# Patient Record
Sex: Female | Born: 1950 | Race: White | Hispanic: No | Marital: Married | Smoking: Never smoker
Health system: Southern US, Community
[De-identification: ages and names within clinical notes are randomized; demographics above are authoritative.]

## PROBLEM LIST (undated history)

## (undated) DIAGNOSIS — K5790 Diverticulosis of intestine, part unspecified, without perforation or abscess without bleeding: Secondary | ICD-10-CM

## (undated) DIAGNOSIS — N814 Uterovaginal prolapse, unspecified: Secondary | ICD-10-CM

## (undated) DIAGNOSIS — Z86010 Personal history of colonic polyps: Secondary | ICD-10-CM

## (undated) DIAGNOSIS — I639 Cerebral infarction, unspecified: Secondary | ICD-10-CM

## (undated) DIAGNOSIS — E7800 Pure hypercholesterolemia, unspecified: Secondary | ICD-10-CM

## (undated) DIAGNOSIS — E119 Type 2 diabetes mellitus without complications: Secondary | ICD-10-CM

## (undated) DIAGNOSIS — K635 Polyp of colon: Secondary | ICD-10-CM

## (undated) DIAGNOSIS — D696 Thrombocytopenia, unspecified: Secondary | ICD-10-CM

## (undated) DIAGNOSIS — D126 Benign neoplasm of colon, unspecified: Secondary | ICD-10-CM

## (undated) DIAGNOSIS — Z860101 Personal history of adenomatous and serrated colon polyps: Secondary | ICD-10-CM

## (undated) HISTORY — PX: DILATION AND CURETTAGE OF UTERUS: SHX78

## (undated) HISTORY — PX: TUBAL LIGATION: SHX77

## (undated) HISTORY — DX: Type 2 diabetes mellitus without complications: E11.9

## (undated) HISTORY — DX: Thrombocytopenia, unspecified: D69.6

## (undated) HISTORY — PX: OTHER SURGICAL HISTORY: SHX169

---
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
## 2004-05-23 ENCOUNTER — Ambulatory Visit: Payer: Self-pay | Admitting: Internal Medicine

## 2004-10-24 ENCOUNTER — Ambulatory Visit: Payer: Self-pay | Admitting: Internal Medicine

## 2004-11-14 ENCOUNTER — Ambulatory Visit: Payer: Self-pay

## 2005-05-28 HISTORY — PX: CHOLECYSTECTOMY: SHX55

## 2005-05-28 HISTORY — PX: FOOT SURGERY: SHX648

## 2005-08-23 ENCOUNTER — Ambulatory Visit: Payer: Self-pay | Admitting: Gastroenterology

## 2005-10-30 ENCOUNTER — Ambulatory Visit: Payer: Self-pay | Admitting: Internal Medicine

## 2006-03-30 ENCOUNTER — Other Ambulatory Visit: Payer: Self-pay

## 2006-03-30 ENCOUNTER — Emergency Department: Payer: Self-pay

## 2006-04-26 ENCOUNTER — Ambulatory Visit: Payer: Self-pay | Admitting: General Surgery

## 2006-11-05 ENCOUNTER — Ambulatory Visit: Payer: Self-pay | Admitting: Internal Medicine

## 2007-05-29 HISTORY — PX: ABDOMINAL HYSTERECTOMY: SHX81

## 2007-06-10 ENCOUNTER — Ambulatory Visit: Payer: Self-pay

## 2007-06-19 ENCOUNTER — Inpatient Hospital Stay: Payer: Self-pay

## 2007-11-07 ENCOUNTER — Ambulatory Visit: Payer: Self-pay | Admitting: Internal Medicine

## 2007-12-15 ENCOUNTER — Ambulatory Visit: Payer: Self-pay | Admitting: Internal Medicine

## 2008-11-19 ENCOUNTER — Ambulatory Visit: Payer: Self-pay | Admitting: Internal Medicine

## 2009-07-25 ENCOUNTER — Ambulatory Visit: Payer: Self-pay | Admitting: Gastroenterology

## 2009-11-23 ENCOUNTER — Ambulatory Visit: Payer: Self-pay | Admitting: Internal Medicine

## 2009-12-05 LAB — HM DIABETES FOOT EXAM

## 2010-01-27 ENCOUNTER — Ambulatory Visit: Payer: Self-pay | Admitting: Internal Medicine

## 2010-12-15 ENCOUNTER — Ambulatory Visit: Payer: Self-pay | Admitting: Internal Medicine

## 2012-03-26 ENCOUNTER — Encounter: Payer: Self-pay | Admitting: Internal Medicine

## 2012-03-26 ENCOUNTER — Ambulatory Visit (INDEPENDENT_AMBULATORY_CARE_PROVIDER_SITE_OTHER): Payer: BC Managed Care – PPO | Admitting: Internal Medicine

## 2012-03-26 VITALS — BP 120/74 | HR 61 | Temp 98.5°F | Ht 65.0 in | Wt 180.0 lb

## 2012-03-26 DIAGNOSIS — Z139 Encounter for screening, unspecified: Secondary | ICD-10-CM

## 2012-03-26 DIAGNOSIS — R5383 Other fatigue: Secondary | ICD-10-CM

## 2012-03-26 DIAGNOSIS — D696 Thrombocytopenia, unspecified: Secondary | ICD-10-CM | POA: Insufficient documentation

## 2012-03-26 DIAGNOSIS — E7800 Pure hypercholesterolemia, unspecified: Secondary | ICD-10-CM | POA: Insufficient documentation

## 2012-03-26 LAB — COMPREHENSIVE METABOLIC PANEL
Albumin: 3.9 g/dL (ref 3.5–5.2)
CO2: 27 mEq/L (ref 19–32)
Calcium: 9.1 mg/dL (ref 8.4–10.5)
Chloride: 107 mEq/L (ref 96–112)
GFR: 93.07 mL/min (ref >60.00)
Glucose, Bld: 89 mg/dL (ref 70–99)
Sodium: 142 mEq/L (ref 135–145)
Total Bilirubin: 0.9 mg/dL (ref 0.3–1.2)
Total Protein: 6.5 g/dL (ref 6.0–8.3)

## 2012-03-26 LAB — CBC WITH DIFFERENTIAL/PLATELET
Eosinophils Relative: 0.4 % (ref 0.0–5.0)
HCT: 40.9 % (ref 36.0–46.0)
Hemoglobin: 13.7 g/dL (ref 12.0–15.0)
Lymphocytes Relative: 27.1 % (ref 12.0–46.0)
Lymphs Abs: 1.3 10*3/uL (ref 0.7–4.0)
Monocytes Relative: 5.7 % (ref 3.0–12.0)
Platelets: 152 10*3/uL (ref 150.0–400.0)
WBC: 4.9 10*3/uL (ref 4.5–10.5)

## 2012-03-26 LAB — TSH: TSH: 0.89 u[IU]/mL (ref 0.35–5.50)

## 2012-03-26 LAB — LIPID PANEL: HDL: 51.6 mg/dL (ref >39.00)

## 2012-03-26 NOTE — Patient Instructions (Signed)
It was nice seeing you today.  I am glad you have been doing well.  I am going to schedule your mammogram.  We will notify you of your lab results once they become available.

## 2012-03-27 ENCOUNTER — Encounter: Payer: Self-pay | Admitting: Internal Medicine

## 2012-03-27 NOTE — Assessment & Plan Note (Signed)
Low cholesterol diet and exercise.  Check lipid panel.   

## 2012-03-27 NOTE — Progress Notes (Signed)
  Subjective:    [name], female    DOB: [birth year], 61 y.o.   MRN: [id number]  HPI 61 year old female with past history of hypercholesterolemia and thrombocytopenia who comes in today for a scheduled follow up.  States she has been doing well.  Staying active.  Has done better eating more protein.  No significant "spells".  Stays active.  No chest pain or tightness with increased activity or exertion.  No nausea or vomiting.  Eating and drinking well.  Bowels stable.  Still with flares.  Due to see Dr Marva Panda next month.   Past Medical History  Diagnosis Date  . Thrombocytopenia     Review of Systems Patient denies any headache, lightheadedness or dizziness.  No chest pain, tightness or palpitations.  No increased shortness of breath, cough or congestion.  No acid reflux.  No nausea or vomiting.  No abdominal pain or cramping.  Bowels stable.  No urine change.        Objective:   Physical Exam Filed Vitals:   03/26/12 0904  BP: 120/74  Pulse: 61  Temp: 98.5 F (61.26 C)   61 year old female in no acute distress.   HEENT:  Nares - clear.  OP- without lesions or erythema.  NECK:  Supple, nontender.  No audible bruit.   HEART:  Appears to be regular. LUNGS:  Without crackles or wheezing audible.  Respirations even and unlabored.   RADIAL PULSE:  Equal bilaterally.  ABDOMEN:  Soft, nontender.  No audible abdominal bruit.   EXTREMITIES:  No increased edema to be present.                     Assessment & Plan:  GI.  Bowels stable.  Still flares.  Takes Colestid (on average - 3x/week).  Due to see Dr Marva Panda next month.  Follow.   FATIGUE.  Did report some fatigue.  Overall feels she is doing well and feels good.  Check cbc, met c and tsh.    HEALTH MAINTENANCE.  Schedule for her physical when due.  Colonoscopy 2011.  Due to follow up with Dr Marva Panda next month.  Check cholesterol.  Schedule her mammogram.  Overdue.

## 2012-03-27 NOTE — Assessment & Plan Note (Signed)
Check cbc today to confirm stable.

## 2012-05-14 ENCOUNTER — Encounter: Payer: Self-pay | Admitting: Internal Medicine

## 2012-05-14 ENCOUNTER — Ambulatory Visit: Payer: Self-pay | Admitting: Internal Medicine

## 2012-06-02 ENCOUNTER — Encounter: Payer: Self-pay | Admitting: Internal Medicine

## 2012-06-03 ENCOUNTER — Telehealth: Payer: Self-pay | Admitting: Internal Medicine

## 2012-06-03 NOTE — Telephone Encounter (Signed)
Patient advised as instructed via telephone. 

## 2012-06-03 NOTE — Telephone Encounter (Signed)
tami flu called in.  Just please notify pt to let us know if any problems.

## 2012-06-03 NOTE — Telephone Encounter (Signed)
Patient Information:  [name] Name: Ivyana  Phone: [phone]  Patient: Victoria Harris, Victoria Harris  Gender: Female  DOB: 11-07-1950  Age: 62 Years  PCP: [facility]  Office Follow Up:  Does the office need to follow up with this patient?: Yes  Instructions For The Office: Please not that patient was assessed to have the symptoms of the flu since Sunday night 06/01/12.  Patient temp is 102 but declines coming out in the cold to be seen.  Patient wanted something called in for Flu-RN used standing orders for Tamiflu.  Please follow up with patient as needed.  Patient was given call back guidelines per protocol.  RN Note:  Patient started with flu like symptoms Sunday night 06/02/11.  She kept hoping that it would pass but it has not.  Has been around grandkids that have been sick but she is not certain they were confirmed for flu.  She did not take the flu shot this year.  Symptoms evolved from chills and body aches to fever as of yesterday that continues today.  Current temperature is 102 orally.  Other symptoms include cough that is mostly dry. Has complained of chest pain, but feels it may be from coughing.  May be coughing up some color but could not tell what.   Denies shortness of breath or emergent symptoms.  Patient does not want to come in for appointment with cold and is requesting medication.  Per Standing Orders (Dr. Lorin Picket), patient called in Tamiflu 75mg  PO bid x 5 days.  Patient's weight is 177.  Notes drug store of choice is [facility] at [phone]--was called into there.  Office notified of treatment so appropriate follow up can be done if needed.  Symptoms  Reason For Call & Symptoms: Started chills and aching.  Yesterday the same but started with fever.  Stuffy nose, and congest cough with color.  Cough hurts right breast. Head feels full.  Reviewed Health History In EMR: Yes  Reviewed Medications In EMR: Yes  Reviewed Allergies In EMR: Yes  Reviewed Surgeries / Procedures:  Yes  Date of Onset of Symptoms: 06/01/2012  Treatments Tried: Tylenol, Delsym,  Treatments Tried Worked: Yes  Any Fever: Yes  Fever Taken: Oral  Fever Time Of Reading: 13:30:00  Fever Last Reading: 102  Guideline(s) Used:  Influenza - Seasonal  Disposition Per Guideline:   Go to Office Now  Reason For Disposition Reached:   Fever > 100.5 F (38.1 C) and over 39 years of age  Advice Given:  Reassurance  For most healthy adults, influenza feels like a bad cold. The dangers of influenza for normal, healthy people (under 13 years of age) are overrated.  The treatment of influenza depends on your main symptoms. Generally, treatment is the same as for other viral respiratory infections (colds). Bed rest is unnecessary.  Here is some care advice that should help.  Treating the Symptoms of Flu  Fever, Muscle Aches, and Headache: For fever more than 101 F (38.3 C), muscle aches, and headaches, take acetaminophen every 4-6 hours (Adults 650 mg) OR ibuprofen every 6-8 hours (Adults 400-600 mg).  Sore Throat: Use throat lozenges, hard candy or warm chicken broth.  Cough: Use cough drops.  Hydrate: Drink extra liquids. If the air in your home is dry, use a humidifier.  No Aspirin  : Do not use aspirin for treatment of fever or pain (Reason: there is an association between influenza and Reye syndrome).  Isolation is Needed Until After  the Fever is Gone:   The CDC recommends that people with influenza-like illness remain at home until at least 24 hours after they are free of fever (100 F or 37.8C).  Do NOT go to work or school.  Do NOT go to church, child care centers, shopping, or other public places.  Do NOT shake hands.  Avoid close contact with others (hugging, kissing).  Expected Course  : The fever lasts 2-3 days, the runny nose 5-10 days, and the cough 2-3 weeks.  Call Back If:  Fever lasts more than 3 days  Runny nose lasts more than 10 days  Cough lasts more than 3 weeks  You  become short of breath or worse.  For a Stuffy Nose - Use Nasal Washes:  Introduction: Saline (salt water) nasal irrigation (nasal wash) is an effective and simple home remedy for treating stuffy nose and sinus congestion. The nose can be irrigated by pouring, spraying, or squirting salt water into the nose and then letting it run back out.  How it Helps: The salt water rinses out excess mucus, washes out any irritants (dust, allergens) that might be present, and moistens the nasal cavity.  Methods: There are several ways to perform nasal irrigation. You can use a saline nasal spray bottle (available over-the-counter), a rubber ear syringe, a medical syringe without the needle, or a Neti Pot.  Patient Refused Recommendation:  Patient Requests Prescription  Tamiful called in for patient: wt is 177 called in 75 mg PO BID x 5 days for patient.

## 2012-06-05 ENCOUNTER — Telehealth: Payer: Self-pay | Admitting: Internal Medicine

## 2012-06-05 NOTE — Telephone Encounter (Signed)
Patient Information:  [name] Name: Keana  Phone: [phone]  Patient: Victoria Harris, Victoria Harris  Gender: Female  DOB: [birth year]  Age: 62 Years  PCP: [facility]   Does the office need to follow up with this patient?: No   RN Note:  last urine @ 0700, drinking fluids, less than normal, temperature lower   Reason For Call & Symptoms: diarrhea with use of Tamilfu  Reviewed Health History In EMR: Yes  Reviewed Medications In EMR: Yes  Reviewed Allergies In EMR: Yes  Reviewed Surgeries / Procedures: Yes  Date of Onset of Symptoms: 06/03/2012  Guideline(s) Used:  Diarrhea  Disposition Per Guideline:  Home Care  Reason For Disposition Reached:  Mild diarrhea  Advice Given:  Reassurance:  In healthy adults, new-onset diarrhea is usually caused by a viral infection of the intestines, which you can treat at home. Diarrhea is the body's way of getting rid of the infection. Here are some tips on how to keep ahead of the fluid losses.  Here is some care advice that should help.  Fluids: Drink more fluids, at least 8-10 glasses (8 oz or 240 ml) daily. For example: sports drinks, diluted fruit juices, soft drinks.  Supplement this with saltine crackers or soups to make certain that you are getting sufficient fluid and salt to meet your body's needs.  Avoid caffeinated beverages (Reason: caffeine is mildly dehydrating).  Nutrition:  Maintaining some food intake during episodes of diarrhea is important.  Ideal initial foods include boiled starches/cereals (e.g., potatoes, rice, noodles, wheat, oats) with a small amount of salt to taste.  Other acceptable foods include: bananas, yogurt, crackers, soup.  Diarrhea Medication:  - Imodium AD:  Helps reduce diarrhea.   Call Back If:  Signs of dehydration occur (e.g., no urine for more than 12 hours, very dry mouth, lightheaded, etc.)  You become worse.

## 2012-06-06 ENCOUNTER — Ambulatory Visit (INDEPENDENT_AMBULATORY_CARE_PROVIDER_SITE_OTHER): Payer: BC Managed Care – PPO | Admitting: Internal Medicine

## 2012-06-06 ENCOUNTER — Encounter: Payer: Self-pay | Admitting: Internal Medicine

## 2012-06-06 ENCOUNTER — Telehealth: Payer: Self-pay | Admitting: Internal Medicine

## 2012-06-06 VITALS — BP 110/60 | HR 70 | Temp 98.0°F | Ht 65.0 in | Wt 177.2 lb

## 2012-06-06 DIAGNOSIS — R197 Diarrhea, unspecified: Secondary | ICD-10-CM

## 2012-06-06 DIAGNOSIS — R509 Fever, unspecified: Secondary | ICD-10-CM

## 2012-06-06 LAB — BASIC METABOLIC PANEL
BUN: 17 mg/dL (ref 6–23)
CO2: 25 mEq/L (ref 19–32)
Calcium: 9.3 mg/dL (ref 8.4–10.5)
GFR: 93.01 mL/min (ref >60.00)
Glucose, Bld: 103 mg/dL — ABNORMAL HIGH (ref 70–99)

## 2012-06-06 LAB — HEPATIC FUNCTION PANEL
Alkaline Phosphatase: 71 U/L (ref 39–117)
Bilirubin, Direct: 0.2 mg/dL (ref 0.0–0.3)
Total Bilirubin: 0.9 mg/dL (ref 0.3–1.2)

## 2012-06-06 LAB — CBC WITH DIFFERENTIAL/PLATELET
Basophils Relative: 0.3 % (ref 0.0–3.0)
Eosinophils Relative: 0.2 % (ref 0.0–5.0)
HCT: 42.3 % (ref 36.0–46.0)
Hemoglobin: 14.3 g/dL (ref 12.0–15.0)
Lymphs Abs: 1.4 10*3/uL (ref 0.7–4.0)
MCV: 91.6 fl (ref 78.0–100.0)
Monocytes Absolute: 0.4 10*3/uL (ref 0.1–1.0)
Monocytes Relative: 5 % (ref 3.0–12.0)
RBC: 4.62 Mil/uL (ref 3.87–5.11)
WBC: 7.4 10*3/uL (ref 4.5–10.5)

## 2012-06-06 NOTE — Telephone Encounter (Signed)
Patient Information:  [name] Name: Victoria Harris  Phone: [phone]  Patient: Victoria Harris, Victoria Harris  Gender: Female  DOB: 1950/06/18  Age: 62 Years  PCP: [facility]  Office Follow Up:  Does the office need to follow up with this patient?: No  Instructions For The Office: N/A  RN Note:  Pt reports that she feels that she needs seen today in the office.  Symptoms  Reason For Call & Symptoms: Hx: Bowel problems; Meds: cholesterol med for bowel control; NKDA; No recent surgeries  Pt is calling and states that she has diarrhea; sx started 06/05/12; called on 06/05/12 and instructed to stay hydrated when she called the office; she has no bowel control that is watery green;  foul odor;  office staff feels that the diarrhea is from the Tamiflu that she was started on 06/03/12;  feeling lightheaded this morning; last time urinated this morning  Reviewed Health History In EMR: Yes  Reviewed Medications In EMR: Yes  Reviewed Allergies In EMR: Yes  Reviewed Surgeries / Procedures: Yes  Date of Onset of Symptoms: 06/03/2012  Treatments Tried: Drinking Gatorade as office staff instructed to do so yesterday, 06/05/12  Treatments Tried Worked: No  Guideline(s) Used:  Diarrhea  Disposition Per Guideline:   Go to Office Now  Reason For Disposition Reached:   Age > 60 years and has had > 6 diarrhea stools in past 24 hours  Advice Given:  Fluids:  Drink more fluids, at least 8-10 glasses (8 oz or 240 ml) daily.  For example: sports drinks, diluted fruit juices, soft drinks.  Supplement this with saltine crackers or soups to make certain that you are getting sufficient fluid and salt to meet your body's needs.  Avoid caffeinated beverages (Reason: caffeine is mildly dehydrating).  Call Back If:  You become worse.  Appointment Scheduled:  06/06/2012 11:00:00 Appointment Scheduled Provider:  Dale 

## 2012-06-07 ENCOUNTER — Encounter: Payer: Self-pay | Admitting: Internal Medicine

## 2012-06-07 ENCOUNTER — Emergency Department: Payer: Self-pay | Admitting: Emergency Medicine

## 2012-06-07 LAB — CBC WITH DIFFERENTIAL/PLATELET
Basophil #: 0 10*3/uL (ref 0.0–0.1)
Basophil %: 0.5 %
Eosinophil #: 0 10*3/uL (ref 0.0–0.7)
Eosinophil %: 0.2 %
HCT: 39.7 % (ref 35.0–47.0)
Lymphocyte %: 18.5 %
MCH: 31.2 pg (ref 26.0–34.0)
MCHC: 34.7 g/dL (ref 32.0–36.0)
MCV: 90 fL (ref 80–100)
Monocyte #: 0.5 x10 3/mm (ref 0.2–0.9)
Monocyte %: 5.8 %
Neutrophil #: 6.2 10*3/uL (ref 1.4–6.5)
Neutrophil %: 75 %
Platelet: 141 10*3/uL — ABNORMAL LOW (ref 150–440)
RBC: 4.41 10*6/uL (ref 3.80–5.20)
RDW: 12.9 % (ref 11.5–14.5)
WBC: 8.3 10*3/uL (ref 3.6–11.0)

## 2012-06-07 LAB — COMPREHENSIVE METABOLIC PANEL
Albumin: 3.6 g/dL (ref 3.4–5.0)
Anion Gap: 9 (ref 7–16)
BUN: 14 mg/dL (ref 7–18)
Bilirubin,Total: 0.9 mg/dL (ref 0.2–1.0)
Calcium, Total: 9 mg/dL (ref 8.5–10.1)
EGFR (African American): >60
EGFR (Non-African Amer.): >60
Glucose: 93 mg/dL (ref 65–99)
Osmolality: 283 (ref 275–301)
Potassium: 3.4 mmol/L — ABNORMAL LOW (ref 3.5–5.1)
SGOT(AST): 43 U/L — ABNORMAL HIGH (ref 15–37)

## 2012-06-07 NOTE — Progress Notes (Signed)
  Subjective:    [name], female    DOB: [birth year], 62 y.o.   MRN: [id number]  HPI 62 year old female with past history of hypercholesterolemia and thrombocytopenia who comes in today as a work in with concerns regarding increased diarrhea.  She states she started 5 nights ago with increased chills.  Then developed fever and body aches.  Called in and was given Tamiflu.  She has been taking this since.  She started having diarrhea a few days ago.  Has been trying to drink gatorade and eat yogurt.  She is drinking fluids, but states everything is going through her.  She has had decreased appetite.  No vomiting.  Has been drinking light blue and green gatorade and after this noticed her stool was green.  No blood in her stool.  She is still having some cough and congestion, but it is starting to break up. tmax in the last 24 hours - 101.5.  Diarrhea persistent.  No abdominal pain or cramping.  Feels weak.    Past Medical History  Diagnosis Date  . Thrombocytopenia     Current Outpatient Prescriptions on File Prior to Visit  Medication Sig Dispense Refill  . colestipol (COLESTID) 1 G tablet Take 1 g by mouth as needed.        Review of Systems Patient denies any headache.  Some weakness.  Minimal lightheadedness.   No significant chest pain, tightness or palpitations.  Had general body aches.  Improved now.  No increased shortness of breath, but does report the increased cough and congestion.  No nausea or vomiting.  No abdominal pain or cramping.  Does report the increased diarrhea.  States everything just "goes through her".  No BRBPR or melana.  No urine change.        Objective:   Physical Exam Filed Vitals:   06/06/12 1107  BP: 110/60  Pulse: 70  Temp: 98 F (36.7 C)   Blood pressure 98/68 lying and 92/68 standing.   62 year old female in no acute distress.   HEENT:  Nares - clear.  OP- without lesions or erythema.  NECK:  Supple, nontender.     HEART:   Appears to be regular. LUNGS:  Without crackles or wheezing audible.  Respirations even and unlabored.  Good breath sounds bilaterally.  RADIAL PULSE:  Equal bilaterally.  ABDOMEN:  Soft, nontender.  No audible abdominal bruit.  Bowel sounds present and normal.      Assessment & Plan:  INFLUENZA.  Being treated with Tamiflu.  Would like for her to complete the full course.  Treat the cough and congestion with Robitussin DM.  Now with increased diarrhea.  No abdominal pain or cramping.  Discussed the importance of staying hydrated.  Bland foods.  Not having any vomiting.  Can use kaopectate as directed.  Check cbc, liver panel and metabolic panel.  If any worsening symptoms or problems, she will need to be reevaluated.   Pt was comfortable with this plan.

## 2012-06-09 ENCOUNTER — Telehealth: Payer: Self-pay | Admitting: Internal Medicine

## 2012-06-09 MED ORDER — ALBUTEROL SULFATE HFA 108 (90 BASE) MCG/ACT IN AERS: 2.0000 | INHALATION_SPRAY | Freq: Four times a day (QID) | RESPIRATORY_TRACT | Status: DC | PRN

## 2012-06-09 NOTE — Telephone Encounter (Signed)
rx sent in for albuterol inhaler.  Pt also instructed to take florastor.

## 2012-06-09 NOTE — Telephone Encounter (Signed)
Patient Information:  [name] Name: Aurther Loft  Phone: [phone]  Patient: Victoria Harris, Victoria Harris  Gender: Female  DOB: 1950/06/11  Age: 62 Years  PCP: [facility]  Office Follow Up:  Does the office need to follow up with this patient?: Yes  Instructions For The Office: OFFICE PLEASE FOLLOW UP WITH PATIENT AFTER DR Lorin Picket IS MADE AWARE OF PATIENT NOT IMPROVING   Symptoms  Reason For Call & Symptoms: caller states pt began with Flu like symptoms on Sun night (06/01/12); she was given Tamiflu on Tuesday (06/03/12).  Pt was not improving and was seen in the office on Friday, 06/06/12.  Pt still had flu symptoms (cough, fever, aches and then also had started diarrhea).  Pt was seen in the ER on Saturday, 06/07/12.  Pt was treated for dehydration and bronchitis.  Pt was given medication to stop diarrhea, cough medication with codeine, tessalon perles and Zpak to start after she finished the tamiflu (06/08/12).  [name] states patients diarrhea has slowed down, but she is still coughing terribly (up from 1am to 6am with non stop coughing).  The cough is a dry cough, but continual.  [name] states he has also began giving patient Mucinex today (06/09/12).  [name] wants to let Dr Lorin Picket know that patient is not worse but not getting any better.  And he is worried because she is not eating and not drinking (only 20 oz in 24 hours).  Reviewed Health History In EMR: Yes  Reviewed Medications In EMR: Yes  Reviewed Allergies In EMR: Yes  Reviewed Surgeries / Procedures: Yes  Date of Onset of Symptoms: 06/01/2012  Treatments Tried: tessalon perles, Zpak, mucinex,  Treatments Tried Worked: No  Guideline(s) Used:  No Protocol Available - Sick Adult  Disposition Per Guideline:   Discuss with PCP and Callback by Nurse Today  Reason For Disposition Reached:   Nursing judgment  Advice Given:  Call Back If:  You become worse.

## 2012-06-09 NOTE — Telephone Encounter (Signed)
Spoke with pt.  She states she is feeling stronger.  Diarrhea has cut down this pm.  Still with some cough.  She went to er - neb helped.  Will add albuterol inhaler - sent in to [facility].  Also add florastor as directed.  Fluids.  Rest.  Call with update.

## 2012-06-09 NOTE — Telephone Encounter (Signed)
Patient stated that she has had diarrhea seven times since 10:30 this morning, she is very weak, she has no nausea or vomiting, no fever, she has been drinking gatoraid and ginger ale, she ate a banana today and a few bites of toast.  Uses [facility] pharmacy.

## 2012-06-09 NOTE — Telephone Encounter (Signed)
Need some clarification:  Previously she was not having issues with vomiting.  If any vomiting now and unable to keep po's down - then would recommend er evaluation.  If no vomiting, then would recommend continuing to push fluids, bland foods, soup, etc. Needs to keep something with her and continue to sip throughout the day.   Just started the zpak.  (last for 10 days).  Per message, has cough med with codeine, tessalon perles and is now using mucinex.  Is she taking these regularly? If no, needs to take regularly.  If any worsening, will need evaluation.

## 2012-06-27 ENCOUNTER — Encounter: Payer: Self-pay | Admitting: Internal Medicine

## 2012-06-27 ENCOUNTER — Ambulatory Visit (INDEPENDENT_AMBULATORY_CARE_PROVIDER_SITE_OTHER): Payer: BC Managed Care – PPO | Admitting: Internal Medicine

## 2012-06-27 ENCOUNTER — Other Ambulatory Visit (HOSPITAL_COMMUNITY)
Admission: RE | Admit: 2012-06-27 | Discharge: 2012-06-27 | Disposition: A | Discharge status: Home / Self Care | Payer: BC Managed Care – PPO | Source: Ambulatory Visit | Attending: Internal Medicine | Admitting: Internal Medicine

## 2012-06-27 VITALS — BP 124/78 | HR 61 | Temp 98.3°F | Ht 65.0 in | Wt 176.8 lb

## 2012-06-27 DIAGNOSIS — D696 Thrombocytopenia, unspecified: Secondary | ICD-10-CM

## 2012-06-27 DIAGNOSIS — Z0000 Encounter for general adult medical examination without abnormal findings: Secondary | ICD-10-CM

## 2012-06-27 DIAGNOSIS — Z01419 Encounter for gynecological examination (general) (routine) without abnormal findings: Secondary | ICD-10-CM | POA: Insufficient documentation

## 2012-06-27 DIAGNOSIS — E7800 Pure hypercholesterolemia, unspecified: Secondary | ICD-10-CM

## 2012-06-27 DIAGNOSIS — Z1151 Encounter for screening for human papillomavirus (HPV): Secondary | ICD-10-CM | POA: Insufficient documentation

## 2012-06-27 DIAGNOSIS — R945 Abnormal results of liver function studies: Secondary | ICD-10-CM

## 2012-06-27 LAB — HEPATIC FUNCTION PANEL
ALT: 39 U/L — ABNORMAL HIGH (ref 0–35)
AST: 33 U/L (ref 0–37)
Albumin: 4 g/dL (ref 3.5–5.2)
Alkaline Phosphatase: 84 U/L (ref 39–117)
Bilirubin, Direct: 0.1 mg/dL (ref 0.0–0.3)
Total Protein: 6.5 g/dL (ref 6.0–8.3)

## 2012-06-27 NOTE — Progress Notes (Signed)
  Subjective:    [name], female    DOB: Aug 18, 1950, 62 y.o.   MRN: [id number]  HPI 62 year old female with past history of hypercholesterolemia and thrombocytopenia who comes in today to follow up on these issues as well as for a complete physical exam.  Doing much better now.  Recently had the flu.  Bowels back to normal.  Feels like she is back to her baseline.  No cardiac symptoms with increased activity or exertion.  Breathing stable.  No nausea or vomiting.    Past Medical History  Diagnosis Date  . Thrombocytopenia     Review of Systems Patient denies any headache, lightheadedness or dizziness.  No sinus or allergy symptoms.  No chest pain, tightness or palpitations.  No increased shortness of breath, cough or congestion.  No acid reflux.  No nausea or vomiting.  No abdominal pain or cramping.  Bowels stable.  No urine change.  Feels much better.  Energy improved.  Back to her baseline.        Objective:   Physical Exam  Filed Vitals:   06/27/12 0821  BP: 124/78  Pulse: 61  Temp: 98.3 F (57.30 C)   62 year old female in no acute distress.   HEENT:  Nares- clear.  Oropharynx - without lesions. NECK:  Supple.  Nontender.  No audible bruit.  HEART:  Appears to be regular. LUNGS:  No crackles or wheezing audible.  Respirations even and unlabored.  RADIAL PULSE:  Equal bilaterally.    BREASTS:  No nipple discharge or nipple retraction present.  Could not appreciate any distinct nodules or axillary adenopathy.  ABDOMEN:  Soft, nontender.  Bowel sounds present and normal.  No audible abdominal bruit.  GU:  Normal external genitalia.  Vaginal vault without lesions.  S/p hysterectomy.  Pap performed. Could not appreciate any adnexal masses or tenderness.   RECTAL:  Heme negative.  Decreased sphincter tone.   EXTREMITIES:  No increased edema present.  DP pulses palpable and equal bilaterally.              Assessment & Plan:  GI.  Bowels back to her baseline.   Takes Colestid (on average - 3x/week).   Was evaluated in GI recently.  Planning for evaluation at [facility] 07/14/12.  ABNORMAL LIVER FUNCTION.  Slight elevation when she was sick.  Recheck today to confirm normal.      HEALTH MAINTENANCE.  Physical today.  Pap performed.   Colonoscopy 2011.  Cholesterol just checked and within normal limits.  Mammogram 05/14/12 - BiRADS II.

## 2012-06-29 ENCOUNTER — Other Ambulatory Visit: Payer: Self-pay | Admitting: Internal Medicine

## 2012-06-29 ENCOUNTER — Encounter: Payer: Self-pay | Admitting: Internal Medicine

## 2012-06-29 DIAGNOSIS — R945 Abnormal results of liver function studies: Secondary | ICD-10-CM

## 2012-06-29 NOTE — Assessment & Plan Note (Signed)
Platelet count stable.  Follow cbc.  

## 2012-06-29 NOTE — Assessment & Plan Note (Signed)
Cholesterol just checked and wnl.  Follow.

## 2012-06-29 NOTE — Progress Notes (Signed)
Scheduled follow up labs.

## 2012-08-25 ENCOUNTER — Other Ambulatory Visit (INDEPENDENT_AMBULATORY_CARE_PROVIDER_SITE_OTHER): Payer: BC Managed Care – PPO

## 2012-08-25 DIAGNOSIS — R945 Abnormal results of liver function studies: Secondary | ICD-10-CM

## 2012-08-25 DIAGNOSIS — K769 Liver disease, unspecified: Secondary | ICD-10-CM

## 2012-08-25 LAB — HEPATIC FUNCTION PANEL
Albumin: 4.2 g/dL (ref 3.5–5.2)
Alkaline Phosphatase: 58 U/L (ref 39–117)
Total Protein: 7 g/dL (ref 6.0–8.3)

## 2012-08-26 ENCOUNTER — Encounter: Payer: Self-pay | Admitting: Internal Medicine

## 2012-09-16 ENCOUNTER — Encounter: Payer: Self-pay | Admitting: Gastroenterology

## 2012-09-25 ENCOUNTER — Encounter: Payer: Self-pay | Admitting: Gastroenterology

## 2012-10-26 ENCOUNTER — Encounter: Payer: Self-pay | Admitting: Gastroenterology

## 2012-12-25 ENCOUNTER — Ambulatory Visit: Payer: BC Managed Care – PPO | Admitting: Internal Medicine

## 2013-01-06 ENCOUNTER — Encounter: Payer: Self-pay | Admitting: Internal Medicine

## 2013-01-06 ENCOUNTER — Ambulatory Visit (INDEPENDENT_AMBULATORY_CARE_PROVIDER_SITE_OTHER): Payer: BC Managed Care – PPO | Admitting: Internal Medicine

## 2013-01-06 VITALS — BP 130/90 | HR 72 | Temp 98.5°F | Ht 65.0 in | Wt 175.5 lb

## 2013-01-06 DIAGNOSIS — D696 Thrombocytopenia, unspecified: Secondary | ICD-10-CM

## 2013-01-06 DIAGNOSIS — E7800 Pure hypercholesterolemia, unspecified: Secondary | ICD-10-CM

## 2013-01-06 LAB — CBC WITH DIFFERENTIAL/PLATELET
Basophils Absolute: 0 10*3/uL (ref 0.0–0.1)
Eosinophils Absolute: 0.1 10*3/uL (ref 0.0–0.7)
Hemoglobin: 15 g/dL (ref 12.0–15.0)
Lymphocytes Relative: 24.7 % (ref 12.0–46.0)
MCHC: 33.2 g/dL (ref 30.0–36.0)
MCV: 94.8 fl (ref 78.0–100.0)
Monocytes Absolute: 0.4 10*3/uL (ref 0.1–1.0)
Neutro Abs: 5.3 10*3/uL (ref 1.4–7.7)
RDW: 13.6 % (ref 11.5–14.6)

## 2013-01-07 ENCOUNTER — Encounter: Payer: Self-pay | Admitting: *Deleted

## 2013-01-08 ENCOUNTER — Encounter: Payer: Self-pay | Admitting: Internal Medicine

## 2013-01-08 NOTE — Progress Notes (Signed)
  Subjective:    [name], female    DOB: 1951-01-13, 62 y.o.   MRN: [id number]  HPI 62 year old female with past history of hypercholesterolemia and thrombocytopenia who comes in today for a scheduled follow up.  No cardiac symptoms with increased activity or exertion.  Breathing stable.  No nausea or vomiting.  Bowels better.   Taking a probiotic.  Still takes colestid.  Seeing GI.  Had a test at [facility] as well.  Decreased rectal muscle tone.  Did go to therapy.  Overall better.  Feels she is doing well.  Eating and drinking well.     Past Medical History  Diagnosis Date  . Thrombocytopenia     Current Outpatient Prescriptions on File Prior to Visit  Medication Sig Dispense Refill  . colestipol (COLESTID) 1 G tablet Take 1 g by mouth as needed.       No current facility-administered medications on file prior to visit.    Review of Systems Patient denies any headache, lightheadedness or dizziness.  No sinus or allergy symptoms.  No chest pain, tightness or palpitations.  No increased shortness of breath, cough or congestion.  No acid reflux.  No nausea or vomiting.  No abdominal pain or cramping.  Bowels better.  No urine change.         Objective:   Physical Exam  Filed Vitals:   01/06/13 1125  BP: 130/90  Pulse: 72  Temp: 98.5 F (1.7 C)   62 year old female in no acute distress.   HEENT:  Nares- clear.  Oropharynx - without lesions. NECK:  Supple.  Nontender.  No audible bruit.  HEART:  Appears to be regular. LUNGS:  No crackles or wheezing audible.  Respirations even and unlabored.  RADIAL PULSE:  Equal bilaterally.  ABDOMEN:  Soft, nontender.  Bowel sounds present and normal.  No audible abdominal bruit.  EXTREMITIES:  No increased edema present.  DP pulses palpable and equal bilaterally.              Assessment & Plan:  GI.  Bowels better. Takes Colestid (on average - 3x/week).   Was evaluated in GI.  Also referred to [facility].  Decrease muscle tone.   Bowels better on the probiotic.  Follow.   ABNORMAL LIVER FUNCTION.  Slight elevation when she was sick.  Recheck  normal.     ELEVATED BLOOD PRESSURE.  Slight elevation today.  Follow.     HEALTH MAINTENANCE.  Physical 06/27/12.  Pap normal.  Colonoscopy 2011.  Cholesterol just checked and within normal limits.  Mammogram 05/14/12 - BiRADS II.

## 2013-01-08 NOTE — Assessment & Plan Note (Signed)
Cholesterol just checked and wnl.  Follow.

## 2013-01-08 NOTE — Assessment & Plan Note (Signed)
Platelet count stable.  Follow cbc.  Check today.    

## 2013-05-18 ENCOUNTER — Ambulatory Visit: Payer: Self-pay | Admitting: Internal Medicine

## 2013-05-19 ENCOUNTER — Encounter: Payer: Self-pay | Admitting: Internal Medicine

## 2013-05-25 ENCOUNTER — Ambulatory Visit: Payer: Self-pay | Admitting: Internal Medicine

## 2013-05-26 ENCOUNTER — Encounter: Payer: Self-pay | Admitting: Internal Medicine

## 2013-07-02 ENCOUNTER — Ambulatory Visit (INDEPENDENT_AMBULATORY_CARE_PROVIDER_SITE_OTHER): Payer: BC Managed Care – PPO | Admitting: Internal Medicine

## 2013-07-02 ENCOUNTER — Encounter: Payer: Self-pay | Admitting: Internal Medicine

## 2013-07-02 VITALS — BP 120/80 | HR 68 | Temp 98.1°F | Ht 65.5 in | Wt 183.5 lb

## 2013-07-02 DIAGNOSIS — S92902A Unspecified fracture of left foot, initial encounter for closed fracture: Secondary | ICD-10-CM

## 2013-07-02 DIAGNOSIS — S92909A Unspecified fracture of unspecified foot, initial encounter for closed fracture: Secondary | ICD-10-CM

## 2013-07-02 DIAGNOSIS — D696 Thrombocytopenia, unspecified: Secondary | ICD-10-CM

## 2013-07-02 DIAGNOSIS — E7800 Pure hypercholesterolemia, unspecified: Secondary | ICD-10-CM

## 2013-07-02 NOTE — Progress Notes (Signed)
  Subjective:    [name], female    DOB: 1951-04-30, 63 y.o.   MRN: [id number]  HPI 63 year old female with past history of hypercholesterolemia and thrombocytopenia who comes in today for her physical exam.  No cardiac symptoms with increased activity or exertion.  Breathing stable.  No nausea or vomiting.  Bowels better.   Taking Florastor.  Still takes colestid.  Had a test at [facility] as well.  Decreased rectal muscle tone.  Did go to therapy.  Overall better.  Feels she is doing well.  Eating and drinking well.  Her left foot was swollen.  Saw Dr Elvina Mattes.  Had hairline fracture.  Wore a boot.  Better now.     Past Medical History  Diagnosis Date  . Thrombocytopenia     Current Outpatient Prescriptions on File Prior to Visit  Medication Sig Dispense Refill  . colestipol (COLESTID) 1 G tablet Take 1 g by mouth as needed.       No current facility-administered medications on file prior to visit.    Review of Systems Patient denies any headache, lightheadedness or dizziness.  No sinus or allergy symptoms.  No chest pain, tightness or palpitations.  No increased shortness of breath, cough or congestion.  No acid reflux.  No nausea or vomiting.  No abdominal pain or cramping.  Bowels better.  No urine change.         Objective:   Physical Exam  Filed Vitals:   07/02/13 1022  BP: 120/80  Pulse: 68  Temp: 98.1 F (36.7 C)   Blood pressure recheck:  64/18  63 year old female in no acute distress.   HEENT:  Nares- clear.  Oropharynx - without lesions. NECK:  Supple.  Nontender.  No audible bruit.  HEART:  Appears to be regular. LUNGS:  No crackles or wheezing audible.  Respirations even and unlabored.  RADIAL PULSE:  Equal bilaterally.    BREASTS:  No nipple discharge or nipple retraction present.  Could not appreciate any distinct nodules or axillary adenopathy.  ABDOMEN:  Soft, nontender.  Bowel sounds present and normal.  No audible abdominal bruit.  GU:   Not performed.     EXTREMITIES:  No increased edema present.  DP pulses palpable and equal bilaterally.             Assessment & Plan:  GI.  Bowels better. Takes Colestid (on average - 3x/week).   Was evaluated in GI.  Also referred to [facility].  Decrease muscle tone.  Bowels better on the probiotic.  Follow.   ABNORMAL LIVER FUNCTION.  Slight elevation when she was sick.  Recheck  normal.     HEALTH MAINTENANCE.  Physical today.  Pap 06/27/12 - negative with negative HPV.   Colonoscopy 2011.   Mammogram 05/18/13 recommended f/u views.  F/u mammo ok.

## 2013-07-02 NOTE — Progress Notes (Signed)
Pre-visit discussion using our clinic review tool. No additional management support is needed unless otherwise documented below in the visit note.  

## 2013-07-07 ENCOUNTER — Encounter: Payer: Self-pay | Admitting: Internal Medicine

## 2013-07-07 DIAGNOSIS — S92902A Unspecified fracture of left foot, initial encounter for closed fracture: Secondary | ICD-10-CM | POA: Insufficient documentation

## 2013-07-07 NOTE — Assessment & Plan Note (Signed)
Platelet count stable.  Follow cbc.  Check today.

## 2013-07-07 NOTE — Assessment & Plan Note (Signed)
Previous foot swelling and hairline fracture.  Wore a boot.  Better now.  Follow.

## 2013-07-07 NOTE — Assessment & Plan Note (Signed)
Low cholesterol diet.  Check lipid panel.    

## 2013-07-08 ENCOUNTER — Other Ambulatory Visit: Payer: BC Managed Care – PPO

## 2013-07-16 ENCOUNTER — Other Ambulatory Visit (INDEPENDENT_AMBULATORY_CARE_PROVIDER_SITE_OTHER): Payer: BC Managed Care – PPO

## 2013-07-16 DIAGNOSIS — D696 Thrombocytopenia, unspecified: Secondary | ICD-10-CM

## 2013-07-16 DIAGNOSIS — E7800 Pure hypercholesterolemia, unspecified: Secondary | ICD-10-CM

## 2013-07-16 LAB — CBC WITH DIFFERENTIAL/PLATELET
BASOS PCT: 0.5 % (ref 0.0–3.0)
Basophils Absolute: 0 10*3/uL (ref 0.0–0.1)
Eosinophils Absolute: 0.1 10*3/uL (ref 0.0–0.7)
Eosinophils Relative: 1.3 % (ref 0.0–5.0)
HCT: 42.6 % (ref 36.0–46.0)
HEMOGLOBIN: 14.3 g/dL (ref 12.0–15.0)
LYMPHS PCT: 29.7 % (ref 12.0–46.0)
Lymphs Abs: 1.5 10*3/uL (ref 0.7–4.0)
MCHC: 33.6 g/dL (ref 30.0–36.0)
MCV: 94.2 fl (ref 78.0–100.0)
MONOS PCT: 5.4 % (ref 3.0–12.0)
Monocytes Absolute: 0.3 10*3/uL (ref 0.1–1.0)
NEUTROS ABS: 3.3 10*3/uL (ref 1.4–7.7)
Neutrophils Relative %: 63.1 % (ref 43.0–77.0)
Platelets: 169 10*3/uL (ref 150.0–400.0)
RBC: 4.52 Mil/uL (ref 3.87–5.11)
RDW: 13 % (ref 11.5–14.6)
WBC: 5.2 10*3/uL (ref 4.5–10.5)

## 2013-07-16 LAB — COMPREHENSIVE METABOLIC PANEL
ALT: 25 U/L (ref 0–35)
AST: 21 U/L (ref 0–37)
Albumin: 4.1 g/dL (ref 3.5–5.2)
Alkaline Phosphatase: 47 U/L (ref 39–117)
BUN: 17 mg/dL (ref 6–23)
CALCIUM: 9.5 mg/dL (ref 8.4–10.5)
CHLORIDE: 107 meq/L (ref 96–112)
CO2: 25 meq/L (ref 19–32)
CREATININE: 0.7 mg/dL (ref 0.4–1.2)
GFR: 95.93 mL/min (ref >60.00)
GLUCOSE: 99 mg/dL (ref 70–99)
Potassium: 4.2 mEq/L (ref 3.5–5.1)
Sodium: 139 mEq/L (ref 135–145)
Total Bilirubin: 0.5 mg/dL (ref 0.3–1.2)
Total Protein: 6.6 g/dL (ref 6.0–8.3)

## 2013-07-16 LAB — LIPID PANEL
CHOL/HDL RATIO: 3
CHOLESTEROL: 188 mg/dL (ref 0–200)
HDL: 56.2 mg/dL (ref >39.00)
LDL CALC: 105 mg/dL — AB (ref 0–99)
TRIGLYCERIDES: 135 mg/dL (ref 0.0–149.0)
VLDL: 27 mg/dL (ref 0.0–40.0)

## 2013-07-16 LAB — TSH: TSH: 1.37 u[IU]/mL (ref 0.35–5.50)

## 2013-07-17 ENCOUNTER — Encounter: Payer: Self-pay | Admitting: *Deleted

## 2013-08-04 ENCOUNTER — Encounter: Payer: Self-pay | Admitting: Internal Medicine

## 2013-08-25 ENCOUNTER — Telehealth: Payer: Self-pay | Admitting: Internal Medicine

## 2013-08-25 NOTE — Telephone Encounter (Signed)
I would recommend her hold on getting the shingles vaccine - wait until asymptomatic.  Has she definitely had chicken pox.  If so, I can write rx - but would wait to get until asymptomatic.

## 2013-08-25 NOTE — Telephone Encounter (Signed)
Pt has contacted her insurance regarding shingles vaccine.  States she has to get it at the pharmacy.  Will be going to [facility]. Asking if she can pick up the prescription for [place] so that she can take it to the pharmacy when she is ready.  Also states she is having allergy symptoms, does not think she is sick, just sneezing.  Asking if it is okay to receive shingles vaccine with symptoms.  Pt would like to pick up this afternoon.  Advised pt we would call her if/when ready.

## 2013-08-26 NOTE — Telephone Encounter (Signed)
Rx placed upfront 

## 2013-08-26 NOTE — Telephone Encounter (Signed)
Pt called to check status of shingles vaccine.  Advised pt of previous ph note.  Pt states she would like to come this afternoon to pick up script.

## 2014-01-01 ENCOUNTER — Encounter (INDEPENDENT_AMBULATORY_CARE_PROVIDER_SITE_OTHER): Payer: Self-pay

## 2014-01-01 ENCOUNTER — Encounter: Payer: Self-pay | Admitting: Internal Medicine

## 2014-01-01 ENCOUNTER — Ambulatory Visit (INDEPENDENT_AMBULATORY_CARE_PROVIDER_SITE_OTHER): Payer: BC Managed Care – PPO | Admitting: Internal Medicine

## 2014-01-01 VITALS — BP 120/80 | HR 75 | Temp 98.1°F | Ht 65.5 in | Wt 184.0 lb

## 2014-01-01 DIAGNOSIS — S92902S Unspecified fracture of left foot, sequela: Secondary | ICD-10-CM

## 2014-01-01 DIAGNOSIS — D696 Thrombocytopenia, unspecified: Secondary | ICD-10-CM

## 2014-01-01 DIAGNOSIS — S8290XS Unspecified fracture of unspecified lower leg, sequela: Secondary | ICD-10-CM

## 2014-01-01 DIAGNOSIS — M79609 Pain in unspecified limb: Secondary | ICD-10-CM

## 2014-01-01 DIAGNOSIS — E7800 Pure hypercholesterolemia, unspecified: Secondary | ICD-10-CM

## 2014-01-01 DIAGNOSIS — M79672 Pain in left foot: Secondary | ICD-10-CM

## 2014-01-01 NOTE — Progress Notes (Signed)
Pre visit review using our clinic review tool, if applicable. No additional management support is needed unless otherwise documented below in the visit note. 

## 2014-01-01 NOTE — Progress Notes (Signed)
  Subjective:    [name], female    DOB: 1950-09-03, 63 y.o.   MRN: [id number]  HPI 63 year old female with past history of hypercholesterolemia and thrombocytopenia who comes in today for a scheduled follow up.   No cardiac symptoms with increased activity or exertion.  Breathing stable.  No nausea or vomiting.  Bowels better.   Taking Florastor.  Still takes colestid.  Had a test at [facility] as well.  Decreased rectal muscle tone.  Did go to therapy.  Overall better.  Feels she is doing well.  Eating and drinking well.  She has been under increased stress with her husband's medical issues.  Feels she is handling things relatively well.  She does report increased left heel pain.  Feels bruised.  Started mid July.  Hurts to walk.       Past Medical History  Diagnosis Date  . Thrombocytopenia     Current Outpatient Prescriptions on File Prior to Visit  Medication Sig Dispense Refill  . colestipol (COLESTID) 1 G tablet Take 1 g by mouth as needed.      . Saccharomyces boulardii (FLORASTOR PO) Take by mouth daily as needed.       No current facility-administered medications on file prior to visit.    Review of Systems Patient denies any headache, lightheadedness or dizziness.  No sinus or allergy symptoms.  No chest pain, tightness or palpitations.  No increased shortness of breath, cough or congestion.  No acid reflux.  No nausea or vomiting.  No abdominal pain or cramping.  Bowels better.  No urine change.  Left heel pain as outlined.         Objective:   Physical Exam  Filed Vitals:   01/01/14 1009  BP: 120/80  Pulse: 75  Temp: 98.1 F (24.69 C)   63 year old female in no acute distress.   HEENT:  Nares- clear.  Oropharynx - without lesions. NECK:  Supple.  Nontender.  No audible bruit.  HEART:  Appears to be regular. LUNGS:  No crackles or wheezing audible.  Respirations even and unlabored.  RADIAL PULSE:  Equal bilaterally.  ABDOMEN:  Soft, nontender.   Bowel sounds present and normal.  No audible abdominal bruit.     EXTREMITIES:  No increased edema present.  DP pulses palpable and equal bilaterally.             Assessment & Plan:  GI.  Bowels better. Takes Colestid (on average - 3x/week).   On Florastor.  Was evaluated in GI.  Also referred to [facility].  Decrease muscle tone.  Bowels better on the probiotic.  Follow.   ABNORMAL LIVER FUNCTION.  Slight elevation when she was sick.  Recheck  normal.     HEALTH MAINTENANCE.  Physical 07/02/13.  Pap 06/27/12 - negative with negative HPV.   Colonoscopy 2011.   Mammogram 05/18/13 recommended f/u views.  F/u mammo ok.

## 2014-01-03 ENCOUNTER — Encounter: Payer: Self-pay | Admitting: Internal Medicine

## 2014-01-03 DIAGNOSIS — M79609 Pain in unspecified limb: Secondary | ICD-10-CM | POA: Insufficient documentation

## 2014-01-03 DIAGNOSIS — M79672 Pain in left foot: Secondary | ICD-10-CM | POA: Insufficient documentation

## 2014-01-03 NOTE — Assessment & Plan Note (Signed)
Low cholesterol diet.  Lipid panel better.  Follow.

## 2014-01-03 NOTE — Assessment & Plan Note (Signed)
Follow cbc.  Last platelet count wnl.   

## 2014-01-03 NOTE — Assessment & Plan Note (Signed)
Healed

## 2014-01-03 NOTE — Assessment & Plan Note (Signed)
Increased pain to palpation of left heel.  Will refer to podiatry for further evaluation and treatment.  Heel supports.

## 2014-05-18 ENCOUNTER — Telehealth: Payer: Self-pay | Admitting: Internal Medicine

## 2014-05-25 ENCOUNTER — Encounter: Payer: Self-pay | Admitting: Nurse Practitioner

## 2014-05-25 ENCOUNTER — Ambulatory Visit (INDEPENDENT_AMBULATORY_CARE_PROVIDER_SITE_OTHER): Payer: BC Managed Care – PPO | Admitting: Nurse Practitioner

## 2014-05-25 VITALS — BP 118/58 | HR 70 | Temp 98.2°F | Resp 12 | Ht 65.5 in | Wt 181.4 lb

## 2014-05-25 DIAGNOSIS — N644 Mastodynia: Secondary | ICD-10-CM

## 2014-05-25 NOTE — Assessment & Plan Note (Addendum)
Intermittent right breast pain with past hx of negative Mammograms. No masses on breast exam and dense breast tissue felt bilaterally. Obtain US breast complete Right include Axilla as well as diagnostic mammogram. FU prn. Sees Dr. Nicki Reaper in Feb.

## 2014-05-25 NOTE — Progress Notes (Signed)
   Subjective:    [name], female    DOB: 1951/05/14, 63 y.o.   MRN: [id number]  HPI  Victoria Harris is a 63 yo female with intermittent right breast pain x 3 weeks.    1) Mammogram in 04/2013 normal   Right breast, pain intermittent, pain described as throb, lasts less than 30 min each time. Last episode today. Goes off and on each day, 4/10 in severity at worst. Right upper axillary area of breast. Denies feeling a mass. Hx of dense breast tissue, denies surgery or past breast problems.    Review of Systems  Constitutional: Negative for fever, chills, diaphoresis and fatigue.  Skin: Negative for color change, pallor, rash and wound.   Past Medical History  Diagnosis Date  . Thrombocytopenia     History   Social History  . Marital Status: Married    Spouse Name: N/A    Number of Children: N/A  . Years of Education: N/A   Occupational History  . Not on file.   Social History Main Topics  . Smoking status: Never Smoker   . Smokeless tobacco: Never Used  . Alcohol Use: No  . Drug Use: No  . Sexual Activity: Not on file   Other Topics Concern  . Not on file   Social History Narrative    Past Surgical History  Procedure Laterality Date  . Cholecystectomy  2007  . Tonsillectomy  1960  . Abdominal hysterectomy  2009  . Foot surgery  2007  . Dilation and curettage of uterus  2001 and 2006  . Vocal cord cyst removed      Family History  Problem Relation Age of Onset  . Diabetes    . Colon cancer Mother   . Stroke Paternal Grandmother   . Stroke Paternal Grandfather   . Prostate cancer Father   . Breast cancer Paternal Aunt     x2  . Uterine cancer Maternal Aunt     Allergies  Allergen Reactions  . No Known Drug Allergy     Current Outpatient Prescriptions on File Prior to Visit  Medication Sig Dispense Refill  . colestipol (COLESTID) 1 G tablet Take 1 g by mouth as needed.    . Saccharomyces boulardii (FLORASTOR PO) Take by  mouth daily as needed.     No current facility-administered medications on file prior to visit.       Objective:   Physical Exam  Pulmonary/Chest: Right breast exhibits tenderness. Right breast exhibits no inverted nipple, no mass, no nipple discharge and no skin change. Left breast exhibits no inverted nipple, no mass, no nipple discharge, no skin change and no tenderness. Breasts are symmetrical.      BP 118/58 mmHg  Pulse 70  Temp(Src) 98.2 F (36.8 C) (Oral)  Resp 12  Ht 5' 5.5" (1.664 m)  Wt 181 lb 6.4 oz (82.283 kg)  BMI 29.72 kg/m2  SpO2 95%      Assessment & Plan:

## 2014-05-25 NOTE — Patient Instructions (Signed)
We will contact you about your breast studies.  Mammogram and a right breast ultrasound.   Call us if you need [place].   Happy New Year!

## 2014-05-25 NOTE — Progress Notes (Signed)
Pre visit review using our clinic review tool, if applicable. No additional management support is needed unless otherwise documented below in the visit note. 

## 2014-06-09 ENCOUNTER — Ambulatory Visit: Payer: Self-pay | Admitting: Nurse Practitioner

## 2014-06-09 LAB — HM MAMMOGRAPHY: HM Mammogram: NEGATIVE

## 2014-07-05 ENCOUNTER — Encounter: Payer: Self-pay | Admitting: Internal Medicine

## 2014-07-05 ENCOUNTER — Ambulatory Visit (INDEPENDENT_AMBULATORY_CARE_PROVIDER_SITE_OTHER): Payer: BLUE CROSS/BLUE SHIELD | Admitting: Internal Medicine

## 2014-07-05 ENCOUNTER — Other Ambulatory Visit: Payer: Self-pay | Admitting: Internal Medicine

## 2014-07-05 VITALS — BP 120/80 | HR 65 | Temp 98.1°F | Ht 65.5 in | Wt 184.2 lb

## 2014-07-05 DIAGNOSIS — D696 Thrombocytopenia, unspecified: Secondary | ICD-10-CM

## 2014-07-05 DIAGNOSIS — R7989 Other specified abnormal findings of blood chemistry: Secondary | ICD-10-CM

## 2014-07-05 DIAGNOSIS — Z0000 Encounter for general adult medical examination without abnormal findings: Secondary | ICD-10-CM

## 2014-07-05 DIAGNOSIS — R55 Syncope and collapse: Secondary | ICD-10-CM

## 2014-07-05 DIAGNOSIS — E7800 Pure hypercholesterolemia, unspecified: Secondary | ICD-10-CM

## 2014-07-05 DIAGNOSIS — R739 Hyperglycemia, unspecified: Secondary | ICD-10-CM

## 2014-07-05 DIAGNOSIS — Z6830 Body mass index (BMI) 30.0-30.9, adult: Secondary | ICD-10-CM | POA: Insufficient documentation

## 2014-07-05 DIAGNOSIS — R945 Abnormal results of liver function studies: Secondary | ICD-10-CM

## 2014-07-05 DIAGNOSIS — E669 Obesity, unspecified: Secondary | ICD-10-CM

## 2014-07-05 DIAGNOSIS — M722 Plantar fascial fibromatosis: Secondary | ICD-10-CM | POA: Insufficient documentation

## 2014-07-05 LAB — LIPID PANEL
CHOL/HDL RATIO: 3
Cholesterol: 201 mg/dL — ABNORMAL HIGH (ref 0–200)
HDL: 70.8 mg/dL (ref >39.00)
LDL Cholesterol: 108 mg/dL — ABNORMAL HIGH (ref 0–99)
NONHDL: 130.2
Triglycerides: 109 mg/dL (ref 0.0–149.0)
VLDL: 21.8 mg/dL (ref 0.0–40.0)

## 2014-07-05 LAB — COMPREHENSIVE METABOLIC PANEL
ALK PHOS: 57 U/L (ref 39–117)
ALT: 60 U/L — ABNORMAL HIGH (ref 0–35)
AST: 49 U/L — ABNORMAL HIGH (ref 0–37)
Albumin: 4.1 g/dL (ref 3.5–5.2)
BUN: 15 mg/dL (ref 6–23)
CALCIUM: 9.5 mg/dL (ref 8.4–10.5)
CHLORIDE: 105 meq/L (ref 96–112)
CO2: 27 mEq/L (ref 19–32)
Creatinine, Ser: 0.65 mg/dL (ref 0.40–1.20)
GFR: 97.34 mL/min (ref >60.00)
Glucose, Bld: 109 mg/dL — ABNORMAL HIGH (ref 70–99)
POTASSIUM: 4 meq/L (ref 3.5–5.1)
Sodium: 140 mEq/L (ref 135–145)
Total Bilirubin: 0.8 mg/dL (ref 0.2–1.2)
Total Protein: 6.4 g/dL (ref 6.0–8.3)

## 2014-07-05 LAB — CBC WITH DIFFERENTIAL/PLATELET
Basophils Absolute: 0 10*3/uL (ref 0.0–0.1)
Basophils Relative: 0.4 % (ref 0.0–3.0)
Eosinophils Absolute: 0.1 10*3/uL (ref 0.0–0.7)
Eosinophils Relative: 1.4 % (ref 0.0–5.0)
HCT: 39.7 % (ref 36.0–46.0)
HEMOGLOBIN: 14 g/dL (ref 12.0–15.0)
LYMPHS ABS: 1.5 10*3/uL (ref 0.7–4.0)
LYMPHS PCT: 26.8 % (ref 12.0–46.0)
MCHC: 35.2 g/dL (ref 30.0–36.0)
MCV: 90.2 fl (ref 78.0–100.0)
MONO ABS: 0.3 10*3/uL (ref 0.1–1.0)
Monocytes Relative: 5.7 % (ref 3.0–12.0)
Neutro Abs: 3.6 10*3/uL (ref 1.4–7.7)
Neutrophils Relative %: 65.7 % (ref 43.0–77.0)
PLATELETS: 147 10*3/uL — AB (ref 150.0–400.0)
RBC: 4.41 Mil/uL (ref 3.87–5.11)
RDW: 13.1 % (ref 11.5–15.5)
WBC: 5.5 10*3/uL (ref 4.0–10.5)

## 2014-07-05 LAB — TSH: TSH: 1.56 u[IU]/mL (ref 0.35–4.50)

## 2014-07-05 NOTE — Assessment & Plan Note (Signed)
Physical today.

## 2014-07-05 NOTE — Assessment & Plan Note (Signed)
Diet and exercise.   

## 2014-07-05 NOTE — Progress Notes (Signed)
Pre visit review using our clinic review tool, if applicable. No additional management support is needed unless otherwise documented below in the visit note. 

## 2014-07-05 NOTE — Assessment & Plan Note (Signed)
Low cholesterol diet and exercise.  Check lipid panel.   

## 2014-07-05 NOTE — Progress Notes (Signed)
[name], female   DOB: [birth year], 64 y.o.   MRN: [id number]   Subjective:    [name], female    DOB: 09/22/1950, 64 y.o.   MRN: [id number]  HPI  Patient here for her physical exam.  Had pain in her feet.  Diagnosed with plantar fasciitis.  Is s/p injection.  Did not help.  Now seeing a chiropractor.  Feet are better.  Previously had pain in her right breast.  Had bilateral mammogram and right breast ultrasound. Mammogram and ultrasound - ok.  No significant pain now.  No cardiac symptoms with increased activity or exertion.  Eating and drinking well.     Past Medical History  Diagnosis Date  . Thrombocytopenia     Current Outpatient Prescriptions on File Prior to Visit  Medication Sig Dispense Refill  . colestipol (COLESTID) 1 G tablet Take 1 g by mouth as needed.    . Saccharomyces boulardii (FLORASTOR PO) Take by mouth daily as needed.     No current facility-administered medications on file prior to visit.    Review of Systems  Constitutional: Negative for appetite change, fatigue and unexpected weight change.  HENT: Negative for congestion and sinus pressure.   Respiratory: Negative for cough, chest tightness and shortness of breath.   Cardiovascular: Negative for chest pain, palpitations and leg swelling.  Gastrointestinal: Negative for nausea, vomiting, abdominal pain and diarrhea.       Bowels doing well.    Genitourinary: Negative for frequency and difficulty urinating.  Musculoskeletal: Negative for back pain and joint swelling.  Skin: Negative for color change and rash.  Neurological: Negative for dizziness, light-headedness and headaches.  Hematological: Negative for adenopathy. Does not bruise/bleed easily.  Psychiatric/Behavioral: Negative for behavioral problems and agitation.       Objective:     Blood pressure recheck:  128/82  Physical Exam  Constitutional: She is oriented to person, place, and time. She  appears well-developed and well-nourished.  HENT:  Nose: Nose normal.  Mouth/Throat: Oropharynx is clear and moist.  Eyes: Right eye exhibits no discharge. Left eye exhibits no discharge. No scleral icterus.  Neck: Neck supple. No thyromegaly present.  Cardiovascular: Normal rate and regular rhythm.   Pulmonary/Chest: Breath sounds normal. No accessory muscle usage. No tachypnea. No respiratory distress. She has no decreased breath sounds. She has no wheezes. She has no rhonchi. Right breast exhibits no inverted nipple, no mass, no nipple discharge and no tenderness (no axillary adenopathy). Left breast exhibits no inverted nipple, no mass, no nipple discharge and no tenderness (no axilarry adenopathy).  Abdominal: Soft. Bowel sounds are normal. There is no tenderness.  Musculoskeletal: She exhibits no edema or tenderness.  Lymphadenopathy:    She has no cervical adenopathy.  Neurological: She is alert and oriented to person, place, and time.  Skin: Skin is warm. No rash noted.  Psychiatric: She has a normal mood and affect. Her behavior is normal.    BP 120/80 mmHg  Pulse 65  Temp(Src) 98.1 F (36.7 C) (Oral)  Ht 5' 5.5" (1.664 m)  Wt 184 lb 4 oz (83.575 kg)  BMI 30.18 kg/m2  SpO2 95% Wt Readings from Last 3 Encounters:  07/05/14 184 lb 4 oz (83.575 kg)  05/25/14 181 lb 6.4 oz (82.283 kg)  01/01/14 184 lb (83.462 kg)     Lab Results  Component Value Date   WBC 5.5 07/05/2014   HGB 14.0 07/05/2014   HCT 39.7 07/05/2014  PLT 147.0* 07/05/2014   GLUCOSE 109* 07/05/2014   CHOL 201* 07/05/2014   TRIG 109.0 07/05/2014   HDL 70.80 07/05/2014   LDLCALC 108* 07/05/2014   ALT 60* 07/05/2014   AST 49* 07/05/2014   NA 140 07/05/2014   K 4.0 07/05/2014   CL 105 07/05/2014   CREATININE 0.65 07/05/2014   BUN 15 07/05/2014   CO2 27 07/05/2014   TSH 1.56 07/05/2014       Assessment & Plan:   Problem List Items Addressed This Visit    Health care maintenance    Physical  today.        Hypercholesteremia - Primary    Low cholesterol diet and exercise.  Check lipid panel.        Relevant Orders   Lipid panel (Completed)   Obesity (BMI 30-39.9)    Diet and exercise.         Plantar fasciitis    Seeing a chiropractor now.   Feet are doing better.  Follow.        Thrombocytopenia    Platelet count has been stable.  Recheck to confirm stable.        Relevant Orders   CBC with Differential/Platelet (Completed)    Other Visit Diagnoses    Syncope, unspecified syncope type        Relevant Orders    Comprehensive metabolic panel (Completed)    TSH (Completed)      I spent 25 minutes with the patient and more than 50% of the time was spent in consultation regarding the above.     Einar Pheasant, MD

## 2014-07-05 NOTE — Progress Notes (Signed)
Orders placed for f/u labs.  

## 2014-07-05 NOTE — Assessment & Plan Note (Signed)
Seeing a chiropractor now.   Feet are doing better.  Follow.

## 2014-07-05 NOTE — Assessment & Plan Note (Signed)
Platelet count has been stable.  Recheck to confirm stable.

## 2014-07-15 ENCOUNTER — Other Ambulatory Visit (INDEPENDENT_AMBULATORY_CARE_PROVIDER_SITE_OTHER): Payer: BLUE CROSS/BLUE SHIELD

## 2014-07-15 DIAGNOSIS — R945 Abnormal results of liver function studies: Secondary | ICD-10-CM

## 2014-07-15 DIAGNOSIS — R7989 Other specified abnormal findings of blood chemistry: Secondary | ICD-10-CM

## 2014-07-15 DIAGNOSIS — D696 Thrombocytopenia, unspecified: Secondary | ICD-10-CM

## 2014-07-15 DIAGNOSIS — R739 Hyperglycemia, unspecified: Secondary | ICD-10-CM

## 2014-07-15 LAB — HEPATIC FUNCTION PANEL
ALK PHOS: 58 U/L (ref 39–117)
ALT: 36 U/L — AB (ref 0–35)
AST: 30 U/L (ref 0–37)
Albumin: 4.2 g/dL (ref 3.5–5.2)
BILIRUBIN DIRECT: 0.1 mg/dL (ref 0.0–0.3)
Total Bilirubin: 0.8 mg/dL (ref 0.2–1.2)
Total Protein: 6.7 g/dL (ref 6.0–8.3)

## 2014-07-15 LAB — HEMOGLOBIN A1C: Hgb A1c MFr Bld: 6.1 % (ref 4.6–6.5)

## 2014-07-16 ENCOUNTER — Telehealth: Payer: Self-pay | Admitting: Internal Medicine

## 2014-07-16 ENCOUNTER — Encounter: Payer: Self-pay | Admitting: Internal Medicine

## 2014-07-16 DIAGNOSIS — R7989 Other specified abnormal findings of blood chemistry: Secondary | ICD-10-CM

## 2014-07-16 DIAGNOSIS — R945 Abnormal results of liver function studies: Secondary | ICD-10-CM

## 2014-07-16 LAB — PLATELET COUNT: Platelets: 157 10*3/uL (ref 150–400)

## 2014-07-16 LAB — GLUCOSE, FASTING: GLUCOSE, FASTING: 97 mg/dL (ref 70–99)

## 2014-07-16 NOTE — Telephone Encounter (Signed)
Pt was notified of labs via my chart.  Needs a f/u non fasting lab in 3 weeks.  Please schedule and contact her with an appt date and time.  Thanks.

## 2014-07-23 NOTE — Telephone Encounter (Signed)
Unread mychart message mailed to patient 

## 2014-07-25 ENCOUNTER — Inpatient Hospital Stay: Payer: Self-pay | Admitting: Specialist

## 2014-07-25 LAB — CBC AND DIFFERENTIAL
HCT: 41 % (ref 36–46)
Hemoglobin: 14.3 g/dL (ref 12.0–16.0)
Platelets: 146 10*3/uL — AB (ref 150–399)
WBC: 6 10^3/mL

## 2014-07-25 LAB — BASIC METABOLIC PANEL
BUN: 16 mg/dL (ref 4–21)
CREATININE: 0.7 mg/dL (ref 0.5–1.1)
GLUCOSE: 110 mg/dL
Potassium: 3.8 mmol/L (ref 3.4–5.3)
Sodium: 146 mmol/L (ref 137–147)

## 2014-07-25 LAB — HEMOGLOBIN A1C: HEMOGLOBIN A1C: 5.7 % (ref 4.0–6.0)

## 2014-07-26 ENCOUNTER — Other Ambulatory Visit: Payer: Self-pay | Admitting: Physician Assistant

## 2014-07-26 DIAGNOSIS — I639 Cerebral infarction, unspecified: Secondary | ICD-10-CM

## 2014-07-26 LAB — LIPID PANEL
CHOLESTEROL: 157 mg/dL (ref 0–200)
HDL: 55 mg/dL (ref 35–70)
LDL Cholesterol: 74 mg/dL
TRIGLYCERIDES: 141 mg/dL (ref 40–160)

## 2014-07-26 LAB — BASIC METABOLIC PANEL
BUN: 14 mg/dL (ref 4–21)
CREATININE: 0.7 mg/dL (ref 0.5–1.1)
Glucose: 99 mg/dL
POTASSIUM: 3.8 mmol/L (ref 3.4–5.3)
SODIUM: 145 mmol/L (ref 137–147)

## 2014-07-26 LAB — CBC AND DIFFERENTIAL
HCT: 39 % (ref 36–46)
HEMOGLOBIN: 13.2 g/dL (ref 12.0–16.0)
Platelets: 138 10*3/uL — AB (ref 150–399)
WBC: 5.2 10^3/mL

## 2014-07-26 LAB — TSH: TSH: 1.75 u[IU]/mL (ref 0.41–5.90)

## 2014-07-28 ENCOUNTER — Telehealth: Payer: Self-pay | Admitting: *Deleted

## 2014-07-28 NOTE — Telephone Encounter (Signed)
Pt was admitted to [facility] on 07/25/14 & discharged on 07/27/14 for: 1) acute stroke 2) Thrombocytopenia, incidental.  Scheduled for Hospital follow-up on: Fri. 07/30/14  Discharge summary placed in Dr. Rosealee Albee folder & copy given to Marland.  Will request Hospital records on 07/29/14

## 2014-07-29 ENCOUNTER — Telehealth: Payer: Self-pay | Admitting: *Deleted

## 2014-07-29 NOTE — Telephone Encounter (Signed)
Discharge Date: 07/27/14  Transition Care Management Follow-up Telephone Call  How have you been since you were released from the hospital? Pt states "weak, have a walker that I'm using though"    Do you understand why you were in the hospital? YES   Do you understand the discharge instrcutions? YES  Items Reviewed:  Medications reviewed: YES  Allergies reviewed: NO  Dietary changes reviewed: NO  Referrals reviewed: N/A - But will need referral for Neurology from our office    Functional Questionnaire:   Activities of Daily Living (ADLs):   She states they are independent in the following:  States they require assistance with the following:    Any transportation issues/concerns?: NO   Any patient concerns? NO   Confirmed importance and date/time of follow-up visits scheduled: YES, 07/30/14 @ 10:00    Confirmed with patient if condition begins to worsen call PCP or go to the ER.  Patient was given the Call-a-Nurse line [phone]: YES

## 2014-07-30 ENCOUNTER — Encounter: Payer: Self-pay | Admitting: Internal Medicine

## 2014-07-30 ENCOUNTER — Ambulatory Visit (INDEPENDENT_AMBULATORY_CARE_PROVIDER_SITE_OTHER): Payer: BLUE CROSS/BLUE SHIELD | Admitting: Internal Medicine

## 2014-07-30 VITALS — BP 121/67 | HR 65 | Temp 98.3°F | Ht 65.5 in | Wt 180.1 lb

## 2014-07-30 DIAGNOSIS — F439 Reaction to severe stress, unspecified: Secondary | ICD-10-CM

## 2014-07-30 DIAGNOSIS — Z658 Other specified problems related to psychosocial circumstances: Secondary | ICD-10-CM

## 2014-07-30 DIAGNOSIS — D696 Thrombocytopenia, unspecified: Secondary | ICD-10-CM

## 2014-07-30 DIAGNOSIS — E7800 Pure hypercholesterolemia, unspecified: Secondary | ICD-10-CM

## 2014-07-30 DIAGNOSIS — I639 Cerebral infarction, unspecified: Secondary | ICD-10-CM

## 2014-07-30 NOTE — Progress Notes (Signed)
Pre visit review using our clinic review tool, if applicable. No additional management support is needed unless otherwise documented below in the visit note. 

## 2014-08-02 ENCOUNTER — Telehealth: Payer: Self-pay

## 2014-08-02 ENCOUNTER — Encounter: Payer: Self-pay | Admitting: Internal Medicine

## 2014-08-02 DIAGNOSIS — Z8673 Personal history of transient ischemic attack (TIA), and cerebral infarction without residual deficits: Secondary | ICD-10-CM | POA: Insufficient documentation

## 2014-08-02 DIAGNOSIS — F439 Reaction to severe stress, unspecified: Secondary | ICD-10-CM | POA: Insufficient documentation

## 2014-08-02 NOTE — Assessment & Plan Note (Signed)
Increased stress with the recent hospitalization and residual weakness.  She has good family support.  Follow.

## 2014-08-02 NOTE — Assessment & Plan Note (Signed)
Low cholesterol diet and exercise.  She is hesitant to start lipitor.  Discussed with her today.  Follow.

## 2014-08-02 NOTE — Telephone Encounter (Signed)
The patient called hoping to get an update on her referrals.

## 2014-08-02 NOTE — Assessment & Plan Note (Signed)
Recent hospitalization with CVA.  Hospital records reviewed.  Continue daily aspirin.  Schedule an appt with neurology.  Carotid ultrasound revealed no significant stenosis.  ECHO - no thrombus.  May need cardiac evaluation with possible event monitor.  Have neurology evaluate and give recommendations.  With resulting weakness and unsteadiness.  Arrange home health physical therapy.

## 2014-08-02 NOTE — Progress Notes (Signed)
[name], female   DOB: [birth year], 64 y.o.   MRN: [id number]   Subjective:    [name], female    DOB: [birth year], 64 y.o.   MRN: [id number]  HPI  Patient here for a hospital follow up.  She presented to the hospital 07/25/14 with documented diagnosis of acute CVA.  On presentation, she was confused and had slurred speech.  MRI in the hospital revealed a right frontal subcortical white matter lesion with changes felt to be c/w a subacute infarct.  Had carotid duplex revealed no significant stenosis.  ECHO revealed no evidence of thrombus.  She is taking aspirin daily.  Has improved.  She is ambulating with a walker.  Still has episodes where she knows what she wants to say, but cannot get the right word out.  Is still weak.  Hard to do her ADLs.  Needs assistance.  Balance is an issue.     Past Medical History  Diagnosis Date  . Thrombocytopenia     Current Outpatient Prescriptions on File Prior to Visit  Medication Sig Dispense Refill  . colestipol (COLESTID) 1 G tablet Take 1 g by mouth as needed.    [place] Kitchen OVER THE COUNTER MEDICATION Vitamin pack    . Saccharomyces boulardii (FLORASTOR PO) Take by mouth daily as needed.     No current facility-administered medications on file prior to visit.    Review of Systems  Constitutional: Negative for appetite change and unexpected weight change.  HENT: Negative for congestion and sinus pressure.   Respiratory: Negative for cough, chest tightness and shortness of breath.   Cardiovascular: Negative for chest pain, palpitations and leg swelling.  Gastrointestinal: Negative for nausea, vomiting and abdominal pain.  Musculoskeletal: Positive for gait problem (having problems with balance issues.  ).  Neurological: Positive for speech difficulty, weakness and light-headedness. Negative for headaches.  Psychiatric/Behavioral:       Increased stress related to the recent issues and hospitalization.          Objective:     Blood pressure recheck:  122/80  Physical Exam  Constitutional: She appears well-developed and well-nourished. No distress.  HENT:  Nose: Nose normal.  Mouth/Throat: Oropharynx is clear and moist.  Neck: Neck supple. No thyromegaly present.  Cardiovascular: Normal rate and regular rhythm.   Pulmonary/Chest: Breath sounds normal. No respiratory distress. She has no wheezes.  Abdominal: Soft. Bowel sounds are normal. There is no tenderness.  Musculoskeletal: She exhibits no edema or tenderness.  Lymphadenopathy:    She has no cervical adenopathy.  Neurological:  Unsteady gait.    Skin: No rash noted. No erythema.    BP 121/67 mmHg  Pulse 65  Temp(Src) 98.3 F (36.8 C) (Oral)  Ht 5' 5.5" (1.664 m)  Wt 180 lb 2 oz (81.704 kg)  BMI 29.51 kg/m2  SpO2 95% Wt Readings from Last 3 Encounters:  07/30/14 180 lb 2 oz (81.704 kg)  07/05/14 184 lb 4 oz (83.575 kg)  05/25/14 181 lb 6.4 oz (82.283 kg)     Lab Results  Component Value Date   WBC 5.2 07/26/2014   HGB 13.2 07/26/2014   HCT 39 07/26/2014   PLT 138* 07/26/2014   GLUCOSE 109* 07/05/2014   CHOL 157 07/26/2014   TRIG 141 07/26/2014   HDL 55 07/26/2014   LDLCALC 74 07/26/2014   ALT 36* 07/15/2014   AST 30 07/15/2014   NA 145 07/26/2014   K 3.8 07/26/2014   CL  105 07/05/2014   CREATININE 0.7 07/26/2014   BUN 14 07/26/2014   CO2 27 07/05/2014   TSH 1.75 07/26/2014   HGBA1C 5.7 07/25/2014       Assessment & Plan:   Problem List Items Addressed This Visit    CVA (cerebral vascular accident) - Primary    Recent hospitalization with CVA.  Hospital records reviewed.  Continue daily aspirin.  Schedule an appt with neurology.  Carotid ultrasound revealed no significant stenosis.  ECHO - no thrombus.  May need cardiac evaluation with possible event monitor.  Have neurology evaluate and give recommendations.  With resulting weakness and unsteadiness.  Arrange home health physical therapy.         Relevant Medications   aspirin 81 MG tablet   Other Relevant Orders   Ambulatory referral to Neurology   Ambulatory referral to [place] cholesterol diet and exercise.  She is hesitant to start lipitor.  Discussed with her today.  Follow.        Relevant Medications   aspirin 81 MG tablet   Stress    Increased stress with the recent hospitalization and residual weakness.  She has good family support.  Follow.        Thrombocytopenia    Platelets have been low intermittently.  Recheck cbc with next labs.  Previous ultrasound revealed no splenomegaly.            Einar Pheasant, MD

## 2014-08-02 NOTE — Assessment & Plan Note (Signed)
Platelets have been low intermittently.  Recheck cbc with next labs.  Previous ultrasound revealed no splenomegaly.

## 2014-08-09 ENCOUNTER — Telehealth: Payer: Self-pay | Admitting: *Deleted

## 2014-08-09 ENCOUNTER — Other Ambulatory Visit: Payer: BLUE CROSS/BLUE SHIELD

## 2014-08-09 NOTE — Telephone Encounter (Signed)
ok 

## 2014-08-09 NOTE — Telephone Encounter (Signed)
Left message with verbal ok.

## 2014-08-09 NOTE — Telephone Encounter (Signed)
Shreya from [facility] called, states she picked up pt this week for physical therapy, requesting verbal order to continue PT, twice weekly x 3 weeks, states pt is doing really well.

## 2014-09-22 ENCOUNTER — Other Ambulatory Visit: Payer: Self-pay | Admitting: Neurology

## 2014-09-22 DIAGNOSIS — I63511 Cerebral infarction due to unspecified occlusion or stenosis of right middle cerebral artery: Secondary | ICD-10-CM

## 2014-09-26 NOTE — Consult Note (Signed)
General Aspect Primary Cardiologist: New to [facility] __________________  64 year old female with no prior known PMH who presented to [facility] today after several days of not eating and drinking well and was found to have right frontal subcortical subacute infarct.  _________________  PMH: 1. Patient denies _________________   Present Illness 64 year old female with the above problem list who presented to [facility] today with a right frontal subcortical subacute infarct.  She does not have any previously known cardiac history and has not had any prior cardiac stress tests or cardiac caths. She did wear an outpatient cardiac monitor 5-6 years ago that was ordered through her PCP 2/2 a syncopal event. Per her report this was normal and no further evaluation was pursued at that time. No previously known echos.   She has recently been dealing with the stressful event of the death of her friend's husband. She has not slept in about 48 hours leading up to the previous night. She has not eaten well, at times over this past weekend only an orange and a pack of nabs. She woke up on Sunday morning not feeling well. She felt like she had been run over by a truck as she described it. She called her friend who lost her husband to ask her how she felt and she reported feeling ok. She went on ahead to go to church. Upon arriving to church she continued to not feel well, she decided to move to the back of the church. She felt very weak. She had 3 intermittent episodes of slurred speech prior to EMS arrival. She could not move her bilateral arms. She again had an episode of slurred speech upon her arrival to [facility]. This resolved within minutes. She regained full function of all extremites within minutes, but remained very weak. She continues to remain weak at this time. She denies having any chest pain or palpitations with any of the above. She does note having palpitations at times when she gets quite hungry and after an episode of  diarrhea a while back. No nausea or vomiting.   Upon her arrival to [facility] troponin was negative x 1. CT head showed Old infarction seen involving right frontal subcortical white matter. No acute intracranial abnormality seen. MRI brain showed right frontal subcortical subacute infarct. Carotid dopplers negative. PT worked with her today, she remained weak. Echo is pending.   Physical Exam:  GEN well developed, no acute distress   HEENT hearing intact to voice   NECK no JVD   RESP normal resp effort   CARD Regular rate and rhythm  No murmur   ABD denies tenderness  soft   EXTR negative edema   SKIN normal to palpation   NEURO cranial nerves intact   PSYCH alert   Review of Systems:  Subjective/Chief Complaint weakness   General: Fatigue  Weakness   Skin: No Complaints   ENT: No Complaints   Eyes: No Complaints   Neck: No Complaints   Respiratory: No Complaints   Cardiovascular: No Complaints   Gastrointestinal: No Complaints   Genitourinary: No Complaints   Vascular: No Complaints   Musculoskeletal: No Complaints   Neurologic: as above   Hematologic: No Complaints   Endocrine: No Complaints   Psychiatric: No Complaints   Review of Systems: All other systems were reviewed and found to be negative   Medications/Allergies Reviewed Medications/Allergies reviewed   Family & Social History:  Family and Social History:  Family History Diabetes Mellitus  Stroke   Social History negative tobacco, negative ETOH, negative Illicit drugs   Place of Living Home     Denies medical history:    FOOT SURGERY:   Home Medications: Medication Instructions Status  Probiotic Formula - oral capsule 1 cap(s) orally once a day Active  multivitamin 1 tab(s) orally once a day Active  colestipol 1 g oral tablet 1 tab(s) orally once a day Active   Lab Results:  Thyroid:  29-Feb-16 04:44   Thyroid Stimulating Hormone 1.75 (0.45-4.50 (IU = International Unit)   ----------------------- Pregnant patients have  different reference  ranges for TSH:  - - - - - - - - - -  Pregnant, first trimetser:  0.36 - 2.50 uIU/mL)  Routine Chem:  29-Feb-16 04:44   Cholesterol, Serum 157  Triglycerides, Serum 141  HDL (INHOUSE) 55  VLDL Cholesterol Calculated 28  LDL Cholesterol Calculated 74 (Result(s) reported on 26 Jul 2014 at 05:42AM.)  Glucose, Serum 99  BUN 14  Creatinine (comp) 0.67  Sodium, Serum 145  Potassium, Serum 3.8  Chloride, Serum  112  CO2, Serum 27  Calcium (Total), Serum  8.4  Anion Gap  6  Osmolality (calc) 289  eGFR (African American) >60  eGFR (Non-African American) >60 (eGFR values <87m/min/1.73 m2 may be an indication of chronic kidney disease (CKD). Calculated eGFR, using the MRDR Study equation, is useful in  patients with stable renal function. The eGFR calculation will not be reliable in acutely ill patients when serum creatinine is changing rapidly. It is not useful in patients on dialysis. The eGFR calculation may not be applicable to patients at the low and high extremes of body sizes, pregnant women, and vegetarians.)  Magnesium, Serum 2.3 (1.8-2.4 THERAPEUTIC RANGE: 4-7 mg/dL TOXIC: > 10 mg/dL  -----------------------)  Hemoglobin A1c ([facility]) 5.7 (The American Diabetes Association recommends that a primary goal of therapy should be <7% and that physicians should reevaluate the treatment regimen in patients with HbA1c values consistently >8%.)  Cardiac:  28-Feb-16 12:27   Troponin I < 0.02 (0.00-0.05 0.05 ng/mL or less: NEGATIVE  Repeat testing in 3-6 hrs  if clinically indicated. >0.05 ng/mL: POTENTIAL  MYOCARDIAL INJURY. Repeat  testing in 3-6 hrs if  clinically indicated. NOTE: An increase or decrease  of 30% or more on serial  testing suggests a  clinically important change)  Routine Hem:  29-Feb-16 04:44   WBC (CBC) 5.2  RBC (CBC) 4.21  Hemoglobin (CBC) 13.2  Hematocrit (CBC) 38.9  Platelet  Count (CBC)  138  MCV 93  MCH 31.4  MCHC 34.0  RDW 13.1  Neutrophil % 63.2  Lymphocyte % 28.6  Monocyte % 6.3  Eosinophil % 1.7  Basophil % 0.2  Neutrophil # 3.3  Lymphocyte # 1.5  Monocyte # 0.3  Eosinophil # 0.1  Basophil # 0.0 (Result(s) reported on 26 Jul 2014 at 05:57AM.)   EKG:  EKG Interp. by me   Interpretation EKG shows NSR, 65 bpm, no st/t changes   Radiology Results: [place]    28-Feb-16 16:17, [place] Doppler Bilateral  [place] Doppler Bilateral   REASON FOR EXAM:    TIA  COMMENTS:       PROCEDURE: [place] - [place]OTID DOPPLER BILATERAL  - Jul 25 2014  4:17PM     CLINICAL DATA:  TIA symptoms    EXAM:  BILATERAL CAROTID DUPLEX ULTRASOUND    TECHNIQUE:  GPearline Cablesscale imaging, color Doppler and duplex ultrasound were  performed of bilateral carotid  and vertebral arteries in the neck.    COMPARISON:  None.  FINDINGS:  Criteria: Quantification of carotid stenosis is based on velocity  parameters that correlate the residual internal carotid diameter  with NASCET-based stenosis levels, using the diameter of the distal  internal carotid lumen as the denominator for stenosis measurement.    The following velocity measurements were obtained:    RIGHT    ICA:  77/35 cm/sec    CCA:  81/82 cm/sec    SYSTOLIC ICA/CCA RATIO:  0.9  DIASTOLIC ICA/CCA RATIO:  1.2    ECA:  96 cm/sec    LEFT    ICA:  83/35 cm/sec    CCA:  99/37 cm/sec    SYSTOLIC ICA/CCA RATIO:  1.2    DIASTOLIC ICA/CCA RATIO:  0.8    ECA:  52 cm/sec  RIGHT CAROTID ARTERY: The grayscale images show no significant  atherosclerotic plaque. The waveforms, velocities and flow velocity  ratios show no evidence of focal hemodynamically significant  stenosis.    RIGHT VERTEBRAL ARTERY:  Antegrade in nature.    LEFT CAROTID ARTERY: Grayscale images show no significant  atherosclerotic plaque. The waveforms, velocities and flow velocity  ratios show no evidence of focal hemodynamically  significant  stenosis.    LEFT VERTEBRAL ARTERY:  Antegrade in nature.     IMPRESSION:  No evidence of focalcarotid stenosis bilaterally.      Electronically Signed    By: Inez Catalina M.D.    On: 07/25/2014 17:03         Verified By: Everlene Farrier, M.D.,  MRI:    29-Feb-16 09:52, MRI Brain Without Contrast  MRI Brain Without Contrast   REASON FOR EXAM:    CVA  COMMENTS:       PROCEDURE: MR  - MR BRAIN WO CONTRAST  - Jul 26 2014  9:52AM     CLINICAL DATA:  Weakness and numbness for the past 3 days. Initial  encounter.    EXAM:  MRI HEAD WITHOUT CONTRAST    TECHNIQUE:  Multiplanar, multiecho pulse sequences of the brain and surrounding  structures were obtained without intravenous contrast.  COMPARISON:  CT head 07/25/2014.    FINDINGS:  There is a RIGHT frontal subcortical white matter lesions showing  mild peripheral restricteddiffusion, approximately 1 cm in diameter  (image 28 series 5, image 22 series 9). The lesion displays T2 and  FLAIR hyperintensity. No other similar lesions.    No hemorrhage, hydrocephalus, or extra-axial fluid.    Mild premature cerebral and cerebellar atrophy. Paucity of white  matter disease elsewhere, perhaps single subcentimeter RIGHT  anterior frontal white matter focus. No subarachnoid blood. No areas  of large vessel infarction.  Flow voids are maintained throughout the carotid, basilar, and  vertebral arteries. There are no areas of chronic hemorrhage.  Pituitary, pineal, and cerebellar tonsils unremarkable. No upper  cervical lesions. Visualized calvarium, skull base, and upper  cervical osseous structures unremarkable. Scalp and extracranial  soft tissues, orbits, sinuses, and mastoids show no acute process.    Compared with prior CT head, the lesion is visualized on CT as an  area of hypoattenuation without hemorrhage or surrounding edema.     IMPRESSION:  RIGHT frontal subcortical white matter lesion showing  mild  peripheral restricted diffusion approximately 1 cm in diameter. I  favor this representing a subacute infarct.  Mild atrophy with a paucity of white matter disease elsewhere.    Consider post infusion imaging for further evaluation if  no  contraindications to gadolinium administration.      Electronically Signed    By: Rolla Flatten M.D.    On: 07/26/2014 10:19         Verified By: Staci Righter, M.D.,  Cardiology:    29-Feb-16 13:45, Echo Doppler  Echo Doppler   REASON FOR EXAM:      COMMENTS:       PROCEDURE: [facility] - ECHO DOPPLER COMPLETE(TRANSTHOR)  - Jul 26 2014  1:45PM     RESULT: Echocardiogram Report    Patient Name:   [facility] Date of Exam: 07/26/2014  Medical Rec #:  [id number] Custom1:  Date of Birth:  [birth year]              Height:       65.0 in  Patient Age:    39 years               Weight:       178.6 lb  Patient Gender: F                      BSA:          1.88 m??    Indications: CVA  Sonographer:    Sherrie Sport RDCS  Referring Phys: Demetrios Loll    Summary:   1. Left ventricular ejection fraction, by visual estimation, is 55 to   60%.   2. Normal global left ventricular systolic function.   3. Mildly increased left ventricular posterior wall thickness.  2D AND M-MODE MEASUREMENTS (normal ranges within parentheses):  Left Ventricle:          Normal  IVSd (2D):      0.94 cm (0.7-1.1)  LVPWd (2D):     1.24 cm (0.7-1.1) Aorta/LA:                  Normal  LVIDd (2D):     4.01 cm (3.4-5.7) Aortic Root (2D): 3.40 cm (2.4-3.7)  LVIDs (2D):     2.74 cm           Left Atrium (2D): 4.10 cm (1.9-4.0)  LV FS (2D):     31.7 %   (>25%)  LV EF (2D):     60.2 %   (>50%)                                    Right Ventricle:                                    RVd (2D):        0.24 cm  LV DIASTOLIC FUNCTION:  MV Peak E: 0.56 m/s E/e' Ratio: 6.60  MV Peak A: 0.88 m/s Decel Time: 211 msec  E/A Ratio: 0.63  SPECTRAL DOPPLER ANALYSIS (where applicable):  Mitral  Valve:  MV P1/2 Time: 61.19 msec  MV Area, PHT: 3.60 cm??  Aortic Valve: AoV Max Vel: 1.01 m/s AoV Peak PG: 4.1 mmHg AoV Mean PG:  LVOT Vmax: 0.82 m/s LVOT VTI:  LVOT Diameter: 2.00 cm  AoV Area, Vmax: 2.57 cm?? AoV Area, VTI:  AoV Area, Vmn:  Tricuspid Valve and PA/RV Systolic Pressure: TR Max Velocity: 2.19 m/s RA   Pressure: 5 mmHg RVSP/PASP: 24.2 mmHg  Pulmonic Valve:  PV Max Velocity: 0.80 m/s PV Max PG: 2.6 mmHg  PV Mean PG:    PHYSICIAN INTERPRETATION:  Left Ventricle: The left ventricular internal cavity size was normal. LV   septal wall thickness was normal. LV posterior wall thickness was mildly   increased. Global LV systolic function was normal. Left ventricular   ejection fraction, by visual estimation, is 55 to 60%.  Right Ventricle: The right ventricular size is normal. Global RV systolic   functionis normal.  Left Atrium: The left atrium is normal in size.  Right Atrium: The right atrium is normal in size.  Pericardium: There is no evidence of pericardial effusion.  Mitral Valve: The mitral valve is normal in structure. Trace mitral valve   regurgitation is seen.  Tricuspid Valve: The tricuspid valve is normal. Trivial tricuspid   regurgitation is visualized. The tricuspid regurgitant velocity is 2.19   m/s, and with an assumed right atrial pressure of 5 mmHg, the estimated   right ventricular systolic pressure is normal at 24.2 mmHg.  Aortic Valve: The aortic valve is normal. The aortic valve is   structurally normal, with no evidence of sclerosis or stenosis. No   evidence of aortic valve regurgitation is seen.  Pulmonic Valve: The pulmonic valve is normal.    Franklin Furnace MD  Electronically signed by Gutierrez MD  Signature Date/Time: 07/26/2014/3:14:19 PM    *** Final ***  IMPRESSION: .        Verified By: Yolonda Kida, M.D., MD  CT:    28-Feb-16 12:46, CT Head Without Contrast  CT Head Without Contrast   REASON FOR EXAM:     DIFFICULTY SPEAKING  COMMENTS:       PROCEDURE: CT  - CT HEAD WITHOUT CONTRAST  - Jul 25 2014 12:46PM     CLINICAL DATA:  Difficulty speaking, weakness, near syncope.    EXAM:  CT HEAD WITHOUT CONTRAST    TECHNIQUE:  Contiguous axial images were obtained from the base of the skull  through the vertex without intravenous contrast.    COMPARISON:  None.  FINDINGS:  Bony calvarium appears intact. No mass effect or midline shift is  noted. Ventricular size is within normal limits.There is no  evidence of mass lesion, hemorrhage or acute infarction. Focal low  density is noted in right frontal subcortical white matter  consistent with chronic ischemic change.     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 No acute intracranial abnormality seen.      Electronically Signed    By: Marijo Conception, M.D.    On: 07/25/2014 13:38     Verified By: Marveen Reeks, M.D.,    No Known Allergies:   Vital Signs/Nurse's Notes: **Vital Signs.:   29-Feb-16 11:21  Vital Signs Type Routine  Temperature Temperature (F) 97.3  Celsius 36.2  Temperature Source oral  Pulse Pulse 72  Respirations Respirations 18  Systolic BP Systolic BP 482  Diastolic BP (mmHg) Diastolic BP (mmHg) 85  Mean BP 102  Pulse Ox % Pulse Ox % 94  Pulse Ox Activity Level  At rest  Oxygen Delivery Room Air/ 21 %    Impression 64 year old female with no prior known PMH who presented to [facility] today after several days of not eating and drinking well and was found to have right frontal subcortical subacute infarct. etiology unclear   1. Subacute stroke: -TTE essentially normal. Bubble study not performed. No doppler evidence of PFO or ASD -Monitor on tele while in patient, if  no significant arrhythmia is seen will need outpatient cardiac monitoring 30 days -Lipid panel with LDL 74, A1C 5.7%, carotid dopplers negative -Agree with statin for primary prevention, goal LDL <70 ASA  or plavix daily follow up in clinic. For any additional TIA or CVA sx, should consider more aggressive anticoagulation   Electronic Signatures: Rise Mu (PA-C)  (Signed 29-Feb-16 15:10)  Authored: General Aspect/Present Illness, History and Physical Exam, Review of System, Family & Social History, Past Medical History, Home Medications, Labs, EKG , Radiology, Allergies, Vital Signs/Nurse's Notes, Impression/Plan Ida Rogue (MD)  (Signed 01-Mar-16 14:35)  Authored: General Aspect/Present Illness, History and Physical Exam, Review of System, EKG , Radiology, Vital Signs/Nurse's Notes, Impression/Plan  Co-Signer: General Aspect/Present Illness, History and Physical Exam, Review of System, Family & Social History, Past Medical History, Home Medications, Labs, EKG , Radiology, Allergies, Vital Signs/Nurse's Notes, Impression/Plan   Last Updated: 01-Mar-16 14:35 by Ida Rogue (MD)

## 2014-09-26 NOTE — Discharge Summary (Signed)
PATIENT NAME:  Victoria Harris, Victoria Harris MR#:  [id number] DATE OF BIRTH:  [birth year]  DATE OF ADMISSION:  07/25/2014 DATE OF DISCHARGE:  07/27/2014  For a detailed note, please look up the history and physical done on admission by Dr. Bridgett Larsson.   DIAGNOSES AT DISCHARGE:  1.  Acute stroke.  2.  Thrombocytopenia, incidental.   DIET: The patient is being discharged on a regular diet.   ACTIVITY: As tolerated.   FOLLOWUP: Dr. Einar Pheasant in the next 1 to 2 weeks.   DISCHARGE MEDICATIONS: Multivitamin daily, aspirin 80 mg daily, colestipol 1 gram daily.   CONSULTANTS DURING THE HOSPITAL COURSE: Dr. Ida Rogue from cardiology, Dr. Jayme Cloud neurology.   PERTINENT STUDIES DONE DURING THE HOSPITAL COURSE: CT scan of the head done without contrast showing old infarction involving the right frontal subcortical white matter. No acute intracranial abnormality noted. An MRI of the brain done without contrast showing right frontal subcortical white matter lesion showing mild peripheral restricted effusion favoring a subacute infarct.   An echocardiogram showing ejection fraction to be 55% to 60% with normal LV function and normal left atrial size. No evidence of any thrombus. A carotid duplex showing no evidence of any hemodynamically significant carotid artery stenosis with antegrade flow in both vertebrals.   HOSPITAL COURSE: This is a 64 year old female who presented to the hospital with left facial numbness, slurred speech, and a suspected syncopal episode.  1.  Acute CVA. This was likely the cause of the patient's acute neurologic symptoms, including the slurred speech, the left facial numbness, and a suspected syncopal episode. The patient initially was worked up for syncope, had a CT head which showed an old infarct, but nothing acute, although an MRI did confirm a subacute right-sided frontal infarct. The patient was started on aspirin as she was not taking anything prior to coming in. Her lipid  profile was at goal; therefore, she was not started on a statin. The patient was seen by neurology. They recommended a workup for a possible embolic stroke given the location of her stroke. She was observed on telemetry, had no acute cardiac arrhythmias. She likely would benefit from a loop event monitor, which is to be arranged through cardiology as an outpatient. At this point, the patient is being discharged on just aspirin. She was seen by physical therapy. Initially they recommended acute inpatient rehabilitation, but patient has dramatically improved and is presently being discharged home with outpatient physical therapy services.  2.  Thrombocytopenia. This was incidentally noted on blood work. The patient had no acute bleeding. This can be further followed up as an outpatient.   CODE STATUS: The patient is a full code.   DISPOSITION: She is being discharged home.   TIME SPENT: 35 minutes.    ____________________________ Belia Heman. Verdell Carmine, MD vjs:at D: 07/27/2014 16:35:03 ET T: 07/27/2014 17:24:27 ET JOB#: [id number]  cc: Belia Heman. Verdell Carmine, MD, <Dictator> Einar Pheasant, MD Henreitta Leber MD ELECTRONICALLY SIGNED 07/28/2014 15:35

## 2014-09-26 NOTE — Consult Note (Signed)
Referring Physician:  Demetrios Loll :   Reason for Consult: Admit Date: 25-Jul-2014  Chief Complaint: syncope  Reason for Consult: CVA   History of Present Illness: History of Present Illness:   [name] presents to [facility] secondary to almost passing out and feeling weak.  Pt did notes some numbness on the L face that went away immediately.  Pt never passed completely out but felt generally weak with no focal findings.  She improved yesterday and retuned to baseline today.  She has never had this happen before.  ROS:  General denies complaints   HEENT no complaints   Lungs no complaints   Cardiac no complaints   GI no complaints   GU no complaints   Musculoskeletal no complaints   Extremities no complaints   Skin no complaints   Neuro no complaints   Endocrine no complaints   Psych no complaints   Past Medical/Surgical Hx:  Denies medical history:   FOOT SURGERY:   Past Medical/ Surgical Hx:  Past Medical History thrombocytopenia   Past Surgical History toe surgery   Home Medications: Medication Instructions Last Modified Date/Time  Aspir 81 81 mg oral delayed release tablet 1 tab(s) orally once a day 01-Mar-16 11:54  Probiotic Formula - oral capsule 1 cap(s) orally once a day 28-Feb-16 20:21  multivitamin 1 tab(s) orally once a day 28-Feb-16 20:21  colestipol 1 g oral tablet 1 tab(s) orally once a day 28-Feb-16 20:21   Allergies:  No Known Allergies:   Allergies:  Allergies NKDA   Social/Family History: Employment Status: unemployed  Lives With: spouse  Living Arrangements: house  Social History: no tob, no EtOH, no illicits  Family History: + hypercoaguable disease, F with stroke   Vital Signs: **Vital Signs.:   01-Mar-16 11:49  Vital Signs Type Routine  Temperature Temperature (F) 97.9  Celsius 36.6  Temperature Source oral  Pulse Pulse 80  Respirations Respirations 18  Systolic BP Systolic BP 409  Diastolic BP (mmHg) Diastolic BP (mmHg)  77  Mean BP 90  Pulse Ox % Pulse Ox % 94  Pulse Ox Activity Level  At rest  Oxygen Delivery Room Air/ 21 %   Physical Exam: General: nl weight, NAD  HEENT: normocephalic, sclera nonicteric, oropharynx clear  Neck: supple, no JVD, no bruits  Chest: CTA B, no wheezing  Cardiac: RRR, no murmurs, no edema, 2+ pulses  Extremities: no C/C/E, FROM   Neurologic Exam: Mental Status: alert and oriented x 3, normal speech and language, follows complex commands  Cranial Nerves: PERRLA, EOMI, nl VF, face symmetric, tongue midline, shoulder shrug equal  Motor Exam: 5/5 B normal, tone, no tremor  Deep Tendon Reflexes: slightly more brisk on L 3/4 compared to 2/4 R, withdrawal plantars B  Sensory Exam: pinprick, temperature, and vibration intact B  Coordination: FTN and HTS WNL, nl RAM, nl gait   Lab Results: Thyroid:  29-Feb-16 04:44   Thyroid Stimulating Hormone 1.75 (0.45-4.50 (IU = International Unit)  ----------------------- Pregnant patients have  different reference  ranges for TSH:  - - - - - - - - - -  Pregnant, first trimetser:  0.36 - 2.50 uIU/mL)  LabObservation:  29-Feb-16 13:45   OBSERVATION Reason for Test  Routine Chem:  29-Feb-16 04:44   Cholesterol, Serum 157  Triglycerides, Serum 141  HDL (INHOUSE) 55  VLDL Cholesterol Calculated 28  LDL Cholesterol Calculated 74 (Result(s) reported on 26 Jul 2014 at 05:42AM.)  Glucose, Serum 99  BUN 14  Creatinine (comp) 0.67  Sodium, Serum 145  Potassium, Serum 3.8  Chloride, Serum  112  CO2, Serum 27  Calcium (Total), Serum  8.4  Anion Gap  6  Osmolality (calc) 289  eGFR (African American) >60  eGFR (Non-African American) >60 (eGFR values <52m/min/1.73 m2 may be an indication of chronic kidney disease (CKD). Calculated eGFR, using the MRDR Study equation, is useful in  patients with stable renal function. The eGFR calculation will not be reliable in acutely ill patients when serum creatinine is changing rapidly. It  is not useful in patients on dialysis. The eGFR calculation may not be applicable to patients at the low and high extremes of body sizes, pregnant women, and vegetarians.)  Magnesium, Serum 2.3 (1.8-2.4 THERAPEUTIC RANGE: 4-7 mg/dL TOXIC: > 10 mg/dL  -----------------------)  Hemoglobin A1c ([facility]) 5.7 (The American Diabetes Association recommends that a primary goal of therapy should be <7% and that physicians should reevaluate the treatment regimen in patients with HbA1c values consistently >8%.)  Cardiac:  28-Feb-16 12:27   Troponin I < 0.02 (0.00-0.05 0.05 ng/mL or less: NEGATIVE  Repeat testing in 3-6 hrs  if clinically indicated. >0.05 ng/mL: POTENTIAL  MYOCARDIAL INJURY. Repeat  testing in 3-6 hrs if  clinically indicated. NOTE: An increase or decrease  of 30% or more on serial  testing suggests a  clinically important change)  Routine UA:  28-Feb-16 12:03   Color (UA) Straw  Clarity (UA) Clear  Glucose (UA) Negative  Bilirubin (UA) Negative  Ketones (UA) Negative  Specific Gravity (UA) 1.003  Blood (UA) Negative  pH (UA) 6.0  Protein (UA) Negative  Nitrite (UA) Negative  Leukocyte Esterase (UA) Negative (Result(s) reported on 25 Jul 2014 at 12:21PM.)  RBC (UA) <1 /HPF  WBC (UA) <1 /HPF  Bacteria (UA) NONE SEEN  Epithelial Cells (UA) 2 /HPF (Result(s) reported on 25 Jul 2014 at 12:21PM.)  Routine Hem:  29-Feb-16 04:44   WBC (CBC) 5.2  RBC (CBC) 4.21  Hemoglobin (CBC) 13.2  Hematocrit (CBC) 38.9  Platelet Count (CBC)  138  MCV 93  MCH 31.4  MCHC 34.0  RDW 13.1  Neutrophil % 63.2  Lymphocyte % 28.6  Monocyte % 6.3  Eosinophil % 1.7  Basophil % 0.2  Neutrophil # 3.3  Lymphocyte # 1.5  Monocyte # 0.3  Eosinophil # 0.1  Basophil # 0.0 (Result(s) reported on 26 Jul 2014 at 05:57AM.)  Bands 1  Segmented Neutrophils 62  Lymphocytes 30  Monocytes 5  Eosinophil 2  Diff Comment 1 ANISOCYTOSIS  Diff Comment 2 PLTS VARIED IN SIZE  Diff Comment 3 LARGE  PLATELETS  Result(s) reported on 26 Jul 2014 at 05:57AM.   Radiology Results: [place]    28-Feb-16 16:17, [place] Doppler Bilateral  [place] Doppler Bilateral   REASON FOR EXAM:    TIA  COMMENTS:       PROCEDURE: [place] - [place]OTID DOPPLER BILATERAL  - Jul 25 2014  4:17PM     CLINICAL DATA:  TIA symptoms    EXAM:  BILATERAL CAROTID DUPLEX ULTRASOUND    TECHNIQUE:  GPearline Cablesscale imaging, color Doppler and duplex ultrasound were  performed of bilateral carotid and vertebral arteries in the neck.    COMPARISON:  None.  FINDINGS:  Criteria: Quantification of carotid stenosis is based on velocity  parameters that correlate the residual internal carotid diameter  with NASCET-based stenosis levels, using the diameter of the distal  internal carotid lumen as the denominator for stenosis measurement.  The following velocity measurements were obtained:    RIGHT    ICA:  77/35 cm/sec    CCA:  26/41 cm/sec    SYSTOLIC ICA/CCA RATIO:  0.9  DIASTOLIC ICA/CCA RATIO:  1.2    ECA:  96 cm/sec    LEFT    ICA:  83/35 cm/sec    CCA:  58/30 cm/sec    SYSTOLIC ICA/CCA RATIO:  1.2    DIASTOLIC ICA/CCA RATIO:  0.8    ECA:  52 cm/sec  RIGHT CAROTID ARTERY: The grayscale images show no significant  atherosclerotic plaque. The waveforms, velocities and flow velocity  ratios show no evidence of focal hemodynamically significant  stenosis.    RIGHT VERTEBRAL ARTERY:  Antegrade in nature.    LEFT CAROTID ARTERY: Grayscale images show no significant  atherosclerotic plaque. The waveforms, velocities and flow velocity  ratios show no evidence of focal hemodynamically significant  stenosis.    LEFT VERTEBRAL ARTERY:  Antegrade in nature.     IMPRESSION:  No evidence of focalcarotid stenosis bilaterally.      Electronically Signed    By: Inez Catalina M.D.    On: 07/25/2014 17:03         Verified By: Everlene Farrier, M.D.,  MRI:    29-Feb-16 09:52, MRI Brain Without Contrast   MRI Brain Without Contrast   REASON FOR EXAM:    CVA  COMMENTS:       PROCEDURE: MR  - MR BRAIN WO CONTRAST  - Jul 26 2014  9:52AM     CLINICAL DATA:  Weakness and numbness for the past 3 days. Initial  encounter.    EXAM:  MRI HEAD WITHOUT CONTRAST    TECHNIQUE:  Multiplanar, multiecho pulse sequences of the brain and surrounding  structures were obtained without intravenous contrast.  COMPARISON:  CT head 07/25/2014.    FINDINGS:  There is a RIGHT frontal subcortical white matter lesions showing  mild peripheral restricteddiffusion, approximately 1 cm in diameter  (image 28 series 5, image 22 series 9). The lesion displays T2 and  FLAIR hyperintensity. No other similar lesions.    No hemorrhage, hydrocephalus, or extra-axial fluid.    Mild premature cerebral and cerebellar atrophy. Paucity of white  matter disease elsewhere, perhaps single subcentimeter RIGHT  anterior frontal white matter focus. No subarachnoid blood. No areas  of large vessel infarction.  Flow voids are maintained throughout the carotid, basilar, and  vertebral arteries. There are no areas of chronic hemorrhage.  Pituitary, pineal, and cerebellar tonsils unremarkable. No upper  cervical lesions. Visualized calvarium, skull base, and upper  cervical osseous structures unremarkable. Scalp and extracranial  soft tissues, orbits, sinuses, and mastoids show no acute process.    Compared with prior CT head, the lesion is visualized on CT as an  area of hypoattenuation without hemorrhage or surrounding edema.     IMPRESSION:  RIGHT frontal subcortical white matter lesion showing mild  peripheral restricted diffusion approximately 1 cm in diameter. I  favor this representing a subacute infarct.  Mild atrophy with a paucity of white matter disease elsewhere.    Consider post infusion imaging for further evaluation if no  contraindications to gadolinium administration.      Electronically Signed    By:  Rolla Flatten M.D.    On: 07/26/2014 10:19         Verified By: Staci Righter, M.D.,  CT:    28-Feb-16 12:46, CT Head Without Contrast  CT Head Without Contrast  REASON FOR EXAM:    DIFFICULTY SPEAKING  COMMENTS:       PROCEDURE: CT  - CT HEAD WITHOUT CONTRAST  - Jul 25 2014 12:46PM     CLINICAL DATA:  Difficulty speaking, weakness, near syncope.    EXAM:  CT HEAD WITHOUT CONTRAST    TECHNIQUE:  Contiguous axial images were obtained from the base of the skull  through the vertex without intravenous contrast.    COMPARISON:  None.  FINDINGS:  Bony calvarium appears intact. No mass effect or midline shift is  noted. Ventricular size is within normal limits.There is no  evidence of mass lesion, hemorrhage or acute infarction. Focal low  density is noted in right frontal subcortical white matter  consistent with chronic ischemic change.     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 No acute intracranial abnormality seen.      Electronically Signed    By: Marijo Conception, M.D.    On: 07/25/2014 13:38     Verified By: Marveen Reeks, M.D.,   Radiology Impression: Radiology Impression: MRI personally reviewed by me and shows R frontal subcortical appearing infarct vs. demyelination   Impression/Recommendations: Recommendations:   prior notes reviewed by me reviewed by me   Probable R frontal subcortical infarct-  this is in an unusual territory to be stroke and could be demyelinating plaque or even a mass too;  this is currently asymptomatic;  worrisome is the fact that pt does not really have any direct risk factors for subcortical infarct check B12, Vit D, hypercoaguable w/u which can be followed in [facility] repeat MRI of brain w/wo contrast in 3 months continue ASA 22m daily no need for statin no need for Holter at this time [facility] will sign off, please call with additional  questions  Electronic Signatures: SJamison Neighbor(MD)  (Signed 01-Mar-16 16:32)  Authored: REFERRING PHYSICIAN, Consult, History of Present Illness, Review of Systems, PAST MEDICAL/SURGICAL HISTORY, HOME MEDICATIONS, ALLERGIES, Social/Family History, NURSING VITAL SIGNS, Physical Exam-, LAB RESULTS, RADIOLOGY RESULTS, Recommendations   Last Updated: 01-Mar-16 16:32 by SJamison Neighbor(MD)

## 2014-09-26 NOTE — H&P (Signed)
PATIENT NAME:  KEATON, STIREWALT MR#:  [id number] DATE OF BIRTH:  1950/07/29  DATE OF ADMISSION:  07/25/2014  PRIMARY CARE PHYSICIAN:  Einar Pheasant, MD  REFERRING PHYSICIAN:  Sheryl L. Benjaman Lobe, MD   CHIEF COMPLAINT: Weakness and syncope episodes twice today.   HISTORY OF PRESENT ILLNESS: A 64 year old Caucasian female with no previous past medical history, presented to the ED with above chief complaint.  The patient is alert, awake, oriented, in no acute distress.  According to patient and the patient's husband, the patient has not eaten or drank well for the past 2 days to do 1 of his friends passing away.  The patient feels generalized weakness in church this morning and she almost passed out.  In addition, she has difficulty with finding words, but it resolved later.  The patient continuously has generalized weakness. She feels her extremities are like rubber, unable to move or walk at all.  The patient did get a CAT scan of the brain, which showed remote stroke. The patient was treated with aspirin in the ED.  The patient denies any headache, but has dizziness. The patient denies any chest pain, palpitation, or leg swelling. The patient sometimes has chronic urine incontinence, but denies any seizure.   PAST MEDICAL HISTORY: None.   PAST SURGICAL HISTORY:  Cholecystectomy and partial hysterectomy.   SOCIAL HISTORY: No smoking or drinking or illicit drugs.   FAMILY HISTORY: Stroke in her father's side, diabetes in her mother's side.   ALLERGIES: None.   HOME MEDICATIONS: Probiotics for chronic diarrhea.   REVIEW OF SYSTEMS:  CONSTITUTIONAL: The patient denies any fever or chills. No headache, but has dizziness and generalized weakness.  EYES: No double vision, blurred vision. EARS, NOSE, AND THROAT: No postnasal drip, slurred speech, or dysphagia, but has some difficulty with finding words.  CARDIOVASCULAR: No chest pain, palpitation, orthopnea, or nocturnal dyspnea. No leg edema.   PULMONARY: No cough, sputum, shortness of breath, or hemoptysis.  GASTROINTESTINAL: No abdominal pain, nausea, vomiting, diarrhea. No melena or bloody stool.  GENITOURINARY: No dysuria, hematuria, and sometimes incontinence.  SKIN: No rash or jaundice.  NEUROLOGIC: Near syncope twice, no seizure. No loss of consciousness, but has generalized weakness.  HEMATOLOGY: No easy bleeding or bleeding.  ENDOCRINOLOGY: No polyuria, polydipsia, heat or cold intolerance.   PHYSICAL EXAMINATION: VITAL SIGNS: Temperature 97.6, blood pressure 125/68, pulse 65, O2 saturation 95% on room air.  GENERAL: The patient is alert, awake, oriented, in no acute distress.  HEENT: Pupils round and equal and reactive to light and accommodation. Moist oral mucosa. Clear oropharynx.  NECK: Supple. No JVD or carotid bruit. No lymphadenopathy. No thyromegaly.  CARDIOVASCULAR: S1 and S2, regular rate and rhythm. No murmurs or gallops.  PULMONARY: Bilateral air entry. No wheezing or rales. No use of accessory muscle to breathe.   ABDOMEN: Soft. No distention or tenderness. No organomegaly. Bowel sounds present.  EXTREMITIES: No edema, clubbing, or cyanosis. No calf tenderness. Bilateral pedal pulses present.  SKIN: No rash or jaundice.  NEUROLOGIC: AO x 3. No focal deficit. Power 3/5. Sensation intact. DTRs 2+.  IMAGING:  CAT scan of head showed old infarction involving right frontal subcortical white matter.  No acute intracranial abnormality.  LABORATORY DATA:  CBC in normal range, except platelets 146,000, glucose 110, BUN 16, creatinine 0.69, and sodium 146 and potassium 3.8, chloride 112, bicarbonate 26. Troponin less than 0.02. Urinalysis is negative.   EKG showed normal sinus rhythm at 65 BPM.   IMPRESSION: 1.  Near syncope. Rule out cerebrovascular accident. 2.  Generalized weakness.   3.  Obesity.  4.  Chronic diarrhea.  PLAN OF TREATMENT:   1.   The patient will be placed for observation. We will continue  telemonitor, get an MRI of brain, echocardiograph, and carotid duplex.  2.   I will check lipid panel, hemoglobin A1c, and continue aspirin and start statin.  3. We will give IV fluid support for dehydration and encourage patient's oral intake for dehydration.  4.  I discussed with patient the condition and plan of treatment with the patient, the patient's husband, and son. The patient wants full code.   TIME SPENT: About 62 minutes.   ____________________________ Demetrios Loll, MD qc:LT D: 07/25/2014 15:42:00 ET T: 07/25/2014 16:48:04 ET JOB#: [id number]  cc: Demetrios Loll, MD, <Dictator> Demetrios Loll MD ELECTRONICALLY SIGNED 07/26/2014 14:39

## 2014-10-04 ENCOUNTER — Ambulatory Visit
Admission: RE | Admit: 2014-10-04 | Discharge: 2014-10-04 | Disposition: A | Discharge status: Home / Self Care | Payer: BLUE CROSS/BLUE SHIELD | Source: Ambulatory Visit | Attending: Neurology | Admitting: Neurology

## 2014-10-04 DIAGNOSIS — G9389 Other specified disorders of brain: Secondary | ICD-10-CM | POA: Insufficient documentation

## 2014-10-04 DIAGNOSIS — G459 Transient cerebral ischemic attack, unspecified: Secondary | ICD-10-CM | POA: Diagnosis present

## 2014-10-04 DIAGNOSIS — I63511 Cerebral infarction due to unspecified occlusion or stenosis of right middle cerebral artery: Secondary | ICD-10-CM

## 2014-10-04 DIAGNOSIS — Z09 Encounter for follow-up examination after completed treatment for conditions other than malignant neoplasm: Secondary | ICD-10-CM | POA: Diagnosis not present

## 2014-10-04 MED ORDER — GADOBENATE DIMEGLUMINE 529 MG/ML IV SOLN
20.0000 mL | Freq: Once | INTRAVENOUS | Status: AC | PRN
  Administered 2014-10-04: 17 mL via INTRAVENOUS

## 2014-11-11 ENCOUNTER — Ambulatory Visit: Payer: BLUE CROSS/BLUE SHIELD | Admitting: Internal Medicine

## 2015-01-28 ENCOUNTER — Ambulatory Visit: Payer: BLUE CROSS/BLUE SHIELD | Admitting: Internal Medicine

## 2015-02-18 ENCOUNTER — Encounter: Payer: Self-pay | Admitting: Internal Medicine

## 2015-02-18 ENCOUNTER — Ambulatory Visit (INDEPENDENT_AMBULATORY_CARE_PROVIDER_SITE_OTHER): Payer: PPO | Admitting: Internal Medicine

## 2015-02-18 VITALS — BP 120/80 | HR 68 | Temp 98.1°F | Resp 18 | Ht 65.5 in | Wt 186.1 lb

## 2015-02-18 DIAGNOSIS — E7800 Pure hypercholesterolemia, unspecified: Secondary | ICD-10-CM

## 2015-02-18 DIAGNOSIS — M722 Plantar fascial fibromatosis: Secondary | ICD-10-CM

## 2015-02-18 DIAGNOSIS — D696 Thrombocytopenia, unspecified: Secondary | ICD-10-CM | POA: Diagnosis not present

## 2015-02-18 DIAGNOSIS — I639 Cerebral infarction, unspecified: Secondary | ICD-10-CM | POA: Diagnosis not present

## 2015-02-18 DIAGNOSIS — Z658 Other specified problems related to psychosocial circumstances: Secondary | ICD-10-CM

## 2015-02-18 DIAGNOSIS — F439 Reaction to severe stress, unspecified: Secondary | ICD-10-CM

## 2015-02-18 DIAGNOSIS — E669 Obesity, unspecified: Secondary | ICD-10-CM

## 2015-02-18 DIAGNOSIS — R7989 Other specified abnormal findings of blood chemistry: Secondary | ICD-10-CM

## 2015-02-18 DIAGNOSIS — R945 Abnormal results of liver function studies: Secondary | ICD-10-CM

## 2015-02-18 LAB — HEPATIC FUNCTION PANEL
ALK PHOS: 54 U/L (ref 39–117)
ALT: 30 U/L (ref 0–35)
AST: 27 U/L (ref 0–37)
Albumin: 4.4 g/dL (ref 3.5–5.2)
Bilirubin, Direct: 0.2 mg/dL (ref 0.0–0.3)
TOTAL PROTEIN: 6.8 g/dL (ref 6.0–8.3)
Total Bilirubin: 0.8 mg/dL (ref 0.2–1.2)

## 2015-02-18 LAB — LIPID PANEL
CHOL/HDL RATIO: 3
Cholesterol: 194 mg/dL (ref 0–200)
HDL: 60.5 mg/dL (ref >39.00)
LDL CALC: 103 mg/dL — AB (ref 0–99)
NonHDL: 133.88
TRIGLYCERIDES: 152 mg/dL — AB (ref 0.0–149.0)
VLDL: 30.4 mg/dL (ref 0.0–40.0)

## 2015-02-18 LAB — CBC WITH DIFFERENTIAL/PLATELET
BASOS ABS: 0 10*3/uL (ref 0.0–0.1)
BASOS PCT: 0.4 % (ref 0.0–3.0)
Eosinophils Absolute: 0.1 10*3/uL (ref 0.0–0.7)
Eosinophils Relative: 1.4 % (ref 0.0–5.0)
HCT: 43.8 % (ref 36.0–46.0)
Hemoglobin: 14.8 g/dL (ref 12.0–15.0)
LYMPHS ABS: 1.6 10*3/uL (ref 0.7–4.0)
Lymphocytes Relative: 31.4 % (ref 12.0–46.0)
MCHC: 33.8 g/dL (ref 30.0–36.0)
MCV: 93.5 fl (ref 78.0–100.0)
Monocytes Absolute: 0.3 10*3/uL (ref 0.1–1.0)
Monocytes Relative: 4.9 % (ref 3.0–12.0)
NEUTROS PCT: 61.9 % (ref 43.0–77.0)
Neutro Abs: 3.2 10*3/uL (ref 1.4–7.7)
Platelets: 163 10*3/uL (ref 150.0–400.0)
RBC: 4.69 Mil/uL (ref 3.87–5.11)
RDW: 13.6 % (ref 11.5–15.5)
WBC: 5.2 10*3/uL (ref 4.0–10.5)

## 2015-02-18 LAB — COMPREHENSIVE METABOLIC PANEL
ALT: 30 U/L (ref 0–35)
AST: 27 U/L (ref 0–37)
Albumin: 4.4 g/dL (ref 3.5–5.2)
Alkaline Phosphatase: 54 U/L (ref 39–117)
BILIRUBIN TOTAL: 0.8 mg/dL (ref 0.2–1.2)
BUN: 19 mg/dL (ref 6–23)
CO2: 30 meq/L (ref 19–32)
CREATININE: 0.7 mg/dL (ref 0.40–1.20)
Calcium: 10 mg/dL (ref 8.4–10.5)
Chloride: 105 mEq/L (ref 96–112)
GFR: 89.19 mL/min (ref >60.00)
GLUCOSE: 111 mg/dL — AB (ref 70–99)
Potassium: 4.8 mEq/L (ref 3.5–5.1)
Sodium: 141 mEq/L (ref 135–145)
Total Protein: 6.8 g/dL (ref 6.0–8.3)

## 2015-02-18 NOTE — Progress Notes (Signed)
Pre-visit discussion using our clinic review tool. No additional management support is needed unless otherwise documented below in the visit note.  

## 2015-02-18 NOTE — Progress Notes (Signed)
[name], female   DOB: [birth year], 64 y.o.   MRN: [id number]   Subjective:    [name], female    DOB: 1951-01-20, 64 y.o.   MRN: [id number]  HPI  Patient with past history of thrombocytopenia and hypercholesterolemia.  Also concern over recent stroke.  She comes in today for a scheduled follow up.  Seeing Dr Manuella Ghazi.  See his note for details.  Had EEG recently.  Has not had any other episodes.  Feels good.  Staying busy.  No cardiac symptoms with increased activity or exertion.  No sob.  No acid reflux reported.  No abdominal pain or cramping.  Bowels stable.  She is planning to have a colonoscopy in November.  Need to confirm ok to proceed given recent concern regarding CVA.     Past Medical History  Diagnosis Date  . Thrombocytopenia    Past Surgical History  Procedure Laterality Date  . Cholecystectomy  2007  . Tonsillectomy  1960  . Abdominal hysterectomy  2009  . Foot surgery  2007  . Dilation and curettage of uterus  2001 and 2006  . Vocal cord cyst removed     Family History  Problem Relation Age of Onset  . Diabetes    . Colon cancer Mother   . Stroke Paternal Grandmother   . Stroke Paternal Grandfather   . Prostate cancer Father   . Breast cancer Paternal Aunt     x2  . Uterine cancer Maternal Aunt    Social History   Social History  . Marital Status: Married    Spouse Name: N/A  . Number of Children: N/A  . Years of Education: N/A   Social History Main Topics  . Smoking status: Never Smoker   . Smokeless tobacco: Never Used  . Alcohol Use: No  . Drug Use: No  . Sexual Activity: Not Asked   Other Topics Concern  . None   Social History Narrative    Outpatient Encounter Prescriptions as of 02/18/2015  Medication Sig  . aspirin 81 MG tablet Take 81 mg by mouth daily.  . colestipol (COLESTID) 1 G tablet Take 1 g by mouth as needed.  Marland Kitchen OVER THE COUNTER MEDICATION Vitamin pack  . Saccharomyces boulardii  (FLORASTOR PO) Take by mouth daily as needed.   No facility-administered encounter medications on file as of 02/18/2015.    Review of Systems  Constitutional: Negative for appetite change and unexpected weight change.  HENT: Negative for congestion and sinus pressure.   Eyes: Negative for pain and visual disturbance.  Respiratory: Negative for cough, chest tightness and shortness of breath.   Cardiovascular: Negative for chest pain, palpitations and leg swelling.  Gastrointestinal: Negative for nausea, vomiting, diarrhea and abdominal distention.  Genitourinary: Negative for dysuria and difficulty urinating.  Musculoskeletal: Negative for back pain and joint swelling.  Skin: Negative for color change and rash.  Neurological: Negative for dizziness, light-headedness and headaches.  Hematological: Negative for adenopathy. Does not bruise/bleed easily.  Psychiatric/Behavioral: Negative for dysphoric mood and agitation.       Objective:    Physical Exam  Constitutional: She appears well-developed and well-nourished. No distress.  HENT:  Nose: Nose normal.  Mouth/Throat: Oropharynx is clear and moist.  Eyes: Conjunctivae are normal. Right eye exhibits no discharge. Left eye exhibits no discharge.  Neck: Neck supple. No thyromegaly present.  Cardiovascular: Normal rate and regular rhythm.   Pulmonary/Chest: Breath sounds normal. No respiratory distress. She has  no wheezes.  Abdominal: Soft. Bowel sounds are normal. There is no tenderness.  Musculoskeletal: She exhibits no edema or tenderness.  Lymphadenopathy:    She has no cervical adenopathy.  Skin: No rash noted. No erythema.  Psychiatric: She has a normal mood and affect. Her behavior is normal.    BP 120/80 mmHg  Pulse 68  Temp(Src) 98.1 F (36.7 C) (Oral)  Resp 18  Ht 5' 5.5" (1.664 m)  Wt 186 lb 2 oz (84.426 kg)  BMI 30.49 kg/m2  SpO2 95% Wt Readings from Last 3 Encounters:  02/18/15 186 lb 2 oz (84.426 kg)    07/30/14 180 lb 2 oz (81.704 kg)  07/05/14 184 lb 4 oz (83.575 kg)     Lab Results  Component Value Date   WBC 5.2 02/18/2015   HGB 14.8 02/18/2015   HCT 43.8 02/18/2015   PLT 163.0 02/18/2015   GLUCOSE 111* 02/18/2015   CHOL 194 02/18/2015   TRIG 152.0* 02/18/2015   HDL 60.50 02/18/2015   LDLCALC 103* 02/18/2015   ALT 30 02/18/2015   ALT 30 02/18/2015   AST 27 02/18/2015   AST 27 02/18/2015   NA 141 02/18/2015   K 4.8 02/18/2015   CL 105 02/18/2015   CREATININE 0.70 02/18/2015   BUN 19 02/18/2015   CO2 30 02/18/2015   TSH 1.75 07/26/2014   HGBA1C 5.7 07/25/2014    Mr Brain W Wo Contrast  10/04/2014   CLINICAL DATA:  Acute ischemic right MCA stroke. Episode 2 months ago with weakness throughout her body.  EXAM: MRI HEAD WITHOUT AND WITH CONTRAST  TECHNIQUE: Multiplanar, multiecho pulse sequences of the brain and surrounding structures were obtained without and with intravenous contrast.  CONTRAST:  24mL MULTIHANCE GADOBENATE DIMEGLUMINE 529 MG/ML IV SOLN  COMPARISON:  MRI of the brain 07/26/2014.  FINDINGS: There is persistent diffusion trace abnormality in the anterior aspect of the right coronal radiata. There is no restricted diffusion, representing some change since the prior exam. No other significant white matter disease is evident. No acute hemorrhage or mass lesion is present. The ventricles are of normal size.  No significant extraaxial fluid collection is present.  Postcontrast images demonstrate no pathologic enhancement. Specifically, there is no enhancement associated with the right coronal radiata lesion.  Flow is present in the major intracranial arteries. The globes and orbits are intact. The paranasal sinuses and the mastoid air cells are clear.  The skullbase is within normal limits. Midline structures are normal.  IMPRESSION: 1. Evolution of white matter lesion in the anterior right coronal radiata compatible with is subacute/early nonhemorrhagic infarct. 2. No  acute intracranial abnormality.   Electronically Signed   By: San Morelle M.D.   On: 10/04/2014 08:27       Assessment & Plan:   Problem List Items Addressed This Visit    CVA (cerebral vascular accident) - Primary    Seeing Dr Manuella Ghazi.  Has had no reoccurrence.  Carotid ultrasound revealed no significant stenosis.  ECHO - no thrombus.  MRI as outlined.  Had EEG - reports ok.  Continue daily aspirin.  Continue f/u with neurology.  Will need to confirm ok to proceed with colonoscopy.        Hypercholesteremia    Not on a statin medication.  Has been reluctant to start.  Low cholesterol diet and exercise.  Follow lipid panel.       Relevant Orders   Comprehensive metabolic panel (Completed)   Lipid panel (Completed)  Obesity (BMI 30-39.9)    Diet and exercise.  Follow.       Plantar fasciitis    Seeing a chiropractor.  Feeling better.        Stress    Handling stress well.   Follow.  Feels better.       Thrombocytopenia    Ultrasound revealed no splenomegaly.  Recheck cbc.        Relevant Orders   CBC with Differential/Platelet (Completed)    Other Visit Diagnoses    Abnormal liver function tests            Einar Pheasant, MD

## 2015-02-19 ENCOUNTER — Other Ambulatory Visit: Payer: Self-pay | Admitting: Internal Medicine

## 2015-02-19 DIAGNOSIS — R739 Hyperglycemia, unspecified: Secondary | ICD-10-CM

## 2015-02-19 NOTE — Progress Notes (Signed)
Order placed for f/u labs.  

## 2015-02-20 ENCOUNTER — Encounter: Payer: Self-pay | Admitting: Internal Medicine

## 2015-02-20 NOTE — Assessment & Plan Note (Signed)
Not on a statin medication.  Has been reluctant to start.  Low cholesterol diet and exercise.  Follow lipid panel.

## 2015-02-20 NOTE — Assessment & Plan Note (Signed)
Diet and exercise.  Follow.  

## 2015-02-20 NOTE — Assessment & Plan Note (Signed)
Seeing Dr Manuella Ghazi.  Has had no reoccurrence.  Carotid ultrasound revealed no significant stenosis.  ECHO - no thrombus.  MRI as outlined.  Had EEG - reports ok.  Continue daily aspirin.  Continue f/u with neurology.  Will need to confirm ok to proceed with colonoscopy.

## 2015-02-20 NOTE — Assessment & Plan Note (Signed)
Ultrasound revealed no splenomegaly.  Recheck cbc.

## 2015-02-20 NOTE — Assessment & Plan Note (Signed)
Seeing a Restaurant manager, [employer] food.  Feeling better.

## 2015-02-20 NOTE — Assessment & Plan Note (Signed)
Handling stress well.   Follow.  Feels better.

## 2015-02-21 ENCOUNTER — Encounter: Payer: Self-pay | Admitting: *Deleted

## 2015-03-21 ENCOUNTER — Other Ambulatory Visit: Payer: Self-pay | Admitting: Internal Medicine

## 2015-03-21 ENCOUNTER — Encounter: Payer: Self-pay | Admitting: Internal Medicine

## 2015-03-21 ENCOUNTER — Other Ambulatory Visit (INDEPENDENT_AMBULATORY_CARE_PROVIDER_SITE_OTHER): Payer: PPO

## 2015-03-21 DIAGNOSIS — R739 Hyperglycemia, unspecified: Secondary | ICD-10-CM

## 2015-03-21 LAB — HEMOGLOBIN A1C: Hgb A1c MFr Bld: 5.8 % (ref 4.6–6.5)

## 2015-03-22 LAB — GLUCOSE, FASTING: Glucose, Fasting: 101 mg/dL — ABNORMAL HIGH (ref 65–99)

## 2015-03-23 NOTE — Telephone Encounter (Signed)
Unread mychart message mailed to patient 

## 2015-03-25 ENCOUNTER — Telehealth: Payer: Self-pay

## 2015-03-25 NOTE — Telephone Encounter (Signed)
Spoke with patient, she wanted to discuss her lab results with [place].  Only the A1C was available.

## 2015-04-04 ENCOUNTER — Encounter: Payer: Self-pay | Admitting: *Deleted

## 2015-04-05 ENCOUNTER — Encounter: Payer: Self-pay | Admitting: Anesthesiology

## 2015-04-05 ENCOUNTER — Encounter
Admission: RE | Disposition: A | Discharge status: Home / Self Care | Payer: Self-pay | Source: Ambulatory Visit | Attending: Gastroenterology

## 2015-04-05 ENCOUNTER — Ambulatory Visit: Payer: PPO | Admitting: Anesthesiology

## 2015-04-05 ENCOUNTER — Ambulatory Visit
Admission: RE | Admit: 2015-04-05 | Discharge: 2015-04-05 | Disposition: A | Discharge status: Home / Self Care | Payer: PPO | Source: Ambulatory Visit | Attending: Gastroenterology | Admitting: Gastroenterology

## 2015-04-05 DIAGNOSIS — K648 Other hemorrhoids: Secondary | ICD-10-CM | POA: Insufficient documentation

## 2015-04-05 DIAGNOSIS — E7800 Pure hypercholesterolemia, unspecified: Secondary | ICD-10-CM | POA: Diagnosis not present

## 2015-04-05 DIAGNOSIS — Z7982 Long term (current) use of aspirin: Secondary | ICD-10-CM | POA: Insufficient documentation

## 2015-04-05 DIAGNOSIS — N814 Uterovaginal prolapse, unspecified: Secondary | ICD-10-CM | POA: Diagnosis not present

## 2015-04-05 DIAGNOSIS — K5730 Diverticulosis of large intestine without perforation or abscess without bleeding: Secondary | ICD-10-CM | POA: Diagnosis not present

## 2015-04-05 DIAGNOSIS — Z79899 Other long term (current) drug therapy: Secondary | ICD-10-CM | POA: Insufficient documentation

## 2015-04-05 DIAGNOSIS — Z8673 Personal history of transient ischemic attack (TIA), and cerebral infarction without residual deficits: Secondary | ICD-10-CM | POA: Insufficient documentation

## 2015-04-05 DIAGNOSIS — D122 Benign neoplasm of ascending colon: Secondary | ICD-10-CM | POA: Diagnosis not present

## 2015-04-05 DIAGNOSIS — D696 Thrombocytopenia, unspecified: Secondary | ICD-10-CM | POA: Insufficient documentation

## 2015-04-05 DIAGNOSIS — Z800 Family history of malignant neoplasm of digestive organs: Secondary | ICD-10-CM | POA: Insufficient documentation

## 2015-04-05 DIAGNOSIS — Z1211 Encounter for screening for malignant neoplasm of colon: Secondary | ICD-10-CM | POA: Insufficient documentation

## 2015-04-05 HISTORY — PX: COLONOSCOPY: SHX5424

## 2015-04-05 HISTORY — DX: Uterovaginal prolapse, unspecified: N81.4

## 2015-04-05 HISTORY — DX: Pure hypercholesterolemia, unspecified: E78.00

## 2015-04-05 HISTORY — DX: Cerebral infarction, unspecified: I63.9

## 2015-04-05 LAB — HM COLONOSCOPY

## 2015-04-05 SURGERY — COLONOSCOPY
Anesthesia: General

## 2015-04-05 MED ORDER — LIDOCAINE HCL (CARDIAC) 20 MG/ML IV SOLN
INTRAVENOUS | Status: DC | PRN
Start: 2015-04-05 — End: 2015-04-05
  Administered 2015-04-05: 60 mg via INTRAVENOUS

## 2015-04-05 MED ORDER — MIDAZOLAM HCL 5 MG/5ML IJ SOLN
INTRAMUSCULAR | Status: DC | PRN
  Administered 2015-04-05: 1 mg via INTRAVENOUS

## 2015-04-05 MED ORDER — FENTANYL CITRATE (PF) 100 MCG/2ML IJ SOLN
INTRAMUSCULAR | Status: DC | PRN
  Administered 2015-04-05: 50 ug via INTRAVENOUS

## 2015-04-05 MED ORDER — PROPOFOL 500 MG/50ML IV EMUL
INTRAVENOUS | Status: DC | PRN
  Administered 2015-04-05: 120 ug/kg/min via INTRAVENOUS

## 2015-04-05 MED ORDER — SODIUM CHLORIDE 0.9 % IV SOLN: INTRAVENOUS | Status: DC

## 2015-04-05 MED ORDER — SODIUM CHLORIDE 0.9 % IV SOLN
INTRAVENOUS | Status: DC
  Administered 2015-04-05: 1000 mL via INTRAVENOUS

## 2015-04-05 MED ORDER — PROPOFOL 10 MG/ML IV BOLUS
INTRAVENOUS | Status: DC | PRN
  Administered 2015-04-05: 40 mg via INTRAVENOUS

## 2015-04-05 MED ORDER — SPOT INK MARKER SYRINGE KIT
PACK | SUBMUCOSAL | Status: DC | PRN
  Administered 2015-04-05: 2 mL via SUBMUCOSAL

## 2015-04-05 NOTE — Anesthesia Postprocedure Evaluation (Signed)
  Anesthesia Post-op Note  Patient: Victoria Harris  Procedure(s) Performed: Procedure(s): COLONOSCOPY (N/A)  Anesthesia type:General  Patient location: PACU  Post pain: Pain level controlled  Post assessment: Post-op Vital signs reviewed, Patient's Cardiovascular Status Stable, Respiratory Function Stable, Patent Airway and No signs of Nausea or vomiting  Post vital signs: Reviewed and stable  Last Vitals:  Filed Vitals:   04/05/15 0940  BP: 130/76  Pulse: 58  Temp:   Resp: 15    Level of consciousness: awake, alert  and patient cooperative  Complications: No apparent anesthesia complications

## 2015-04-05 NOTE — Anesthesia Preprocedure Evaluation (Addendum)
Anesthesia Evaluation  Patient identified by MRN, date of birth, ID band Patient awake    Reviewed: Allergy & Precautions, NPO status , Patient's Chart, lab work & pertinent test results, reviewed documented beta blocker date and time   Airway Mallampati: II  TM Distance: >3 FB     Dental  (+) Chipped   Pulmonary           Cardiovascular      Neuro/Psych CVA, No Residual Symptoms    GI/Hepatic   Endo/Other    Renal/GU      Musculoskeletal   Abdominal   Peds  Hematology   Anesthesia Other Findings   Reproductive/Obstetrics                            Anesthesia Physical Anesthesia Plan  ASA: III  Anesthesia Plan: General   Post-op Pain Management:    Induction: Intravenous  Airway Management Planned: Nasal Cannula  Additional Equipment:   Intra-op Plan:   Post-operative Plan:   Informed Consent: I have reviewed the patients History and Physical, chart, labs and discussed the procedure including the risks, benefits and alternatives for the proposed anesthesia with the patient or authorized representative who has indicated his/her understanding and acceptance.     Plan Discussed with: CRNA  Anesthesia Plan Comments:         Anesthesia Quick Evaluation

## 2015-04-05 NOTE — Transfer of Care (Signed)
Immediate Anesthesia Transfer of Care Note  Patient: Victoria Harris  Procedure(s) Performed: Procedure(s): COLONOSCOPY (N/A)  Patient Location: PACU  Anesthesia Type:General  Level of Consciousness: awake, alert , oriented and patient cooperative  Airway & Oxygen Therapy: Patient Spontanous Breathing and Patient connected to nasal cannula oxygen  Post-op Assessment: Report given to RN and Post -op Vital signs reviewed and stable  Post vital signs: Reviewed and stable  Last Vitals:  Filed Vitals:   04/05/15 0906  BP: 103/73  Pulse: 69  Temp: 36 C  Resp: 17    Complications: No apparent anesthesia complications

## 2015-04-05 NOTE — H&P (Signed)
Outpatient short stay form Pre-procedure 04/05/2015 8:12 AM Lollie Sails MD  Primary Physician: Dr. Einar Pheasant  Reason for visit:  Colonoscopy  History of present illness:  Patient is a 64 year old female presenting today for repeat colonoscopy. She has a family history colon cancer in a primary relative, mother. She tolerated her prep well. She held her 81 mg aspirin several days ago. She takes no other aspirin products or thinning agents.    Current facility-administered medications:  .  0.9 %  sodium chloride infusion, , Intravenous, Continuous, Lollie Sails, MD, Last Rate: 20 mL/hr at 04/05/15 0809, 1,000 mL at 04/05/15 0809 .  0.9 %  sodium chloride infusion, , Intravenous, Continuous, Lollie Sails, MD  Prescriptions prior to admission  Medication Sig Dispense Refill Last Dose  . aspirin 81 MG tablet Take 81 mg by mouth daily.   Past Week at Unknown time  . colestipol (COLESTID) 1 G tablet Take 1 g by mouth as needed.   Past Week at Unknown time  . OVER THE COUNTER MEDICATION Vitamin pack   Past Week at Unknown time  . Saccharomyces boulardii (FLORASTOR PO) Take by mouth daily as needed.   Past Week at Unknown time     Allergies  Allergen Reactions  . No Known Drug Allergy      Past Medical History  Diagnosis Date  . Thrombocytopenia ([place])   . Stroke (cerebrum) ([place])   . Uterus descensus   . Hypercholesterolemia     Review of systems:      Physical Exam    Heart and lungs: Regular rate and rhythm without rub or gallop, lungs are bilaterally clear    HEENT: Normocephalic atraumatic eyes are anicteric    Other:     Pertinant exam for procedure: Soft nontender nondistended bowel sounds positive normoactive    Planned proceedures: Colonoscopy and indicated procedures. I have discussed the risks benefits and complications of procedures to include not limited to bleeding, infection, perforation and the risk of sedation and the patient wishes to  proceed.    Lollie Sails, MD Gastroenterology 04/05/2015  8:12 AM

## 2015-04-05 NOTE — Op Note (Signed)
[facility] Gastroenterology Patient Name: Victoria Harris Procedure Date: 04/05/2015 8:12 AM MRN: [id number] Account #: [id number] Date of Birth: 23-Jan-1951 Admit Type: Outpatient Age: 63 Room: [facility] ENDO ROOM 3 Gender: Female Note Status: Finalized Procedure:         Colonoscopy Indications:       Family history of colon cancer in a first-degree relative Providers:         Lollie Sails, MD Referring MD:      Einar Pheasant, MD (Referring MD) Medicines:         Monitored Anesthesia Care Complications:     No immediate complications. Procedure:         Pre-Anesthesia Assessment:                    - ASA Grade Assessment: III - A patient with severe                     systemic disease.                    After obtaining informed consent, the colonoscope was                     passed under direct vision. Throughout the procedure, the                     patient's blood pressure, pulse, and oxygen saturations                     were monitored continuously. The Colonoscope was                     introduced through the anus and advanced to the the cecum,                     identified by appendiceal orifice and ileocecal valve. The                     colonoscopy was performed without difficulty. The patient                     tolerated the procedure well. The quality of the bowel                     preparation was fair. Findings:      Multiple small and large-mouthed diverticula were found in the entire       colon.      A localized area of mildly granular mucosa was found in the mid       ascending colon. Biopsies were taken with a cold forceps for histology,       with the abnormal area being removed with multiple passes of a cold       forcep. Area was tattooed with an injection of 2 mL of [place] ink.      Non-bleeding internal hemorrhoids were found during anoscopy. The       hemorrhoids were small. Impression:        - Diverticulosis in the entire  examined colon.                    - Granular mucosa in the mid ascending colon. Biopsied.                     Tattooed. Recommendation:    -  Await pathology results.                    - Telephone GI clinic for pathology results in 1 week. Procedure Code(s): --- Professional ---                    [phone], Colonoscopy, flexible; with biopsy, single or                     multiple                    45381, Colonoscopy, flexible; with directed submucosal                     injection(s), any substance Diagnosis Code(s): --- Professional ---                    569.89, Other specified disorders of intestine                    V16.0, Family history of malignant neoplasm of                     gastrointestinal tract                    562.10, Diverticulosis of colon (without mention of                     hemorrhage) CPT copyright 2014 American Medical Association. All rights reserved. The codes documented in this report are preliminary and upon coder review may  be revised to meet current compliance requirements. Lollie Sails, MD 04/05/2015 9:03:13 AM This report has been signed electronically. Number of Addenda: 0 Note Initiated On: 04/05/2015 8:12 AM Scope Withdrawal Time: 0 hours 13 minutes 8 seconds  Total Procedure Duration: 0 hours 33 minutes 32 seconds       [facility]

## 2015-04-06 LAB — SURGICAL PATHOLOGY

## 2015-04-07 ENCOUNTER — Encounter: Payer: Self-pay | Admitting: Internal Medicine

## 2015-04-07 DIAGNOSIS — Z86010 Personal history of colonic polyps: Secondary | ICD-10-CM | POA: Insufficient documentation

## 2015-04-29 ENCOUNTER — Encounter: Payer: Self-pay | Admitting: Internal Medicine

## 2015-05-25 ENCOUNTER — Ambulatory Visit (INDEPENDENT_AMBULATORY_CARE_PROVIDER_SITE_OTHER): Payer: PPO | Admitting: *Deleted

## 2015-05-25 DIAGNOSIS — Z23 Encounter for immunization: Secondary | ICD-10-CM

## 2015-05-31 ENCOUNTER — Ambulatory Visit: Payer: PPO

## 2015-06-08 ENCOUNTER — Telehealth: Payer: Self-pay | Admitting: Internal Medicine

## 2015-06-08 DIAGNOSIS — R739 Hyperglycemia, unspecified: Secondary | ICD-10-CM

## 2015-06-08 DIAGNOSIS — D696 Thrombocytopenia, unspecified: Secondary | ICD-10-CM

## 2015-06-08 DIAGNOSIS — E7800 Pure hypercholesterolemia, unspecified: Secondary | ICD-10-CM

## 2015-06-08 NOTE — Telephone Encounter (Signed)
Called pt to reschedule appt on 07/21/15 pt wanted to know if she needed labs done before appt ( NO labs in system).. Please advise so I can schedule.. Thanks

## 2015-06-08 NOTE — Telephone Encounter (Signed)
Last full panel of labs was done on 02/18/15, please advise if they are needed prior to next appt.  If so please order. Thanks

## 2015-06-08 NOTE — Telephone Encounter (Signed)
Called pt lab appt made

## 2015-06-08 NOTE — Telephone Encounter (Signed)
Order placed for lab.  Ok to schedule lab 1-2 days before her appt.  Thanks

## 2015-07-13 ENCOUNTER — Other Ambulatory Visit (INDEPENDENT_AMBULATORY_CARE_PROVIDER_SITE_OTHER): Payer: PPO

## 2015-07-13 DIAGNOSIS — R739 Hyperglycemia, unspecified: Secondary | ICD-10-CM

## 2015-07-13 DIAGNOSIS — D696 Thrombocytopenia, unspecified: Secondary | ICD-10-CM | POA: Diagnosis not present

## 2015-07-13 DIAGNOSIS — H40003 Preglaucoma, unspecified, bilateral: Secondary | ICD-10-CM | POA: Diagnosis not present

## 2015-07-13 DIAGNOSIS — E7800 Pure hypercholesterolemia, unspecified: Secondary | ICD-10-CM | POA: Diagnosis not present

## 2015-07-13 LAB — CBC WITH DIFFERENTIAL/PLATELET
BASOS ABS: 0 10*3/uL (ref 0.0–0.1)
Basophils Relative: 0.3 % (ref 0.0–3.0)
EOS ABS: 0.1 10*3/uL (ref 0.0–0.7)
Eosinophils Relative: 1.2 % (ref 0.0–5.0)
HEMATOCRIT: 42.3 % (ref 36.0–46.0)
HEMOGLOBIN: 14.4 g/dL (ref 12.0–15.0)
LYMPHS PCT: 27.4 % (ref 12.0–46.0)
Lymphs Abs: 1.3 10*3/uL (ref 0.7–4.0)
MCHC: 33.9 g/dL (ref 30.0–36.0)
MCV: 92.5 fl (ref 78.0–100.0)
MONOS PCT: 4.6 % (ref 3.0–12.0)
Monocytes Absolute: 0.2 10*3/uL (ref 0.1–1.0)
NEUTROS ABS: 3.3 10*3/uL (ref 1.4–7.7)
Neutrophils Relative %: 66.5 % (ref 43.0–77.0)
PLATELETS: 162 10*3/uL (ref 150.0–400.0)
RBC: 4.57 Mil/uL (ref 3.87–5.11)
RDW: 13.3 % (ref 11.5–15.5)
WBC: 4.9 10*3/uL (ref 4.0–10.5)

## 2015-07-13 LAB — LIPID PANEL
Cholesterol: 203 mg/dL — ABNORMAL HIGH (ref 0–200)
HDL: 67.2 mg/dL (ref >39.00)
LDL Cholesterol: 121 mg/dL — ABNORMAL HIGH (ref 0–99)
NONHDL: 135.97
Total CHOL/HDL Ratio: 3
Triglycerides: 75 mg/dL (ref 0.0–149.0)
VLDL: 15 mg/dL (ref 0.0–40.0)

## 2015-07-13 LAB — BASIC METABOLIC PANEL
BUN: 19 mg/dL (ref 6–23)
CHLORIDE: 106 meq/L (ref 96–112)
CO2: 30 meq/L (ref 19–32)
Calcium: 10 mg/dL (ref 8.4–10.5)
Creatinine, Ser: 0.67 mg/dL (ref 0.40–1.20)
GFR: 93.69 mL/min (ref >60.00)
GLUCOSE: 117 mg/dL — AB (ref 70–99)
POTASSIUM: 4.4 meq/L (ref 3.5–5.1)
SODIUM: 142 meq/L (ref 135–145)

## 2015-07-13 LAB — HEPATIC FUNCTION PANEL
ALK PHOS: 53 U/L (ref 39–117)
ALT: 25 U/L (ref 0–35)
AST: 25 U/L (ref 0–37)
Albumin: 4.6 g/dL (ref 3.5–5.2)
Bilirubin, Direct: 0.1 mg/dL (ref 0.0–0.3)
TOTAL PROTEIN: 6.6 g/dL (ref 6.0–8.3)
Total Bilirubin: 0.7 mg/dL (ref 0.2–1.2)

## 2015-07-13 LAB — HEMOGLOBIN A1C: Hgb A1c MFr Bld: 6 % (ref 4.6–6.5)

## 2015-07-13 LAB — TSH: TSH: 1.16 u[IU]/mL (ref 0.35–4.50)

## 2015-07-13 NOTE — Progress Notes (Signed)
Patient here today for lab draw only. 

## 2015-07-14 ENCOUNTER — Encounter: Payer: Self-pay | Admitting: Internal Medicine

## 2015-07-19 ENCOUNTER — Other Ambulatory Visit (HOSPITAL_COMMUNITY)
Admission: RE | Admit: 2015-07-19 | Discharge: 2015-07-19 | Disposition: A | Discharge status: Home / Self Care | Payer: PPO | Source: Ambulatory Visit | Attending: Internal Medicine | Admitting: Internal Medicine

## 2015-07-19 ENCOUNTER — Ambulatory Visit (INDEPENDENT_AMBULATORY_CARE_PROVIDER_SITE_OTHER): Payer: PPO | Admitting: Internal Medicine

## 2015-07-19 ENCOUNTER — Other Ambulatory Visit: Payer: Self-pay | Admitting: Nurse Practitioner

## 2015-07-19 ENCOUNTER — Other Ambulatory Visit: Payer: Self-pay | Admitting: Internal Medicine

## 2015-07-19 ENCOUNTER — Encounter: Payer: Self-pay | Admitting: Internal Medicine

## 2015-07-19 VITALS — BP 120/80 | HR 59 | Temp 98.3°F | Resp 18 | Ht 64.75 in | Wt 179.5 lb

## 2015-07-19 DIAGNOSIS — E7800 Pure hypercholesterolemia, unspecified: Secondary | ICD-10-CM

## 2015-07-19 DIAGNOSIS — Z124 Encounter for screening for malignant neoplasm of cervix: Secondary | ICD-10-CM

## 2015-07-19 DIAGNOSIS — I639 Cerebral infarction, unspecified: Secondary | ICD-10-CM | POA: Diagnosis not present

## 2015-07-19 DIAGNOSIS — D696 Thrombocytopenia, unspecified: Secondary | ICD-10-CM

## 2015-07-19 DIAGNOSIS — E669 Obesity, unspecified: Secondary | ICD-10-CM

## 2015-07-19 DIAGNOSIS — R739 Hyperglycemia, unspecified: Secondary | ICD-10-CM

## 2015-07-19 DIAGNOSIS — Z86010 Personal history of colonic polyps: Secondary | ICD-10-CM

## 2015-07-19 DIAGNOSIS — Z1231 Encounter for screening mammogram for malignant neoplasm of breast: Secondary | ICD-10-CM

## 2015-07-19 DIAGNOSIS — Z23 Encounter for immunization: Secondary | ICD-10-CM | POA: Diagnosis not present

## 2015-07-19 DIAGNOSIS — Z658 Other specified problems related to psychosocial circumstances: Secondary | ICD-10-CM

## 2015-07-19 DIAGNOSIS — Z1151 Encounter for screening for human papillomavirus (HPV): Secondary | ICD-10-CM | POA: Diagnosis not present

## 2015-07-19 DIAGNOSIS — Z0000 Encounter for general adult medical examination without abnormal findings: Secondary | ICD-10-CM | POA: Diagnosis not present

## 2015-07-19 DIAGNOSIS — Z01419 Encounter for gynecological examination (general) (routine) without abnormal findings: Secondary | ICD-10-CM | POA: Insufficient documentation

## 2015-07-19 DIAGNOSIS — F439 Reaction to severe stress, unspecified: Secondary | ICD-10-CM

## 2015-07-19 NOTE — Progress Notes (Signed)
[name], female   DOB: 21-Nov-1950, 65 y.o.   MRN: [id number]   Subjective:    [name], female    DOB: [birth year], 65 y.o.   MRN: [id number]  HPI  The patient is here for annual Medicare wellness examination and management of other chronic and acute problems.   The risk factors are reflected in the social history.  The roster of all physicians providing medical care to patient - include Dr Manuella Ghazi - neurology, Dr Gustavo Lah - gastroenterology and Dr Elvina Mattes - podiatry.    Activities of daily living:  The patient is 100% independent in all ADLs: dressing, toileting, feeding as well as independent mobility  Home safety :  She wears seatbelts.  There are firearms at home. There is no violence in the home.   There is no risks for hepatitis, STDs or HIV. There is no   history of blood transfusion. She has no travel history to infectious disease endemic areas of the world.  The patient has seen her dentist.  She has seen her eye doctor in the last year.  No hearing change reported.  She  does not  have excessive sun exposure.   Diet: the importance of a healthy diet is discussed. She does try to watch her diet.   The benefits of regular aerobic exercise were discussed. She does do some walking.   Depression screen: there are no signs or vegative symptoms of depression- irritability, change in appetite, anhedonia, sadness/tearfullness.  Cognitive assessment: the patient manages all their financial and personal affairs and is actively engaged. They could relate day,date,year and events; recalled 3/3 objects at 3 minutes.    The following portions of the patient's history were reviewed and updated as appropriate: allergies, current medications, past family history, past medical history,  past surgical history, past social history  and problem list.  Visual acuity was not assessed per patient preference since she has regular follow up with her  ophthalmologist. Hearing and body mass index were assessed and reviewed.   During the course of the visit the patient was educated and counseled about appropriate screening and preventive services including : fall prevention , diabetes screening, nutrition counseling, colorectal cancer screening, and recommended immunizations.  She also is here for her physical exam.  She is doing relatively well.  Tries to stay active.  No cardiac symptoms with increased activity or exertion.  She is seeing opthalmology.  Pressure increased.  Using drops.  No abdominal pain or cramping.  Bowels stable.       Past Medical History  Diagnosis Date  . Thrombocytopenia ([place])   . Stroke (cerebrum) ([place])   . Uterus descensus   . Hypercholesterolemia    Past Surgical History  Procedure Laterality Date  . Cholecystectomy  2007  . Tonsillectomy  1960  . Abdominal hysterectomy  2009  . Foot surgery  2007  . Dilation and curettage of uterus  2001 and 2006  . Vocal cord cyst removed    . Colonoscopy N/A 04/05/2015    Procedure: COLONOSCOPY;  Surgeon: Lollie Sails, MD;  Location: [facility] ENDOSCOPY;  Service: Endoscopy;  Laterality: N/A;   Family History  Problem Relation Age of Onset  . Diabetes    . Colon cancer Mother   . Stroke Paternal Grandmother   . Stroke Paternal Grandfather   . Prostate cancer Father   . Breast cancer Paternal Aunt     x2  . Uterine cancer Maternal Aunt  Social History   Social History  . Marital Status: Married    Spouse Name: N/A  . Number of Children: N/A  . Years of Education: N/A   Social History Main Topics  . Smoking status: Never Smoker   . Smokeless tobacco: Never Used  . Alcohol Use: No  . Drug Use: No  . Sexual Activity: Not Asked   Other Topics Concern  . None   Social History Narrative    Outpatient Encounter Prescriptions as of 07/19/2015  Medication Sig  . aspirin 81 MG tablet Take 81 mg by mouth daily.  . Cholecalciferol (VITAMIN D-1000 MAX  ST) 1000 units tablet Take by mouth.  . colestipol (COLESTID) 1 G tablet Take 1 g by mouth as needed.  Marland Kitchen OVER THE COUNTER MEDICATION Vitamin pack  . Saccharomyces boulardii (FLORASTOR PO) Take by mouth daily as needed.  . timolol (BETIMOL) 0.5 % ophthalmic solution Place 1 drop into both eyes 2 (two) times daily.   No facility-administered encounter medications on file as of 07/19/2015.    Review of Systems  Constitutional: Negative for appetite change and unexpected weight change.  HENT: Negative for congestion and sinus pressure.   Eyes: Negative for pain and visual disturbance.  Respiratory: Negative for cough, chest tightness and shortness of breath.   Cardiovascular: Negative for chest pain, palpitations and leg swelling.  Gastrointestinal: Negative for nausea, vomiting, abdominal pain and diarrhea.  Genitourinary: Negative for dysuria and difficulty urinating.  Musculoskeletal: Negative for back pain and joint swelling.  Skin: Negative for color change and rash.  Neurological: Negative for dizziness, light-headedness and headaches.  Hematological: Negative for adenopathy. Does not bruise/bleed easily.  Psychiatric/Behavioral: Negative for dysphoric mood and agitation.       Objective:     Blood pressure rechecked by me:  128/78  Physical Exam  Constitutional: She is oriented to person, place, and time. She appears well-developed and well-nourished. No distress.  HENT:  Nose: Nose normal.  Mouth/Throat: Oropharynx is clear and moist.  Eyes: Right eye exhibits no discharge. Left eye exhibits no discharge. No scleral icterus.  Neck: Neck supple. No thyromegaly present.  Cardiovascular: Normal rate and regular rhythm.   Pulmonary/Chest: Breath sounds normal. No accessory muscle usage. No tachypnea. No respiratory distress. She has no decreased breath sounds. She has no wheezes. She has no rhonchi. Right breast exhibits no inverted nipple, no mass, no nipple discharge and no  tenderness (no axillary adenopathy). Left breast exhibits no inverted nipple, no mass, no nipple discharge and no tenderness (no axilarry adenopathy).  Abdominal: Soft. Bowel sounds are normal. There is no tenderness.  Genitourinary:  Normal external genitalia.  Vaginal vault without lesions.  Cervix identified.  Pap smear performed.  Could not appreciate any adnexal masses or tenderness.    Musculoskeletal: She exhibits no edema or tenderness.  Lymphadenopathy:    She has no cervical adenopathy.  Neurological: She is alert and oriented to person, place, and time.  Skin: Skin is warm. No rash noted. No erythema.  Psychiatric: She has a normal mood and affect. Her behavior is normal.    BP 120/80 mmHg  Pulse 59  Temp(Src) 98.3 F (36.8 C) (Oral)  Resp 18  Ht 5' 4.75" (1.645 m)  Wt 179 lb 8 oz (81.421 kg)  BMI 30.09 kg/m2  SpO2 93% Wt Readings from Last 3 Encounters:  07/19/15 179 lb 8 oz (81.421 kg)  04/05/15 175 lb (79.379 kg)  02/18/15 186 lb 2 oz (84.426 kg)  Lab Results  Component Value Date   WBC 4.9 07/13/2015   HGB 14.4 07/13/2015   HCT 42.3 07/13/2015   PLT 162.0 07/13/2015   GLUCOSE 117* 07/13/2015   CHOL 203* 07/13/2015   TRIG 75.0 07/13/2015   HDL 67.20 07/13/2015   LDLCALC 121* 07/13/2015   ALT 25 07/13/2015   AST 25 07/13/2015   NA 142 07/13/2015   K 4.4 07/13/2015   CL 106 07/13/2015   CREATININE 0.67 07/13/2015   BUN 19 07/13/2015   CO2 30 07/13/2015   TSH 1.16 07/13/2015   HGBA1C 6.0 07/13/2015       Assessment & Plan:   Problem List Items Addressed This Visit    CVA (cerebral vascular accident) ([place])    Followed by Dr Manuella Ghazi.  Has had no reoccurrence.  Continue f/u with cardiology.  Continue aspirin daily.        Health care maintenance    Physical today 07/19/15.  Mammogram scheduled for 07/29/15.  Colonoscopy 04/05/15.  Recommended f/u colonoscopy in one year.        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         Hypercholesteremia    Low cholesterol diet and exercise.  Follow lipid panel.  Not on statin medication.        Relevant Orders   Hepatic function panel   Lipid panel   Obesity (BMI 30-39.9)    Diet and exercise.  Follow.       Stress    Handling stress relatively well.  Follow.        Thrombocytopenia ([facility])    Ultrasound revealed no splenomegaly.  Follow cbc.        Other Visit Diagnoses    Routine general medical examination at a health care facility    -  Primary    Pap smear for cervical cancer screening        Relevant Orders    Cytology - PAP (Completed)    Need for prophylactic vaccination against Streptococcus pneumoniae (pneumococcus)        Relevant Orders    Pneumococcal conjugate vaccine 13-valent (Completed)    Hyperglycemia        Relevant Orders    Hemoglobin [id number]    Basic metabolic panel       Discussed preventative measures with the pt.  Up to date with colonoscopy.  Schedule mammogram.  prevnar given.  Discussed immunizations.    Einar Pheasant, MD

## 2015-07-19 NOTE — Progress Notes (Signed)
Pre-visit discussion using our clinic review tool. No additional management support is needed unless otherwise documented below in the visit note.  

## 2015-07-21 ENCOUNTER — Encounter: Payer: PPO | Admitting: Internal Medicine

## 2015-07-21 LAB — CYTOLOGY - PAP

## 2015-07-22 ENCOUNTER — Encounter: Payer: Self-pay | Admitting: Internal Medicine

## 2015-07-27 DIAGNOSIS — H40003 Preglaucoma, unspecified, bilateral: Secondary | ICD-10-CM | POA: Diagnosis not present

## 2015-07-27 NOTE — Telephone Encounter (Signed)
Unread mychart message mailed to patient 

## 2015-07-29 ENCOUNTER — Ambulatory Visit
Admission: RE | Admit: 2015-07-29 | Discharge: 2015-07-29 | Disposition: A | Discharge status: Home / Self Care | Payer: PPO | Source: Ambulatory Visit | Attending: Internal Medicine | Admitting: Internal Medicine

## 2015-07-29 DIAGNOSIS — Z1231 Encounter for screening mammogram for malignant neoplasm of breast: Secondary | ICD-10-CM | POA: Insufficient documentation

## 2015-08-01 ENCOUNTER — Encounter: Payer: Self-pay | Admitting: Internal Medicine

## 2015-08-01 NOTE — Assessment & Plan Note (Signed)
Low cholesterol diet and exercise.  Follow lipid panel.  Not on statin medication.

## 2015-08-01 NOTE — Assessment & Plan Note (Signed)
Ultrasound revealed no splenomegaly.  Follow cbc.

## 2015-08-01 NOTE — Assessment & Plan Note (Signed)
Followed by Dr Manuella Ghazi.  Has had no reoccurrence.  Continue f/u with cardiology.  Continue aspirin daily.

## 2015-08-01 NOTE — Assessment & Plan Note (Signed)
Diet and exercise.  Follow.  

## 2015-08-01 NOTE — Assessment & Plan Note (Addendum)
Physical today 07/19/15.  Mammogram scheduled for 07/29/15.  Colonoscopy 04/05/15.  Recommended f/u colonoscopy in one year.

## 2015-08-01 NOTE — Assessment & Plan Note (Signed)
Colonoscopy 04/05/15 - serrated adenoma.

## 2015-08-01 NOTE — Assessment & Plan Note (Signed)
Handling stress relatively well.  Follow.

## 2015-08-02 DIAGNOSIS — H401131 Primary open-angle glaucoma, bilateral, mild stage: Secondary | ICD-10-CM | POA: Diagnosis not present

## 2015-08-05 ENCOUNTER — Telehealth: Payer: Self-pay | Admitting: Internal Medicine

## 2015-08-05 DIAGNOSIS — L853 Xerosis cutis: Secondary | ICD-10-CM

## 2015-08-05 DIAGNOSIS — L918 Other hypertrophic disorders of the skin: Secondary | ICD-10-CM

## 2015-08-05 NOTE — Telephone Encounter (Signed)
Pt is requesting a referral for a dermatologist b/c of moles, skin tags and a patch of dry skin. Please advise, thanks

## 2015-08-05 NOTE — Telephone Encounter (Signed)
Pt called wanting to get a referral to the dermatologist. Pt suggested Dr Hortencia Conradi in [facility] skin and dermatology  Call pt @ [phone] or cell [phone]. Thank you!

## 2015-08-05 NOTE — Telephone Encounter (Signed)
[id number]

## 2015-08-06 NOTE — Telephone Encounter (Signed)
Order placed for dermatology referral.  

## 2015-08-12 DIAGNOSIS — C44319 Basal cell carcinoma of skin of other parts of face: Secondary | ICD-10-CM | POA: Diagnosis not present

## 2015-08-17 DIAGNOSIS — H401131 Primary open-angle glaucoma, bilateral, mild stage: Secondary | ICD-10-CM | POA: Diagnosis not present

## 2015-09-09 DIAGNOSIS — C44319 Basal cell carcinoma of skin of other parts of face: Secondary | ICD-10-CM | POA: Diagnosis not present

## 2015-11-03 DIAGNOSIS — R404 Transient alteration of awareness: Secondary | ICD-10-CM | POA: Diagnosis not present

## 2015-11-18 ENCOUNTER — Other Ambulatory Visit (INDEPENDENT_AMBULATORY_CARE_PROVIDER_SITE_OTHER): Payer: PPO

## 2015-11-18 DIAGNOSIS — H401131 Primary open-angle glaucoma, bilateral, mild stage: Secondary | ICD-10-CM | POA: Diagnosis not present

## 2015-11-18 DIAGNOSIS — E7800 Pure hypercholesterolemia, unspecified: Secondary | ICD-10-CM

## 2015-11-18 DIAGNOSIS — R739 Hyperglycemia, unspecified: Secondary | ICD-10-CM | POA: Diagnosis not present

## 2015-11-18 LAB — LIPID PANEL
Cholesterol: 174 mg/dL (ref 0–200)
HDL: 57.4 mg/dL (ref >39.00)
LDL Cholesterol: 99 mg/dL (ref 0–99)
NONHDL: 117.02
Total CHOL/HDL Ratio: 3
Triglycerides: 91 mg/dL (ref 0.0–149.0)
VLDL: 18.2 mg/dL (ref 0.0–40.0)

## 2015-11-18 LAB — HEMOGLOBIN A1C: HEMOGLOBIN A1C: 6 % (ref 4.6–6.5)

## 2015-11-18 LAB — HEPATIC FUNCTION PANEL
ALK PHOS: 47 U/L (ref 39–117)
ALT: 20 U/L (ref 0–35)
AST: 20 U/L (ref 0–37)
Albumin: 4.2 g/dL (ref 3.5–5.2)
BILIRUBIN DIRECT: 0.2 mg/dL (ref 0.0–0.3)
BILIRUBIN TOTAL: 0.7 mg/dL (ref 0.2–1.2)
Total Protein: 6.3 g/dL (ref 6.0–8.3)

## 2015-11-18 LAB — BASIC METABOLIC PANEL
BUN: 18 mg/dL (ref 6–23)
CHLORIDE: 105 meq/L (ref 96–112)
CO2: 33 mEq/L — ABNORMAL HIGH (ref 19–32)
CREATININE: 0.67 mg/dL (ref 0.40–1.20)
Calcium: 9.4 mg/dL (ref 8.4–10.5)
GFR: 93.59 mL/min (ref >60.00)
Glucose, Bld: 126 mg/dL — ABNORMAL HIGH (ref 70–99)
Potassium: 5 mEq/L (ref 3.5–5.1)
Sodium: 141 mEq/L (ref 135–145)

## 2015-11-20 ENCOUNTER — Encounter: Payer: Self-pay | Admitting: Internal Medicine

## 2015-11-22 ENCOUNTER — Encounter: Payer: Self-pay | Admitting: Internal Medicine

## 2015-11-22 ENCOUNTER — Ambulatory Visit (INDEPENDENT_AMBULATORY_CARE_PROVIDER_SITE_OTHER): Payer: PPO | Admitting: Internal Medicine

## 2015-11-22 VITALS — BP 110/70 | HR 71 | Temp 98.1°F | Resp 18 | Ht 64.75 in | Wt 184.2 lb

## 2015-11-22 DIAGNOSIS — E669 Obesity, unspecified: Secondary | ICD-10-CM | POA: Diagnosis not present

## 2015-11-22 DIAGNOSIS — E7800 Pure hypercholesterolemia, unspecified: Secondary | ICD-10-CM | POA: Diagnosis not present

## 2015-11-22 DIAGNOSIS — I639 Cerebral infarction, unspecified: Secondary | ICD-10-CM | POA: Diagnosis not present

## 2015-11-22 DIAGNOSIS — D696 Thrombocytopenia, unspecified: Secondary | ICD-10-CM | POA: Diagnosis not present

## 2015-11-22 DIAGNOSIS — R739 Hyperglycemia, unspecified: Secondary | ICD-10-CM

## 2015-11-22 NOTE — Progress Notes (Signed)
[name], female   DOB: December 20, 1950, 65 y.o.   MRN: [id number]   Subjective:    [name], female    DOB: 1951-02-12, 65 y.o.   MRN: [id number]  HPI  Patient here for a scheduled follow up.  Previous presumed CVA.  Sees Dr Manuella Ghazi.  Just evaluated 11/02/15.  See his note.  Stable.  No changes made.  Had colonoscopy 04/05/15.  Serrated adenoma.  Recommended f/u colonoscopy in 2 years.  States she is doing well.  Feels good.  Tries to stay active.  Recently saw her eye doctor and her dermatologist.  Discussed lab results.     Past Medical History  Diagnosis Date  . Thrombocytopenia ([place])   . Stroke (cerebrum) ([place])   . Uterus descensus   . Hypercholesterolemia    Past Surgical History  Procedure Laterality Date  . Cholecystectomy  2007  . Tonsillectomy  1960  . Abdominal hysterectomy  2009  . Foot surgery  2007  . Dilation and curettage of uterus  2001 and 2006  . Vocal cord cyst removed    . Colonoscopy N/A 04/05/2015    Procedure: COLONOSCOPY;  Surgeon: Lollie Sails, MD;  Location: [facility] ENDOSCOPY;  Service: Endoscopy;  Laterality: N/A;   Family History  Problem Relation Age of Onset  . Diabetes    . Colon cancer Mother   . Stroke Paternal Grandmother   . Stroke Paternal Grandfather   . Prostate cancer Father   . Breast cancer Paternal Aunt     x2  . Uterine cancer Maternal Aunt    Social History   Social History  . Marital Status: Married    Spouse Name: N/A  . Number of Children: N/A  . Years of Education: N/A   Social History Main Topics  . Smoking status: Never Smoker   . Smokeless tobacco: Never Used  . Alcohol Use: No  . Drug Use: No  . Sexual Activity: Not Asked   Other Topics Concern  . None   Social History Narrative    Outpatient Encounter Prescriptions as of 11/22/2015  Medication Sig  . aspirin 81 MG tablet Take 81 mg by mouth daily.  . Cholecalciferol (VITAMIN D-1000 MAX ST) 1000 units tablet Take  by mouth.  . colestipol (COLESTID) 1 G tablet Take 1 g by mouth as needed.  . Multiple Vitamin (MULTI-VITAMINS) TABS Take by mouth.  . omega-3 acid ethyl esters (LOVAZA) 1 g capsule Take by mouth.  Marland Kitchen OVER THE COUNTER MEDICATION Vitamin pack  . Saccharomyces boulardii (FLORASTOR PO) Take by mouth daily as needed.  . [DISCONTINUED] timolol (BETIMOL) 0.5 % ophthalmic solution Place 1 drop into both eyes 2 (two) times daily.   No facility-administered encounter medications on file as of 11/22/2015.    Review of Systems  Constitutional: Negative for appetite change and unexpected weight change.  HENT: Negative for congestion and sinus pressure.   Respiratory: Negative for cough, chest tightness and shortness of breath.   Cardiovascular: Negative for chest pain, palpitations and leg swelling.  Gastrointestinal: Negative for nausea, vomiting, abdominal pain and diarrhea.  Genitourinary: Negative for dysuria and difficulty urinating.  Musculoskeletal: Negative for back pain and joint swelling.  Skin: Negative for color change and rash.  Neurological: Negative for dizziness, light-headedness and headaches.  Psychiatric/Behavioral: Negative for dysphoric mood and agitation.       Objective:    Physical Exam  Constitutional: She appears well-developed and well-nourished. No distress.  HENT:  Nose: Nose normal.  Mouth/Throat: Oropharynx is clear and moist.  Neck: Neck supple. No thyromegaly present.  Cardiovascular: Normal rate and regular rhythm.   Pulmonary/Chest: Breath sounds normal. No respiratory distress. She has no wheezes.  Abdominal: Soft. Bowel sounds are normal. There is no tenderness.  Musculoskeletal: She exhibits no edema or tenderness.  Lymphadenopathy:    She has no cervical adenopathy.  Skin: No rash noted. No erythema.  Psychiatric: She has a normal mood and affect. Her behavior is normal.    BP 110/70 mmHg  Pulse 71  Temp(Src) 98.1 F (36.7 C) (Oral)  Resp 18   Ht 5' 4.75" (1.645 m)  Wt 184 lb 4 oz (83.575 kg)  BMI 30.88 kg/m2  SpO2 95% Wt Readings from Last 3 Encounters:  11/22/15 184 lb 4 oz (83.575 kg)  07/19/15 179 lb 8 oz (81.421 kg)  04/05/15 175 lb (79.379 kg)     Lab Results  Component Value Date   WBC 4.9 07/13/2015   HGB 14.4 07/13/2015   HCT 42.3 07/13/2015   PLT 162.0 07/13/2015   GLUCOSE 126* 11/18/2015   CHOL 174 11/18/2015   TRIG 91.0 11/18/2015   HDL 57.40 11/18/2015   LDLCALC 99 11/18/2015   ALT 20 11/18/2015   AST 20 11/18/2015   NA 141 11/18/2015   K 5.0 11/18/2015   CL 105 11/18/2015   CREATININE 0.67 11/18/2015   BUN 18 11/18/2015   CO2 33* 11/18/2015   TSH 1.16 07/13/2015   HGBA1C 6.0 11/18/2015    Mm Digital Screening Bilateral  07/29/2015  CLINICAL DATA:  Screening. EXAM: DIGITAL SCREENING BILATERAL MAMMOGRAM WITH CAD COMPARISON:  Previous exam(s). ACR Breast Density Category b: There are scattered areas of fibroglandular density. FINDINGS: There are no findings suspicious for malignancy. Images were processed with CAD. IMPRESSION: No mammographic evidence of malignancy. A result letter of this screening mammogram will be mailed directly to the patient. RECOMMENDATION: Screening mammogram in one year. (Code:SM-B-01Y) BI-RADS CATEGORY  1: Negative. Electronically Signed   By: Margarette Canada M.D.   On: 07/29/2015 11:19       Assessment & Plan:   Problem List Items Addressed This Visit    CVA (cerebral vascular accident) ([place])    Followed by Dr Manuella Ghazi.  Has had no reoccurrence.  Continue daily aspirin.        Relevant Medications   omega-3 acid ethyl esters (LOVAZA) 1 g capsule   Hypercholesteremia    Low cholesterol diet and exercise.  Follow lipid panel.  LDL just checked 99.      Relevant Medications   omega-3 acid ethyl esters (LOVAZA) 1 g capsule   Other Relevant Orders   Lipid panel   Hepatic function panel   Hyperglycemia    Low carb diet and exercise.  Follow met b and a1c.  a1c just checked  6.0.        Relevant Orders   Hemoglobin U8Q   Basic metabolic panel   Obesity (BMI 30-39.9)    Diet and exercise.  Follow.        Thrombocytopenia ([place]) - Primary    Ultrasound revealed no splenomegaly.  Follow cbc.        Relevant Orders   CBC with Differential/Platelet       Einar Pheasant, MD

## 2015-11-22 NOTE — Progress Notes (Signed)
Pre-visit discussion using our clinic review tool. No additional management support is needed unless otherwise documented below in the visit note.  

## 2015-11-22 NOTE — Telephone Encounter (Signed)
Unread mychart message mailed to patient 

## 2015-11-27 ENCOUNTER — Encounter: Payer: Self-pay | Admitting: Internal Medicine

## 2015-11-27 DIAGNOSIS — E1159 Type 2 diabetes mellitus with other circulatory complications: Secondary | ICD-10-CM | POA: Insufficient documentation

## 2015-11-27 DIAGNOSIS — E119 Type 2 diabetes mellitus without complications: Secondary | ICD-10-CM

## 2015-11-27 NOTE — Assessment & Plan Note (Signed)
Low carb diet and exercise.  Follow met b and a1c.  a1c just checked 6.0.

## 2015-11-27 NOTE — Assessment & Plan Note (Signed)
Ultrasound revealed no splenomegaly.  Follow cbc.

## 2015-11-27 NOTE — Assessment & Plan Note (Addendum)
Low cholesterol diet and exercise.  Follow lipid panel.  LDL just checked 99.

## 2015-11-27 NOTE — Assessment & Plan Note (Signed)
Diet and exercise.  Follow.  

## 2015-11-27 NOTE — Assessment & Plan Note (Signed)
Followed by Dr Manuella Ghazi.  Has had no reoccurrence.  Continue daily aspirin.

## 2015-12-19 DIAGNOSIS — M79674 Pain in right toe(s): Secondary | ICD-10-CM | POA: Diagnosis not present

## 2015-12-19 DIAGNOSIS — B351 Tinea unguium: Secondary | ICD-10-CM | POA: Diagnosis not present

## 2015-12-26 DIAGNOSIS — H1013 Acute atopic conjunctivitis, bilateral: Secondary | ICD-10-CM | POA: Diagnosis not present

## 2015-12-30 DIAGNOSIS — H1013 Acute atopic conjunctivitis, bilateral: Secondary | ICD-10-CM | POA: Diagnosis not present

## 2016-01-02 DIAGNOSIS — H1013 Acute atopic conjunctivitis, bilateral: Secondary | ICD-10-CM | POA: Diagnosis not present

## 2016-01-10 DIAGNOSIS — H401123 Primary open-angle glaucoma, left eye, severe stage: Secondary | ICD-10-CM | POA: Diagnosis not present

## 2016-02-14 DIAGNOSIS — H401123 Primary open-angle glaucoma, left eye, severe stage: Secondary | ICD-10-CM | POA: Diagnosis not present

## 2016-02-21 DIAGNOSIS — Z1283 Encounter for screening for malignant neoplasm of skin: Secondary | ICD-10-CM | POA: Diagnosis not present

## 2016-02-21 DIAGNOSIS — Z08 Encounter for follow-up examination after completed treatment for malignant neoplasm: Secondary | ICD-10-CM | POA: Diagnosis not present

## 2016-02-21 DIAGNOSIS — B078 Other viral warts: Secondary | ICD-10-CM | POA: Diagnosis not present

## 2016-02-21 DIAGNOSIS — D485 Neoplasm of uncertain behavior of skin: Secondary | ICD-10-CM | POA: Diagnosis not present

## 2016-02-21 DIAGNOSIS — D225 Melanocytic nevi of trunk: Secondary | ICD-10-CM | POA: Diagnosis not present

## 2016-02-21 DIAGNOSIS — L821 Other seborrheic keratosis: Secondary | ICD-10-CM | POA: Diagnosis not present

## 2016-02-21 DIAGNOSIS — L918 Other hypertrophic disorders of the skin: Secondary | ICD-10-CM | POA: Diagnosis not present

## 2016-02-21 DIAGNOSIS — Z85828 Personal history of other malignant neoplasm of skin: Secondary | ICD-10-CM | POA: Diagnosis not present

## 2016-03-14 IMAGING — MR MRI HEAD WITHOUT CONTRAST
10 series · 43 of 48 positions shown · non-contrast
Comparison: CT head 07/25/2014.

CLINICAL DATA: Weakness and numbness for the past 3 days. Initial
encounter.

EXAM:
MRI HEAD WITHOUT CONTRAST
TECHNIQUE: Multiplanar, multiecho pulse sequences of the brain and surrounding
structures were obtained without intravenous contrast.

[Series 2: T1 · sagittal · 5.0mm · 0.45mm/px · 3 of 25 slices shown (1 of 2)]
[im 1/25]
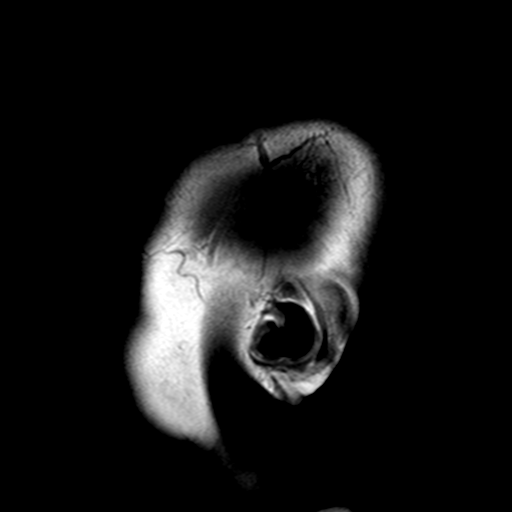
[im 13/25]
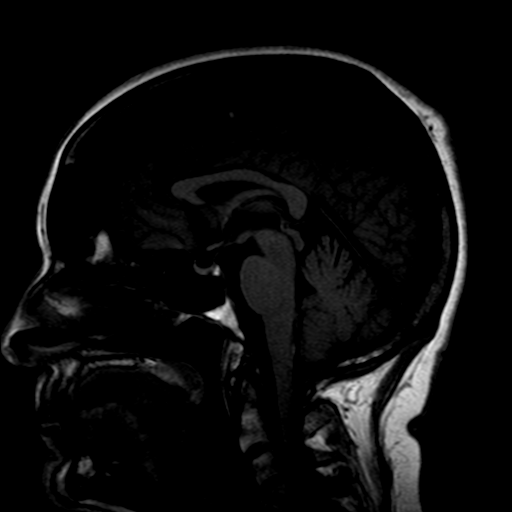
[im 25/25]
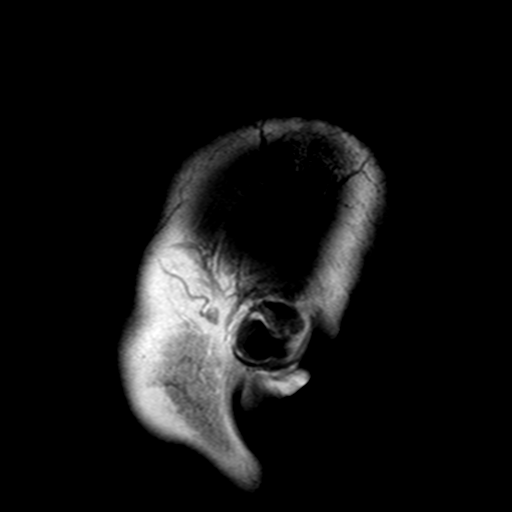

[Series 5: DWI · axial · 4.0mm · 1.80mm/px · z∈[-81,+75]mm · 5 of 40 slices shown (1 of 2)]
[im 1/40]
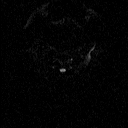
[im 10/40]
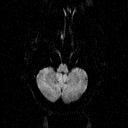
[im 20/40]
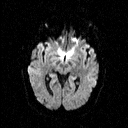
[im 30/40]
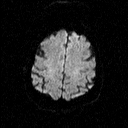
[im 40/40]
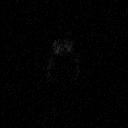

[Series 6: ADC · axial · 4.0mm · 1.80mm/px · z∈[-81,+74]mm · 6 of 40 slices shown (1 of 2)]
[im 1/40]
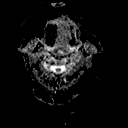
[im 8/40]
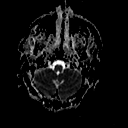
[im 16/40]
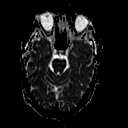
[im 24/40]
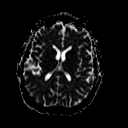
[im 32/40]
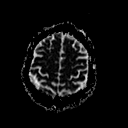
[im 40/40]
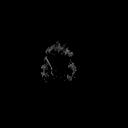

[Series 9: DWI · coronal · 5.0mm · 1.80mm/px · 5 of 33 slices shown (2 of 2)]
[im 1/33]
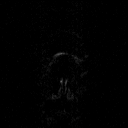
[im 9/33]
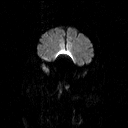
[im 17/33]
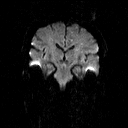
[im 25/33]
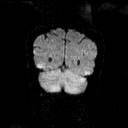
[im 33/33]
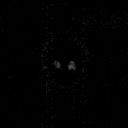

[Series 10: ADC · coronal · 5.0mm · 1.80mm/px · 5 of 35 slices shown (2 of 2)]
[im 1/35]
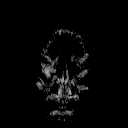
[im 9/35]
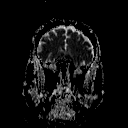
[im 18/35]
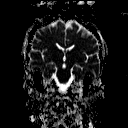
[im 26/35]
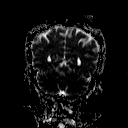
[im 35/35]
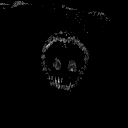

[Series 11: T2 · axial · 5.0mm · 0.45mm/px · z∈[-82,+74]mm · 4 of 25 slices shown (1 of 3)]
[im 1/25]
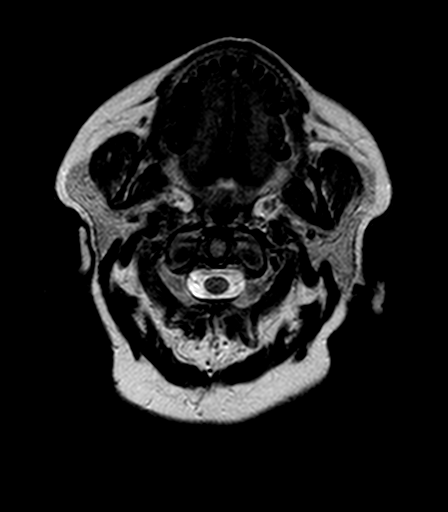
[im 9/25]
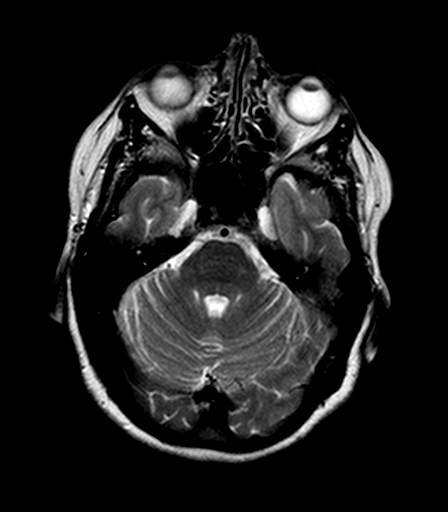
[im 17/25]
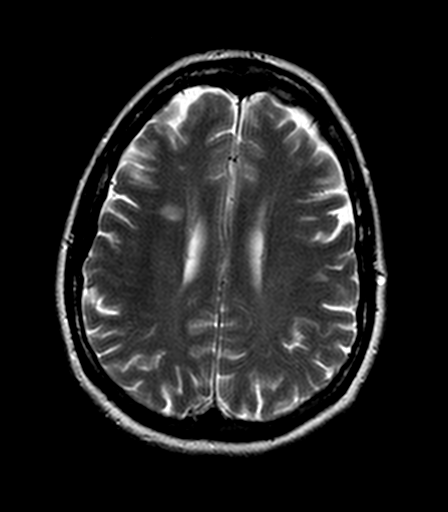
[im 25/25]
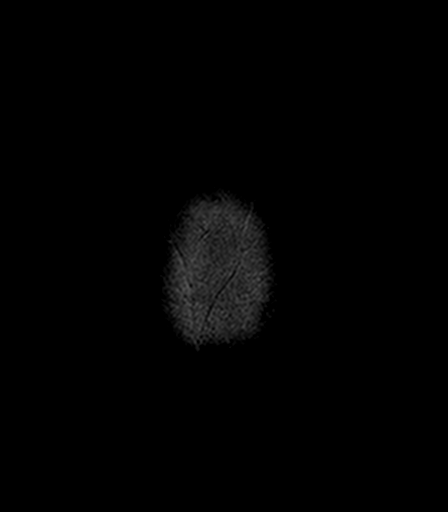

[Series 13: T2 · axial · 5.0mm · 0.45mm/px · z∈[-82,+74]mm · 4 of 25 slices shown (2 of 3)]
[im 1/25]
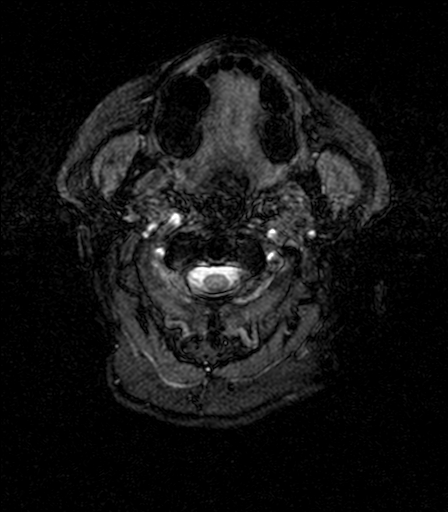
[im 9/25]
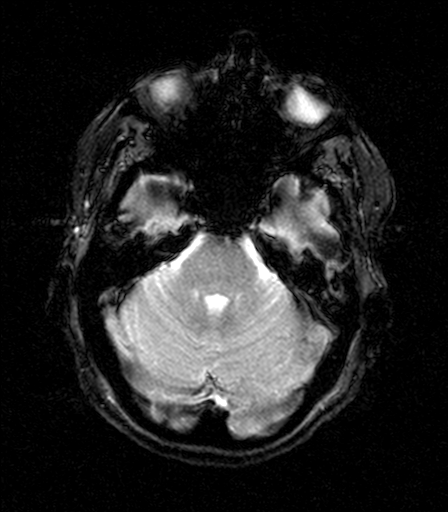
[im 17/25]
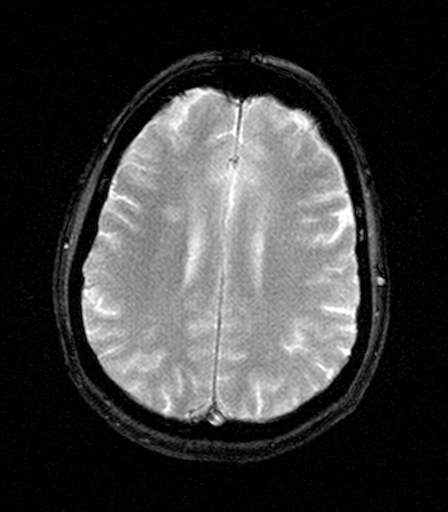
[im 25/25]
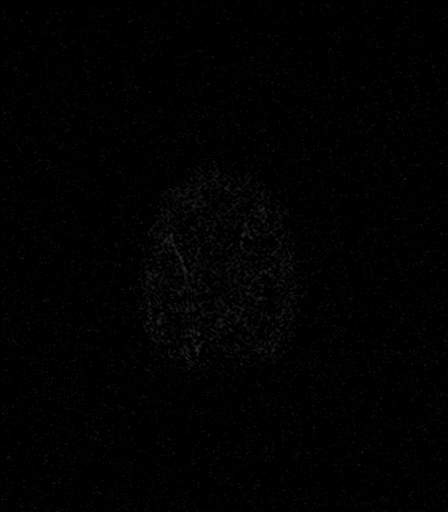

[Series 14: T1 · axial · 3.0mm · 0.45mm/px · z∈[-81,-39]mm · 3 of 52 slices shown (2 of 2)]
[im 1/52]
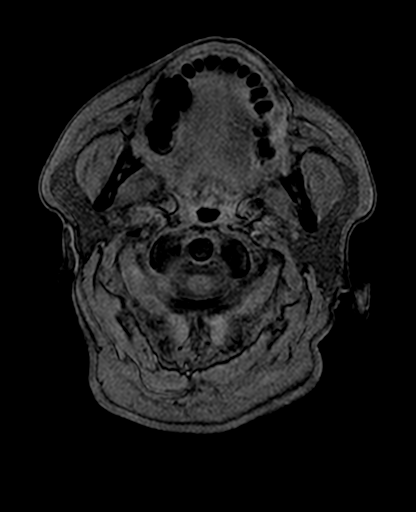
[im 8/52]
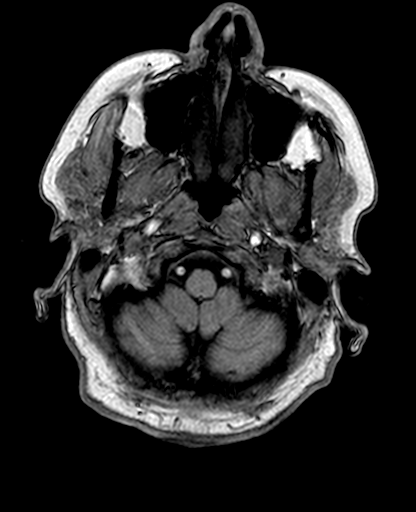
[im 15/52]
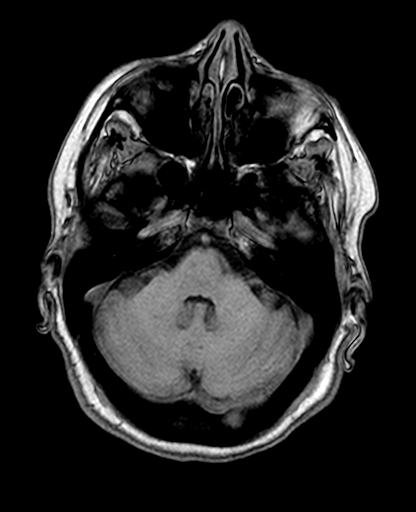

[Series 15: T2 · coronal · 5.0mm · 0.45mm/px · 4 of 27 slices shown (3 of 3)]
[im 1/27]
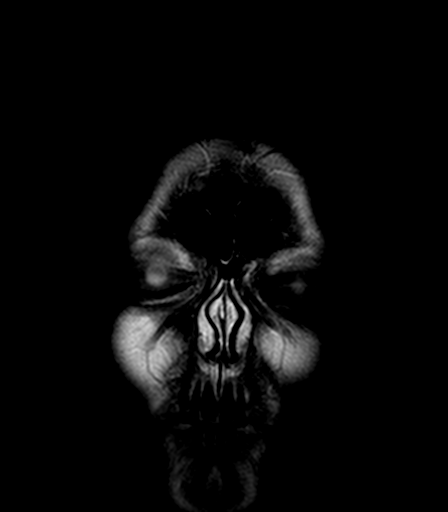
[im 9/27]
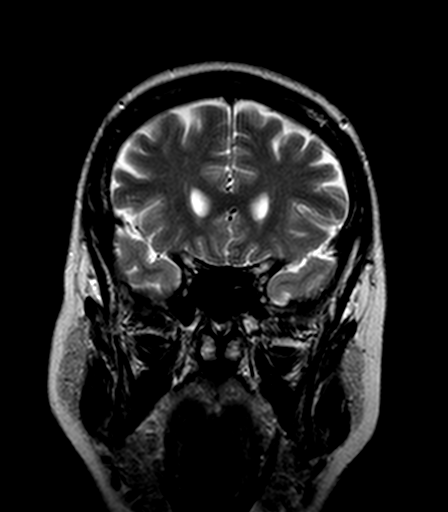
[im 18/27]
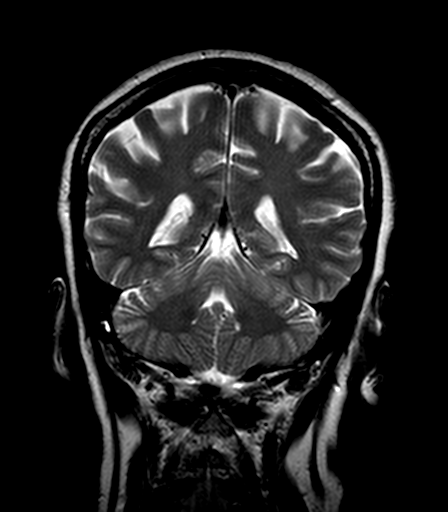
[im 27/27]
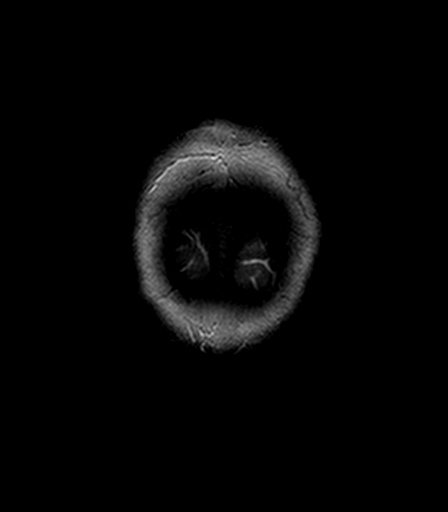

[Series 16: FLAIR · axial · 5.0mm · 0.90mm/px · z∈[-81,+75]mm · 4 of 25 slices shown]
[im 1/25]
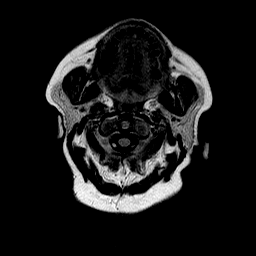
[im 9/25]
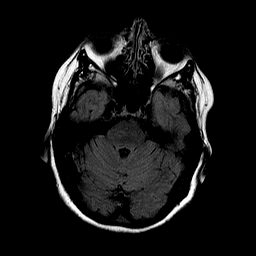
[im 17/25]
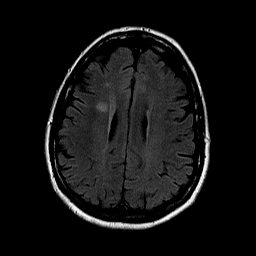
[im 25/25]
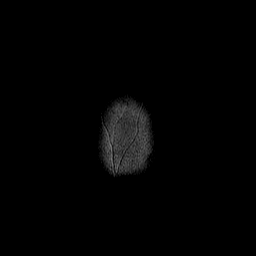

[43 of 48 positions shown; findings below may reference images not displayed]

FINDINGS: There is a RIGHT frontal subcortical white matter lesions showing
mild peripheral restricted diffusion, approximately 1 cm in diameter
(image 28 series 5, image 22 series 9). The lesion displays T2 and
FLAIR hyperintensity. No other similar lesions.

No hemorrhage, hydrocephalus, or extra-axial fluid.

Mild premature cerebral and cerebellar atrophy. Paucity of white
matter disease elsewhere, perhaps single subcentimeter RIGHT
anterior frontal white matter focus. No subarachnoid blood. No areas
of large vessel infarction.

Flow voids are maintained throughout the carotid, basilar, and
vertebral arteries. There are no areas of chronic hemorrhage.
Pituitary, pineal, and cerebellar tonsils unremarkable. No upper
cervical lesions. Visualized calvarium, skull base, and upper
cervical osseous structures unremarkable. Scalp and extracranial
soft tissues, orbits, sinuses, and mastoids show no acute process.

Compared with prior CT head, the lesion is visualized on CT as an
area of hypoattenuation without hemorrhage or surrounding edema.
IMPRESSION: RIGHT frontal subcortical white matter lesion showing mild
peripheral restricted diffusion approximately 1 cm in diameter. I
favor this representing a subacute infarct.

Mild atrophy with a paucity of white matter disease elsewhere.

Consider post infusion imaging for further evaluation if no
contraindications to gadolinium administration.

## 2016-03-19 ENCOUNTER — Other Ambulatory Visit (INDEPENDENT_AMBULATORY_CARE_PROVIDER_SITE_OTHER): Payer: PPO

## 2016-03-19 ENCOUNTER — Encounter: Payer: Self-pay | Admitting: Internal Medicine

## 2016-03-19 DIAGNOSIS — R739 Hyperglycemia, unspecified: Secondary | ICD-10-CM

## 2016-03-19 DIAGNOSIS — D696 Thrombocytopenia, unspecified: Secondary | ICD-10-CM | POA: Diagnosis not present

## 2016-03-19 DIAGNOSIS — E7800 Pure hypercholesterolemia, unspecified: Secondary | ICD-10-CM

## 2016-03-19 LAB — CBC WITH DIFFERENTIAL/PLATELET
BASOS ABS: 0 10*3/uL (ref 0.0–0.1)
BASOS PCT: 0.4 % (ref 0.0–3.0)
EOS ABS: 0.1 10*3/uL (ref 0.0–0.7)
Eosinophils Relative: 1.3 % (ref 0.0–5.0)
HCT: 42.5 % (ref 36.0–46.0)
HEMOGLOBIN: 14.8 g/dL (ref 12.0–15.0)
Lymphocytes Relative: 28.9 % (ref 12.0–46.0)
Lymphs Abs: 1.6 10*3/uL (ref 0.7–4.0)
MCHC: 34.8 g/dL (ref 30.0–36.0)
MCV: 92.3 fl (ref 78.0–100.0)
MONO ABS: 0.3 10*3/uL (ref 0.1–1.0)
Monocytes Relative: 5.3 % (ref 3.0–12.0)
Neutro Abs: 3.5 10*3/uL (ref 1.4–7.7)
Neutrophils Relative %: 64.1 % (ref 43.0–77.0)
Platelets: 165 10*3/uL (ref 150.0–400.0)
RBC: 4.61 Mil/uL (ref 3.87–5.11)
RDW: 13.5 % (ref 11.5–15.5)
WBC: 5.4 10*3/uL (ref 4.0–10.5)

## 2016-03-19 LAB — HEPATIC FUNCTION PANEL
ALBUMIN: 4.4 g/dL (ref 3.5–5.2)
ALK PHOS: 53 U/L (ref 39–117)
ALT: 35 U/L (ref 0–35)
AST: 31 U/L (ref 0–37)
Bilirubin, Direct: 0.2 mg/dL (ref 0.0–0.3)
TOTAL PROTEIN: 6.7 g/dL (ref 6.0–8.3)
Total Bilirubin: 0.9 mg/dL (ref 0.2–1.2)

## 2016-03-19 LAB — LIPID PANEL
Cholesterol: 199 mg/dL (ref 0–200)
HDL: 60.9 mg/dL (ref >39.00)
LDL Cholesterol: 111 mg/dL — ABNORMAL HIGH (ref 0–99)
NonHDL: 138.1
TRIGLYCERIDES: 137 mg/dL (ref 0.0–149.0)
Total CHOL/HDL Ratio: 3
VLDL: 27.4 mg/dL (ref 0.0–40.0)

## 2016-03-19 LAB — BASIC METABOLIC PANEL
BUN: 18 mg/dL (ref 6–23)
CALCIUM: 9.7 mg/dL (ref 8.4–10.5)
CO2: 30 mEq/L (ref 19–32)
CREATININE: 0.72 mg/dL (ref 0.40–1.20)
Chloride: 106 mEq/L (ref 96–112)
GFR: 86.05 mL/min (ref >60.00)
Glucose, Bld: 124 mg/dL — ABNORMAL HIGH (ref 70–99)
Potassium: 4.2 mEq/L (ref 3.5–5.1)
Sodium: 142 mEq/L (ref 135–145)

## 2016-03-19 LAB — HEMOGLOBIN A1C: Hgb A1c MFr Bld: 6.4 % (ref 4.6–6.5)

## 2016-03-20 DIAGNOSIS — H401123 Primary open-angle glaucoma, left eye, severe stage: Secondary | ICD-10-CM | POA: Diagnosis not present

## 2016-03-23 ENCOUNTER — Encounter: Payer: Self-pay | Admitting: Internal Medicine

## 2016-03-23 ENCOUNTER — Ambulatory Visit (INDEPENDENT_AMBULATORY_CARE_PROVIDER_SITE_OTHER): Payer: PPO | Admitting: Internal Medicine

## 2016-03-23 DIAGNOSIS — R739 Hyperglycemia, unspecified: Secondary | ICD-10-CM | POA: Diagnosis not present

## 2016-03-23 DIAGNOSIS — E669 Obesity, unspecified: Secondary | ICD-10-CM

## 2016-03-23 DIAGNOSIS — F439 Reaction to severe stress, unspecified: Secondary | ICD-10-CM | POA: Diagnosis not present

## 2016-03-23 DIAGNOSIS — Z23 Encounter for immunization: Secondary | ICD-10-CM | POA: Diagnosis not present

## 2016-03-23 DIAGNOSIS — D696 Thrombocytopenia, unspecified: Secondary | ICD-10-CM | POA: Diagnosis not present

## 2016-03-23 DIAGNOSIS — I639 Cerebral infarction, unspecified: Secondary | ICD-10-CM

## 2016-03-23 DIAGNOSIS — H409 Unspecified glaucoma: Secondary | ICD-10-CM

## 2016-03-23 DIAGNOSIS — E7800 Pure hypercholesterolemia, unspecified: Secondary | ICD-10-CM

## 2016-03-23 NOTE — Progress Notes (Signed)
Pre visit review using our clinic review tool, if applicable. No additional management support is needed unless otherwise documented below in the visit note. 

## 2016-03-23 NOTE — Progress Notes (Signed)
[name], female   DOB: [birth year], 65 y.o.   MRN: [id number]   Subjective:    [name], female    DOB: [birth year], 65 y.o.   MRN: [id number]  HPI  Patient here for a scheduled follow up.  She reports she is doing well.  Feels good.  Stays active.  Just had mole removed from her breast.  Everything checked out ok.  Recommended f/u in one year.  She has glaucoma.  Followed by opthalmology.  Using alphagan.  Is s/p laser treatment.  Has f/u next week.  Sees Dr Dorthula Nettles.  No chest pain.  No sob.  No abdominal pain or cramping.  Bowels stable.  Discussed her lab results.  LDL increased some - 111.  a1c 6.4.  We discussed diet and exercise.     Past Medical History:  Diagnosis Date  . Hypercholesterolemia   . Stroke (cerebrum) ([place])   . Thrombocytopenia ([place])   . Uterus descensus    Past Surgical History:  Procedure Laterality Date  . ABDOMINAL HYSTERECTOMY  2009  . CHOLECYSTECTOMY  2007  . COLONOSCOPY N/A 04/05/2015   Procedure: COLONOSCOPY;  Surgeon: Lollie Sails, MD;  Location: [facility] ENDOSCOPY;  Service: Endoscopy;  Laterality: N/A;  . DILATION AND CURETTAGE OF UTERUS  2001 and 2006  . FOOT SURGERY  2007  . TONSILLECTOMY  1960  . vocal cord cyst removed     Family History  Problem Relation Age of Onset  . Diabetes    . Colon cancer Mother   . Stroke Paternal Grandmother   . Stroke Paternal Grandfather   . Prostate cancer Father   . Breast cancer Paternal Aunt     x2  . Uterine cancer Maternal Aunt    Social History   Social History  . Marital status: Married    Spouse name: N/A  . Number of children: N/A  . Years of education: N/A   Social History Main Topics  . Smoking status: Never Smoker  . Smokeless tobacco: Never Used  . Alcohol use No  . Drug use: No  . Sexual activity: Not Asked   Other Topics Concern  . None   Social History Narrative  . None    Outpatient Encounter Prescriptions as of 03/23/2016    Medication Sig  . aspirin 81 MG tablet Take 81 mg by mouth daily.  . Cholecalciferol (VITAMIN D-1000 MAX ST) 1000 units tablet Take by mouth.  . colestipol (COLESTID) 1 G tablet Take 1 g by mouth as needed.  . Multiple Vitamin (MULTI-VITAMINS) TABS Take by mouth.  . omega-3 acid ethyl esters (LOVAZA) 1 g capsule Take by mouth.  Marland Kitchen OVER THE COUNTER MEDICATION Vitamin pack  . Saccharomyces boulardii (FLORASTOR PO) Take by mouth daily as needed.   No facility-administered encounter medications on file as of 03/23/2016.     Review of Systems  Constitutional: Negative for appetite change and unexpected weight change.  HENT: Negative for congestion and sinus pressure.   Respiratory: Negative for cough, chest tightness and shortness of breath.   Cardiovascular: Negative for chest pain and palpitations.  Gastrointestinal: Negative for abdominal pain, diarrhea, nausea and vomiting.  Genitourinary: Negative for difficulty urinating and dysuria.  Musculoskeletal: Negative for back pain and joint swelling.  Skin: Negative for color change and rash.  Neurological: Negative for dizziness, light-headedness and headaches.  Psychiatric/Behavioral: Negative for agitation and dysphoric mood.       Objective:    Physical Exam  Constitutional: She appears well-developed and well-nourished. No distress.  HENT:  Nose: Nose normal.  Mouth/Throat: Oropharynx is clear and moist.  Neck: Neck supple. No thyromegaly present.  Cardiovascular: Normal rate and regular rhythm.   Pulmonary/Chest: Breath sounds normal. No respiratory distress. She has no wheezes.  Abdominal: Soft. Bowel sounds are normal. There is no tenderness.  Musculoskeletal: She exhibits no edema or tenderness.  Lymphadenopathy:    She has no cervical adenopathy.  Skin: No rash noted. No erythema.  Psychiatric: She has a normal mood and affect. Her behavior is normal.    BP 118/80   Pulse 66   Temp 98.6 F (37 C) (Oral)   Ht _0   (1.651 m)   Wt 186 lb 6.4 oz (84.6 kg)   SpO2 95%   BMI 31.02 kg/m  Wt Readings from Last 3 Encounters:  03/23/16 186 lb 6.4 oz (84.6 kg)  11/22/15 184 lb 4 oz (83.6 kg)  07/19/15 179 lb 8 oz (81.4 kg)     Lab Results  Component Value Date   WBC 5.4 03/19/2016   HGB 14.8 03/19/2016   HCT 42.5 03/19/2016   PLT 165.0 03/19/2016   GLUCOSE 124 (H) 03/19/2016   CHOL 199 03/19/2016   TRIG 137.0 03/19/2016   HDL 60.90 03/19/2016   LDLCALC 111 (H) 03/19/2016   ALT 35 03/19/2016   AST 31 03/19/2016   NA 142 03/19/2016   K 4.2 03/19/2016   CL 106 03/19/2016   CREATININE 0.72 03/19/2016   BUN 18 03/19/2016   CO2 30 03/19/2016   TSH 1.16 07/13/2015   HGBA1C 6.4 03/19/2016    Mm Digital Screening Bilateral  Result Date: 07/29/2015 CLINICAL DATA:  Screening. EXAM: DIGITAL SCREENING BILATERAL MAMMOGRAM WITH CAD COMPARISON:  Previous exam(s). ACR Breast Density Category b: There are scattered areas of fibroglandular density. FINDINGS: There are no findings suspicious for malignancy. Images were processed with CAD. IMPRESSION: No mammographic evidence of malignancy. A result letter of this screening mammogram will be mailed directly to the patient. RECOMMENDATION: Screening mammogram in one year. (Code:SM-B-01Y) BI-RADS CATEGORY  1: Negative. Electronically Signed   By: Margarette Canada M.D.   On: 07/29/2015 11:19       Assessment & Plan:   Problem List Items Addressed This Visit    CVA (cerebral vascular accident) ([place])    Followed by Dr Manuella Ghazi.  No reoccurrence.  Continue aspirin daily.        Glaucoma    Followed by Dr Dorthula Nettles.        Hypercholesteremia    Low cholesterol diet and exercise.  LDL on recent check 111.  Has been in 94s.  Discussed diet and exercise.  Discussed medication.  Will follow.  If persistent elevation, will add statin medication given history.        Relevant Orders   Hepatic function panel   Lipid panel   TSH   Hyperglycemia    Low carb diet and exercise.   Follow met b and a1c.       Relevant Orders   Hemoglobin C7E   Basic metabolic panel   Obesity (BMI 30-39.9)    Diet and exercise.  Follow.       Stress    Some stress related to her eye issues.  Doing well.  Follow.        Thrombocytopenia ([facility])    Last platelet count wnl.  Follow.        Other Visit Diagnoses    Encounter  for immunization       Relevant Orders   Flu vaccine HIGH DOSE PF (Completed)       Einar Pheasant, MD

## 2016-03-25 ENCOUNTER — Encounter: Payer: Self-pay | Admitting: Internal Medicine

## 2016-03-25 DIAGNOSIS — H409 Unspecified glaucoma: Secondary | ICD-10-CM | POA: Insufficient documentation

## 2016-03-25 NOTE — Assessment & Plan Note (Signed)
Some stress related to her eye issues.  Doing well.  Follow.

## 2016-03-25 NOTE — Assessment & Plan Note (Signed)
Diet and exercise.  Follow.  

## 2016-03-25 NOTE — Assessment & Plan Note (Signed)
Followed by Dr Dorthula Nettles.

## 2016-03-25 NOTE — Assessment & Plan Note (Signed)
Low cholesterol diet and exercise.  LDL on recent check 111.  Has been in 22s.  Discussed diet and exercise.  Discussed medication.  Will follow.  If persistent elevation, will add statin medication given history.

## 2016-03-25 NOTE — Assessment & Plan Note (Signed)
Low carb diet and exercise.  Follow met b and a1c.  

## 2016-03-25 NOTE — Assessment & Plan Note (Signed)
Last platelet count wnl.  Follow.  

## 2016-03-25 NOTE — Assessment & Plan Note (Signed)
Followed by Dr Manuella Ghazi.  No reoccurrence.  Continue aspirin daily.

## 2016-03-28 DIAGNOSIS — H401123 Primary open-angle glaucoma, left eye, severe stage: Secondary | ICD-10-CM | POA: Diagnosis not present

## 2016-05-09 DIAGNOSIS — H401123 Primary open-angle glaucoma, left eye, severe stage: Secondary | ICD-10-CM | POA: Diagnosis not present

## 2016-06-19 DIAGNOSIS — Z86010 Personal history of colonic polyps: Secondary | ICD-10-CM | POA: Diagnosis not present

## 2016-06-19 DIAGNOSIS — Z862 Personal history of diseases of the blood and blood-forming organs and certain disorders involving the immune mechanism: Secondary | ICD-10-CM | POA: Diagnosis not present

## 2016-06-25 ENCOUNTER — Other Ambulatory Visit: Payer: Self-pay | Admitting: Internal Medicine

## 2016-06-25 DIAGNOSIS — Z1231 Encounter for screening mammogram for malignant neoplasm of breast: Secondary | ICD-10-CM

## 2016-07-11 DIAGNOSIS — H401123 Primary open-angle glaucoma, left eye, severe stage: Secondary | ICD-10-CM | POA: Diagnosis not present

## 2016-07-23 ENCOUNTER — Other Ambulatory Visit (INDEPENDENT_AMBULATORY_CARE_PROVIDER_SITE_OTHER): Payer: PPO

## 2016-07-23 DIAGNOSIS — R739 Hyperglycemia, unspecified: Secondary | ICD-10-CM | POA: Diagnosis not present

## 2016-07-23 DIAGNOSIS — E7800 Pure hypercholesterolemia, unspecified: Secondary | ICD-10-CM | POA: Diagnosis not present

## 2016-07-23 LAB — HEPATIC FUNCTION PANEL
ALT: 28 U/L (ref 0–35)
AST: 29 U/L (ref 0–37)
Albumin: 4.3 g/dL (ref 3.5–5.2)
Alkaline Phosphatase: 54 U/L (ref 39–117)
BILIRUBIN DIRECT: 0.1 mg/dL (ref 0.0–0.3)
BILIRUBIN TOTAL: 0.8 mg/dL (ref 0.2–1.2)
TOTAL PROTEIN: 6.5 g/dL (ref 6.0–8.3)

## 2016-07-23 LAB — LIPID PANEL
CHOL/HDL RATIO: 3
Cholesterol: 211 mg/dL — ABNORMAL HIGH (ref 0–200)
HDL: 61.8 mg/dL (ref >39.00)
LDL CALC: 125 mg/dL — AB (ref 0–99)
NonHDL: 149.65
TRIGLYCERIDES: 123 mg/dL (ref 0.0–149.0)
VLDL: 24.6 mg/dL (ref 0.0–40.0)

## 2016-07-23 LAB — HEMOGLOBIN A1C: Hgb A1c MFr Bld: 6.4 % (ref 4.6–6.5)

## 2016-07-23 LAB — BASIC METABOLIC PANEL
BUN: 15 mg/dL (ref 6–23)
CHLORIDE: 107 meq/L (ref 96–112)
CO2: 27 mEq/L (ref 19–32)
Calcium: 9.5 mg/dL (ref 8.4–10.5)
Creatinine, Ser: 0.72 mg/dL (ref 0.40–1.20)
GFR: 85.95 mL/min (ref >60.00)
Glucose, Bld: 128 mg/dL — ABNORMAL HIGH (ref 70–99)
POTASSIUM: 4.6 meq/L (ref 3.5–5.1)
SODIUM: 142 meq/L (ref 135–145)

## 2016-07-23 LAB — TSH: TSH: 1.61 u[IU]/mL (ref 0.35–4.50)

## 2016-07-24 ENCOUNTER — Encounter: Payer: Self-pay | Admitting: Internal Medicine

## 2016-07-24 ENCOUNTER — Ambulatory Visit (INDEPENDENT_AMBULATORY_CARE_PROVIDER_SITE_OTHER): Payer: PPO | Admitting: Internal Medicine

## 2016-07-24 VITALS — BP 120/78 | HR 64 | Temp 98.6°F | Resp 16 | Ht 65.0 in | Wt 181.4 lb

## 2016-07-24 DIAGNOSIS — H409 Unspecified glaucoma: Secondary | ICD-10-CM

## 2016-07-24 DIAGNOSIS — D696 Thrombocytopenia, unspecified: Secondary | ICD-10-CM

## 2016-07-24 DIAGNOSIS — Z8673 Personal history of transient ischemic attack (TIA), and cerebral infarction without residual deficits: Secondary | ICD-10-CM

## 2016-07-24 DIAGNOSIS — E7800 Pure hypercholesterolemia, unspecified: Secondary | ICD-10-CM | POA: Diagnosis not present

## 2016-07-24 DIAGNOSIS — M25512 Pain in left shoulder: Secondary | ICD-10-CM | POA: Diagnosis not present

## 2016-07-24 DIAGNOSIS — M722 Plantar fascial fibromatosis: Secondary | ICD-10-CM | POA: Diagnosis not present

## 2016-07-24 DIAGNOSIS — Z0000 Encounter for general adult medical examination without abnormal findings: Secondary | ICD-10-CM

## 2016-07-24 DIAGNOSIS — R739 Hyperglycemia, unspecified: Secondary | ICD-10-CM | POA: Diagnosis not present

## 2016-07-24 NOTE — Progress Notes (Signed)
[name], female   DOB: 04-22-1951, 66 y.o.   MRN: [id number]   Subjective:    [name], female    DOB: 1950-09-04, 66 y.o.   MRN: [id number]  HPI  Patient with past history of hypercholesterolemia, CVA and thrombocytopenia.  She comes in today to follow up on these issues as well as for a complete physical exam.  She reports she is doing relatively well.  She tries to stay active.  No chest pain.  No sob.  No acid reflux.  No abdominal pain or cramping.  Bowels stable.  Having issues with her left shoulder.  Notices when reaches posteriorly.  Seeing a chiropractor for her feet.    Past Medical History:  Diagnosis Date  . Hypercholesterolemia   . Stroke (cerebrum) ([place])   . Thrombocytopenia ([place])   . Uterus descensus    Past Surgical History:  Procedure Laterality Date  . ABDOMINAL HYSTERECTOMY  2009  . CHOLECYSTECTOMY  2007  . COLONOSCOPY N/A 04/05/2015   Procedure: COLONOSCOPY;  Surgeon: Lollie Sails, MD;  Location: [facility] ENDOSCOPY;  Service: Endoscopy;  Laterality: N/A;  . DILATION AND CURETTAGE OF UTERUS  2001 and 2006  . FOOT SURGERY  2007  . TONSILLECTOMY  1960  . vocal cord cyst removed     Family History  Problem Relation Age of Onset  . Diabetes    . Colon cancer Mother   . Stroke Paternal Grandmother   . Stroke Paternal Grandfather   . Prostate cancer Father   . Breast cancer Paternal Aunt     x2  . Uterine cancer Maternal Aunt    Social History   Social History  . Marital status: Married    Spouse name: N/A  . Number of children: N/A  . Years of education: N/A   Social History Main Topics  . Smoking status: Never Smoker  . Smokeless tobacco: Never Used  . Alcohol use No  . Drug use: No  . Sexual activity: Not Asked   Other Topics Concern  . None   Social History Narrative  . None    Outpatient Encounter Prescriptions as of 07/24/2016  Medication Sig  . aspirin 81 MG tablet Take 81 mg by mouth  daily.  . Cholecalciferol (VITAMIN D-1000 MAX ST) 1000 units tablet Take by mouth.  . colestipol (COLESTID) 1 G tablet Take 1 g by mouth as needed.  . Multiple Vitamin (MULTI-VITAMINS) TABS Take by mouth.  . omega-3 acid ethyl esters (LOVAZA) 1 g capsule Take by mouth.  Marland Kitchen OVER THE COUNTER MEDICATION Vitamin pack  . Saccharomyces boulardii (FLORASTOR PO) Take by mouth daily as needed.  . Timolol Maleate (ISTALOL) 0.5 % (DAILY) SOLN Apply to eye.  . brimonidine-timolol (COMBIGAN) 0.2-0.5 % ophthalmic solution Place 1 drop into both eyes 2 (two) times daily.  . travoprost, benzalkonium, (TRAVATAN) 0.004 % ophthalmic solution Place 1 drop into the left eye daily.   No facility-administered encounter medications on file as of 07/24/2016.     Review of Systems  Constitutional: Negative for appetite change and unexpected weight change.  HENT: Negative for congestion and sinus pressure.   Eyes: Negative for pain and visual disturbance.  Respiratory: Negative for cough, chest tightness and shortness of breath.   Cardiovascular: Negative for chest pain, palpitations and leg swelling.  Gastrointestinal: Negative for abdominal pain, diarrhea, nausea and vomiting.  Genitourinary: Negative for difficulty urinating and dysuria.  Musculoskeletal: Negative for back pain and joint swelling.  Left shoulder pain as outlined.    Skin: Negative for color change and rash.  Neurological: Negative for dizziness, light-headedness and headaches.  Hematological: Negative for adenopathy. Does not bruise/bleed easily.  Psychiatric/Behavioral: Negative for agitation and dysphoric mood.       Objective:    Physical Exam  Constitutional: She is oriented to person, place, and time. She appears well-developed and well-nourished. No distress.  HENT:  Nose: Nose normal.  Mouth/Throat: Oropharynx is clear and moist.  Eyes: Right eye exhibits no discharge. Left eye exhibits no discharge. No scleral icterus.    Neck: Neck supple. No thyromegaly present.  Cardiovascular: Normal rate and regular rhythm.   Pulmonary/Chest: Breath sounds normal. No accessory muscle usage. No tachypnea. No respiratory distress. She has no decreased breath sounds. She has no wheezes. She has no rhonchi. Right breast exhibits no inverted nipple, no mass, no nipple discharge and no tenderness (no axillary adenopathy). Left breast exhibits no inverted nipple, no mass, no nipple discharge and no tenderness (no axilarry adenopathy).  Abdominal: Soft. Bowel sounds are normal. There is no tenderness.  Musculoskeletal: She exhibits no edema or tenderness.  Lymphadenopathy:    She has no cervical adenopathy.  Neurological: She is alert and oriented to person, place, and time.  Skin: Skin is warm. No rash noted. No erythema.  Psychiatric: She has a normal mood and affect. Her behavior is normal.    BP 120/78 (BP Location: Left Arm, Patient Position: Sitting, Cuff Size: Large)   Pulse 64   Temp 98.6 F (37 C) (Oral)   Resp 16   Ht _0  (1.651 m)   Wt 181 lb 6.4 oz (82.3 kg)   SpO2 95%   BMI 30.19 kg/m  Wt Readings from Last 3 Encounters:  07/24/16 181 lb 6.4 oz (82.3 kg)  03/23/16 186 lb 6.4 oz (84.6 kg)  11/22/15 184 lb 4 oz (83.6 kg)     Lab Results  Component Value Date   WBC 5.4 03/19/2016   HGB 14.8 03/19/2016   HCT 42.5 03/19/2016   PLT 165.0 03/19/2016   GLUCOSE 128 (H) 07/23/2016   CHOL 211 (H) 07/23/2016   TRIG 123.0 07/23/2016   HDL 61.80 07/23/2016   LDLCALC 125 (H) 07/23/2016   ALT 28 07/23/2016   AST 29 07/23/2016   NA 142 07/23/2016   K 4.6 07/23/2016   CL 107 07/23/2016   CREATININE 0.72 07/23/2016   BUN 15 07/23/2016   CO2 27 07/23/2016   TSH 1.61 07/23/2016   HGBA1C 6.4 07/23/2016    Mm Digital Screening Bilateral  Result Date: 07/29/2015 CLINICAL DATA:  Screening. EXAM: DIGITAL SCREENING BILATERAL MAMMOGRAM WITH CAD COMPARISON:  Previous exam(s). ACR Breast Density Category b: There  are scattered areas of fibroglandular density. FINDINGS: There are no findings suspicious for malignancy. Images were processed with CAD. IMPRESSION: No mammographic evidence of malignancy. A result letter of this screening mammogram will be mailed directly to the patient. RECOMMENDATION: Screening mammogram in one year. (Code:SM-B-01Y) BI-RADS CATEGORY  1: Negative. Electronically Signed   By: Margarette Canada M.D.   On: 07/29/2015 11:19       Assessment & Plan:   Problem List Items Addressed This Visit    Glaucoma    Followed by opthalmology.       Relevant Medications   brimonidine-timolol (COMBIGAN) 0.2-0.5 % ophthalmic solution   travoprost, benzalkonium, (TRAVATAN) 0.004 % ophthalmic solution   Timolol Maleate (ISTALOL) 0.5 % (DAILY) SOLN   Health care maintenance  Physical today 07/24/16.  Mammogram 07/29/15 - Birads I.  Scheduled for f/u mammogram.  Colonoscopy 06/2009.  Planning for colonoscopy.        History of CVA (cerebrovascular accident)    Followed by Dr Manuella Ghazi.  Daily aspirin.        Hypercholesteremia    Low cholesterol diet and exercise.  Follow lipid panel.        Relevant Orders   Hepatic function panel   Lipid panel   Hyperglycemia    Low carb diet and exercise.  Follow met b and a1c.        Relevant Orders   Hemoglobin H9Q   Basic metabolic panel   Plantar fasciitis    Has been seeing a chiropractor.  Follow.        Thrombocytopenia (HCC)    Last platelet count wnl.  Follow.       Relevant Orders   CBC with Differential/Platelet    Other Visit Diagnoses    Left shoulder pain, unspecified chronicity    -  Primary   tylenol.  desires no further intervention at this time.  follow.         Einar Pheasant, MD

## 2016-07-24 NOTE — Progress Notes (Signed)
Pre-visit discussion using our clinic review tool. No additional management support is needed unless otherwise documented below in the visit note.  

## 2016-07-26 NOTE — Telephone Encounter (Signed)
Letter mailed

## 2016-07-30 ENCOUNTER — Ambulatory Visit: Payer: PPO

## 2016-08-05 ENCOUNTER — Encounter: Payer: Self-pay | Admitting: Internal Medicine

## 2016-08-05 NOTE — Assessment & Plan Note (Signed)
Followed by opthalmology.       

## 2016-08-05 NOTE — Assessment & Plan Note (Signed)
Has been seeing a Restaurant manager, fast food.  Follow.

## 2016-08-05 NOTE — Assessment & Plan Note (Signed)
Last platelet count wnl.  Follow.  

## 2016-08-05 NOTE — Assessment & Plan Note (Signed)
Physical today 07/24/16.  Mammogram 07/29/15 - Birads I.  Scheduled for f/u mammogram.  Colonoscopy 06/2009.  Planning for colonoscopy.

## 2016-08-05 NOTE — Assessment & Plan Note (Signed)
Followed by Dr Manuella Ghazi.  Daily aspirin.

## 2016-08-05 NOTE — Assessment & Plan Note (Signed)
Low carb diet and exercise.  Follow met b and a1c.   

## 2016-08-05 NOTE — Assessment & Plan Note (Signed)
Low cholesterol diet and exercise.  Follow lipid panel.   

## 2016-08-13 ENCOUNTER — Ambulatory Visit
Admission: RE | Admit: 2016-08-13 | Discharge: 2016-08-13 | Disposition: A | Discharge status: Home / Self Care | Payer: PPO | Source: Ambulatory Visit | Attending: Internal Medicine | Admitting: Internal Medicine

## 2016-08-13 DIAGNOSIS — Z1231 Encounter for screening mammogram for malignant neoplasm of breast: Secondary | ICD-10-CM | POA: Insufficient documentation

## 2016-08-29 DIAGNOSIS — H401123 Primary open-angle glaucoma, left eye, severe stage: Secondary | ICD-10-CM | POA: Diagnosis not present

## 2016-10-08 ENCOUNTER — Encounter: Payer: Self-pay | Admitting: *Deleted

## 2016-10-09 ENCOUNTER — Encounter
Admission: RE | Disposition: A | Discharge status: Home / Self Care | Payer: Self-pay | Source: Ambulatory Visit | Attending: Gastroenterology

## 2016-10-09 ENCOUNTER — Encounter: Payer: Self-pay | Admitting: *Deleted

## 2016-10-09 ENCOUNTER — Ambulatory Visit: Payer: PPO | Admitting: Anesthesiology

## 2016-10-09 ENCOUNTER — Ambulatory Visit
Admission: RE | Admit: 2016-10-09 | Discharge: 2016-10-09 | Disposition: A | Discharge status: Home / Self Care | Payer: PPO | Source: Ambulatory Visit | Attending: Gastroenterology | Admitting: Gastroenterology

## 2016-10-09 DIAGNOSIS — Z86010 Personal history of colonic polyps: Secondary | ICD-10-CM | POA: Diagnosis not present

## 2016-10-09 DIAGNOSIS — K635 Polyp of colon: Secondary | ICD-10-CM | POA: Diagnosis not present

## 2016-10-09 DIAGNOSIS — D124 Benign neoplasm of descending colon: Secondary | ICD-10-CM | POA: Diagnosis not present

## 2016-10-09 DIAGNOSIS — Z8673 Personal history of transient ischemic attack (TIA), and cerebral infarction without residual deficits: Secondary | ICD-10-CM | POA: Insufficient documentation

## 2016-10-09 DIAGNOSIS — Z800 Family history of malignant neoplasm of digestive organs: Secondary | ICD-10-CM | POA: Diagnosis not present

## 2016-10-09 DIAGNOSIS — K5790 Diverticulosis of intestine, part unspecified, without perforation or abscess without bleeding: Secondary | ICD-10-CM | POA: Insufficient documentation

## 2016-10-09 DIAGNOSIS — K621 Rectal polyp: Secondary | ICD-10-CM | POA: Diagnosis not present

## 2016-10-09 DIAGNOSIS — Z7982 Long term (current) use of aspirin: Secondary | ICD-10-CM | POA: Insufficient documentation

## 2016-10-09 DIAGNOSIS — Z79899 Other long term (current) drug therapy: Secondary | ICD-10-CM | POA: Insufficient documentation

## 2016-10-09 DIAGNOSIS — E7800 Pure hypercholesterolemia, unspecified: Secondary | ICD-10-CM | POA: Insufficient documentation

## 2016-10-09 DIAGNOSIS — D125 Benign neoplasm of sigmoid colon: Secondary | ICD-10-CM | POA: Diagnosis not present

## 2016-10-09 DIAGNOSIS — D128 Benign neoplasm of rectum: Secondary | ICD-10-CM | POA: Diagnosis not present

## 2016-10-09 DIAGNOSIS — Z09 Encounter for follow-up examination after completed treatment for conditions other than malignant neoplasm: Secondary | ICD-10-CM | POA: Insufficient documentation

## 2016-10-09 DIAGNOSIS — D122 Benign neoplasm of ascending colon: Secondary | ICD-10-CM | POA: Diagnosis not present

## 2016-10-09 DIAGNOSIS — K5730 Diverticulosis of large intestine without perforation or abscess without bleeding: Secondary | ICD-10-CM | POA: Diagnosis not present

## 2016-10-09 HISTORY — DX: Benign neoplasm of colon, unspecified: D12.6

## 2016-10-09 HISTORY — DX: Diverticulosis of intestine, part unspecified, without perforation or abscess without bleeding: K57.90

## 2016-10-09 HISTORY — PX: COLONOSCOPY WITH PROPOFOL: SHX5780

## 2016-10-09 LAB — CBC WITH DIFFERENTIAL/PLATELET
BASOS ABS: 0 10*3/uL (ref 0–0.1)
Basophils Relative: 1 %
Eosinophils Absolute: 0.1 10*3/uL (ref 0–0.7)
Eosinophils Relative: 1 %
HEMATOCRIT: 42.8 % (ref 35.0–47.0)
HEMOGLOBIN: 15 g/dL (ref 12.0–16.0)
LYMPHS PCT: 24 %
Lymphs Abs: 1.4 10*3/uL (ref 1.0–3.6)
MCH: 32.2 pg (ref 26.0–34.0)
MCHC: 35.2 g/dL (ref 32.0–36.0)
MCV: 91.6 fL (ref 80.0–100.0)
Monocytes Absolute: 0.3 10*3/uL (ref 0.2–0.9)
Monocytes Relative: 6 %
NEUTROS ABS: 4.1 10*3/uL (ref 1.4–6.5)
Neutrophils Relative %: 68 %
PLATELETS: 161 10*3/uL (ref 150–440)
RBC: 4.67 MIL/uL (ref 3.80–5.20)
RDW: 13.4 % (ref 11.5–14.5)
WBC: 5.9 10*3/uL (ref 3.6–11.0)

## 2016-10-09 SURGERY — COLONOSCOPY WITH PROPOFOL
Anesthesia: General

## 2016-10-09 MED ORDER — EPHEDRINE SULFATE 50 MG/ML IJ SOLN
INTRAMUSCULAR | Status: DC | PRN
  Administered 2016-10-09: 10 mg via INTRAVENOUS

## 2016-10-09 MED ORDER — SODIUM CHLORIDE 0.9 % IV SOLN: INTRAVENOUS | Status: DC

## 2016-10-09 MED ORDER — GLYCOPYRROLATE 0.2 MG/ML IJ SOLN
INTRAMUSCULAR | Status: AC
  Filled 2016-10-09: qty 1

## 2016-10-09 MED ORDER — MIDAZOLAM HCL 2 MG/2ML IJ SOLN
INTRAMUSCULAR | Status: DC | PRN
  Administered 2016-10-09: 1 mg via INTRAVENOUS

## 2016-10-09 MED ORDER — LIDOCAINE HCL (CARDIAC) 20 MG/ML IV SOLN
INTRAVENOUS | Status: DC | PRN
  Administered 2016-10-09: 40 mg via INTRAVENOUS

## 2016-10-09 MED ORDER — SODIUM CHLORIDE 0.9 % IV SOLN
INTRAVENOUS | Status: DC
  Administered 2016-10-09: 1000 mL via INTRAVENOUS
  Administered 2016-10-09: 10:00:00 via INTRAVENOUS

## 2016-10-09 MED ORDER — PROPOFOL 10 MG/ML IV BOLUS
INTRAVENOUS | Status: AC
  Filled 2016-10-09: qty 20

## 2016-10-09 MED ORDER — FENTANYL CITRATE (PF) 100 MCG/2ML IJ SOLN
INTRAMUSCULAR | Status: DC | PRN
  Administered 2016-10-09: 50 ug via INTRAVENOUS

## 2016-10-09 MED ORDER — FENTANYL CITRATE (PF) 100 MCG/2ML IJ SOLN
INTRAMUSCULAR | Status: AC
  Filled 2016-10-09: qty 2

## 2016-10-09 MED ORDER — EPHEDRINE SULFATE 50 MG/ML IJ SOLN
INTRAMUSCULAR | Status: AC
  Filled 2016-10-09: qty 1

## 2016-10-09 MED ORDER — MIDAZOLAM HCL 2 MG/2ML IJ SOLN
INTRAMUSCULAR | Status: AC
  Filled 2016-10-09: qty 2

## 2016-10-09 MED ORDER — PROPOFOL 500 MG/50ML IV EMUL
INTRAVENOUS | Status: DC | PRN
  Administered 2016-10-09: 150 ug/kg/min via INTRAVENOUS

## 2016-10-09 MED ORDER — LIDOCAINE HCL 2 % IJ SOLN
INTRAMUSCULAR | Status: AC
  Filled 2016-10-09: qty 10

## 2016-10-09 MED ORDER — PHENYLEPHRINE HCL 10 MG/ML IJ SOLN
INTRAMUSCULAR | Status: AC
  Filled 2016-10-09: qty 1

## 2016-10-09 MED ORDER — PROPOFOL 10 MG/ML IV BOLUS
INTRAVENOUS | Status: DC | PRN
  Administered 2016-10-09: 30 mg via INTRAVENOUS

## 2016-10-09 MED ORDER — PHENYLEPHRINE HCL 10 MG/ML IJ SOLN
INTRAMUSCULAR | Status: DC | PRN
  Administered 2016-10-09 (×2): 100 ug via INTRAVENOUS

## 2016-10-09 NOTE — Anesthesia Post-op Follow-up Note (Cosign Needed)
Anesthesia QCDR form completed.        

## 2016-10-09 NOTE — Transfer of Care (Signed)
Immediate Anesthesia Transfer of Care Note  Patient: Victoria Harris  Procedure(s) Performed: Procedure(s): COLONOSCOPY WITH PROPOFOL (N/A)  Patient Location: PACU  Anesthesia Type:General  Level of Consciousness: awake  Airway & Oxygen Therapy: Patient Spontanous Breathing and Patient connected to nasal cannula oxygen  Post-op Assessment: Report given to RN and Post -op Vital signs reviewed and stable  Post vital signs: Reviewed and stable  Last Vitals:  Vitals:   10/09/16 0802 10/09/16 1115  BP: 132/80 97/61  Pulse: 64 65  Resp: 17 18  Temp: 36.3 C 36.3 C    Last Pain:  Vitals:   10/09/16 1115  TempSrc: Tympanic         Complications: No apparent anesthesia complications

## 2016-10-09 NOTE — Anesthesia Procedure Notes (Signed)
Date/Time: 10/09/2016 10:15 AM Performed by: Allean Found Pre-anesthesia Checklist: Patient identified, Emergency Drugs available, Suction available, Patient being monitored and Timeout performed Patient Re-evaluated:Patient Re-evaluated prior to inductionOxygen Delivery Method: Nasal cannula Placement Confirmation: positive ETCO2

## 2016-10-09 NOTE — Anesthesia Preprocedure Evaluation (Signed)
Anesthesia Evaluation  Patient identified by MRN, date of birth, ID band Patient awake    Reviewed: Allergy & Precautions, H&P , NPO status , Patient's Chart, lab work & pertinent test results, reviewed documented beta blocker date and time   Airway Mallampati: II   Neck ROM: full    Dental  (+) Poor Dentition   Pulmonary neg pulmonary ROS,    Pulmonary exam normal        Cardiovascular negative cardio ROS Normal cardiovascular exam Rhythm:regular Rate:Normal     Neuro/Psych CVA, No Residual Symptoms negative neurological ROS  negative psych ROS   GI/Hepatic negative GI ROS, Neg liver ROS,   Endo/Other  negative endocrine ROS  Renal/GU negative Renal ROS  negative genitourinary   Musculoskeletal   Abdominal   Peds  Hematology negative hematology ROS (+)   Anesthesia Other Findings Past Medical History: No date: Diverticulosis No date: Hypercholesterolemia No date: Serrated adenoma of colon No date: Stroke (cerebrum) (HCC) No date: Thrombocytopenia ([place]) No date: Uterus descensus Past Surgical History: 2009: ABDOMINAL HYSTERECTOMY 2007: CHOLECYSTECTOMY 04/05/2015: COLONOSCOPY N/A     Comment: Procedure: COLONOSCOPY;  Surgeon: Lollie Sails, MD;  Location: [facility] ENDOSCOPY;                Service: Endoscopy;  Laterality: N/A; 2001 and 2006: [place] OF UTERUS 2007: FOOT SURGERY 1960: TONSILLECTOMY No date: TUBAL LIGATION No date: vocal cord cyst removed BMI    Body Mass Index:  30.12 kg/m     Reproductive/Obstetrics negative OB ROS                             Anesthesia Physical Anesthesia Plan  ASA: III  Anesthesia Plan: General   Post-op Pain Management:    Induction:   Airway Management Planned:   Additional Equipment:   Intra-op Plan:   Post-operative Plan:   Informed Consent: I have reviewed the patients History and Physical,  chart, labs and discussed the procedure including the risks, benefits and alternatives for the proposed anesthesia with the patient or authorized representative who has indicated his/her understanding and acceptance.   Dental Advisory Given  Plan Discussed with: CRNA  Anesthesia Plan Comments:         Anesthesia Quick Evaluation

## 2016-10-09 NOTE — Op Note (Signed)
[facility] Patient Name: Victoria Harris Procedure Date: 10/09/2016 9:59 AM MRN: [id number] Account #: [id number] Date of Birth: 1950-10-17 Admit Type: Outpatient Age: 66 Room: [facility] ENDO ROOM 3 Gender: Female Note Status: Finalized Procedure:            Colonoscopy Indications:          Personal history of colonic polyps Providers:            Lollie Sails, MD Referring MD:         Einar Pheasant, MD (Referring MD) Medicines:            Monitored Anesthesia Care Complications:        No immediate complications. Procedure:            Pre-Anesthesia Assessment:                       - ASA Grade Assessment: III - A patient with severe                        systemic disease.                       After obtaining informed consent, the colonoscope was                        passed under direct vision. Throughout the procedure,                        the patient's blood pressure, pulse, and oxygen                        saturations were monitored continuously. The                        Colonoscope was introduced through the anus and                        advanced to the the cecum, identified by appendiceal                        orifice and ileocecal valve. The colonoscopy was                        performed without difficulty. The patient tolerated the                        procedure well. The quality of the bowel preparation                        was good. Findings:      A 4 mm polyp was found in the sigmoid colon. The polyp was sessile. The       polyp was removed with a cold snare. Resection and retrieval were       complete.      A localized area of granular mucosa was found in the ascending colon       adjacent to tatoo, separate from the spot next removed. Biopsies were       taken with a cold forceps for removal/histology. Removed completely.      A localized area of granular mucosa was found in the ascending colon  adjacent to tatoo. Biopsies were taken with a cold forceps for       removal/histology. Removed completely.      A 2 mm polyp was found in the descending colon. The polyp was sessile.       The polyp was removed with a cold biopsy forceps. Resection and       retrieval were complete.      A 4 mm polyp was found in the descending colon. The polyp was sessile.       The polyp was removed with a cold snare. Resection and retrieval were       complete.      -there was no overt polyp residual noted at the tatoo site.      Two sessile polyps were found in the proximal sigmoid colon. The polyps       were 1 to 2 mm in size. These polyps were removed with a cold biopsy       forceps. Resection and retrieval were complete.      A less than 1 mm polyp was found in the rectum. The polyp was sessile.       The polyp was removed with a cold biopsy forceps. Resection and       retrieval were complete.      Multiple small and large-mouthed diverticula were found in the sigmoid       colon, descending colon, transverse colon and ascending colon.      The digital rectal exam was normal. Impression:           - One 4 mm polyp in the sigmoid colon, removed with a                        cold snare. Resected and retrieved.                       - Granularity in the ascending colon. Biopsied.                       - Granularity in the ascending colon. Biopsied.                       - One 2 mm polyp in the descending colon, removed with                        a cold biopsy forceps. Resected and retrieved.                       - One 4 mm polyp in the descending colon, removed with                        a cold snare. Resected and retrieved.                       - Two 1 to 2 mm polyps in the proximal sigmoid colon,                        removed with a cold biopsy forceps. Resected and                        retrieved.                       -  One less than 1 mm polyp in the rectum, removed with                         a cold biopsy forceps. Resected and retrieved.                       - Diverticulosis in the sigmoid colon, in the                        descending colon, in the transverse colon and in the                        ascending colon. Recommendation:       - Discharge patient to home.                       - Telephone GI clinic for pathology results in 1 week. Procedure Code(s):    --- Professional ---                       [id number], Colonoscopy, flexible; with removal of tumor(s),                        polyp(s), or other lesion(s) by snare technique                       45380, 50, Colonoscopy, flexible; with biopsy, single                        or multiple Diagnosis Code(s):    --- Professional ---                       D12.5, Benign neoplasm of sigmoid colon                       D12.4, Benign neoplasm of descending colon                       K62.1, Rectal polyp                       K63.89, Other specified diseases of intestine                       Z86.010, Personal history of colonic polyps                       K57.30, Diverticulosis of large intestine without                        perforation or abscess without bleeding CPT copyright 2016 American Medical Association. All rights reserved. The codes documented in this report are preliminary and upon coder review may  be revised to meet current compliance requirements. Lollie Sails, MD 10/09/2016 11:13:28 AM This report has been signed electronically. Number of Addenda: 0 Note Initiated On: 10/09/2016 9:59 AM Scope Withdrawal Time: 0 hours 17 minutes 49 seconds  Total Procedure Duration: 0 hours 32 minutes 41 seconds       [facility]

## 2016-10-09 NOTE — H&P (Signed)
Outpatient short stay form Pre-procedure 10/09/2016 10:09 AM Victoria Sails MD  Primary Physician: Dr. Einar Pheasant  Reason for visit:  Colonoscopy  History of present illness:  Patient is a 66 year old female presenting today as above. Her last colonoscopy was 04/05/2015. There is finding of some granular-appearing mucosa in the mid ascending which was removed and tattooed this being consistent with serrated sessile adenoma. He is present presenting today for recheck. She tolerated her prep well. She takes a daily 81 mg aspirin that has been held for possible was 2 days. She takes no other aspirin or blood thinning agents.    Current Facility-Administered Medications:  .  0.9 %  sodium chloride infusion, , Intravenous, Continuous, Victoria Sails, MD, Last Rate: 20 mL/hr at 10/09/16 0841, 1,000 mL at 10/09/16 0841 .  0.9 %  sodium chloride infusion, , Intravenous, Continuous, Victoria Sails, MD  Prescriptions Prior to Admission  Medication Sig Dispense Refill Last Dose  . aspirin 81 MG tablet Take 81 mg by mouth daily.   10/08/2016 at Unknown time  . Cholecalciferol (VITAMIN D-1000 MAX ST) 1000 units tablet Take by mouth.   Past Week at Unknown time  . colestipol (COLESTID) 1 G tablet Take 1 g by mouth as needed.   Past Week at Unknown time  . omega-3 acid ethyl esters (LOVAZA) 1 g capsule Take by mouth.   Past Week at Unknown time  . Saccharomyces boulardii (FLORASTOR PO) Take by mouth daily as needed.   Past Week at Unknown time  . travoprost, benzalkonium, (TRAVATAN) 0.004 % ophthalmic solution Place 1 drop into the left eye daily.   10/08/2016 at Unknown time  . brimonidine-timolol (COMBIGAN) 0.2-0.5 % ophthalmic solution Place 1 drop into both eyes 2 (two) times daily.   Completed Course at Unknown time  . Multiple Vitamin (MULTI-VITAMINS) TABS Take by mouth.   Completed Course at Unknown time  . OVER THE COUNTER MEDICATION Vitamin pack   Taking  . Timolol Maleate (ISTALOL)  0.5 % (DAILY) SOLN Apply to eye.   Completed Course at Unknown time     Allergies  Allergen Reactions  . No Known Drug Allergy      Past Medical History:  Diagnosis Date  . Diverticulosis   . Hypercholesterolemia   . Serrated adenoma of colon   . Stroke (cerebrum) ([place])   . Thrombocytopenia ([place])   . Uterus descensus     Review of systems:      Physical Exam    Heart and lungs: Regular rate and rhythm without rub or gallop, lungs are bilaterally clear.    HEENT: Normocephalic atraumatic eyes are anicteric    Other:     Pertinant exam for procedure: Soft nontender nondistended bowel sounds positive normoactive.    Planned proceedures: Colonoscopy and indicated procedures. I have discussed the risks benefits and complications of procedures to include not limited to bleeding, infection, perforation and the risk of sedation and the patient wishes to proceed.    Victoria Sails, MD Gastroenterology 10/09/2016  10:09 AM

## 2016-10-10 ENCOUNTER — Encounter: Payer: Self-pay | Admitting: Gastroenterology

## 2016-10-10 LAB — SURGICAL PATHOLOGY

## 2016-10-10 NOTE — Anesthesia Postprocedure Evaluation (Signed)
Anesthesia Post Note  Patient: Victoria Harris  Procedure(s) Performed: Procedure(s) (LRB): COLONOSCOPY WITH PROPOFOL (N/A)  Patient location during evaluation: PACU Anesthesia Type: General Level of consciousness: awake and alert Pain management: pain level controlled Vital Signs Assessment: post-procedure vital signs reviewed and stable Respiratory status: spontaneous breathing, nonlabored ventilation, respiratory function stable and patient connected to nasal cannula oxygen Cardiovascular status: blood pressure returned to baseline and stable Postop Assessment: no signs of nausea or vomiting Anesthetic complications: no     Last Vitals:  Vitals:   10/09/16 1140 10/09/16 1155  BP: 116/72 117/70  Pulse: (!) 54 (!) 50  Resp: 13 15  Temp:      Last Pain:  Vitals:   10/10/16 0744  TempSrc:   PainSc: 0-No pain                 Molli Barrows

## 2016-11-20 ENCOUNTER — Other Ambulatory Visit: Payer: PPO

## 2016-11-21 ENCOUNTER — Other Ambulatory Visit: Payer: PPO

## 2016-11-22 ENCOUNTER — Ambulatory Visit: Payer: PPO | Admitting: Internal Medicine

## 2016-11-27 ENCOUNTER — Other Ambulatory Visit (INDEPENDENT_AMBULATORY_CARE_PROVIDER_SITE_OTHER): Payer: PPO

## 2016-11-27 DIAGNOSIS — D696 Thrombocytopenia, unspecified: Secondary | ICD-10-CM

## 2016-11-27 DIAGNOSIS — H401122 Primary open-angle glaucoma, left eye, moderate stage: Secondary | ICD-10-CM | POA: Diagnosis not present

## 2016-11-27 DIAGNOSIS — E7800 Pure hypercholesterolemia, unspecified: Secondary | ICD-10-CM | POA: Diagnosis not present

## 2016-11-27 DIAGNOSIS — R739 Hyperglycemia, unspecified: Secondary | ICD-10-CM | POA: Diagnosis not present

## 2016-11-27 LAB — CBC WITH DIFFERENTIAL/PLATELET
BASOS PCT: 0.4 % (ref 0.0–3.0)
Basophils Absolute: 0 10*3/uL (ref 0.0–0.1)
EOS ABS: 0.1 10*3/uL (ref 0.0–0.7)
EOS PCT: 1.6 % (ref 0.0–5.0)
HEMATOCRIT: 41.5 % (ref 36.0–46.0)
HEMOGLOBIN: 14.2 g/dL (ref 12.0–15.0)
LYMPHS PCT: 28.8 % (ref 12.0–46.0)
Lymphs Abs: 1.6 10*3/uL (ref 0.7–4.0)
MCHC: 34.2 g/dL (ref 30.0–36.0)
MCV: 93.1 fl (ref 78.0–100.0)
Monocytes Absolute: 0.3 10*3/uL (ref 0.1–1.0)
Monocytes Relative: 5.3 % (ref 3.0–12.0)
Neutro Abs: 3.6 10*3/uL (ref 1.4–7.7)
Neutrophils Relative %: 63.9 % (ref 43.0–77.0)
Platelets: 189 10*3/uL (ref 150.0–400.0)
RBC: 4.46 Mil/uL (ref 3.87–5.11)
RDW: 13.3 % (ref 11.5–15.5)
WBC: 5.6 10*3/uL (ref 4.0–10.5)

## 2016-11-27 LAB — BASIC METABOLIC PANEL
BUN: 15 mg/dL (ref 6–23)
CHLORIDE: 108 meq/L (ref 96–112)
CO2: 30 mEq/L (ref 19–32)
CREATININE: 0.73 mg/dL (ref 0.40–1.20)
Calcium: 9.4 mg/dL (ref 8.4–10.5)
GFR: 84.51 mL/min (ref >60.00)
Glucose, Bld: 132 mg/dL — ABNORMAL HIGH (ref 70–99)
POTASSIUM: 4 meq/L (ref 3.5–5.1)
Sodium: 143 mEq/L (ref 135–145)

## 2016-11-27 LAB — HEPATIC FUNCTION PANEL
ALBUMIN: 4.1 g/dL (ref 3.5–5.2)
ALT: 35 U/L (ref 0–35)
AST: 26 U/L (ref 0–37)
Alkaline Phosphatase: 60 U/L (ref 39–117)
Bilirubin, Direct: 0.1 mg/dL (ref 0.0–0.3)
Total Bilirubin: 0.7 mg/dL (ref 0.2–1.2)
Total Protein: 6.5 g/dL (ref 6.0–8.3)

## 2016-11-27 LAB — HEMOGLOBIN A1C: HEMOGLOBIN A1C: 6.2 % (ref 4.6–6.5)

## 2016-11-27 LAB — LIPID PANEL
CHOLESTEROL: 144 mg/dL (ref 0–200)
HDL: 48.4 mg/dL (ref >39.00)
LDL Cholesterol: 73 mg/dL (ref 0–99)
NonHDL: 95.89
Total CHOL/HDL Ratio: 3
Triglycerides: 113 mg/dL (ref 0.0–149.0)
VLDL: 22.6 mg/dL (ref 0.0–40.0)

## 2016-11-28 ENCOUNTER — Encounter: Payer: Self-pay | Admitting: Internal Medicine

## 2016-11-29 ENCOUNTER — Encounter: Payer: Self-pay | Admitting: Internal Medicine

## 2016-11-29 ENCOUNTER — Ambulatory Visit (INDEPENDENT_AMBULATORY_CARE_PROVIDER_SITE_OTHER): Payer: PPO | Admitting: Internal Medicine

## 2016-11-29 VITALS — BP 120/72 | HR 64 | Temp 98.6°F | Resp 12 | Ht 65.0 in | Wt 184.4 lb

## 2016-11-29 DIAGNOSIS — E7800 Pure hypercholesterolemia, unspecified: Secondary | ICD-10-CM

## 2016-11-29 DIAGNOSIS — Z8673 Personal history of transient ischemic attack (TIA), and cerebral infarction without residual deficits: Secondary | ICD-10-CM | POA: Diagnosis not present

## 2016-11-29 DIAGNOSIS — R739 Hyperglycemia, unspecified: Secondary | ICD-10-CM

## 2016-11-29 DIAGNOSIS — D696 Thrombocytopenia, unspecified: Secondary | ICD-10-CM

## 2016-11-29 DIAGNOSIS — Z23 Encounter for immunization: Secondary | ICD-10-CM | POA: Diagnosis not present

## 2016-11-29 DIAGNOSIS — Z6830 Body mass index (BMI) 30.0-30.9, adult: Secondary | ICD-10-CM | POA: Diagnosis not present

## 2016-11-29 NOTE — Progress Notes (Signed)
[name], female   DOB: 09/23/1950, 66 y.o.   MRN: [id number]   Subjective:    [name], female    DOB: [birth year], 66 y.o.   MRN: [id number]  HPI  Patient here for a scheduled follow up.  She reports she is doing well.  Trying to stay active.  No chest pain.  No sob.  No acid reflux. No abdominal pain.  Bowels moving.  Recently had vomiting and diarrhea.  Was on a trip.  Other people on the trip were sick as well.  This resolved.  Back to normal now.  Overall she feels she is doing relatively well.  Discussed lab results.     Past Medical History:  Diagnosis Date  . Diverticulosis   . Hypercholesterolemia   . Serrated adenoma of colon   . Stroke (cerebrum) ([place])   . Thrombocytopenia ([place])   . Uterus descensus    Past Surgical History:  Procedure Laterality Date  . ABDOMINAL HYSTERECTOMY  2009  . CHOLECYSTECTOMY  2007  . COLONOSCOPY N/A 04/05/2015   Procedure: COLONOSCOPY;  Surgeon: Lollie Sails, MD;  Location: [facility] ENDOSCOPY;  Service: Endoscopy;  Laterality: N/A;  . COLONOSCOPY WITH PROPOFOL N/A 10/09/2016   Procedure: COLONOSCOPY WITH PROPOFOL;  Surgeon: Lollie Sails, MD;  Location: [facility] ENDOSCOPY;  Service: Endoscopy;  Laterality: N/A;  . DILATION AND CURETTAGE OF UTERUS  2001 and 2006  . FOOT SURGERY  2007  . TONSILLECTOMY  1960  . TUBAL LIGATION    . vocal cord cyst removed     Family History  Problem Relation Age of Onset  . Diabetes Unknown   . Colon cancer Mother   . Stroke Paternal Grandmother   . Stroke Paternal Grandfather   . Prostate cancer Father   . Breast cancer Paternal Aunt        x2  . Uterine cancer Maternal Aunt    Social History   Social History  . Marital status: Married    Spouse name: N/A  . Number of children: N/A  . Years of education: N/A   Social History Main Topics  . Smoking status: Never Smoker  . Smokeless tobacco: Never Used  . Alcohol use No  . Drug use: No  . Sexual  activity: Not Asked   Other Topics Concern  . None   Social History Narrative  . None    Outpatient Encounter Prescriptions as of 11/29/2016  Medication Sig  . aspirin 81 MG tablet Take 81 mg by mouth daily.  . Cholecalciferol (VITAMIN D-1000 MAX ST) 1000 units tablet Take by mouth.  . colestipol (COLESTID) 1 G tablet Take 1 g by mouth as needed.  Marland Kitchen OVER THE COUNTER MEDICATION Vitamin pack  . Saccharomyces boulardii (FLORASTOR PO) Take by mouth daily as needed.  . travoprost, benzalkonium, (TRAVATAN) 0.004 % ophthalmic solution Place 1 drop into the left eye daily.  . [DISCONTINUED] brimonidine-timolol (COMBIGAN) 0.2-0.5 % ophthalmic solution Place 1 drop into both eyes 2 (two) times daily.  . [DISCONTINUED] Multiple Vitamin (MULTI-VITAMINS) TABS Take by mouth.  . [DISCONTINUED] omega-3 acid ethyl esters (LOVAZA) 1 g capsule Take by mouth.  . [DISCONTINUED] Timolol Maleate (ISTALOL) 0.5 % (DAILY) SOLN Apply to eye.   No facility-administered encounter medications on file as of 11/29/2016.     Review of Systems  Constitutional: Negative for appetite change and unexpected weight change.  HENT: Negative for congestion and sinus pressure.   Respiratory: Negative for cough, chest  tightness and shortness of breath.   Cardiovascular: Negative for chest pain, palpitations and leg swelling.  Gastrointestinal: Negative for abdominal pain.       No vomiting or diarrhea now.  Resolved.  Bowels back to normal.    Genitourinary: Negative for difficulty urinating and dysuria.  Musculoskeletal: Negative for back pain and joint swelling.  Skin: Negative for color change and rash.  Neurological: Negative for dizziness, light-headedness and headaches.  Psychiatric/Behavioral: Negative for agitation and dysphoric mood.       Objective:    Physical Exam  Constitutional: She appears well-developed and well-nourished. No distress.  HENT:  Nose: Nose normal.  Mouth/Throat: Oropharynx is clear and  moist.  Neck: Neck supple. No thyromegaly present.  Cardiovascular: Normal rate and regular rhythm.   Pulmonary/Chest: Breath sounds normal. No respiratory distress. She has no wheezes.  Abdominal: Soft. Bowel sounds are normal. There is no tenderness.  Musculoskeletal: She exhibits no edema or tenderness.  Lymphadenopathy:    She has no cervical adenopathy.  Skin: No rash noted. No erythema.  Psychiatric: She has a normal mood and affect. Her behavior is normal.    BP 120/72 (BP Location: Left Arm, Patient Position: Sitting, Cuff Size: Normal)   Pulse 64   Temp 98.6 F (37 C) (Oral)   Resp 12   Ht 5' 5"  (1.651 m)   Wt 184 lb 6.4 oz (83.6 kg)   SpO2 96%   BMI 30.69 kg/m  Wt Readings from Last 3 Encounters:  11/29/16 184 lb 6.4 oz (83.6 kg)  10/09/16 181 lb (82.1 kg)  07/24/16 181 lb 6.4 oz (82.3 kg)     Lab Results  Component Value Date   WBC 5.6 11/27/2016   HGB 14.2 11/27/2016   HCT 41.5 11/27/2016   PLT 189.0 11/27/2016   GLUCOSE 132 (H) 11/27/2016   CHOL 144 11/27/2016   TRIG 113.0 11/27/2016   HDL 48.40 11/27/2016   LDLCALC 73 11/27/2016   ALT 35 11/27/2016   AST 26 11/27/2016   NA 143 11/27/2016   K 4.0 11/27/2016   CL 108 11/27/2016   CREATININE 0.73 11/27/2016   BUN 15 11/27/2016   CO2 30 11/27/2016   TSH 1.61 07/23/2016   HGBA1C 6.2 11/27/2016    Mm Digital Screening Bilateral  Result Date: 08/13/2016 CLINICAL DATA:  Screening. EXAM: DIGITAL SCREENING BILATERAL MAMMOGRAM WITH CAD COMPARISON:  Previous exam(s). ACR Breast Density Category b: There are scattered areas of fibroglandular density. FINDINGS: There are no findings suspicious for malignancy. Images were processed with CAD. IMPRESSION: No mammographic evidence of malignancy. A result letter of this screening mammogram will be mailed directly to the patient. RECOMMENDATION: Screening mammogram in one year. (Code:SM-B-01Y) BI-RADS CATEGORY  1: Negative. Electronically Signed   By: Lillia Mountain M.D.    On: 08/13/2016 10:56       Assessment & Plan:   Problem List Items Addressed This Visit    BMI 30.0-30.9,adult    Discussed diet and exercise.  Follow.        History of CVA (cerebrovascular accident)    Evaluated by Dr Manuella Ghazi.  Continue daily aspirin.        Hypercholesteremia    Low cholesterol diet and exercise.  Recent LDL 73.  Follow.        Hyperglycemia    Low carb diet and exercise.  Follow met b and a1c.   Lab Results  Component Value Date   HGBA1C 6.2 11/27/2016  Thrombocytopenia (HCC)    Recent platelet count wnl.  Follow.         Other Visit Diagnoses    Need for pneumococcal vaccination    -  Primary       Einar Pheasant, MD

## 2016-11-29 NOTE — Progress Notes (Signed)
Pre-visit discussion using our clinic review tool. No additional management support is needed unless otherwise documented below in the visit note.  

## 2016-11-29 NOTE — Telephone Encounter (Signed)
Hard copy mailed  

## 2016-12-01 ENCOUNTER — Encounter: Payer: Self-pay | Admitting: Internal Medicine

## 2016-12-01 NOTE — Assessment & Plan Note (Signed)
Evaluated by Dr Shah.  Continue daily aspirin.  

## 2016-12-01 NOTE — Assessment & Plan Note (Signed)
Discussed diet and exercise.  Follow.  

## 2016-12-01 NOTE — Assessment & Plan Note (Signed)
Low carb diet and exercise.  Follow met b and a1c.   Lab Results  Component Value Date   HGBA1C 6.2 11/27/2016

## 2016-12-01 NOTE — Assessment & Plan Note (Signed)
Recent platelet count wnl.  Follow.  °

## 2016-12-01 NOTE — Assessment & Plan Note (Signed)
Low cholesterol diet and exercise.  Recent LDL 73.  Follow.

## 2017-01-18 NOTE — Telephone Encounter (Signed)
Error

## 2017-01-30 ENCOUNTER — Telehealth: Payer: Self-pay | Admitting: Internal Medicine

## 2017-01-30 NOTE — Telephone Encounter (Signed)
Left pt message asking to call Ebony Hail back directly at [phone] to schedule AWV. Thanks!  *NOTE* Last AWV 07/19/15

## 2017-02-18 ENCOUNTER — Other Ambulatory Visit: Payer: Self-pay | Admitting: Internal Medicine

## 2017-02-18 DIAGNOSIS — D696 Thrombocytopenia, unspecified: Secondary | ICD-10-CM

## 2017-02-18 DIAGNOSIS — R739 Hyperglycemia, unspecified: Secondary | ICD-10-CM

## 2017-02-18 DIAGNOSIS — E7800 Pure hypercholesterolemia, unspecified: Secondary | ICD-10-CM

## 2017-02-18 NOTE — Progress Notes (Signed)
Order placed for f/u labs.  

## 2017-02-20 DIAGNOSIS — L918 Other hypertrophic disorders of the skin: Secondary | ICD-10-CM | POA: Diagnosis not present

## 2017-02-20 DIAGNOSIS — Z85828 Personal history of other malignant neoplasm of skin: Secondary | ICD-10-CM | POA: Diagnosis not present

## 2017-02-20 DIAGNOSIS — I781 Nevus, non-neoplastic: Secondary | ICD-10-CM | POA: Diagnosis not present

## 2017-02-20 DIAGNOSIS — L720 Epidermal cyst: Secondary | ICD-10-CM | POA: Diagnosis not present

## 2017-02-20 DIAGNOSIS — L821 Other seborrheic keratosis: Secondary | ICD-10-CM | POA: Diagnosis not present

## 2017-03-06 NOTE — Telephone Encounter (Signed)
Scheduled 04/02/17

## 2017-03-12 LAB — HM DIABETES EYE EXAM

## 2017-03-28 DIAGNOSIS — H401122 Primary open-angle glaucoma, left eye, moderate stage: Secondary | ICD-10-CM | POA: Diagnosis not present

## 2017-04-02 ENCOUNTER — Ambulatory Visit (INDEPENDENT_AMBULATORY_CARE_PROVIDER_SITE_OTHER): Payer: PPO

## 2017-04-02 VITALS — BP 126/76 | HR 54 | Temp 97.9°F | Resp 14 | Ht 65.0 in | Wt 184.8 lb

## 2017-04-02 DIAGNOSIS — E7800 Pure hypercholesterolemia, unspecified: Secondary | ICD-10-CM

## 2017-04-02 DIAGNOSIS — Z1331 Encounter for screening for depression: Secondary | ICD-10-CM

## 2017-04-02 DIAGNOSIS — Z0000 Encounter for general adult medical examination without abnormal findings: Secondary | ICD-10-CM

## 2017-04-02 DIAGNOSIS — Z1159 Encounter for screening for other viral diseases: Secondary | ICD-10-CM

## 2017-04-02 DIAGNOSIS — R739 Hyperglycemia, unspecified: Secondary | ICD-10-CM | POA: Diagnosis not present

## 2017-04-02 DIAGNOSIS — D696 Thrombocytopenia, unspecified: Secondary | ICD-10-CM

## 2017-04-02 LAB — LIPID PANEL
CHOL/HDL RATIO: 4
CHOLESTEROL: 207 mg/dL — AB (ref 0–200)
HDL: 56.7 mg/dL (ref >39.00)
LDL CALC: 127 mg/dL — AB (ref 0–99)
NonHDL: 150.08
Triglycerides: 116 mg/dL (ref 0.0–149.0)
VLDL: 23.2 mg/dL (ref 0.0–40.0)

## 2017-04-02 LAB — BASIC METABOLIC PANEL
BUN: 18 mg/dL (ref 6–23)
CALCIUM: 9.9 mg/dL (ref 8.4–10.5)
CO2: 30 meq/L (ref 19–32)
CREATININE: 0.67 mg/dL (ref 0.40–1.20)
Chloride: 107 mEq/L (ref 96–112)
GFR: 93.2 mL/min (ref >60.00)
GLUCOSE: 128 mg/dL — AB (ref 70–99)
Potassium: 4.3 mEq/L (ref 3.5–5.1)
Sodium: 143 mEq/L (ref 135–145)

## 2017-04-02 LAB — HEPATIC FUNCTION PANEL
ALT: 20 U/L (ref 0–35)
AST: 18 U/L (ref 0–37)
Albumin: 4.3 g/dL (ref 3.5–5.2)
Alkaline Phosphatase: 54 U/L (ref 39–117)
BILIRUBIN DIRECT: 0.1 mg/dL (ref 0.0–0.3)
Total Bilirubin: 0.8 mg/dL (ref 0.2–1.2)
Total Protein: 6.8 g/dL (ref 6.0–8.3)

## 2017-04-02 LAB — CBC WITH DIFFERENTIAL/PLATELET
Basophils Absolute: 0 10*3/uL (ref 0.0–0.1)
Basophils Relative: 0.4 % (ref 0.0–3.0)
EOS PCT: 1.1 % (ref 0.0–5.0)
Eosinophils Absolute: 0.1 10*3/uL (ref 0.0–0.7)
HEMATOCRIT: 43.3 % (ref 36.0–46.0)
Hemoglobin: 14.8 g/dL (ref 12.0–15.0)
LYMPHS ABS: 1.6 10*3/uL (ref 0.7–4.0)
LYMPHS PCT: 30.5 % (ref 12.0–46.0)
MCHC: 34.2 g/dL (ref 30.0–36.0)
MCV: 94.4 fl (ref 78.0–100.0)
MONOS PCT: 5.7 % (ref 3.0–12.0)
Monocytes Absolute: 0.3 10*3/uL (ref 0.1–1.0)
NEUTROS PCT: 62.3 % (ref 43.0–77.0)
Neutro Abs: 3.2 10*3/uL (ref 1.4–7.7)
Platelets: 172 10*3/uL (ref 150.0–400.0)
RBC: 4.59 Mil/uL (ref 3.87–5.11)
RDW: 13.8 % (ref 11.5–15.5)
WBC: 5.2 10*3/uL (ref 4.0–10.5)

## 2017-04-02 LAB — HEMOGLOBIN A1C: Hgb A1c MFr Bld: 6.4 % (ref 4.6–6.5)

## 2017-04-02 NOTE — Patient Instructions (Addendum)
  Ms. Greggs , Thank you for taking time to come for your Medicare Wellness Visit. I appreciate your ongoing commitment to your health goals. Please review the following plan we discussed and let me know if I can assist you in the future.   Follow up with Dr. Nicki Reaper as needed.    Bring a copy of your [place] and/or Living Will to be scanned into chart.  Have a great day!  These are the goals we discussed:  Increase physical activity   This is a list of the screening recommended for you and due dates:  Health Maintenance  Topic Date Due  . Tetanus Vaccine  11/13/1968  . DEXA scan (bone density measurement)  11/14/2014  . Flu Shot  12/26/2016  . Mammogram  08/13/2017  . Colon Cancer Screening  10/10/2026  .  Hepatitis C: One time screening is recommended by Center for Disease Control  (CDC) for  adults born from [birth year] through 1966.   Completed  . Pneumonia vaccines  Completed

## 2017-04-02 NOTE — Progress Notes (Signed)
Subjective:   Victoria Harris is a 66 y.o. female who presents for Medicare Annual (Subsequent) preventive examination.  Review of Systems:  No ROS.  Medicare Wellness Visit. Additional risk factors are reflected in the social history.  Cardiac Risk Factors include: obesity (BMI >30kg/m2);advanced age (>6men, >11 women)     Objective:     Vitals: BP 126/76 (BP Location: Left Arm, Patient Position: Sitting, Cuff Size: Normal)   Pulse (!) 54   Temp 97.9 F (36.6 C) (Oral)   Resp 14   Ht 5\' 5"  (1.651 m)   Wt 184 lb 12.8 oz (83.8 kg)   SpO2 95%   BMI 30.75 kg/m   Body mass index is 30.75 kg/m.   Tobacco Social History   Tobacco Use  Smoking Status Never Smoker  Smokeless Tobacco Never Used     Counseling given: Not Answered   Past Medical History:  Diagnosis Date  . Diverticulosis   . Hypercholesterolemia   . Serrated adenoma of colon   . Stroke (cerebrum) ([place])   . Thrombocytopenia ([place])   . Uterus descensus    Past Surgical History:  Procedure Laterality Date  . ABDOMINAL HYSTERECTOMY  2009  . CHOLECYSTECTOMY  2007  . [place] OF UTERUS  2001 and 2006  . FOOT SURGERY  2007  . TONSILLECTOMY  1960  . TUBAL LIGATION    . vocal cord cyst removed     Family History  Problem Relation Age of Onset  . Diabetes Unknown   . Colon cancer Mother   . Diabetes Mother   . Kidney disease Mother   . Heart disease Mother   . Stroke Paternal Grandmother   . Stroke Paternal Grandfather   . Prostate cancer Father   . Diabetes Father   . Breast cancer Paternal Aunt        x2  . Uterine cancer Maternal Aunt   . Diabetes Brother   . Diabetes Brother    Social History   Substance and Sexual Activity  Sexual Activity No    Outpatient Encounter Medications as of 04/02/2017  Medication Sig  . aspirin 81 MG tablet Take 81 mg by mouth daily.  . brimonidine-timolol (COMBIGAN) 0.2-0.5 % ophthalmic solution 1 drop 2 (two) times daily.  .  Cholecalciferol (VITAMIN D-1000 MAX ST) 1000 units tablet Take by mouth.  . colestipol (COLESTID) 1 G tablet Take 1 g by mouth as needed.  Marland Kitchen OVER THE COUNTER MEDICATION Vitamin pack  . Saccharomyces boulardii (FLORASTOR PO) Take by mouth daily as needed.  . travoprost, benzalkonium, (TRAVATAN) 0.004 % ophthalmic solution Place 1 drop into the left eye daily.   No facility-administered encounter medications on file as of 04/02/2017.     Activities of Daily Living In your present state of health, do you have any difficulty performing the following activities: 04/02/2017  Hearing? N  Vision? N  Difficulty concentrating or making decisions? N  Walking or climbing stairs? N  Dressing or bathing? N  Doing errands, shopping? N  Preparing Food and eating ? N  Using the Toilet? N  In the past six months, have you accidently leaked urine? N  Do you have problems with loss of bowel control? Y  Comment Followed by GI and PCP  Managing your Medications? N  Managing your Finances? N  Housekeeping or managing your Housekeeping? N  Some recent data might be hidden    Patient Care Team: Einar Pheasant, MD as PCP - General (Internal Medicine)  Assessment:    This is a routine wellness examination for Victoria Harris. The goal of the wellness visit is to assist the patient how to close the gaps in care and create a preventative care plan for the patient.   The roster of all physicians providing medical care to patient is listed in the Snapshot section of the chart.  Taking calcium VIT D as appropriate/Osteoporosis risk reviewed.    Safety issues reviewed; Smoke and carbon monoxide detectors in the home. No firearms in the home.  Wears seatbelts when driving or riding with others. Patient does wear sunscreen or protective clothing when in direct sunlight. No violence in the home.  Depression- PHQ 2 &9 complete.  No signs/symptoms or verbal communication regarding little pleasure in doing things,  feeling down, depressed or hopeless. No changes in sleeping, energy, eating, concentrating.  No thoughts of self harm or harm towards others.  Time spent on this topic is 8 minutes.   Patient is alert, normal appearance, oriented to person/place/and time. Correctly identified the president of the [place], recall of 3/3 words, and performing simple calculations. Displays appropriate judgement and can read correct time from watch face.   No new identified risk were noted.  No failures at ADL's or IADL's.   BMI- discussed the importance of a healthy diet, water intake and the benefits of aerobic exercise. Educational material provided.   24 hour diet recall: Low carb foods  Daily fluid intake: 1 cups of caffeine, 3 cups of water  Dental- every 6 months.  Dr. Sharlett Iles.  Eye- Visual acuity not assessed per patient preference since they have regular follow up with the ophthalmologist.  Wears corrective lenses.  Sleep patterns- Sleeps 7-8 hours at night.  Wakes feeling rested.  Naps during the day as needed.  TDAP vaccine deferred per patient preference.  Follow up with insurance.  Educational material provided.  Labs completed.  Influenza vaccine discussed.    Patient Concerns: None at this time. Follow up with PCP as needed.  Exercise Activities and Dietary recommendations Current Exercise Habits: Home exercise routine, Type of exercise: walking, Frequency (Times/Week): 1, Intensity: Moderate  Fall Risk Fall Risk  04/02/2017 03/23/2016 07/19/2015 07/05/2014 06/29/2012  Falls in the past year? No No No No No   Depression Screen PHQ 2/9 Scores 04/02/2017 03/23/2016 07/19/2015 07/05/2014  PHQ - 2 Score 0 0 0 0  PHQ- 9 Score 0 - - -     Cognitive Function MMSE - Mini Mental State Exam 04/02/2017  Orientation to time 5  Orientation to Place 5  Registration 3  Attention/ Calculation 5  Recall 3  Language- name 2 objects 2  Language- repeat 1  Language- follow 3 step command 3  Language-  read & follow direction 1  Write a sentence 1  Copy design 1  Total score 30        Immunization History  Administered Date(s) Administered  . Influenza, High Dose Seasonal PF 03/23/2016  . Influenza,inj,Quad PF,6+ Mos 05/25/2015  . Pneumococcal Conjugate-13 07/19/2015  . Pneumococcal Polysaccharide-23 11/29/2016  . Zoster 09/17/2013   Screening Tests Health Maintenance  Topic Date Due  . TETANUS/TDAP  11/13/1968  . DEXA SCAN  11/14/2014  . INFLUENZA VACCINE  12/26/2016  . MAMMOGRAM  08/13/2017  . COLONOSCOPY  10/10/2026  . Hepatitis C Screening  Completed  . PNA vac Low Risk Adult  Completed      Plan:    End of life planning; Advance aging; Advanced directives discussed. Copy of  current HCPOA/Living Will requested.    I have personally reviewed and noted the following in the patient's chart:   . Medical and social history . Use of alcohol, tobacco or illicit drugs  . Current medications and supplements . Functional ability and status . Nutritional status . Physical activity . Advanced directives . List of other physicians . Hospitalizations, surgeries, and ER visits in previous 12 months . Vitals . Screenings to include cognitive, depression, and falls . Referrals and appointments  In addition, I have reviewed and discussed with patient certain preventive protocols, quality metrics, and best practice recommendations. A written personalized care plan for preventive services as well as general preventive health recommendations were provided to patient.     Varney Biles, LPN  61/01/5092   Reviewed above information.  Agree with assessment and plan.    Dr Nicki Reaper

## 2017-04-03 LAB — HEPATITIS C ANTIBODY
Hepatitis C Ab: NONREACTIVE
SIGNAL TO CUT-OFF: 0.01 (ref <1.00)

## 2017-04-04 ENCOUNTER — Ambulatory Visit: Payer: PPO | Admitting: Internal Medicine

## 2017-04-04 ENCOUNTER — Encounter: Payer: Self-pay | Admitting: Internal Medicine

## 2017-04-04 DIAGNOSIS — D696 Thrombocytopenia, unspecified: Secondary | ICD-10-CM | POA: Diagnosis not present

## 2017-04-04 DIAGNOSIS — Z6830 Body mass index (BMI) 30.0-30.9, adult: Secondary | ICD-10-CM | POA: Diagnosis not present

## 2017-04-04 DIAGNOSIS — Z8673 Personal history of transient ischemic attack (TIA), and cerebral infarction without residual deficits: Secondary | ICD-10-CM | POA: Diagnosis not present

## 2017-04-04 DIAGNOSIS — E7800 Pure hypercholesterolemia, unspecified: Secondary | ICD-10-CM

## 2017-04-04 DIAGNOSIS — Z23 Encounter for immunization: Secondary | ICD-10-CM | POA: Diagnosis not present

## 2017-04-04 DIAGNOSIS — R739 Hyperglycemia, unspecified: Secondary | ICD-10-CM

## 2017-04-04 NOTE — Progress Notes (Signed)
[name], female   DOB: 10-06-1950, 66 y.o.   MRN: [id number]

## 2017-04-04 NOTE — Progress Notes (Signed)
[name], female   DOB: 1950-08-16, 66 y.o.   MRN: [id number]   Subjective:    [name], female    DOB: 1950-07-18, 66 y.o.   MRN: [id number]  HPI  Patient here for a scheduled follow up.  She is doing well.  Staying active.  Plans to do more formal exercise.  No chest pain.  No sob.  No acid reflux.  No abdominal pain.  Bowels moving.  Discussed lab results.  Discussed her calculated cholesterol risk.  She declines cholesterol medication.  Discussed low carb diet and exercise.     Past Medical History:  Diagnosis Date  . Diverticulosis   . Hypercholesterolemia   . Serrated adenoma of colon   . Stroke (cerebrum) ([place])   . Thrombocytopenia ([place])   . Uterus descensus    Past Surgical History:  Procedure Laterality Date  . ABDOMINAL HYSTERECTOMY  2009  . CHOLECYSTECTOMY  2007  . [place] OF UTERUS  2001 and 2006  . FOOT SURGERY  2007  . TONSILLECTOMY  1960  . TUBAL LIGATION    . vocal cord cyst removed     Family History  Problem Relation Age of Onset  . Diabetes Unknown   . Colon cancer Mother   . Diabetes Mother   . Kidney disease Mother   . Heart disease Mother   . Stroke Paternal Grandmother   . Stroke Paternal Grandfather   . Prostate cancer Father   . Diabetes Father   . Breast cancer Paternal Aunt        x2  . Uterine cancer Maternal Aunt   . Diabetes Brother   . Diabetes Brother    Social History   Socioeconomic History  . Marital status: Married    Spouse name: None  . Number of children: None  . Years of education: None  . Highest education level: None  Social Needs  . Financial resource strain: None  . Food insecurity - worry: None  . Food insecurity - inability: None  . Transportation needs - medical: None  . Transportation needs - non-medical: None  Occupational History  . None  Tobacco Use  . Smoking status: Never Smoker  . Smokeless tobacco: Never Used  Substance and Sexual  Activity  . Alcohol use: No    Alcohol/week: 0.0 oz  . Drug use: No  . Sexual activity: No  Other Topics Concern  . None  Social History Narrative  . None    Outpatient Encounter Medications as of 04/04/2017  Medication Sig  . aspirin 81 MG tablet Take 81 mg by mouth daily.  . brimonidine-timolol (COMBIGAN) 0.2-0.5 % ophthalmic solution 1 drop 2 (two) times daily.  . Cholecalciferol (VITAMIN D-1000 MAX ST) 1000 units tablet Take by mouth.  . colestipol (COLESTID) 1 G tablet Take 1 g by mouth as needed.  . Saccharomyces boulardii (FLORASTOR PO) Take by mouth daily as needed.  . travoprost, benzalkonium, (TRAVATAN) 0.004 % ophthalmic solution Place 1 drop into the left eye daily.  . [DISCONTINUED] OVER THE COUNTER MEDICATION Vitamin pack   No facility-administered encounter medications on file as of 04/04/2017.     Review of Systems  Constitutional: Negative for appetite change and unexpected weight change.  HENT: Negative for congestion and sinus pressure.   Respiratory: Negative for cough, chest tightness and shortness of breath.   Cardiovascular: Negative for chest pain, palpitations and leg swelling.  Gastrointestinal: Negative for abdominal pain, diarrhea and nausea.  Genitourinary: Negative for difficulty urinating and dysuria.  Musculoskeletal: Negative for back pain and joint swelling.  Neurological: Negative for dizziness, light-headedness and headaches.  Psychiatric/Behavioral: Negative for agitation and dysphoric mood.       Objective:     Blood pressure rechecked by me:  130/84  Physical Exam  Constitutional: She appears well-developed and well-nourished. No distress.  HENT:  Nose: Nose normal.  Mouth/Throat: Oropharynx is clear and moist.  Neck: Neck supple. No thyromegaly present.  Cardiovascular: Normal rate and regular rhythm.  Pulmonary/Chest: Breath sounds normal. No respiratory distress. She has no wheezes.  Abdominal: Soft. Bowel sounds are normal.  There is no tenderness.  Musculoskeletal: She exhibits no edema or tenderness.  Lymphadenopathy:    She has no cervical adenopathy.  Skin: No rash noted. No erythema.  Psychiatric: She has a normal mood and affect. Her behavior is normal.    BP 130/84   Pulse 70   Temp 98 F (36.7 C) (Oral)   Resp 17   Wt 185 lb 12.8 oz (84.3 kg)   SpO2 95%   BMI 30.92 kg/m  Wt Readings from Last 3 Encounters:  04/04/17 185 lb 12.8 oz (84.3 kg)  04/02/17 184 lb 12.8 oz (83.8 kg)  11/29/16 184 lb 6.4 oz (83.6 kg)     Lab Results  Component Value Date   WBC 5.2 04/02/2017   HGB 14.8 04/02/2017   HCT 43.3 04/02/2017   PLT 172.0 04/02/2017   GLUCOSE 128 (H) 04/02/2017   CHOL 207 (H) 04/02/2017   TRIG 116.0 04/02/2017   HDL 56.70 04/02/2017   LDLCALC 127 (H) 04/02/2017   ALT 20 04/02/2017   AST 18 04/02/2017   NA 143 04/02/2017   K 4.3 04/02/2017   CL 107 04/02/2017   CREATININE 0.67 04/02/2017   BUN 18 04/02/2017   CO2 30 04/02/2017   TSH 1.61 07/23/2016   HGBA1C 6.4 04/02/2017    Mm Digital Screening Bilateral  Result Date: 08/13/2016 CLINICAL DATA:  Screening. EXAM: DIGITAL SCREENING BILATERAL MAMMOGRAM WITH CAD COMPARISON:  Previous exam(s). ACR Breast Density Category b: There are scattered areas of fibroglandular density. FINDINGS: There are no findings suspicious for malignancy. Images were processed with CAD. IMPRESSION: No mammographic evidence of malignancy. A result letter of this screening mammogram will be mailed directly to the patient. RECOMMENDATION: Screening mammogram in one year. (Code:SM-B-01Y) BI-RADS CATEGORY  1: Negative. Electronically Signed   By: Lillia Mountain M.D.   On: 08/13/2016 10:56       Assessment & Plan:   Problem List Items Addressed This Visit    BMI 30.0-30.9,adult    Discussed diet and exercise.  Follow.        History of CVA (cerebrovascular accident)    Evaluated by Dr Manuella Ghazi.  Continue daily aspirin.       Hypercholesteremia    Low  cholesterol diet and exercise.  Discussed calculated cholesterol risk.  Discussed the need for medication.  She declines. Follow lipid panel.        Relevant Orders   Lipid panel   Hepatic function panel   Hyperglycemia    Low carb diet and exercise.  Follow met b and a1c.        Relevant Orders   TSH   Hemoglobin I4P   Basic metabolic panel   Thrombocytopenia (HCC)    Recent platelet count wnl.         Other Visit Diagnoses    Encounter for immunization  Relevant Orders   Flu vaccine HIGH DOSE PF (Completed)       Einar Pheasant, MD

## 2017-04-04 NOTE — Progress Notes (Signed)
[name], female   DOB: 1950-09-21, 66 y.o.   MRN: [id number]

## 2017-04-05 DIAGNOSIS — H401123 Primary open-angle glaucoma, left eye, severe stage: Secondary | ICD-10-CM | POA: Diagnosis not present

## 2017-04-06 NOTE — Assessment & Plan Note (Signed)
Discussed diet and exercise.  Follow.  

## 2017-04-06 NOTE — Assessment & Plan Note (Signed)
Low carb diet and exercise.  Follow met b and a1c.   

## 2017-04-06 NOTE — Assessment & Plan Note (Signed)
Low cholesterol diet and exercise.  Discussed calculated cholesterol risk.  Discussed the need for medication.  She declines. Follow lipid panel.

## 2017-04-06 NOTE — Assessment & Plan Note (Signed)
Recent platelet count wnl.  

## 2017-04-06 NOTE — Assessment & Plan Note (Signed)
Evaluated by Dr Manuella Ghazi.  Continue daily aspirin.

## 2017-07-15 ENCOUNTER — Other Ambulatory Visit: Payer: Self-pay | Admitting: Internal Medicine

## 2017-07-15 DIAGNOSIS — Z1231 Encounter for screening mammogram for malignant neoplasm of breast: Secondary | ICD-10-CM

## 2017-07-29 ENCOUNTER — Encounter: Payer: Self-pay | Admitting: Internal Medicine

## 2017-07-29 ENCOUNTER — Other Ambulatory Visit (INDEPENDENT_AMBULATORY_CARE_PROVIDER_SITE_OTHER): Payer: PPO

## 2017-07-29 DIAGNOSIS — R739 Hyperglycemia, unspecified: Secondary | ICD-10-CM | POA: Diagnosis not present

## 2017-07-29 DIAGNOSIS — E7800 Pure hypercholesterolemia, unspecified: Secondary | ICD-10-CM | POA: Diagnosis not present

## 2017-07-29 LAB — HEMOGLOBIN A1C: HEMOGLOBIN A1C: 7 % — AB (ref 4.6–6.5)

## 2017-07-29 LAB — LIPID PANEL
CHOLESTEROL: 193 mg/dL (ref 0–200)
HDL: 60 mg/dL (ref >39.00)
LDL Cholesterol: 110 mg/dL — ABNORMAL HIGH (ref 0–99)
NonHDL: 133.26
Total CHOL/HDL Ratio: 3
Triglycerides: 115 mg/dL (ref 0.0–149.0)
VLDL: 23 mg/dL (ref 0.0–40.0)

## 2017-07-29 LAB — BASIC METABOLIC PANEL
BUN: 19 mg/dL (ref 6–23)
CHLORIDE: 106 meq/L (ref 96–112)
CO2: 27 mEq/L (ref 19–32)
CREATININE: 0.66 mg/dL (ref 0.40–1.20)
Calcium: 9.7 mg/dL (ref 8.4–10.5)
GFR: 94.74 mL/min (ref >60.00)
Glucose, Bld: 138 mg/dL — ABNORMAL HIGH (ref 70–99)
Potassium: 4.2 mEq/L (ref 3.5–5.1)
Sodium: 142 mEq/L (ref 135–145)

## 2017-07-29 LAB — HEPATIC FUNCTION PANEL
ALT: 20 U/L (ref 0–35)
AST: 21 U/L (ref 0–37)
Albumin: 3.9 g/dL (ref 3.5–5.2)
Alkaline Phosphatase: 57 U/L (ref 39–117)
Bilirubin, Direct: 0.2 mg/dL (ref 0.0–0.3)
Total Bilirubin: 0.8 mg/dL (ref 0.2–1.2)
Total Protein: 6.6 g/dL (ref 6.0–8.3)

## 2017-07-29 LAB — TSH: TSH: 1.25 u[IU]/mL (ref 0.35–4.50)

## 2017-07-31 ENCOUNTER — Other Ambulatory Visit: Payer: PPO

## 2017-08-06 ENCOUNTER — Ambulatory Visit (INDEPENDENT_AMBULATORY_CARE_PROVIDER_SITE_OTHER): Payer: PPO | Admitting: Internal Medicine

## 2017-08-06 ENCOUNTER — Encounter: Payer: Self-pay | Admitting: Internal Medicine

## 2017-08-06 DIAGNOSIS — E119 Type 2 diabetes mellitus without complications: Secondary | ICD-10-CM | POA: Diagnosis not present

## 2017-08-06 DIAGNOSIS — Z6830 Body mass index (BMI) 30.0-30.9, adult: Secondary | ICD-10-CM

## 2017-08-06 DIAGNOSIS — E7800 Pure hypercholesterolemia, unspecified: Secondary | ICD-10-CM | POA: Diagnosis not present

## 2017-08-06 DIAGNOSIS — Z8673 Personal history of transient ischemic attack (TIA), and cerebral infarction without residual deficits: Secondary | ICD-10-CM | POA: Diagnosis not present

## 2017-08-06 DIAGNOSIS — Z0000 Encounter for general adult medical examination without abnormal findings: Secondary | ICD-10-CM

## 2017-08-06 DIAGNOSIS — D696 Thrombocytopenia, unspecified: Secondary | ICD-10-CM

## 2017-08-06 LAB — HM DIABETES FOOT EXAM

## 2017-08-06 NOTE — Progress Notes (Signed)
[name], female   DOB: Mar 25, 1951, 67 y.o.   MRN: [id number]   Subjective:    [name], female    DOB: 08/17/1950, 67 y.o.   MRN: [id number]  HPI  Patient with past history of CVA, hypercholesterolemia and thrombocytopenia.  She comes in today to follow up on these issues as well as for a complete physical exam.  She reports she is doing well.  Feels good.  Tries to stay active.  No chest pain.  No sob.  No acid reflux.  No abdominal pain.  Bowels moving.  Discussed labs.  Discussed elevated a1c and calculated cholesterol risk.  She wants to work on diet and exercise.  Wants to hold on medication.     Past Medical History:  Diagnosis Date  . Diverticulosis   . Hypercholesterolemia   . Serrated adenoma of colon   . Stroke (cerebrum) ([place])   . Thrombocytopenia ([place])   . Uterus descensus    Past Surgical History:  Procedure Laterality Date  . ABDOMINAL HYSTERECTOMY  2009  . CHOLECYSTECTOMY  2007  . COLONOSCOPY N/A 04/05/2015   Procedure: COLONOSCOPY;  Surgeon: Lollie Sails, MD;  Location: [facility] ENDOSCOPY;  Service: Endoscopy;  Laterality: N/A;  . COLONOSCOPY WITH PROPOFOL N/A 10/09/2016   Procedure: COLONOSCOPY WITH PROPOFOL;  Surgeon: Lollie Sails, MD;  Location: [facility] ENDOSCOPY;  Service: Endoscopy;  Laterality: N/A;  . DILATION AND CURETTAGE OF UTERUS  2001 and 2006  . FOOT SURGERY  2007  . TONSILLECTOMY  1960  . TUBAL LIGATION    . vocal cord cyst removed     Family History  Problem Relation Age of Onset  . Diabetes Unknown   . Colon cancer Mother   . Diabetes Mother   . Kidney disease Mother   . Heart disease Mother   . Stroke Paternal Grandmother   . Stroke Paternal Grandfather   . Prostate cancer Father   . Diabetes Father   . Breast cancer Paternal Aunt        x2  . Uterine cancer Maternal Aunt   . Diabetes Brother   . Diabetes Brother    Social History   Socioeconomic History  . Marital status:  Married    Spouse name: None  . Number of children: None  . Years of education: None  . Highest education level: None  Social Needs  . Financial resource strain: None  . Food insecurity - worry: None  . Food insecurity - inability: None  . Transportation needs - medical: None  . Transportation needs - non-medical: None  Occupational History  . None  Tobacco Use  . Smoking status: Never Smoker  . Smokeless tobacco: Never Used  Substance and Sexual Activity  . Alcohol use: No    Alcohol/week: 0.0 oz  . Drug use: No  . Sexual activity: No  Other Topics Concern  . None  Social History Narrative  . None    Outpatient Encounter Medications as of 08/06/2017  Medication Sig  . aspirin 81 MG tablet Take 81 mg by mouth daily.  . Cholecalciferol (VITAMIN D-1000 MAX ST) 1000 units tablet Take by mouth.  . colestipol (COLESTID) 1 G tablet Take 1 g by mouth as needed.  . dorzolamide-timolol (COSOPT) 22.3-6.8 MG/ML ophthalmic solution Place 1 drop into both eyes 2 (two) times daily.  . Saccharomyces boulardii (FLORASTOR PO) Take by mouth daily as needed.  . travoprost, benzalkonium, (TRAVATAN) 0.004 % ophthalmic solution Place 1  drop into both eyes daily.   . [DISCONTINUED] brimonidine-timolol (COMBIGAN) 0.2-0.5 % ophthalmic solution 1 drop 2 (two) times daily.   No facility-administered encounter medications on file as of 08/06/2017.     Review of Systems  Constitutional: Negative for appetite change and unexpected weight change.  HENT: Negative for congestion and sinus pressure.   Eyes: Negative for pain and visual disturbance.  Respiratory: Negative for cough, chest tightness and shortness of breath.   Cardiovascular: Negative for chest pain, palpitations and leg swelling.  Gastrointestinal: Negative for abdominal pain, diarrhea, nausea and vomiting.  Genitourinary: Negative for difficulty urinating and dysuria.  Musculoskeletal: Negative for joint swelling and myalgias.  Skin:  Negative for color change and rash.  Neurological: Negative for dizziness, light-headedness and headaches.  Hematological: Negative for adenopathy. Does not bruise/bleed easily.  Psychiatric/Behavioral: Negative for agitation and dysphoric mood.       Objective:     Blood pressure rechecked by me:  122/78  Physical Exam  Constitutional: She is oriented to person, place, and time. She appears well-developed and well-nourished. No distress.  HENT:  Nose: Nose normal.  Mouth/Throat: Oropharynx is clear and moist.  Eyes: Right eye exhibits no discharge. Left eye exhibits no discharge. No scleral icterus.  Neck: Neck supple. No thyromegaly present.  Cardiovascular: Normal rate and regular rhythm.  Pulmonary/Chest: Breath sounds normal. No accessory muscle usage. No tachypnea. No respiratory distress. She has no decreased breath sounds. She has no wheezes. She has no rhonchi. Right breast exhibits no inverted nipple, no mass, no nipple discharge and no tenderness (no axillary adenopathy). Left breast exhibits no inverted nipple, no mass, no nipple discharge and no tenderness (no axilarry adenopathy).  Abdominal: Soft. Bowel sounds are normal. There is no tenderness.  Musculoskeletal: She exhibits no edema or tenderness.  Lymphadenopathy:    She has no cervical adenopathy.  Neurological: She is alert and oriented to person, place, and time.  Skin: Skin is warm. No rash noted. No erythema.  Psychiatric: She has a normal mood and affect. Her behavior is normal.    BP 126/82 (BP Location: Left Arm, Patient Position: Sitting, Cuff Size: Normal)   Pulse (!) 59   Temp 97.9 F (36.6 C) (Oral)   Resp 18   Wt 185 lb 6.4 oz (84.1 kg)   SpO2 98%   BMI 30.85 kg/m  Wt Readings from Last 3 Encounters:  08/06/17 185 lb 6.4 oz (84.1 kg)  04/04/17 185 lb 12.8 oz (84.3 kg)  04/02/17 184 lb 12.8 oz (83.8 kg)     Lab Results  Component Value Date   WBC 5.2 04/02/2017   HGB 14.8 04/02/2017   HCT  43.3 04/02/2017   PLT 172.0 04/02/2017   GLUCOSE 138 (H) 07/29/2017   CHOL 193 07/29/2017   TRIG 115.0 07/29/2017   HDL 60.00 07/29/2017   LDLCALC 110 (H) 07/29/2017   ALT 20 07/29/2017   AST 21 07/29/2017   NA 142 07/29/2017   K 4.2 07/29/2017   CL 106 07/29/2017   CREATININE 0.66 07/29/2017   BUN 19 07/29/2017   CO2 27 07/29/2017   TSH 1.25 07/29/2017   HGBA1C 7.0 (H) 07/29/2017    Mm Digital Screening Bilateral  Result Date: 08/13/2016 CLINICAL DATA:  Screening. EXAM: DIGITAL SCREENING BILATERAL MAMMOGRAM WITH CAD COMPARISON:  Previous exam(s). ACR Breast Density Category b: There are scattered areas of fibroglandular density. FINDINGS: There are no findings suspicious for malignancy. Images were processed with CAD. IMPRESSION: No mammographic evidence of  malignancy. A result letter of this screening mammogram will be mailed directly to the patient. RECOMMENDATION: Screening mammogram in one year. (Code:SM-B-01Y) BI-RADS CATEGORY  1: Negative. Electronically Signed   By: Lillia Mountain M.D.   On: 08/13/2016 10:56       Assessment & Plan:   Problem List Items Addressed This Visit    BMI 30.0-30.9,adult    Discussed diet and exercise.  Follow.        Diabetes mellitus without complication (HCC)    Low carb diet and exercise.  Follow met b and a1c. Discussed starting medicaiton.  She declines.  Wants to work on diet and exercise.        Relevant Orders   Hemoglobin Z5U   Basic metabolic panel   Microalbumin / creatinine urine ratio   Health care maintenance    Physical today 08/09/17.  Mammogram 08/13/16 - Birads I.  Scheduled for f/u mammogram.  Colonoscopy 10/09/16 - multiple polyps.  Recommended f/u colonoscopy in 3 years.        History of CVA (cerebrovascular accident)    Continue daily aspirin.  Was previously evaluated by Dr Manuella Ghazi.        Hypercholesteremia    She declines cholesterol medication.  Low cholesterol diet and exercise.  Follow lipid panel.         Relevant Orders   Hepatic function panel   Lipid panel   Thrombocytopenia (HCC)    Follow platelet count.            Einar Pheasant, MD

## 2017-08-10 ENCOUNTER — Encounter: Payer: Self-pay | Admitting: Internal Medicine

## 2017-08-10 NOTE — Assessment & Plan Note (Signed)
Continue daily aspirin.  Was previously evaluated by Dr Manuella Ghazi.

## 2017-08-10 NOTE — Assessment & Plan Note (Signed)
Follow platelet count.

## 2017-08-10 NOTE — Assessment & Plan Note (Signed)
Discussed diet and exercise.  Follow.  

## 2017-08-10 NOTE — Assessment & Plan Note (Signed)
She declines cholesterol medication.  Low cholesterol diet and exercise.  Follow lipid panel.   

## 2017-08-10 NOTE — Assessment & Plan Note (Addendum)
Physical today 08/09/17.  Mammogram 08/13/16 - Birads I.  Scheduled for f/u mammogram.  Colonoscopy 10/09/16 - multiple polyps.  Recommended f/u colonoscopy in 3 years.

## 2017-08-10 NOTE — Assessment & Plan Note (Addendum)
Low carb diet and exercise.  Follow met b and a1c. Discussed starting medicaiton.  She declines.  Wants to work on diet and exercise.

## 2017-08-19 ENCOUNTER — Ambulatory Visit
Admission: RE | Admit: 2017-08-19 | Discharge: 2017-08-19 | Disposition: A | Discharge status: Home / Self Care | Payer: PPO | Source: Ambulatory Visit | Attending: Internal Medicine | Admitting: Internal Medicine

## 2017-08-19 DIAGNOSIS — Z1231 Encounter for screening mammogram for malignant neoplasm of breast: Secondary | ICD-10-CM

## 2017-08-26 ENCOUNTER — Encounter: Payer: Self-pay | Admitting: Internal Medicine

## 2017-08-26 ENCOUNTER — Ambulatory Visit (INDEPENDENT_AMBULATORY_CARE_PROVIDER_SITE_OTHER): Payer: PPO | Admitting: Internal Medicine

## 2017-08-26 DIAGNOSIS — R05 Cough: Secondary | ICD-10-CM | POA: Diagnosis not present

## 2017-08-26 DIAGNOSIS — E119 Type 2 diabetes mellitus without complications: Secondary | ICD-10-CM | POA: Diagnosis not present

## 2017-08-26 DIAGNOSIS — R059 Cough, unspecified: Secondary | ICD-10-CM

## 2017-08-26 MED ORDER — PREDNISONE 10 MG PO TABS: ORAL_TABLET | ORAL | 0 refills | Status: DC

## 2017-08-26 MED ORDER — DOXYCYCLINE HYCLATE 100 MG PO TABS: 100.0000 mg | ORAL_TABLET | Freq: Two times a day (BID) | ORAL | 0 refills | Status: DC

## 2017-08-26 NOTE — Progress Notes (Signed)
Pre-visit discussion using our clinic review tool. No additional management support is needed unless otherwise documented below in the visit note.  

## 2017-08-26 NOTE — Patient Instructions (Signed)
Saline nasal spray - flush nose at least 2-3x/day  nasacort nasal spray - 2 sprays each nostril one time per day.  Do this in the evening.    robutissin twice a day as needed for cough and congestion.

## 2017-08-26 NOTE — Progress Notes (Signed)
[name], female   DOB: 10-16-1950, 67 y.o.   MRN: [id number]   Subjective:    [name], female    DOB: 24-Oct-1950, 67 y.o.   MRN: [id number]  HPI  Patient here as a work in with concerns regarding increased cough and congestion.  Pt reports that symptoms started over one week ago.  Reports hoarseness.  Increased cough.  Sore throat.  No significant sinus pressure.  Question of drainage.  Some colored mucus production.  Productive of green mucus.  No sob.  No vomiting.  Eating.  Taking delsym at night.  Using zycam.  Taking cough drops.     Past Medical History:  Diagnosis Date  . Diverticulosis   . Hypercholesterolemia   . Serrated adenoma of colon   . Stroke (cerebrum) ([place])   . Thrombocytopenia ([place])   . Uterus descensus    Past Surgical History:  Procedure Laterality Date  . ABDOMINAL HYSTERECTOMY  2009  . CHOLECYSTECTOMY  2007  . COLONOSCOPY N/A 04/05/2015   Procedure: COLONOSCOPY;  Surgeon: Lollie Sails, MD;  Location: [facility] ENDOSCOPY;  Service: Endoscopy;  Laterality: N/A;  . COLONOSCOPY WITH PROPOFOL N/A 10/09/2016   Procedure: COLONOSCOPY WITH PROPOFOL;  Surgeon: Lollie Sails, MD;  Location: [facility] ENDOSCOPY;  Service: Endoscopy;  Laterality: N/A;  . DILATION AND CURETTAGE OF UTERUS  2001 and 2006  . FOOT SURGERY  2007  . TONSILLECTOMY  1960  . TUBAL LIGATION    . vocal cord cyst removed     Family History  Problem Relation Age of Onset  . Diabetes Unknown   . Colon cancer Mother   . Diabetes Mother   . Kidney disease Mother   . Heart disease Mother   . Stroke Paternal Grandmother   . Stroke Paternal Grandfather   . Prostate cancer Father   . Diabetes Father   . Breast cancer Paternal Aunt        x2  . Uterine cancer Maternal Aunt   . Diabetes Brother   . Diabetes Brother    Social History   Socioeconomic History  . Marital status: Married    Spouse name: Not on file  . Number of children: Not on  file  . Years of education: Not on file  . Highest education level: Not on file  Occupational History  . Not on file  Social Needs  . Financial resource strain: Not on file  . Food insecurity:    Worry: Not on file    Inability: Not on file  . Transportation needs:    Medical: Not on file    Non-medical: Not on file  Tobacco Use  . Smoking status: Never Smoker  . Smokeless tobacco: Never Used  Substance and Sexual Activity  . Alcohol use: No    Alcohol/week: 0.0 oz  . Drug use: No  . Sexual activity: Never  Lifestyle  . Physical activity:    Days per week: Not on file    Minutes per session: Not on file  . Stress: Not on file  Relationships  . Social connections:    Talks on phone: Not on file    Gets together: Not on file    Attends religious service: Not on file    Active member of club or organization: Not on file    Attends meetings of clubs or organizations: Not on file    Relationship status: Not on file  Other Topics Concern  . Not on  file  Social History Narrative  . Not on file    Outpatient Encounter Medications as of 08/26/2017  Medication Sig  . aspirin 81 MG tablet Take 81 mg by mouth daily.  . Cholecalciferol (VITAMIN D-1000 MAX ST) 1000 units tablet Take by mouth.  . colestipol (COLESTID) 1 G tablet Take 1 g by mouth as needed.  . dorzolamide-timolol (COSOPT) 22.3-6.8 MG/ML ophthalmic solution Place 1 drop into both eyes 2 (two) times daily.  . Saccharomyces boulardii (FLORASTOR PO) Take by mouth daily as needed.  . travoprost, benzalkonium, (TRAVATAN) 0.004 % ophthalmic solution Place 1 drop into both eyes daily.   Marland Kitchen doxycycline (VIBRA-TABS) 100 MG tablet Take 1 tablet (100 mg total) by mouth 2 (two) times daily.  . predniSONE (DELTASONE) 10 MG tablet Take 4 tablets x 1 day and then decrease by 1/2 tablet per day until down to 50m.   No facility-administered encounter medications on file as of 08/26/2017.     Review of Systems  Constitutional:  Negative for appetite change and fever.  HENT: Positive for congestion and sore throat. Negative for sinus pressure.   Respiratory: Positive for cough. Negative for chest tightness and shortness of breath.   Cardiovascular: Negative for chest pain, palpitations and leg swelling.  Gastrointestinal: Negative for abdominal pain, diarrhea and nausea.  Musculoskeletal: Negative for joint swelling and myalgias.  Skin: Negative for color change and rash.  Neurological: Negative for dizziness, light-headedness and headaches.  Psychiatric/Behavioral: Negative for agitation and dysphoric mood.       Objective:    Physical Exam  Constitutional: She appears well-developed and well-nourished. No distress.  HENT:  Nose: Nose normal.  Mouth/Throat: Oropharynx is clear and moist.  Neck: Neck supple.  Cardiovascular: Normal rate and regular rhythm.  Pulmonary/Chest: Breath sounds normal. No respiratory distress.  Increased cough with forced expiration.    Musculoskeletal: She exhibits no edema or tenderness.  Lymphadenopathy:    She has no cervical adenopathy.  Skin: No rash noted. No erythema.  Psychiatric: Her behavior is normal.    BP 120/82 (BP Location: Left Arm, Patient Position: Sitting, Cuff Size: Normal)   Pulse 68   Temp 98.1 F (36.7 C) (Oral)   Resp 18   Ht 5' 5"  (1.651 m)   Wt 181 lb 2 oz (82.2 kg)   SpO2 93%   BMI 30.14 kg/m  Wt Readings from Last 3 Encounters:  08/26/17 181 lb 2 oz (82.2 kg)  08/06/17 185 lb 6.4 oz (84.1 kg)  04/04/17 185 lb 12.8 oz (84.3 kg)     Lab Results  Component Value Date   WBC 5.2 04/02/2017   HGB 14.8 04/02/2017   HCT 43.3 04/02/2017   PLT 172.0 04/02/2017   GLUCOSE 138 (H) 07/29/2017   CHOL 193 07/29/2017   TRIG 115.0 07/29/2017   HDL 60.00 07/29/2017   LDLCALC 110 (H) 07/29/2017   ALT 20 07/29/2017   AST 21 07/29/2017   NA 142 07/29/2017   K 4.2 07/29/2017   CL 106 07/29/2017   CREATININE 0.66 07/29/2017   BUN 19 07/29/2017    CO2 27 07/29/2017   TSH 1.25 07/29/2017   HGBA1C 7.0 (H) 07/29/2017    Mm Screening Breast Tomo Bilateral  Result Date: 08/19/2017 CLINICAL DATA:  Screening. EXAM: DIGITAL SCREENING BILATERAL MAMMOGRAM WITH TOMO AND CAD COMPARISON:  Previous exam(s). ACR Breast Density Category b: There are scattered areas of fibroglandular density. FINDINGS: There are no findings suspicious for malignancy. Images were processed with CAD. IMPRESSION:  No mammographic evidence of malignancy. A result letter of this screening mammogram will be mailed directly to the patient. RECOMMENDATION: Screening mammogram in one year. (Code:SM-B-01Y) BI-RADS CATEGORY  1: Negative. Electronically Signed   By: Curlene Dolphin M.D.   On: 08/19/2017 16:22       Assessment & Plan:   Problem List Items Addressed This Visit    Cough    Increased cough and congestion as outlined.  Persistent/worsening symptoms.  Concern over URI/bronchitis.  Treat with saline nasal spray and nasacort nasal spray as outlined.  Robitussin DM as directed.  Prednisone taper and doxycycline.  Take a probiotic daily while on the abx and for two weeks after completing the abx.        Diabetes mellitus without complication (HCC)    Low carb diet and exercise.  Follow met b and a1c.            Einar Pheasant, MD

## 2017-09-02 DIAGNOSIS — R05 Cough: Secondary | ICD-10-CM | POA: Insufficient documentation

## 2017-09-02 DIAGNOSIS — R059 Cough, unspecified: Secondary | ICD-10-CM | POA: Insufficient documentation

## 2017-09-02 DIAGNOSIS — J069 Acute upper respiratory infection, unspecified: Secondary | ICD-10-CM | POA: Insufficient documentation

## 2017-09-02 NOTE — Assessment & Plan Note (Signed)
Low carb diet and exercise.  Follow met b and a1c.   

## 2017-09-02 NOTE — Assessment & Plan Note (Signed)
Increased cough and congestion as outlined.  Persistent/worsening symptoms.  Concern over URI/bronchitis.  Treat with saline nasal spray and nasacort nasal spray as outlined.  Robitussin DM as directed.  Prednisone taper and doxycycline.  Take a probiotic daily while on the abx and for two weeks after completing the abx.

## 2017-09-12 ENCOUNTER — Ambulatory Visit: Payer: PPO | Admitting: Family Medicine

## 2017-10-03 DIAGNOSIS — K529 Noninfective gastroenteritis and colitis, unspecified: Secondary | ICD-10-CM | POA: Diagnosis not present

## 2017-10-03 DIAGNOSIS — H401122 Primary open-angle glaucoma, left eye, moderate stage: Secondary | ICD-10-CM | POA: Diagnosis not present

## 2017-10-03 DIAGNOSIS — D369 Benign neoplasm, unspecified site: Secondary | ICD-10-CM | POA: Diagnosis not present

## 2017-12-17 ENCOUNTER — Other Ambulatory Visit (INDEPENDENT_AMBULATORY_CARE_PROVIDER_SITE_OTHER): Payer: PPO

## 2017-12-17 DIAGNOSIS — E119 Type 2 diabetes mellitus without complications: Secondary | ICD-10-CM | POA: Diagnosis not present

## 2017-12-17 DIAGNOSIS — E7800 Pure hypercholesterolemia, unspecified: Secondary | ICD-10-CM

## 2017-12-17 LAB — HEPATIC FUNCTION PANEL
ALT: 30 U/L (ref 0–35)
AST: 25 U/L (ref 0–37)
Albumin: 4.3 g/dL (ref 3.5–5.2)
Alkaline Phosphatase: 55 U/L (ref 39–117)
BILIRUBIN DIRECT: 0.1 mg/dL (ref 0.0–0.3)
BILIRUBIN TOTAL: 1 mg/dL (ref 0.2–1.2)
Total Protein: 6.9 g/dL (ref 6.0–8.3)

## 2017-12-17 LAB — BASIC METABOLIC PANEL
BUN: 17 mg/dL (ref 6–23)
CHLORIDE: 106 meq/L (ref 96–112)
CO2: 27 mEq/L (ref 19–32)
CREATININE: 0.75 mg/dL (ref 0.40–1.20)
Calcium: 9.4 mg/dL (ref 8.4–10.5)
GFR: 81.65 mL/min (ref >60.00)
Glucose, Bld: 134 mg/dL — ABNORMAL HIGH (ref 70–99)
Potassium: 4.2 mEq/L (ref 3.5–5.1)
Sodium: 141 mEq/L (ref 135–145)

## 2017-12-17 LAB — LIPID PANEL
CHOL/HDL RATIO: 3
Cholesterol: 207 mg/dL — ABNORMAL HIGH (ref 0–200)
HDL: 61 mg/dL (ref >39.00)
LDL CALC: 116 mg/dL — AB (ref 0–99)
NONHDL: 146.15
Triglycerides: 152 mg/dL — ABNORMAL HIGH (ref 0.0–149.0)
VLDL: 30.4 mg/dL (ref 0.0–40.0)

## 2017-12-17 LAB — MICROALBUMIN / CREATININE URINE RATIO
CREATININE, U: 58.1 mg/dL
Microalb Creat Ratio: 1.2 mg/g (ref 0.0–30.0)
Microalb, Ur: <0.7 mg/dL (ref 0.0–1.9)

## 2017-12-17 LAB — HEMOGLOBIN A1C: Hgb A1c MFr Bld: 6.7 % — ABNORMAL HIGH (ref 4.6–6.5)

## 2017-12-18 ENCOUNTER — Encounter: Payer: Self-pay | Admitting: Internal Medicine

## 2017-12-19 ENCOUNTER — Ambulatory Visit (INDEPENDENT_AMBULATORY_CARE_PROVIDER_SITE_OTHER): Payer: PPO | Admitting: Internal Medicine

## 2017-12-19 ENCOUNTER — Encounter: Payer: Self-pay | Admitting: Internal Medicine

## 2017-12-19 DIAGNOSIS — E119 Type 2 diabetes mellitus without complications: Secondary | ICD-10-CM

## 2017-12-19 DIAGNOSIS — D696 Thrombocytopenia, unspecified: Secondary | ICD-10-CM

## 2017-12-19 DIAGNOSIS — Z8673 Personal history of transient ischemic attack (TIA), and cerebral infarction without residual deficits: Secondary | ICD-10-CM | POA: Diagnosis not present

## 2017-12-19 DIAGNOSIS — E7800 Pure hypercholesterolemia, unspecified: Secondary | ICD-10-CM

## 2017-12-19 NOTE — Progress Notes (Signed)
[name], female   DOB: 1951/05/22, 67 y.o.   MRN: [id number]   Subjective:    [name], female    DOB: 04-05-1951, 67 y.o.   MRN: [id number]  HPI  Patient here for a scheduled follow up.  Sees GI for some chronic diarrhea.  Is doing better on colestipol and protbotics. She tries to stay active.  No chest pain.  No sob.  No acid reflux.  No abdominal pain.  Bowels moving.  Does report some issues with her right hip.  Desires no further w/up.  Wants to monitor.      Past Medical History:  Diagnosis Date  . Diverticulosis   . Hypercholesterolemia   . Serrated adenoma of colon   . Stroke (cerebrum) ([place])   . Thrombocytopenia ([place])   . Uterus descensus    Past Surgical History:  Procedure Laterality Date  . ABDOMINAL HYSTERECTOMY  2009  . CHOLECYSTECTOMY  2007  . COLONOSCOPY N/A 04/05/2015   Procedure: COLONOSCOPY;  Surgeon: Lollie Sails, MD;  Location: [facility] ENDOSCOPY;  Service: Endoscopy;  Laterality: N/A;  . COLONOSCOPY WITH PROPOFOL N/A 10/09/2016   Procedure: COLONOSCOPY WITH PROPOFOL;  Surgeon: Lollie Sails, MD;  Location: [facility] ENDOSCOPY;  Service: Endoscopy;  Laterality: N/A;  . DILATION AND CURETTAGE OF UTERUS  2001 and 2006  . FOOT SURGERY  2007  . TONSILLECTOMY  1960  . TUBAL LIGATION    . vocal cord cyst removed     Family History  Problem Relation Age of Onset  . Diabetes Unknown   . Colon cancer Mother   . Diabetes Mother   . Kidney disease Mother   . Heart disease Mother   . Stroke Paternal Grandmother   . Stroke Paternal Grandfather   . Prostate cancer Father   . Diabetes Father   . Breast cancer Paternal Aunt        x2  . Uterine cancer Maternal Aunt   . Diabetes Brother   . Diabetes Brother    Social History   Socioeconomic History  . Marital status: Married    Spouse name: Not on file  . Number of children: Not on file  . Years of education: Not on file  . Highest education level: Not on  file  Occupational History  . Not on file  Social Needs  . Financial resource strain: Not on file  . Food insecurity:    Worry: Not on file    Inability: Not on file  . Transportation needs:    Medical: Not on file    Non-medical: Not on file  Tobacco Use  . Smoking status: Never Smoker  . Smokeless tobacco: Never Used  Substance and Sexual Activity  . Alcohol use: No    Alcohol/week: 0.0 oz  . Drug use: No  . Sexual activity: Never  Lifestyle  . Physical activity:    Days per week: Not on file    Minutes per session: Not on file  . Stress: Not on file  Relationships  . Social connections:    Talks on phone: Not on file    Gets together: Not on file    Attends religious service: Not on file    Active member of club or organization: Not on file    Attends meetings of clubs or organizations: Not on file    Relationship status: Not on file  Other Topics Concern  . Not on file  Social History Narrative  .  Not on file    Outpatient Encounter Medications as of 12/19/2017  Medication Sig  . aspirin 81 MG tablet Take 81 mg by mouth daily.  . Cholecalciferol (VITAMIN D-1000 MAX ST) 1000 units tablet Take by mouth.  . colestipol (COLESTID) 1 G tablet Take 1 g by mouth as needed.  . dorzolamide-timolol (COSOPT) 22.3-6.8 MG/ML ophthalmic solution Place 1 drop into both eyes 2 (two) times daily.  . Saccharomyces boulardii (FLORASTOR PO) Take by mouth daily as needed.  . travoprost, benzalkonium, (TRAVATAN) 0.004 % ophthalmic solution Place 1 drop into both eyes daily.   . [DISCONTINUED] doxycycline (VIBRA-TABS) 100 MG tablet Take 1 tablet (100 mg total) by mouth 2 (two) times daily.  . [DISCONTINUED] predniSONE (DELTASONE) 10 MG tablet Take 4 tablets x 1 day and then decrease by 1/2 tablet per day until down to 36m.   No facility-administered encounter medications on file as of 12/19/2017.     Review of Systems  Constitutional: Negative for appetite change and unexpected weight  change.  HENT: Negative for congestion and sinus pressure.   Respiratory: Negative for cough, chest tightness and shortness of breath.   Cardiovascular: Negative for chest pain, palpitations and leg swelling.  Gastrointestinal: Negative for abdominal pain, diarrhea, nausea and vomiting.  Genitourinary: Negative for difficulty urinating and dysuria.  Musculoskeletal: Negative for joint swelling and myalgias.  Skin: Negative for color change and rash.  Neurological: Negative for dizziness, light-headedness and headaches.  Psychiatric/Behavioral: Negative for agitation and dysphoric mood.       Objective:     Blood pressure rechecked by me:  124/82  Physical Exam  Constitutional: She appears well-developed and well-nourished. No distress.  HENT:  Nose: Nose normal.  Mouth/Throat: Oropharynx is clear and moist.  Neck: Neck supple. No thyromegaly present.  Cardiovascular: Normal rate and regular rhythm.  Pulmonary/Chest: Breath sounds normal. No respiratory distress. She has no wheezes.  Abdominal: Soft. Bowel sounds are normal. There is no tenderness.  Musculoskeletal: She exhibits no edema or tenderness.  Lymphadenopathy:    She has no cervical adenopathy.  Skin: No rash noted. No erythema.  Psychiatric: She has a normal mood and affect. Her behavior is normal.    BP 128/80 (BP Location: Left Arm, Patient Position: Sitting, Cuff Size: Normal)   Pulse 60   Temp 97.8 F (36.6 C) (Oral)   Resp 18   Wt 184 lb (83.5 kg)   SpO2 97%   BMI 30.62 kg/m  Wt Readings from Last 3 Encounters:  12/19/17 184 lb (83.5 kg)  08/26/17 181 lb 2 oz (82.2 kg)  08/06/17 185 lb 6.4 oz (84.1 kg)     Lab Results  Component Value Date   WBC 5.2 04/02/2017   HGB 14.8 04/02/2017   HCT 43.3 04/02/2017   PLT 172.0 04/02/2017   GLUCOSE 134 (H) 12/17/2017   CHOL 207 (H) 12/17/2017   TRIG 152.0 (H) 12/17/2017   HDL 61.00 12/17/2017   LDLCALC 116 (H) 12/17/2017   ALT 30 12/17/2017   AST 25  12/17/2017   NA 141 12/17/2017   K 4.2 12/17/2017   CL 106 12/17/2017   CREATININE 0.75 12/17/2017   BUN 17 12/17/2017   CO2 27 12/17/2017   TSH 1.25 07/29/2017   HGBA1C 6.7 (H) 12/17/2017   MICROALBUR <0.7 12/17/2017    Mm Screening Breast Tomo Bilateral  Result Date: 08/19/2017 CLINICAL DATA:  Screening. EXAM: DIGITAL SCREENING BILATERAL MAMMOGRAM WITH TOMO AND CAD COMPARISON:  Previous exam(s). ACR Breast Density Category  b: There are scattered areas of fibroglandular density. FINDINGS: There are no findings suspicious for malignancy. Images were processed with CAD. IMPRESSION: No mammographic evidence of malignancy. A result letter of this screening mammogram will be mailed directly to the patient. RECOMMENDATION: Screening mammogram in one year. (Code:SM-B-01Y) BI-RADS CATEGORY  1: Negative. Electronically Signed   By: Curlene Dolphin M.D.   On: 08/19/2017 16:22       Assessment & Plan:   Problem List Items Addressed This Visit    Diabetes mellitus without complication ([place])    Low carb diet and exercise.  Follow met b and a1c.        Relevant Orders   Hemoglobin A1c   History of CVA (cerebrovascular accident)    Continue daily aspirin.  Previously evaluated by Dr Manuella Ghazi.        Hypercholesteremia    She declines cholesterol medication.  Discussed again with her today regarding calculated cholesterol risk and need for cholesterol medication.  She continues to decline.  Low cholesterol diet and exercise.  Follow lipid panel.       Relevant Orders   Hepatic function panel   Lipid panel   Basic metabolic panel   Thrombocytopenia (HCC)    Last cbc - platelet count wnl.  Follow cbc.        Relevant Orders   CBC with Differential/Platelet       Einar Pheasant, MD

## 2017-12-21 ENCOUNTER — Encounter: Payer: Self-pay | Admitting: Internal Medicine

## 2017-12-21 NOTE — Assessment & Plan Note (Signed)
Last cbc - platelet count wnl.  Follow cbc.

## 2017-12-21 NOTE — Assessment & Plan Note (Signed)
She declines cholesterol medication.  Discussed again with her today regarding calculated cholesterol risk and need for cholesterol medication.  She continues to decline.  Low cholesterol diet and exercise.  Follow lipid panel.

## 2017-12-21 NOTE — Assessment & Plan Note (Signed)
Continue daily aspirin.  Previously evaluated by Dr Manuella Ghazi.

## 2017-12-21 NOTE — Assessment & Plan Note (Signed)
Low carb diet and exercise.  Follow met b and a1c.   

## 2018-01-16 ENCOUNTER — Ambulatory Visit (INDEPENDENT_AMBULATORY_CARE_PROVIDER_SITE_OTHER): Payer: PPO | Admitting: Family Medicine

## 2018-01-16 ENCOUNTER — Ambulatory Visit (INDEPENDENT_AMBULATORY_CARE_PROVIDER_SITE_OTHER): Payer: PPO

## 2018-01-16 ENCOUNTER — Encounter: Payer: Self-pay | Admitting: Family Medicine

## 2018-01-16 VITALS — BP 120/86 | HR 66 | Temp 98.3°F | Resp 16 | Wt 184.2 lb

## 2018-01-16 DIAGNOSIS — M160 Bilateral primary osteoarthritis of hip: Secondary | ICD-10-CM | POA: Diagnosis not present

## 2018-01-16 DIAGNOSIS — M25551 Pain in right hip: Secondary | ICD-10-CM

## 2018-01-16 MED ORDER — MELOXICAM 7.5 MG PO TABS
7.5000 mg | ORAL_TABLET | Freq: Every day | ORAL | 1 refills | Status: DC
Start: 2018-01-16 — End: 2018-04-16

## 2018-01-16 NOTE — Patient Instructions (Signed)
Great to meet you!  Meloxicam once daily, amy take additional tylenol as needed with this medication. But NO IBUPROFEN with the meloxicam.

## 2018-01-16 NOTE — Progress Notes (Signed)
Subjective:    [name], female    DOB: [birth year], 67 y.o.   MRN: [id number]  HPI   Patient presents to clinic due to right hip/upper leg pain.  States she has had this pain off and on for the past few weeks, but initially was not concerned.  Has been using Tylenol and ibuprofen, Biofreeze rub as needed with some effect.  There will be times where she will turn or change position just right & the pain will catch in shoot into hip and down right leg.    Patient is going on a church camp trip next week and will have to be very active for this trip, so would like something to help pain.  Denies any injury or fall.  Denies any numbness or tingling in lower extremities.  Denies any low back pain.  Denies any history of low back injury.  Patient Active Problem List   Diagnosis Date Noted  . Cough 09/02/2017  . Glaucoma 03/25/2016  . Diabetes mellitus without complication ([place]) 45/80/9983  . History of colonic polyps 04/07/2015  . History of CVA (cerebrovascular accident) 08/02/2014  . Stress 08/02/2014  . Plantar fasciitis 07/05/2014  . BMI 30.0-30.9,adult 07/05/2014  . Health care maintenance 07/05/2014  . Breast pain, right 05/25/2014  . Pain of left heel 01/03/2014  . Foot fracture, left 07/07/2013  . Thrombocytopenia ([place]) 03/26/2012  . Hypercholesteremia 03/26/2012   Social History   Tobacco Use  . Smoking status: Never Smoker  . Smokeless tobacco: Never Used  Substance Use Topics  . Alcohol use: No    Alcohol/week: 0.0 standard drinks   Review of Systems  Constitutional: Negative for chills, fatigue and fever.  HENT: Negative for congestion, ear pain, sinus pain and sore throat.   Eyes: Negative.   Respiratory: Negative for cough, shortness of breath and wheezing.   Cardiovascular: Negative for chest pain, palpitations and leg swelling.  Gastrointestinal: Negative for abdominal pain, diarrhea, nausea and vomiting.  Genitourinary: Negative for  dysuria, frequency and urgency.  Musculoskeletal: +right hip pain  Skin: Negative for color change, pallor and rash.  Neurological: Negative for syncope, light-headedness and headaches.  Psychiatric/Behavioral: The patient is not nervous/anxious.       Objective:   Physical Exam  Constitutional: She is oriented to person, place, and time. She appears well-developed and well-nourished. No distress.  Cardiovascular: Normal rate and regular rhythm.  Pulmonary/Chest: Effort normal and breath sounds normal. No respiratory distress.  Musculoskeletal: She exhibits no edema, tenderness or deformity.  No pain with abduction or abduction of right leg at hip joint.  No pain when sitting and hip at 90 degrees angle.  No pain when the right leg raised up past 90 degrees angle.  No pain with walking.  Some pain when patient moves right leg to pivot and change direction while walking.  Neurological: She is alert and oriented to person, place, and time.  Nursing note and vitals reviewed.  Vitals:   01/16/18 1358  BP: 120/86  Pulse: 66  Resp: 16  Temp: 98.3 F (36.8 C)  SpO2: 96%      Assessment & Plan:    Right hip pain --  I suspect right hip pain is related to arthritis.  Patient will take low-dose meloxicam 7.5 mg once daily, and has been advised she may take Tylenol as needed on top of this medication.  She is also aware not to take any additional ibuprofen while taking meloxicam.  Also  advised she may continue to use topical Biofreeze rub.  We will call patient with x-ray results when available.  Keep appointment as scheduled on 04/18/2018 for regular follow-up

## 2018-02-26 DIAGNOSIS — L570 Actinic keratosis: Secondary | ICD-10-CM | POA: Diagnosis not present

## 2018-02-26 DIAGNOSIS — D485 Neoplasm of uncertain behavior of skin: Secondary | ICD-10-CM | POA: Diagnosis not present

## 2018-02-26 DIAGNOSIS — L821 Other seborrheic keratosis: Secondary | ICD-10-CM | POA: Diagnosis not present

## 2018-02-26 DIAGNOSIS — L578 Other skin changes due to chronic exposure to nonionizing radiation: Secondary | ICD-10-CM | POA: Diagnosis not present

## 2018-02-26 DIAGNOSIS — Z85828 Personal history of other malignant neoplasm of skin: Secondary | ICD-10-CM | POA: Diagnosis not present

## 2018-03-25 DIAGNOSIS — H401122 Primary open-angle glaucoma, left eye, moderate stage: Secondary | ICD-10-CM | POA: Diagnosis not present

## 2018-04-04 ENCOUNTER — Ambulatory Visit: Payer: PPO

## 2018-04-08 DIAGNOSIS — H401122 Primary open-angle glaucoma, left eye, moderate stage: Secondary | ICD-10-CM | POA: Diagnosis not present

## 2018-04-16 ENCOUNTER — Ambulatory Visit (INDEPENDENT_AMBULATORY_CARE_PROVIDER_SITE_OTHER): Payer: PPO

## 2018-04-16 ENCOUNTER — Other Ambulatory Visit: Payer: PPO

## 2018-04-16 VITALS — BP 126/72 | HR 63 | Temp 97.9°F | Resp 15 | Ht 65.0 in | Wt 181.0 lb

## 2018-04-16 DIAGNOSIS — E119 Type 2 diabetes mellitus without complications: Secondary | ICD-10-CM | POA: Diagnosis not present

## 2018-04-16 DIAGNOSIS — D696 Thrombocytopenia, unspecified: Secondary | ICD-10-CM

## 2018-04-16 DIAGNOSIS — E7800 Pure hypercholesterolemia, unspecified: Secondary | ICD-10-CM

## 2018-04-16 DIAGNOSIS — E2839 Other primary ovarian failure: Secondary | ICD-10-CM | POA: Diagnosis not present

## 2018-04-16 DIAGNOSIS — Z0000 Encounter for general adult medical examination without abnormal findings: Secondary | ICD-10-CM

## 2018-04-16 LAB — CBC WITH DIFFERENTIAL/PLATELET
BASOS ABS: 0 10*3/uL (ref 0.0–0.1)
Basophils Relative: 0.5 % (ref 0.0–3.0)
EOS ABS: 0.1 10*3/uL (ref 0.0–0.7)
Eosinophils Relative: 1.4 % (ref 0.0–5.0)
HCT: 42.7 % (ref 36.0–46.0)
Hemoglobin: 14.9 g/dL (ref 12.0–15.0)
Lymphocytes Relative: 33.7 % (ref 12.0–46.0)
Lymphs Abs: 2 10*3/uL (ref 0.7–4.0)
MCHC: 34.9 g/dL (ref 30.0–36.0)
MCV: 93.7 fl (ref 78.0–100.0)
MONO ABS: 0.4 10*3/uL (ref 0.1–1.0)
Monocytes Relative: 6.7 % (ref 3.0–12.0)
NEUTROS ABS: 3.4 10*3/uL (ref 1.4–7.7)
NEUTROS PCT: 57.7 % (ref 43.0–77.0)
PLATELETS: 178 10*3/uL (ref 150.0–400.0)
RBC: 4.56 Mil/uL (ref 3.87–5.11)
RDW: 13.4 % (ref 11.5–15.5)
WBC: 5.9 10*3/uL (ref 4.0–10.5)

## 2018-04-16 LAB — BASIC METABOLIC PANEL
BUN: 21 mg/dL (ref 6–23)
CO2: 28 mEq/L (ref 19–32)
Calcium: 9.4 mg/dL (ref 8.4–10.5)
Chloride: 107 mEq/L (ref 96–112)
Creatinine, Ser: 0.72 mg/dL (ref 0.40–1.20)
GFR: 85.51 mL/min (ref >60.00)
Glucose, Bld: 145 mg/dL — ABNORMAL HIGH (ref 70–99)
POTASSIUM: 4.2 meq/L (ref 3.5–5.1)
Sodium: 142 mEq/L (ref 135–145)

## 2018-04-16 LAB — HEPATIC FUNCTION PANEL
ALBUMIN: 4.4 g/dL (ref 3.5–5.2)
ALK PHOS: 58 U/L (ref 39–117)
ALT: 30 U/L (ref 0–35)
AST: 26 U/L (ref 0–37)
Bilirubin, Direct: 0.1 mg/dL (ref 0.0–0.3)
TOTAL PROTEIN: 6.3 g/dL (ref 6.0–8.3)
Total Bilirubin: 0.9 mg/dL (ref 0.2–1.2)

## 2018-04-16 LAB — LIPID PANEL
Cholesterol: 202 mg/dL — ABNORMAL HIGH (ref 0–200)
HDL: 60.6 mg/dL (ref >39.00)
LDL Cholesterol: 115 mg/dL — ABNORMAL HIGH (ref 0–99)
NonHDL: 141.75
Total CHOL/HDL Ratio: 3
Triglycerides: 132 mg/dL (ref 0.0–149.0)
VLDL: 26.4 mg/dL (ref 0.0–40.0)

## 2018-04-16 LAB — HEMOGLOBIN A1C: HEMOGLOBIN A1C: 6.4 % (ref 4.6–6.5)

## 2018-04-16 NOTE — Progress Notes (Signed)
Subjective:   Victoria Harris is a 67 y.o. female who presents for Medicare Annual (Subsequent) preventive examination.  Review of Systems:  No ROS.  Medicare Wellness Visit. Additional risk factors are reflected in the social history. Cardiac Risk Factors include: advanced age (>88men, >64 women);obesity (BMI >30kg/m2);diabetes mellitus     Objective:     Vitals: BP 126/72 (BP Location: Left Arm, Patient Position: Sitting, Cuff Size: Normal)   Pulse 63   Temp 97.9 F (36.6 C) (Oral)   Resp 15   Ht 5\' 5"  (1.651 m)   Wt 181 lb (82.1 kg)   SpO2 96%   BMI 30.12 kg/m   Body mass index is 30.12 kg/m.  Advanced Directives 04/16/2018 04/02/2017 10/09/2016 04/05/2015  Does Patient Have a Medical Advance Directive? Yes Yes Yes Yes  Type of Paramedic of [place];Living will Living will;Healthcare Power of Attorney - -  Does patient want to make changes to medical advance directive? No - Patient declined No - Patient declined - -  Copy of [place] in Chart? No - copy requested No - copy requested - -    Tobacco Social History   Tobacco Use  Smoking Status Never Smoker  Smokeless Tobacco Never Used     Counseling given: Not Answered   Clinical Intake:  Pre-visit preparation completed: Yes  Pain : No/denies pain     Nutritional Status: BMI > 30  Obese Diabetes: Yes(Followed by pcp)  How often do you need to have someone help you when you read instructions, pamphlets, or other written materials from your doctor or pharmacy?: 1 - Never  Interpreter Needed?: No     Past Medical History:  Diagnosis Date  . Diverticulosis   . Hypercholesterolemia   . Serrated adenoma of colon   . Stroke (cerebrum) ([place])   . Thrombocytopenia ([place])   . Uterus descensus    Past Surgical History:  Procedure Laterality Date  . ABDOMINAL HYSTERECTOMY  2009  . CHOLECYSTECTOMY  2007  . COLONOSCOPY N/A 04/05/2015   Procedure:  COLONOSCOPY;  Surgeon: Lollie Sails, MD;  Location: [facility] ENDOSCOPY;  Service: Endoscopy;  Laterality: N/A;  . COLONOSCOPY WITH PROPOFOL N/A 10/09/2016   Procedure: COLONOSCOPY WITH PROPOFOL;  Surgeon: Lollie Sails, MD;  Location[facility] ENDOSCOPY;  Service: Endoscopy;  Laterality: N/A;  . DILATION AND CURETTAGE OF UTERUS  2001 and 2006  . FOOT SURGERY  2007  . TONSILLECTOMY  1960  . TUBAL LIGATION    . vocal cord cyst removed     Family History  Problem Relation Age of Onset  . Diabetes Unknown   . Colon cancer Mother   . Diabetes Mother   . Kidney disease Mother   . Heart disease Mother   . Stroke Paternal Grandmother   . Stroke Paternal Grandfather   . Prostate cancer Father   . Diabetes Father   . Glaucoma Father   . Breast cancer Paternal Aunt        x2  . Uterine cancer Maternal Aunt   . Diabetes Brother   . Diabetes Brother    Social History   Socioeconomic History  . Marital status: Married    Spouse name: Not on file  . Number of children: Not on file  . Years of education: Not on file  . Highest education level: Not on file  Occupational History  . Not on file  Social Needs  . Financial resource strain: Not hard at all  .  Food insecurity:    Worry: Never true    Inability: Never true  . Transportation needs:    Medical: No    Non-medical: No  Tobacco Use  . Smoking status: Never Smoker  . Smokeless tobacco: Never Used  Substance and Sexual Activity  . Alcohol use: No    Alcohol/week: 0.0 standard drinks  . Drug use: No  . Sexual activity: Never  Lifestyle  . Physical activity:    Days per week: 3 days    Minutes per session: 20 min  . Stress: Not at all  Relationships  . Social connections:    Talks on phone: Not on file    Gets together: Not on file    Attends religious service: Not on file    Active member of club or organization: Not on file    Attends meetings of clubs or organizations: Not on file    Relationship status: Married    Other Topics Concern  . Not on file  Social History Narrative  . Not on file    Outpatient Encounter Medications as of 04/16/2018  Medication Sig  . aspirin 81 MG tablet Take 81 mg by mouth daily.  . Cholecalciferol (VITAMIN D-1000 MAX ST) 1000 units tablet Take by mouth.  . colestipol (COLESTID) 1 G tablet Take 1 g by mouth as needed.  . dorzolamide-timolol (COSOPT) 22.3-6.8 MG/ML ophthalmic solution Place 1 drop into both eyes 2 (two) times daily.  . Multiple Vitamins-Minerals (MULTIVITAMIN ADULT PO) Take by mouth daily.  . Omega-3 Fatty Acids (FISH OIL) 1000 MG CAPS Take by mouth daily.  . Saccharomyces boulardii (FLORASTOR PO) Take by mouth daily as needed.  . travoprost, benzalkonium, (TRAVATAN) 0.004 % ophthalmic solution Place 1 drop into both eyes daily.   . [DISCONTINUED] meloxicam (MOBIC) 7.5 MG tablet Take 1 tablet (7.5 mg total) by mouth daily.   No facility-administered encounter medications on file as of 04/16/2018.     Activities of Daily Living In your present state of health, do you have any difficulty performing the following activities: 04/16/2018  Hearing? N  Vision? N  Difficulty concentrating or making decisions? N  Walking or climbing stairs? N  Dressing or bathing? N  Doing errands, shopping? N  Preparing Food and eating ? N  Using the Toilet? N  In the past six months, have you accidently leaked urine? N  Do you have problems with loss of bowel control? N  Managing your Medications? N  Managing your Finances? N  Housekeeping or managing your Housekeeping? N  Some recent data might be hidden    Patient Care Team: Einar Pheasant, MD as PCP - General (Internal Medicine)    Assessment:   This is a routine wellness examination for Victoria Harris.  The goal of the wellness visit is to assist the patient how to close the gaps in care and create a preventative care plan for the patient.   Dermatology- Followed by Dr. Phillip Harris.  Recent removal of moles; all  margins clear. Return in 6 months follow up.   Taking calcium VIT D as appropriate/Osteoporosis risk reviewed.  Bone density ordered; follow as directed. Educational material provided.   Safety issues reviewed; Smoke and carbon monoxide detectors in the home. No firearms in the home. Wears seatbelts when driving or riding with others. No violence in the home.  They do not have excessive sun exposure.  Discussed the need for sun protection: hats, long sleeves and the use of sunscreen if there is significant  sun exposure.  Patient is alert, normal appearance, oriented to person/place/and time.  Correctly identified the president of the [place] and recalls of 3/3 words. Performs simple calculations and can read correct time from watch face.  Displays appropriate judgement.  No new identified risk were noted.  No failures at ADL's or IADL's.    BMI- discussed the importance of a healthy diet, water intake and the benefits of aerobic exercise. Educational material provided.   24 hour diet recall: Moderate diet.    Dental- every 6 months.  Sleep patterns- Sleeps 7 hours at night.  Wakes feeling rested.   Diabetes- followed by pcp.  FBS today 165.   Fasting labs completed.   TDAP vaccine deferred per patient preference.  Follow up with insurance.  Educational material provided.  Influenza vaccine discussed. She plans to receive on Friday with pcp.   Exercise Activities and Dietary recommendations Current Exercise Habits: Home exercise routine, Type of exercise: walking, Time (Minutes): 20, Frequency (Times/Week): 3, Weekly Exercise (Minutes/Week): 60, Intensity: Moderate  Goals    . Increase physical activity     Walk for exercise       Fall Risk Fall Risk  04/16/2018 04/02/2017 03/23/2016 07/19/2015 07/05/2014  Falls in the past year? 0 No No No No   Depression Screen PHQ 2/9 Scores 04/16/2018 04/02/2017 03/23/2016 07/19/2015  PHQ - 2 Score 0 0 0 0  PHQ- 9 Score - 0 - -      Cognitive Function MMSE - Mini Mental State Exam 04/16/2018 04/02/2017  Orientation to time 5 5  Orientation to Place 5 5  Registration 3 3  Attention/ Calculation 5 5  Recall 3 3  Language- name 2 objects 2 2  Language- repeat 1 1  Language- follow 3 step command 3 3  Language- read & follow direction 1 1  Write a sentence 1 1  Copy design 1 1  Total score 30 30        Immunization History  Administered Date(s) Administered  . Influenza, High Dose Seasonal PF 03/23/2016, 04/04/2017  . Influenza,inj,Quad PF,6+ Mos 05/25/2015  . Pneumococcal Conjugate-13 07/19/2015  . Pneumococcal Polysaccharide-23 11/29/2016  . Zoster 09/17/2013   Screening Tests Health Maintenance  Topic Date Due  . TETANUS/TDAP  11/13/1968  . DEXA SCAN  11/14/2014  . INFLUENZA VACCINE  12/26/2017  . HEMOGLOBIN A1C  06/19/2018  . FOOT EXAM  08/07/2018  . MAMMOGRAM  08/20/2018  . URINE MICROALBUMIN  12/18/2018  . OPHTHALMOLOGY EXAM  04/16/2019  . COLONOSCOPY  10/10/2026  . Hepatitis C Screening  Completed  . PNA vac Low Risk Adult  Completed      Plan:    End of life planning; Advance aging; Advanced directives discussed. Copy of current HCPOA/Living Will requested.    I have personally reviewed and noted the following in the patient's chart:   . Medical and social history . Use of alcohol, tobacco or illicit drugs  . Current medications and supplements . Functional ability and status . Nutritional status . Physical activity . Advanced directives . List of other physicians . Hospitalizations, surgeries, and ER visits in previous 12 months . Vitals . Screenings to include cognitive, depression, and falls . Referrals and appointments  In addition, I have reviewed and discussed with patient certain preventive protocols, quality metrics, and best practice recommendations. A written personalized care plan for preventive services as well as general preventive health recommendations were  provided to patient.     OBrien-Blaney, Mariano Doshi L, LPN  04/16/2018   Reviewed above information.  Agree with assessment and plan.    Dr Nicki Reaper

## 2018-04-16 NOTE — Patient Instructions (Addendum)
Victoria Harris , Thank you for taking time to come for your Medicare Wellness Visit. I appreciate your ongoing commitment to your health goals. Please review the following plan we discussed and let me know if I can assist you in the future.   Bone density ordered; follow as directed.   Follow up as needed.    Bring a copy of your [place] and/or Living Will to be scanned into chart.  Happy Holidays!  These are the goals we discussed: Goals    . Increase physical activity     Walk for exercise       This is a list of the screening recommended for you and due dates:  Health Maintenance  Topic Date Due  . Tetanus Vaccine  11/13/1968  . DEXA scan (bone density measurement)  11/14/2014  . Flu Shot  12/26/2017  . Hemoglobin A1C  06/19/2018  . Complete foot exam   08/07/2018  . Mammogram  08/20/2018  . Urine Protein Check  12/18/2018  . Eye exam for diabetics  04/16/2019  . Colon Cancer Screening  10/10/2026  .  Hepatitis C: One time screening is recommended by Center for Disease Control  (CDC) for  adults born from [birth year] through 1966.   Completed  . Pneumonia vaccines  Completed   Bone Densitometry Bone densitometry is an imaging test that uses a special X-ray to measure the amount of calcium and other minerals in your bones (bone density). This test is also known as a bone mineral density test or dual-energy X-ray absorptiometry (DXA). The test can measure bone density at your hip and your spine. It is similar to having a regular X-ray. You may have this test to:  Diagnose a condition that causes weak or thin bones (osteoporosis).  Predict your risk of a broken bone (fracture).  Determine how well osteoporosis treatment is working.  Tell a health care provider about:  Any allergies you have.  All medicines you are taking, including vitamins, herbs, eye drops, creams, and over-the-counter medicines.  Any problems you or family members have had with  anesthetic medicines.  Any blood disorders you have.  Any surgeries you have had.  Any medical conditions you have.  Possibility of pregnancy.  Any other medical test you had within the previous 14 days that used contrast material. What are the risks? Generally, this is a safe procedure. However, problems can occur and may include the following:  This test exposes you to a very small amount of radiation.  The risks of radiation exposure may be greater to unborn children.  What happens before the procedure?  Do not take any calcium supplements for 24 hours before having the test. You can otherwise eat and drink what you usually do.  Take off all metal jewelry, eyeglasses, dental appliances, and any other metal objects. What happens during the procedure?  You may lie on an exam table. There will be an X-ray generator below you and an imaging device above you.  Other devices, such as boxes or braces, may be used to position your body properly for the scan.  You will need to lie still while the machine slowly scans your body.  The images will show up on a computer monitor. What happens after the procedure? You may need more testing at a later time. This information is not intended to replace advice given to you by your health care provider. Make sure you discuss any questions you have with your health  care provider. Document Released: 06/05/2004 Document Revised: 10/20/2015 Document Reviewed: 10/22/2013 Elsevier Interactive Patient Education  2018 [employer].

## 2018-04-18 ENCOUNTER — Encounter: Payer: Self-pay | Admitting: Internal Medicine

## 2018-04-18 ENCOUNTER — Ambulatory Visit (INDEPENDENT_AMBULATORY_CARE_PROVIDER_SITE_OTHER): Payer: PPO | Admitting: Internal Medicine

## 2018-04-18 DIAGNOSIS — Z23 Encounter for immunization: Secondary | ICD-10-CM

## 2018-04-18 DIAGNOSIS — Z6830 Body mass index (BMI) 30.0-30.9, adult: Secondary | ICD-10-CM

## 2018-04-18 DIAGNOSIS — E7800 Pure hypercholesterolemia, unspecified: Secondary | ICD-10-CM

## 2018-04-18 DIAGNOSIS — Z8673 Personal history of transient ischemic attack (TIA), and cerebral infarction without residual deficits: Secondary | ICD-10-CM | POA: Diagnosis not present

## 2018-04-18 DIAGNOSIS — E119 Type 2 diabetes mellitus without complications: Secondary | ICD-10-CM

## 2018-04-18 NOTE — Progress Notes (Signed)
[name], female   DOB: 12-03-1950, 67 y.o.   MRN: [id number]   Subjective:    [name], female    DOB: Jun 01, 1950, 67 y.o.   MRN: [id number]  HPI  Patient here for a scheduled follow up.  Saw Lauren recently for right hip pain.  Was given meloxicam.  No taking.  Is better.  States overall she is doing relatively well.  No chest pain.  No sob.  No acid reflux.  No abdominal pain.  Bowels moving.  No urine change.  Blood pressure doing well.  Watching her diet.  Low carb diet and exercise.     Past Medical History:  Diagnosis Date  . Diverticulosis   . Hypercholesterolemia   . Serrated adenoma of colon   . Stroke (cerebrum) ([place])   . Thrombocytopenia ([place])   . Uterus descensus    Past Surgical History:  Procedure Laterality Date  . ABDOMINAL HYSTERECTOMY  2009  . CHOLECYSTECTOMY  2007  . COLONOSCOPY N/A 04/05/2015   Procedure: COLONOSCOPY;  Surgeon: Lollie Sails, MD;  Location: [facility] ENDOSCOPY;  Service: Endoscopy;  Laterality: N/A;  . COLONOSCOPY WITH PROPOFOL N/A 10/09/2016   Procedure: COLONOSCOPY WITH PROPOFOL;  Surgeon: Lollie Sails, MD;  Location: [facility] ENDOSCOPY;  Service: Endoscopy;  Laterality: N/A;  . DILATION AND CURETTAGE OF UTERUS  2001 and 2006  . FOOT SURGERY  2007  . TONSILLECTOMY  1960  . TUBAL LIGATION    . vocal cord cyst removed     Family History  Problem Relation Age of Onset  . Diabetes Unknown   . Colon cancer Mother   . Diabetes Mother   . Kidney disease Mother   . Heart disease Mother   . Stroke Paternal Grandmother   . Stroke Paternal Grandfather   . Prostate cancer Father   . Diabetes Father   . Glaucoma Father   . Breast cancer Paternal Aunt        x2  . Uterine cancer Maternal Aunt   . Diabetes Brother   . Diabetes Brother    Social History   Socioeconomic History  . Marital status: Married    Spouse name: Not on file  . Number of children: Not on file  . Years of education:  Not on file  . Highest education level: Not on file  Occupational History  . Not on file  Social Needs  . Financial resource strain: Not hard at all  . Food insecurity:    Worry: Never true    Inability: Never true  . Transportation needs:    Medical: No    Non-medical: No  Tobacco Use  . Smoking status: Never Smoker  . Smokeless tobacco: Never Used  Substance and Sexual Activity  . Alcohol use: No    Alcohol/week: 0.0 standard drinks  . Drug use: No  . Sexual activity: Never  Lifestyle  . Physical activity:    Days per week: 3 days    Minutes per session: 20 min  . Stress: Not at all  Relationships  . Social connections:    Talks on phone: Not on file    Gets together: Not on file    Attends religious service: Not on file    Active member of club or organization: Not on file    Attends meetings of clubs or organizations: Not on file    Relationship status: Married  Other Topics Concern  . Not on file  Social History  Narrative  . Not on file    Outpatient Encounter Medications as of 04/18/2018  Medication Sig  . aspirin 81 MG tablet Take 81 mg by mouth daily.  . Cholecalciferol (VITAMIN D-1000 MAX ST) 1000 units tablet Take by mouth.  . colestipol (COLESTID) 1 G tablet Take 1 g by mouth as needed.  . dorzolamide-timolol (COSOPT) 22.3-6.8 MG/ML ophthalmic solution Place 1 drop into both eyes 2 (two) times daily.  . Multiple Vitamins-Minerals (MULTIVITAMIN ADULT PO) Take by mouth daily.  . Omega-3 Fatty Acids (FISH OIL) 1000 MG CAPS Take by mouth daily.  . Saccharomyces boulardii (FLORASTOR PO) Take by mouth daily as needed.  . travoprost, benzalkonium, (TRAVATAN) 0.004 % ophthalmic solution Place 1 drop into both eyes daily.    No facility-administered encounter medications on file as of 04/18/2018.     Review of Systems  Constitutional: Negative for appetite change and unexpected weight change.  HENT: Negative for congestion and sinus pressure.   Respiratory:  Negative for cough, chest tightness and shortness of breath.   Cardiovascular: Negative for chest pain, palpitations and leg swelling.  Gastrointestinal: Negative for abdominal pain, diarrhea, nausea and vomiting.  Genitourinary: Negative for difficulty urinating and dysuria.  Musculoskeletal: Negative for joint swelling and myalgias.  Skin: Negative for color change and rash.  Neurological: Negative for dizziness, light-headedness and headaches.  Psychiatric/Behavioral: Negative for agitation and dysphoric mood.       Objective:    Physical Exam  Constitutional: She appears well-developed and well-nourished. No distress.  HENT:  Nose: Nose normal.  Mouth/Throat: Oropharynx is clear and moist.  Neck: Neck supple. No thyromegaly present.  Cardiovascular: Normal rate and regular rhythm.  Pulmonary/Chest: Breath sounds normal. No respiratory distress. She has no wheezes.  Abdominal: Soft. Bowel sounds are normal. There is no tenderness.  Musculoskeletal: She exhibits no edema or tenderness.  Lymphadenopathy:    She has no cervical adenopathy.  Skin: No rash noted. No erythema.  Psychiatric: She has a normal mood and affect. Her behavior is normal.    BP 132/84   Pulse 62   Temp 97.8 F (36.6 C) (Oral)   Resp 18   Wt 181 lb 9.6 oz (82.4 kg)   SpO2 97%   BMI 30.22 kg/m  Wt Readings from Last 3 Encounters:  04/18/18 181 lb 9.6 oz (82.4 kg)  04/16/18 181 lb (82.1 kg)  01/16/18 184 lb 4 oz (83.6 kg)     Lab Results  Component Value Date   WBC 5.9 04/16/2018   HGB 14.9 04/16/2018   HCT 42.7 04/16/2018   PLT 178.0 04/16/2018   GLUCOSE 145 (H) 04/16/2018   CHOL 202 (H) 04/16/2018   TRIG 132.0 04/16/2018   HDL 60.60 04/16/2018   LDLCALC 115 (H) 04/16/2018   ALT 30 04/16/2018   AST 26 04/16/2018   NA 142 04/16/2018   K 4.2 04/16/2018   CL 107 04/16/2018   CREATININE 0.72 04/16/2018   BUN 21 04/16/2018   CO2 28 04/16/2018   TSH 1.25 07/29/2017   HGBA1C 6.4  04/16/2018   MICROALBUR <0.7 12/17/2017    Mm Screening Breast Tomo Bilateral  Result Date: 08/19/2017 CLINICAL DATA:  Screening. EXAM: DIGITAL SCREENING BILATERAL MAMMOGRAM WITH TOMO AND CAD COMPARISON:  Previous exam(s). ACR Breast Density Category b: There are scattered areas of fibroglandular density. FINDINGS: There are no findings suspicious for malignancy. Images were processed with CAD. IMPRESSION: No mammographic evidence of malignancy. A result letter of this screening mammogram will be mailed  directly to the patient. RECOMMENDATION: Screening mammogram in one year. (Code:SM-B-01Y) BI-RADS CATEGORY  1: Negative. Electronically Signed   By: Curlene Dolphin M.D.   On: 08/19/2017 16:22       Assessment & Plan:   Problem List Items Addressed This Visit    BMI 30.0-30.9,adult    Discussed diet and exercise.  Follow.        Diabetes mellitus without complication (HCC)    Low carb diet and exercise.  Follow met b and a1c.        Relevant Orders   Hemoglobin A1c   TSH   Basic metabolic panel   History of CVA (cerebrovascular accident)    Continue daily aspirin.        Hypercholesteremia    Discussed with her regarding starting a cholesterol medication and calculated cholesterol risk.  She declines to start.  Low cholesterol diet and exercise.  Follow lipid panel.        Relevant Orders   Hepatic function panel   Lipid panel    Other Visit Diagnoses    Encounter for immunization       Relevant Orders   Flu vaccine HIGH DOSE PF (Completed)       Einar Pheasant, MD

## 2018-04-27 ENCOUNTER — Encounter: Payer: Self-pay | Admitting: Internal Medicine

## 2018-04-27 NOTE — Assessment & Plan Note (Signed)
Discussed with her regarding starting a cholesterol medication and calculated cholesterol risk.  She declines to start.  Low cholesterol diet and exercise.  Follow lipid panel.

## 2018-04-27 NOTE — Assessment & Plan Note (Signed)
Low carb diet and exercise.  Follow met b and a1c.   

## 2018-04-27 NOTE — Assessment & Plan Note (Signed)
Discussed diet and exercise.  Follow.  

## 2018-04-27 NOTE — Assessment & Plan Note (Signed)
Continue daily aspirin 

## 2018-07-25 ENCOUNTER — Other Ambulatory Visit: Payer: Self-pay | Admitting: Internal Medicine

## 2018-07-25 DIAGNOSIS — Z1231 Encounter for screening mammogram for malignant neoplasm of breast: Secondary | ICD-10-CM

## 2018-08-18 ENCOUNTER — Other Ambulatory Visit: Payer: PPO

## 2018-08-19 ENCOUNTER — Other Ambulatory Visit: Payer: Self-pay

## 2018-08-19 ENCOUNTER — Ambulatory Visit (INDEPENDENT_AMBULATORY_CARE_PROVIDER_SITE_OTHER): Payer: PPO | Admitting: Internal Medicine

## 2018-08-19 ENCOUNTER — Encounter: Payer: PPO | Admitting: Internal Medicine

## 2018-08-19 VITALS — BP 130/80 | HR 64 | Temp 97.8°F | Resp 16 | Ht 65.0 in | Wt 183.2 lb

## 2018-08-19 DIAGNOSIS — Z0000 Encounter for general adult medical examination without abnormal findings: Secondary | ICD-10-CM | POA: Diagnosis not present

## 2018-08-19 DIAGNOSIS — D696 Thrombocytopenia, unspecified: Secondary | ICD-10-CM

## 2018-08-19 DIAGNOSIS — E7800 Pure hypercholesterolemia, unspecified: Secondary | ICD-10-CM

## 2018-08-19 DIAGNOSIS — Z8673 Personal history of transient ischemic attack (TIA), and cerebral infarction without residual deficits: Secondary | ICD-10-CM | POA: Diagnosis not present

## 2018-08-19 DIAGNOSIS — E119 Type 2 diabetes mellitus without complications: Secondary | ICD-10-CM | POA: Diagnosis not present

## 2018-08-19 LAB — BASIC METABOLIC PANEL
BUN: 19 mg/dL (ref 6–23)
CHLORIDE: 107 meq/L (ref 96–112)
CO2: 29 mEq/L (ref 19–32)
CREATININE: 0.62 mg/dL (ref 0.40–1.20)
Calcium: 9.3 mg/dL (ref 8.4–10.5)
GFR: 95.51 mL/min (ref >60.00)
Glucose, Bld: 131 mg/dL — ABNORMAL HIGH (ref 70–99)
Potassium: 3.9 mEq/L (ref 3.5–5.1)
Sodium: 142 mEq/L (ref 135–145)

## 2018-08-19 LAB — LIPID PANEL
Cholesterol: 201 mg/dL — ABNORMAL HIGH (ref 0–200)
HDL: 58.9 mg/dL (ref >39.00)
LDL Cholesterol: 119 mg/dL — ABNORMAL HIGH (ref 0–99)
NonHDL: 142.51
Total CHOL/HDL Ratio: 3
Triglycerides: 117 mg/dL (ref 0.0–149.0)
VLDL: 23.4 mg/dL (ref 0.0–40.0)

## 2018-08-19 LAB — HEMOGLOBIN A1C: Hgb A1c MFr Bld: 7.1 % — ABNORMAL HIGH (ref 4.6–6.5)

## 2018-08-19 LAB — TSH: TSH: 1.37 u[IU]/mL (ref 0.35–4.50)

## 2018-08-19 LAB — HEPATIC FUNCTION PANEL
ALT: 33 U/L (ref 0–35)
AST: 29 U/L (ref 0–37)
Albumin: 4.4 g/dL (ref 3.5–5.2)
Alkaline Phosphatase: 62 U/L (ref 39–117)
Bilirubin, Direct: 0.2 mg/dL (ref 0.0–0.3)
Total Bilirubin: 0.8 mg/dL (ref 0.2–1.2)
Total Protein: 6.3 g/dL (ref 6.0–8.3)

## 2018-08-19 NOTE — Assessment & Plan Note (Addendum)
Physical today 08/19/18.  Mammogram 08/19/17 - Birads I.  Was scheduled for f/u mammogram tomorrow.  Was changed to later date.  Colonoscopy 10/09/16 - multiple polyps.  Recommended f/u colonoscopy in 3 years.

## 2018-08-19 NOTE — Progress Notes (Signed)
Subjective:    [name], female    DOB: 06-29-1950, 68 y.o.   MRN: [id number]  HPI  Patient here for her physical exam.  She reports she is doing relatively well.  Increased stress with her father's medical issues.  Overall she feels she is handling things relatively well.  Did travel to [place].  Drove.  Back on 08/04/18.  Not known exposures to corona virus.  No fever.  No cough or congestion.  No sob.  No chest pain.  No acid reflux.  No abdominal pain.  Bowels moving.  No urine change.     Past Medical History:  Diagnosis Date  . Diverticulosis   . Hypercholesterolemia   . Serrated adenoma of colon   . Stroke (cerebrum) ([place])   . Thrombocytopenia ([place])   . Uterus descensus    Past Surgical History:  Procedure Laterality Date  . ABDOMINAL HYSTERECTOMY  2009  . CHOLECYSTECTOMY  2007  . COLONOSCOPY N/A 04/05/2015   Procedure: COLONOSCOPY;  Surgeon: Lollie Sails, MD;  Location: [facility] ENDOSCOPY;  Service: Endoscopy;  Laterality: N/A;  . COLONOSCOPY WITH PROPOFOL N/A 10/09/2016   Procedure: COLONOSCOPY WITH PROPOFOL;  Surgeon: Lollie Sails, MD;  Location: [facility] ENDOSCOPY;  Service: Endoscopy;  Laterality: N/A;  . DILATION AND CURETTAGE OF UTERUS  2001 and 2006  . FOOT SURGERY  2007  . TONSILLECTOMY  1960  . TUBAL LIGATION    . vocal cord cyst removed     Family History  Problem Relation Age of Onset  . Diabetes Other   . Colon cancer Mother   . Diabetes Mother   . Kidney disease Mother   . Heart disease Mother   . Stroke Paternal Grandmother   . Stroke Paternal Grandfather   . Prostate cancer Father   . Diabetes Father   . Glaucoma Father   . Breast cancer Paternal Aunt        x2  . Uterine cancer Maternal Aunt   . Diabetes Brother   . Diabetes Brother    Social History   Socioeconomic History  . Marital status: Married    Spouse name: Not on file  . Number of children: Not on file  . Years of education: Not on file  .  Highest education level: Not on file  Occupational History  . Not on file  Social Needs  . Financial resource strain: Not hard at all  . Food insecurity:    Worry: Never true    Inability: Never true  . Transportation needs:    Medical: No    Non-medical: No  Tobacco Use  . Smoking status: Never Smoker  . Smokeless tobacco: Never Used  Substance and Sexual Activity  . Alcohol use: No    Alcohol/week: 0.0 standard drinks  . Drug use: No  . Sexual activity: Never  Lifestyle  . Physical activity:    Days per week: 3 days    Minutes per session: 20 min  . Stress: Not at all  Relationships  . Social connections:    Talks on phone: Not on file    Gets together: Not on file    Attends religious service: Not on file    Active member of club or organization: Not on file    Attends meetings of clubs or organizations: Not on file    Relationship status: Married  Other Topics Concern  . Not on file  Social History Narrative  . Not on file  Outpatient Encounter Medications as of 08/19/2018  Medication Sig  . aspirin 81 MG tablet Take 81 mg by mouth daily.  . Cholecalciferol (VITAMIN D-1000 MAX ST) 1000 units tablet Take by mouth.  . colestipol (COLESTID) 1 G tablet Take 1 g by mouth as needed.  . dorzolamide-timolol (COSOPT) 22.3-6.8 MG/ML ophthalmic solution Place 1 drop into both eyes 2 (two) times daily.  . Multiple Vitamins-Minerals (MULTIVITAMIN ADULT PO) Take by mouth daily.  . Omega-3 Fatty Acids (FISH OIL) 1000 MG CAPS Take by mouth daily.  . Saccharomyces boulardii (FLORASTOR PO) Take by mouth daily as needed.  . TRAVATAN Z 0.004 % SOLN ophthalmic solution Place 1 drop into both eyes at bedtime.  . [DISCONTINUED] travoprost, benzalkonium, (TRAVATAN) 0.004 % ophthalmic solution Place 1 drop into both eyes daily.    No facility-administered encounter medications on file as of 08/19/2018.     Review of Systems  Constitutional: Negative for appetite change and unexpected  weight change.  HENT: Negative for congestion and sinus pressure.   Eyes: Negative for pain and visual disturbance.  Respiratory: Negative for cough, chest tightness and shortness of breath.   Cardiovascular: Negative for chest pain, palpitations and leg swelling.  Gastrointestinal: Negative for abdominal pain, diarrhea, nausea and vomiting.  Genitourinary: Negative for difficulty urinating and dysuria.  Musculoskeletal: Negative for joint swelling and myalgias.  Skin: Negative for color change and rash.  Neurological: Negative for dizziness, light-headedness and headaches.  Hematological: Negative for adenopathy. Does not bruise/bleed easily.  Psychiatric/Behavioral: Negative for agitation and dysphoric mood.       Objective:    Physical Exam Constitutional:      General: She is not in acute distress.    Appearance: Normal appearance. She is well-developed.  HENT:     Nose: Nose normal. No congestion.     Mouth/Throat:     Pharynx: No oropharyngeal exudate or posterior oropharyngeal erythema.  Eyes:     General: No scleral icterus.       Right eye: No discharge.        Left eye: No discharge.  Neck:     Musculoskeletal: Neck supple. No muscular tenderness.     Thyroid: No thyromegaly.  Cardiovascular:     Rate and Rhythm: Normal rate and regular rhythm.  Pulmonary:     Effort: No tachypnea, accessory muscle usage or respiratory distress.     Breath sounds: Normal breath sounds. No decreased breath sounds or wheezing.  Chest:     Breasts:        Right: No inverted nipple, mass, nipple discharge or tenderness (no axillary adenopathy).        Left: No inverted nipple, mass, nipple discharge or tenderness (no axilarry adenopathy).  Abdominal:     General: Bowel sounds are normal.     Palpations: Abdomen is soft.     Tenderness: There is no abdominal tenderness.  Musculoskeletal:        General: No swelling or tenderness.  Lymphadenopathy:     Cervical: No cervical  adenopathy.  Skin:    Findings: No erythema or rash.  Neurological:     Mental Status: She is alert and oriented to person, place, and time.  Psychiatric:        Mood and Affect: Mood normal.        Behavior: Behavior normal.     BP 130/80   Pulse 64   Temp 97.8 F (36.6 C) (Oral)   Resp 16   Ht '5\' 5"'$  (1.651  m)   Wt 183 lb 3.2 oz (83.1 kg)   SpO2 97%   BMI 30.49 kg/m  Wt Readings from Last 3 Encounters:  08/19/18 183 lb 3.2 oz (83.1 kg)  04/18/18 181 lb 9.6 oz (82.4 kg)  04/16/18 181 lb (82.1 kg)     Lab Results  Component Value Date   WBC 5.9 04/16/2018   HGB 14.9 04/16/2018   HCT 42.7 04/16/2018   PLT 178.0 04/16/2018   GLUCOSE 131 (H) 08/19/2018   CHOL 201 (H) 08/19/2018   TRIG 117.0 08/19/2018   HDL 58.90 08/19/2018   LDLCALC 119 (H) 08/19/2018   ALT 33 08/19/2018   AST 29 08/19/2018   NA 142 08/19/2018   K 3.9 08/19/2018   CL 107 08/19/2018   CREATININE 0.62 08/19/2018   BUN 19 08/19/2018   CO2 29 08/19/2018   TSH 1.37 08/19/2018   HGBA1C 7.1 (H) 08/19/2018   MICROALBUR <0.7 12/17/2017    Mm Screening Breast Tomo Bilateral  Result Date: 08/19/2017 CLINICAL DATA:  Screening. EXAM: DIGITAL SCREENING BILATERAL MAMMOGRAM WITH TOMO AND CAD COMPARISON:  Previous exam(s). ACR Breast Density Category b: There are scattered areas of fibroglandular density. FINDINGS: There are no findings suspicious for malignancy. Images were processed with CAD. IMPRESSION: No mammographic evidence of malignancy. A result letter of this screening mammogram will be mailed directly to the patient. RECOMMENDATION: Screening mammogram in one year. (Code:SM-B-01Y) BI-RADS CATEGORY  1: Negative. Electronically Signed   By: Curlene Dolphin M.D.   On: 08/19/2017 16:22       Assessment & Plan:   Problem List Items Addressed This Visit    Diabetes mellitus without complication ([place])    Low carb diet and exercise.  Follow met b and a1c.        Health care maintenance    Physical today  08/19/18.  Mammogram 08/19/17 - Birads I.  Was scheduled for f/u mammogram tomorrow.  Was changed to later date.  Colonoscopy 10/09/16 - multiple polyps.  Recommended f/u colonoscopy in 3 years.        History of CVA (cerebrovascular accident)    Continue daily aspirin.       Hypercholesteremia    Have discussed starting cholesterol medication.  She has declined.  Low cholesterol diet and exercise.  Follow lipid panel and liver function tests.        Thrombocytopenia (HCC)    Last platelet count wnl.  Follow cbc.         Other Visit Diagnoses    Routine general medical examination at a health care facility    -  Primary       Einar Pheasant, MD

## 2018-08-20 ENCOUNTER — Encounter: Payer: Self-pay | Admitting: Internal Medicine

## 2018-08-20 NOTE — Assessment & Plan Note (Signed)
Low carb diet and exercise.  Follow met b and a1c.   

## 2018-08-20 NOTE — Assessment & Plan Note (Signed)
Continue daily aspirin 

## 2018-08-20 NOTE — Assessment & Plan Note (Signed)
Have discussed starting cholesterol medication.  She has declined.  Low cholesterol diet and exercise.  Follow lipid panel and liver function tests.

## 2018-08-20 NOTE — Assessment & Plan Note (Signed)
Last platelet count wnl.  Follow cbc.  

## 2018-09-09 DIAGNOSIS — L918 Other hypertrophic disorders of the skin: Secondary | ICD-10-CM | POA: Diagnosis not present

## 2018-09-09 DIAGNOSIS — L821 Other seborrheic keratosis: Secondary | ICD-10-CM | POA: Diagnosis not present

## 2018-09-09 DIAGNOSIS — Z85828 Personal history of other malignant neoplasm of skin: Secondary | ICD-10-CM | POA: Diagnosis not present

## 2018-09-09 DIAGNOSIS — L570 Actinic keratosis: Secondary | ICD-10-CM | POA: Diagnosis not present

## 2018-09-09 DIAGNOSIS — L578 Other skin changes due to chronic exposure to nonionizing radiation: Secondary | ICD-10-CM | POA: Diagnosis not present

## 2018-09-09 DIAGNOSIS — Z872 Personal history of diseases of the skin and subcutaneous tissue: Secondary | ICD-10-CM | POA: Diagnosis not present

## 2018-09-09 DIAGNOSIS — L814 Other melanin hyperpigmentation: Secondary | ICD-10-CM | POA: Diagnosis not present

## 2018-09-22 ENCOUNTER — Other Ambulatory Visit: Payer: Self-pay

## 2018-09-22 ENCOUNTER — Encounter: Payer: Self-pay | Admitting: Podiatry

## 2018-09-22 ENCOUNTER — Ambulatory Visit: Payer: PPO | Admitting: Podiatry

## 2018-09-22 VITALS — Temp 97.4°F

## 2018-09-22 DIAGNOSIS — E119 Type 2 diabetes mellitus without complications: Secondary | ICD-10-CM

## 2018-09-22 DIAGNOSIS — M2012 Hallux valgus (acquired), left foot: Secondary | ICD-10-CM | POA: Diagnosis not present

## 2018-09-22 DIAGNOSIS — B351 Tinea unguium: Secondary | ICD-10-CM

## 2018-09-22 DIAGNOSIS — M79674 Pain in right toe(s): Secondary | ICD-10-CM | POA: Diagnosis not present

## 2018-09-22 NOTE — Progress Notes (Signed)
This patient presents to the office with chief complaint of long thick nail right foot  and diabetic feet.  This patient  says there  is  no pain and discomfort in herfeet.  This patient says there are long thick painful nails.  These nails are painful walking and wearing shoes.  Patient has no history of infection or drainage from both feet. Previous nail surgery for permanent removal left big toenail.  Patient is unable to  self treat his own nails . This patient presents  to the office today for treatment of the  long nails and a foot evaluation due to history of  Diabetes.  Patient had surgery performed years ago .  General Appearance  Alert, conversant and in no acute stress.  Vascular  Dorsalis pedis and posterior tibial  pulses are palpable  bilaterally.  Capillary return is within normal limits  bilaterally. Temperature is within normal limits  bilaterally.  Neurologic  Senn-Weinstein monofilament wire test within normal limits  bilaterally. Muscle power within normal limits bilaterally.  Nails Thick disfigured discolored nails with subungual debris  Hallux right nail. No evidence of bacterial infection or drainage bilaterally.  Orthopedic  No limitations of motion of motion feet .  No crepitus or effusions noted.  HAV  B/L.  Midfoot arthritis  B/L.  Skin  normotropic skin with no porokeratosis noted bilaterally.  No signs of infections or ulcers noted.     Onychomycosis  Diabetes with no foot complications  IE  Debride nails x 1  A diabetic foot exam was performed and there is no evidence of any vascular or neurologic pathology.   RTC 3 months.   Gardiner Barefoot DPM

## 2018-10-06 ENCOUNTER — Telehealth: Payer: Self-pay | Admitting: Internal Medicine

## 2018-10-06 DIAGNOSIS — H401122 Primary open-angle glaucoma, left eye, moderate stage: Secondary | ICD-10-CM | POA: Diagnosis not present

## 2018-10-06 NOTE — Telephone Encounter (Signed)
LMTCB. Can schedule appt with Dr. Nicki Reaper to discuss diabetic treatment.

## 2018-10-06 NOTE — Telephone Encounter (Signed)
Copied from [place] [phone]. Topic: General - Other >> Oct 06, 2018  9:37 AM Pauline Good wrote: Reason for CRM: pt need to speak to nurse concerning her diabetes and she has some questions. Pt was told they wanted to put her on medication and want to talk about this.

## 2018-10-07 ENCOUNTER — Other Ambulatory Visit: Payer: Self-pay

## 2018-10-07 ENCOUNTER — Encounter: Payer: Self-pay | Admitting: Internal Medicine

## 2018-10-07 ENCOUNTER — Ambulatory Visit (INDEPENDENT_AMBULATORY_CARE_PROVIDER_SITE_OTHER): Payer: PPO | Admitting: Internal Medicine

## 2018-10-07 DIAGNOSIS — Z8673 Personal history of transient ischemic attack (TIA), and cerebral infarction without residual deficits: Secondary | ICD-10-CM | POA: Diagnosis not present

## 2018-10-07 DIAGNOSIS — F439 Reaction to severe stress, unspecified: Secondary | ICD-10-CM

## 2018-10-07 DIAGNOSIS — E7800 Pure hypercholesterolemia, unspecified: Secondary | ICD-10-CM | POA: Diagnosis not present

## 2018-10-07 DIAGNOSIS — E1159 Type 2 diabetes mellitus with other circulatory complications: Secondary | ICD-10-CM

## 2018-10-07 MED ORDER — METFORMIN HCL ER 500 MG PO TB24: 500.0000 mg | ORAL_TABLET | Freq: Every day | ORAL | 2 refills | Status: DC

## 2018-10-07 NOTE — Progress Notes (Signed)
[name], female   DOB: 07/15/1950, 68 y.o.   MRN: [id number]   Virtual Visit via Telephone Note  This visit type was conducted due to national recommendations for restrictions regarding the COVID-19 pandemic (e.g. social distancing).  This format is felt to be most appropriate for this patient at this time.  All issues noted in this document were discussed and addressed.  No physical exam was performed (except for noted visual exam findings with Video Visits).   I connected with Victoria Harris by telephone and verified that I am speaking with the correct person using two identifiers. Location patient: home Location provider: work or home office Persons participating in the telephone visit: patient, provider  I discussed the limitations, risks, security and privacy concerns of performing an evaluation and management service by telephone and the availability of in person appointments.  The patient expressed understanding and agreed to proceed.   Reason for visit: acute visit.    HPI: Recently had labs and wanted to discuss her labs and my recommendation for her to start metformin.  Discussed her recent labs.  Discussed a1c and cholesterol results.  Discussed calculated cholesterol risk and my desire to start her on a cholesterol medication.  She declines.  Discussed elevated a1c, diet and exercise.  She agrees to start metformin.  Will start XR form given her GI history.  Blood sugar this am 155.  Recent range - 118-178.  Trying to stay active.  No chest pain.  No sob.  No acid reflux. No abdominal pain.  Bowels moving.  No urine change.  Trying to stay in due to COVID restrictions.  No fever.  No chest congestion, cough or sob.     ROS: See pertinent positives and negatives per HPI.  Past Medical History:  Diagnosis Date   Diverticulosis    Hypercholesterolemia    Serrated adenoma of colon    Stroke (cerebrum) (Victoria Harris)    Thrombocytopenia ([place])    Uterus  descensus     Past Surgical History:  Procedure Laterality Date   ABDOMINAL HYSTERECTOMY  2009   CHOLECYSTECTOMY  2007   COLONOSCOPY N/A 04/05/2015   Procedure: COLONOSCOPY;  Surgeon: Lollie Sails, MD;  Location[facility] ENDOSCOPY;  Service: Endoscopy;  Laterality: N/A;   COLONOSCOPY WITH PROPOFOL N/A 10/09/2016   Procedure: COLONOSCOPY WITH PROPOFOL;  Surgeon: Lollie Sails, MD;  Location: [facility] ENDOSCOPY;  Service: Endoscopy;  Laterality: N/A;   DILATION AND CURETTAGE OF UTERUS  2001 and 2006   FOOT SURGERY  2007   TONSILLECTOMY  1960   TUBAL LIGATION     vocal cord cyst removed      Family History  Problem Relation Age of Onset   Diabetes Other    Colon cancer Mother    Diabetes Mother    Kidney disease Mother    Heart disease Mother    Stroke Paternal Grandmother    Stroke Paternal Grandfather    Prostate cancer Father    Diabetes Father    Glaucoma Father    Breast cancer Paternal Aunt        x2   Uterine cancer Maternal Aunt    Diabetes Brother    Diabetes Brother     SOCIAL HX: reviewed.    Current Outpatient Medications:    aspirin 81 MG tablet, Take 81 mg by mouth daily., Disp: , Rfl:    Cholecalciferol (VITAMIN D-1000 MAX ST) 1000 units tablet, Take by mouth., Disp: , Rfl:  colestipol (COLESTID) 1 G tablet, Take 1 g by mouth as needed., Disp: , Rfl:    dorzolamide-timolol (COSOPT) 22.3-6.8 MG/ML ophthalmic solution, Place 1 drop into both eyes 2 (two) times daily., Disp: , Rfl: 5   Multiple Vitamins-Minerals (MULTIVITAMIN ADULT PO), Take by mouth daily., Disp: , Rfl:    Omega-3 Fatty Acids (FISH OIL) 1000 MG CAPS, Take by mouth daily., Disp: , Rfl:    Saccharomyces boulardii (FLORASTOR PO), Take by mouth daily as needed., Disp: , Rfl:    TRAVATAN Z 0.004 % SOLN ophthalmic solution, Place 1 drop into both eyes at bedtime., Disp: , Rfl:    metFORMIN (GLUCOPHAGE XR) 500 MG 24 hr tablet, Take 1 tablet (500 mg total) by mouth  daily with breakfast., Disp: 30 tablet, Rfl: 2  EXAM:  GENERAL: alert.  Answering questions appropriately.  Sounds to be in no acute distress.    PSYCH/NEURO: pleasant and cooperative, no obvious depression or anxiety, speech and thought processing grossly intact  ASSESSMENT AND PLAN:  Discussed the following assessment and plan:  Type 2 diabetes mellitus with vascular disease ([place])  Stress  Hypercholesteremia  History of CVA (cerebrovascular accident)  Type 2 diabetes mellitus with vascular disease ([place]) Has a history of CVA with diabetes.  Discussed recent elevated a1c.  Discussed low carb diet and exercise.  Discussed treatment options.  Start metformin XR 500mg  q day.  Will start XR form given her history of GI issues.  Follow sugars.  Send in readings.  Schedule f/u soon to reassess.    Stress Overall appears to be handling stress relatively well.  Some increased stress related to her sugars and starting medication.  Discussed with her today.  Does not feel needs any further intervention.  Follow.    Hypercholesteremia Discussed cholesterol labs and calculated cholesterol risk.  Discussed recommendation to start a cholesterol medication.  She declines.  Low cholesterol diet and exercise.  Follow lipid panel.   History of CVA (cerebrovascular accident) Continue daily aspirin.  Discussed need for risk factor modification.      I discussed the assessment and treatment plan with the patient. The patient was provided an opportunity to ask questions and all were answered. The patient agreed with the plan and demonstrated an understanding of the instructions.   The patient was advised to call back or seek an in-person evaluation if the symptoms worsen or if the condition fails to improve as anticipated.  I provided 25 minutes of non-face-to-face time during this encounter.   Einar Pheasant, MD

## 2018-10-12 ENCOUNTER — Encounter: Payer: Self-pay | Admitting: Internal Medicine

## 2018-10-12 NOTE — Assessment & Plan Note (Signed)
Continue daily aspirin.  Discussed need for risk factor modification.

## 2018-10-12 NOTE — Assessment & Plan Note (Signed)
Has a history of CVA with diabetes.  Discussed recent elevated a1c.  Discussed low carb diet and exercise.  Discussed treatment options.  Start metformin XR 500mg  q day.  Will start XR form given her history of GI issues.  Follow sugars.  Send in readings.  Schedule f/u soon to reassess.

## 2018-10-12 NOTE — Assessment & Plan Note (Signed)
Overall appears to be handling stress relatively well.  Some increased stress related to her sugars and starting medication.  Discussed with her today.  Does not feel needs any further intervention.  Follow.

## 2018-10-12 NOTE — Assessment & Plan Note (Signed)
Discussed cholesterol labs and calculated cholesterol risk.  Discussed recommendation to start a cholesterol medication.  She declines.  Low cholesterol diet and exercise.  Follow lipid panel.

## 2018-10-24 ENCOUNTER — Ambulatory Visit (INDEPENDENT_AMBULATORY_CARE_PROVIDER_SITE_OTHER): Payer: PPO | Admitting: Internal Medicine

## 2018-10-24 ENCOUNTER — Other Ambulatory Visit: Payer: Self-pay

## 2018-10-24 DIAGNOSIS — D696 Thrombocytopenia, unspecified: Secondary | ICD-10-CM | POA: Diagnosis not present

## 2018-10-24 DIAGNOSIS — E1159 Type 2 diabetes mellitus with other circulatory complications: Secondary | ICD-10-CM | POA: Diagnosis not present

## 2018-10-24 DIAGNOSIS — E7800 Pure hypercholesterolemia, unspecified: Secondary | ICD-10-CM

## 2018-10-24 DIAGNOSIS — F439 Reaction to severe stress, unspecified: Secondary | ICD-10-CM

## 2018-10-24 MED ORDER — METFORMIN HCL ER 500 MG PO TB24: 500.0000 mg | ORAL_TABLET | Freq: Every day | ORAL | 1 refills | Status: DC

## 2018-10-24 NOTE — Progress Notes (Signed)
[name], female   DOB: [birth year], 68 y.o.   MRN: [id number]   Virtual Visit via telephone Note  This visit type was conducted due to national recommendations for restrictions regarding the COVID-19 pandemic (e.g. social distancing).  This format is felt to be most appropriate for this patient at this time.  All issues noted in this document were discussed and addressed.  No physical exam was performed (except for noted visual exam findings with Video Visits).   I connected with [name] by telephone and verified that I am speaking with the correct person using two identifiers. Location patient: home Location provider: work  Persons participating in the telephone visit: patient, provider  I discussed the limitations, risks, security and privacy concerns of performing an evaluation and management service by telephone and the availability of in person appointments. The patient expressed understanding and agreed to proceed.   Reason for visit: scheduled follow up.    HPI: Scheduled follow up.  Was recently started on metformin for elevated blood sugars.  Tolerating well.  Sugars doing better.  She has adjusted her diet.  Has lost weight.  Feels better.  No GI issues with metformin.  Not as anxious now.  Trying to stay active.  No chest pain.  No sob. No acid reflux.  No abdominal pain.  Bowels moving.  Blood sugar this am 118.  Bedtime sugars 107-108.  Has lost weight.  Has declined cholesterol medication.  Watching carb intake.     ROS: See pertinent positives and negatives per HPI.  Past Medical History:  Diagnosis Date  . Diverticulosis   . Hypercholesterolemia   . Serrated adenoma of colon   . Stroke (cerebrum) ([place])   . Thrombocytopenia ([place])   . Uterus descensus     Past Surgical History:  Procedure Laterality Date  . ABDOMINAL HYSTERECTOMY  2009  . CHOLECYSTECTOMY  2007  . COLONOSCOPY N/A 04/05/2015   Procedure: COLONOSCOPY;  Surgeon: Lollie Sails, MD;  Location: [facility] ENDOSCOPY;  Service: Endoscopy;  Laterality: N/A;  . COLONOSCOPY WITH PROPOFOL N/A 10/09/2016   Procedure: COLONOSCOPY WITH PROPOFOL;  Surgeon: Lollie Sails, MD;  Location: [facility] ENDOSCOPY;  Service: Endoscopy;  Laterality: N/A;  . DILATION AND CURETTAGE OF UTERUS  2001 and 2006  . FOOT SURGERY  2007  . TONSILLECTOMY  1960  . TUBAL LIGATION    . vocal cord cyst removed      Family History  Problem Relation Age of Onset  . Diabetes Other   . Colon cancer Mother   . Diabetes Mother   . Kidney disease Mother   . Heart disease Mother   . Stroke Paternal Grandmother   . Stroke Paternal Grandfather   . Prostate cancer Father   . Diabetes Father   . Glaucoma Father   . Breast cancer Paternal Aunt        x2  . Uterine cancer Maternal Aunt   . Diabetes Brother   . Diabetes Brother     SOCIAL HX: reviewed.    Current Outpatient Medications:  .  aspirin 81 MG tablet, Take 81 mg by mouth daily., Disp: , Rfl:  .  Cholecalciferol (VITAMIN D-1000 MAX ST) 1000 units tablet, Take by mouth., Disp: , Rfl:  .  colestipol (COLESTID) 1 G tablet, Take 1 g by mouth as needed., Disp: , Rfl:  .  dorzolamide-timolol (COSOPT) 22.3-6.8 MG/ML ophthalmic solution, Place 1 drop into both eyes 2 (two) times daily., Disp: , Rfl:  5 .  metFORMIN (GLUCOPHAGE XR) 500 MG 24 hr tablet, Take 1 tablet (500 mg total) by mouth daily with breakfast., Disp: 90 tablet, Rfl: 1 .  Multiple Vitamins-Minerals (MULTIVITAMIN ADULT PO), Take by mouth daily., Disp: , Rfl:  .  Omega-3 Fatty Acids (FISH OIL) 1000 MG CAPS, Take by mouth daily., Disp: , Rfl:  .  Saccharomyces boulardii (FLORASTOR PO), Take by mouth daily as needed., Disp: , Rfl:  .  TRAVATAN Z 0.004 % SOLN ophthalmic solution, Place 1 drop into both eyes at bedtime., Disp: , Rfl:   EXAM:  VITALS per patient if applicable: weight today 177 pounds  GENERAL: alert.  Sounds to be in no acute distress.  Answering questions  appropriately.    PSYCH/NEURO: pleasant and cooperative, no obvious depression or anxiety, speech and thought processing grossly intact  ASSESSMENT AND PLAN:  Discussed the following assessment and plan:  Hypercholesteremia  Stress  Thrombocytopenia (H[facility])  Type 2 diabetes mellitus with vascular disease ([place])  Hypercholesteremia Have discussed calculated cholesterol risk.  She declines cholesterol medication.  Low cholesterol diet and exercise.  Follow lipid panel.   Stress Overall handling things better.  Has good support.  Follow.    Thrombocytopenia Follow cbc.    Type 2 diabetes mellitus with vascular disease ([place]) On metformin now.  Tolerating.  Sugars much improved.  She has adjusted her diet.  Lost weight.  Feels good.  Follow met b and a1c.      I discussed the assessment and treatment plan with the patient. The patient was provided an opportunity to ask questions and all were answered. The patient agreed with the plan and demonstrated an understanding of the instructions.   The patient was advised to call back or seek an in-person evaluation if the symptoms worsen or if the condition fails to improve as anticipated.  I provided 22 minutes of non-face-to-face time during this encounter.   Einar Pheasant, MD

## 2018-10-26 ENCOUNTER — Encounter: Payer: Self-pay | Admitting: Internal Medicine

## 2018-10-26 NOTE — Assessment & Plan Note (Signed)
Have discussed calculated cholesterol risk.  She declines cholesterol medication.  Low cholesterol diet and exercise.  Follow lipid panel.

## 2018-10-26 NOTE — Assessment & Plan Note (Signed)
On metformin now.  Tolerating.  Sugars much improved.  She has adjusted her diet.  Lost weight.  Feels good.  Follow met b and a1c.

## 2018-10-26 NOTE — Assessment & Plan Note (Signed)
Overall handling things better.  Has good support.  Follow.

## 2018-10-26 NOTE — Assessment & Plan Note (Signed)
Follow cbc.  

## 2018-10-28 ENCOUNTER — Telehealth: Payer: Self-pay | Admitting: *Deleted

## 2018-10-28 NOTE — Telephone Encounter (Signed)
Pt called and scheduled

## 2018-10-28 NOTE — Telephone Encounter (Signed)
Copied from [place] [phone]. Topic: Appointment Scheduling - Scheduling Inquiry for Clinic >> Oct 27, 2018  4:28 PM Celene Kras A wrote: Reason for CRM: Pt called requesting to set up her appt for labs close to July 21. Please advise.

## 2018-12-01 ENCOUNTER — Ambulatory Visit
Admission: RE | Admit: 2018-12-01 | Discharge: 2018-12-01 | Disposition: A | Discharge status: Home / Self Care | Payer: PPO | Source: Ambulatory Visit | Attending: Internal Medicine | Admitting: Internal Medicine

## 2018-12-01 ENCOUNTER — Other Ambulatory Visit: Payer: Self-pay

## 2018-12-01 DIAGNOSIS — Z1231 Encounter for screening mammogram for malignant neoplasm of breast: Secondary | ICD-10-CM | POA: Diagnosis not present

## 2018-12-15 ENCOUNTER — Telehealth: Payer: Self-pay | Admitting: *Deleted

## 2018-12-15 DIAGNOSIS — E7800 Pure hypercholesterolemia, unspecified: Secondary | ICD-10-CM

## 2018-12-15 DIAGNOSIS — E1159 Type 2 diabetes mellitus with other circulatory complications: Secondary | ICD-10-CM

## 2018-12-15 NOTE — Telephone Encounter (Signed)
Please place future orders for lab appt.  

## 2018-12-16 ENCOUNTER — Other Ambulatory Visit (INDEPENDENT_AMBULATORY_CARE_PROVIDER_SITE_OTHER): Payer: PPO

## 2018-12-16 ENCOUNTER — Encounter: Payer: Self-pay | Admitting: Internal Medicine

## 2018-12-16 ENCOUNTER — Other Ambulatory Visit: Payer: Self-pay

## 2018-12-16 DIAGNOSIS — E1159 Type 2 diabetes mellitus with other circulatory complications: Secondary | ICD-10-CM | POA: Diagnosis not present

## 2018-12-16 DIAGNOSIS — E7800 Pure hypercholesterolemia, unspecified: Secondary | ICD-10-CM

## 2018-12-16 LAB — MICROALBUMIN / CREATININE URINE RATIO
Creatinine,U: 127.4 mg/dL
Microalb Creat Ratio: 0.8 mg/g (ref 0.0–30.0)
Microalb, Ur: 1 mg/dL (ref 0.0–1.9)

## 2018-12-16 LAB — LIPID PANEL
Cholesterol: 192 mg/dL (ref 0–200)
HDL: 55.8 mg/dL (ref >39.00)
LDL Cholesterol: 102 mg/dL — ABNORMAL HIGH (ref 0–99)
NonHDL: 136.4
Total CHOL/HDL Ratio: 3
Triglycerides: 172 mg/dL — ABNORMAL HIGH (ref 0.0–149.0)
VLDL: 34.4 mg/dL (ref 0.0–40.0)

## 2018-12-16 LAB — HEPATIC FUNCTION PANEL
ALT: 25 U/L (ref 0–35)
AST: 23 U/L (ref 0–37)
Albumin: 4.4 g/dL (ref 3.5–5.2)
Alkaline Phosphatase: 49 U/L (ref 39–117)
Bilirubin, Direct: 0.1 mg/dL (ref 0.0–0.3)
Total Bilirubin: 0.8 mg/dL (ref 0.2–1.2)
Total Protein: 6 g/dL (ref 6.0–8.3)

## 2018-12-16 LAB — BASIC METABOLIC PANEL
BUN: 16 mg/dL (ref 6–23)
CO2: 27 mEq/L (ref 19–32)
Calcium: 9.1 mg/dL (ref 8.4–10.5)
Chloride: 108 mEq/L (ref 96–112)
Creatinine, Ser: 0.63 mg/dL (ref 0.40–1.20)
GFR: 93.67 mL/min (ref >60.00)
Glucose, Bld: 123 mg/dL — ABNORMAL HIGH (ref 70–99)
Potassium: 4 mEq/L (ref 3.5–5.1)
Sodium: 141 mEq/L (ref 135–145)

## 2018-12-16 LAB — HEMOGLOBIN A1C: Hgb A1c MFr Bld: 5.9 % (ref 4.6–6.5)

## 2018-12-16 NOTE — Telephone Encounter (Signed)
Orders placed for f/u labs.  

## 2018-12-18 ENCOUNTER — Other Ambulatory Visit: Payer: Self-pay

## 2018-12-18 ENCOUNTER — Telehealth: Payer: Self-pay

## 2018-12-18 MED ORDER — LANCETS MISC: 11 refills | Status: DC

## 2018-12-18 MED ORDER — GLUCOSE BLOOD VI STRP: ORAL_STRIP | 12 refills | Status: DC

## 2018-12-18 NOTE — Telephone Encounter (Signed)
Left message for pt to call back. Need to let her know that I have glucometer and her most recent labs ready for pick up. Also need to know which pharmacy she would like glucometer supplies sent in

## 2018-12-18 NOTE — Telephone Encounter (Signed)
Pt picked up glucometer and labs. Will send supplies to [facility]

## 2018-12-18 NOTE — Telephone Encounter (Signed)
Patient returned Victoria Harris call and is asking for a callback

## 2018-12-22 ENCOUNTER — Ambulatory Visit: Payer: PPO | Admitting: Podiatry

## 2018-12-22 ENCOUNTER — Encounter: Payer: Self-pay | Admitting: Podiatry

## 2018-12-22 ENCOUNTER — Other Ambulatory Visit: Payer: Self-pay

## 2018-12-22 VITALS — Temp 97.5°F

## 2018-12-22 DIAGNOSIS — B351 Tinea unguium: Secondary | ICD-10-CM | POA: Diagnosis not present

## 2018-12-22 DIAGNOSIS — M79674 Pain in right toe(s): Secondary | ICD-10-CM

## 2018-12-22 DIAGNOSIS — E119 Type 2 diabetes mellitus without complications: Secondary | ICD-10-CM | POA: Diagnosis not present

## 2018-12-22 NOTE — Progress Notes (Signed)
Complaint:  Visit Type: Patient returns to my office for continued preventative foot care services. Complaint: Patient states" my nails have grown long and thick and become painful to walk and wear shoes" Patient has been diagnosed with DM with no foot complications. The patient presents for preventative foot care services. No changes to ROS  Podiatric Exam: Vascular: dorsalis pedis and posterior tibial pulses are palpable bilateral. Capillary return is immediate. Temperature gradient is WNL. Skin turgor WNL  Sensorium: Normal Semmes Weinstein monofilament test. Normal tactile sensation bilaterally. Nail Exam: Pt has thick disfigured discolored nails with subungual debris noted hallux nail  Right.  Pincer nails 2,3 right foot. Ulcer Exam: There is no evidence of ulcer or pre-ulcerative changes or infection. Orthopedic Exam: Muscle tone and strength are WNL. No limitations in general ROM. No crepitus or effusions noted. Foot type and digits show no abnormalities. HAV  B/L.  Midfoot arthritis  B/L. Skin: No Porokeratosis. No infection or ulcers  Diagnosis:  Onychomycosis, , Pain in right toe,   Treatment & Plan Procedures and Treatment: Consent by patient was obtained for treatment procedures.   Debridement of mycotic and hypertrophic toenails, 1 through 5 bilateral and clearing of subungual debris. No ulceration, no infection noted.  Return Visit-Office Procedure: Patient instructed to return to the office for a follow up visit 3 months for continued evaluation and treatment.    Gardiner Barefoot DPM

## 2018-12-30 ENCOUNTER — Ambulatory Visit (INDEPENDENT_AMBULATORY_CARE_PROVIDER_SITE_OTHER): Payer: PPO | Admitting: Internal Medicine

## 2018-12-30 ENCOUNTER — Other Ambulatory Visit: Payer: Self-pay

## 2018-12-30 ENCOUNTER — Encounter: Payer: Self-pay | Admitting: Internal Medicine

## 2018-12-30 DIAGNOSIS — E7800 Pure hypercholesterolemia, unspecified: Secondary | ICD-10-CM | POA: Diagnosis not present

## 2018-12-30 DIAGNOSIS — E1159 Type 2 diabetes mellitus with other circulatory complications: Secondary | ICD-10-CM | POA: Diagnosis not present

## 2018-12-30 DIAGNOSIS — D696 Thrombocytopenia, unspecified: Secondary | ICD-10-CM | POA: Diagnosis not present

## 2018-12-30 NOTE — Progress Notes (Signed)
[name], female   DOB: January 13, 1951, 68 y.o.   MRN: [id number]   Virtual Visit via telephone Note  This visit type was conducted due to national recommendations for restrictions regarding the COVID-19 pandemic (e.g. social distancing).  This format is felt to be most appropriate for this patient at this time.  All issues noted in this document were discussed and addressed.  No physical exam was performed (except for noted visual exam findings with Video Visits).   I connected with [name] by telephone and verified that I am speaking with the correct person using two identifiers. Location patient: home Location provider: work Persons participating in the telephone visit: patient, provider  I discussed the limitations, risks, security and privacy concerns of performing an evaluation and management service by telephone and the availability of in person appointments. The patient expressed understanding and agreed to proceed.   Reason for visit: scheduled follow up  HPI: She reports she is doing well.  Feels good.  Stays active.  Has been gardening.  Has adjusted her diet and lost weight.  Sugars improved.  Was previously started on metformin.  Recent sugars 90-107.  Discussed stopping metformin.  a1c 5.9.  No chest pain.  No sob.  No acid reflux.  No abdominal pain.  Bowels moving.  Overall feels good.     ROS: See pertinent positives and negatives per HPI.  Past Medical History:  Diagnosis Date  . Diverticulosis   . Hypercholesterolemia   . Serrated adenoma of colon   . Stroke (cerebrum) ([place])   . Thrombocytopenia ([place])   . Uterus descensus     Past Surgical History:  Procedure Laterality Date  . ABDOMINAL HYSTERECTOMY  2009  . CHOLECYSTECTOMY  2007  . COLONOSCOPY N/A 04/05/2015   Procedure: COLONOSCOPY;  Surgeon: Lollie Sails, MD;  Location: [facility] ENDOSCOPY;  Service: Endoscopy;  Laterality: N/A;  . COLONOSCOPY WITH PROPOFOL N/A 10/09/2016   Procedure: COLONOSCOPY WITH PROPOFOL;  Surgeon: Lollie Sails, MD;  Location[facility] ENDOSCOPY;  Service: Endoscopy;  Laterality: N/A;  . DILATION AND CURETTAGE OF UTERUS  2001 and 2006  . FOOT SURGERY  2007  . TONSILLECTOMY  1960  . TUBAL LIGATION    . vocal cord cyst removed      Family History  Problem Relation Age of Onset  . Diabetes Other   . Colon cancer Mother   . Diabetes Mother   . Kidney disease Mother   . Heart disease Mother   . Stroke Paternal Grandmother   . Stroke Paternal Grandfather   . Prostate cancer Father   . Diabetes Father   . Glaucoma Father   . Breast cancer Paternal Aunt        x2  . Uterine cancer Maternal Aunt   . Diabetes Brother   . Diabetes Brother     SOCIAL HX: reviewed.    Current Outpatient Medications:  .  aspirin 81 MG tablet, Take 81 mg by mouth daily., Disp: , Rfl:  .  Cholecalciferol (VITAMIN D-1000 MAX ST) 1000 units tablet, Take by mouth., Disp: , Rfl:  .  colestipol (COLESTID) 1 g tablet, TAKE 1 TABLET BY MOUTH ONCE DAILY, Disp: , Rfl:  .  dorzolamide-timolol (COSOPT) 22.3-6.8 MG/ML ophthalmic solution, Place 1 drop into both eyes 2 (two) times daily., Disp: , Rfl: 5 .  glucose blood test strip, Use as instructed to check blood sugars daily. Dx e11.9, Disp: 100 each, Rfl: 12 .  Lancets MISC, Use  as directed to check blood sugars daily. Dx e11.9, Disp: 100 each, Rfl: 11 .  metFORMIN (GLUCOPHAGE XR) 500 MG 24 hr tablet, Take 1 tablet (500 mg total) by mouth daily with breakfast., Disp: 90 tablet, Rfl: 1 .  Multiple Vitamins-Minerals (MULTIVITAMIN ADULT PO), Take by mouth daily., Disp: , Rfl:  .  Omega-3 Fatty Acids (FISH OIL) 1000 MG CAPS, Take by mouth daily., Disp: , Rfl:  .  Saccharomyces boulardii (FLORASTOR PO), Take by mouth daily as needed., Disp: , Rfl:  .  TRAVATAN Z 0.004 % SOLN ophthalmic solution, Place 1 drop into both eyes at bedtime., Disp: , Rfl:   EXAM:  GENERAL: alert.  Answering questions appropriately.   Sounds to be in no acute distress.    PSYCH/NEURO: pleasant and cooperative, no obvious depression or anxiety, speech and thought processing grossly intact  ASSESSMENT AND PLAN:  Discussed the following assessment and plan:  Hypercholesteremia Have discussed calculated cholesterol risk.  Discussed recommendation to start cholesterol medication.  She declines.  Low cholesterol diet and exercise.  Follow lipid panel.    Thrombocytopenia Platelet count on last check wnl.  Follow.    Type 2 diabetes mellitus with vascular disease ([place]) She has adjusted her diet.  Lost weight.  Sugars much improved as outlined.  Stop metformin.  Follow sugars.  Follow metabolic panel.      I discussed the assessment and treatment plan with the patient. The patient was provided an opportunity to ask questions and all were answered. The patient agreed with the plan and demonstrated an understanding of the instructions.   The patient was advised to call back or seek an in-person evaluation if the symptoms worsen or if the condition fails to improve as anticipated.  I provided 18 minutes of non-face-to-face time during this encounter.   Einar Pheasant, MD

## 2019-01-04 NOTE — Assessment & Plan Note (Signed)
She has adjusted her diet.  Lost weight.  Sugars much improved as outlined.  Stop metformin.  Follow sugars.  Follow metabolic panel.

## 2019-01-04 NOTE — Assessment & Plan Note (Signed)
Platelet count on last check wnl.  Follow.

## 2019-01-04 NOTE — Assessment & Plan Note (Signed)
Have discussed calculated cholesterol risk.  Discussed recommendation to start cholesterol medication. She declines.  Low cholesterol diet and exercise.  Follow lipid panel.  

## 2019-01-08 ENCOUNTER — Telehealth: Payer: Self-pay | Admitting: Internal Medicine

## 2019-01-08 NOTE — Telephone Encounter (Signed)
If sugars are doing well, I recommend remaining off the metformin and continue to monitor.

## 2019-01-08 NOTE — Telephone Encounter (Signed)
LMTCB

## 2019-01-08 NOTE — Telephone Encounter (Signed)
Patient calling to update PCP after a week of being off of Metformin. Patient states she is doing well.   She states it has been staying in the low 100's since one day after stopping. This morning she reports it is 80.   She is requesting a call back from CMA to discuss further is needed.

## 2019-01-09 NOTE — Telephone Encounter (Signed)
Patient aware.

## 2019-01-09 NOTE — Telephone Encounter (Signed)
Pt calling back. Please call back.    Please call cell   Cb#[phone]

## 2019-02-23 ENCOUNTER — Telehealth: Payer: Self-pay

## 2019-02-23 NOTE — Telephone Encounter (Signed)
Copied from [place] [phone]. Topic: Referral - Question >> Feb 23, 2019  9:41 AM Percell Belt A wrote: Reason for CRM: Darlene with [facility].  They are needing something fax over to them that states that this pt has Type 2 diabetes mellitus with vascular disease ([place]) /   Fax number [phone]

## 2019-02-26 NOTE — Telephone Encounter (Signed)
Faxed office note. 

## 2019-03-19 ENCOUNTER — Ambulatory Visit: Payer: PPO | Admitting: Podiatry

## 2019-03-19 ENCOUNTER — Other Ambulatory Visit: Payer: Self-pay

## 2019-03-19 ENCOUNTER — Encounter: Payer: Self-pay | Admitting: Podiatry

## 2019-03-19 DIAGNOSIS — B351 Tinea unguium: Secondary | ICD-10-CM

## 2019-03-19 DIAGNOSIS — E119 Type 2 diabetes mellitus without complications: Secondary | ICD-10-CM

## 2019-03-19 DIAGNOSIS — M79674 Pain in right toe(s): Secondary | ICD-10-CM

## 2019-03-19 NOTE — Progress Notes (Signed)
Complaint:  Visit Type: Patient returns to my office for continued preventative foot care services. Complaint: Patient states" my nails have grown long and thick and become painful to walk and wear shoes" Patient has been diagnosed with DM with no foot complications. The patient presents for preventative foot care services. No changes to ROS  Podiatric Exam: Vascular: dorsalis pedis and posterior tibial pulses are palpable bilateral. Capillary return is immediate. Temperature gradient is WNL. Skin turgor WNL  Sensorium: Normal Semmes Weinstein monofilament test. Normal tactile sensation bilaterally. Nail Exam: Pt has thick disfigured discolored nails with subungual debris noted hallux nail  Right.  Pincer nails 2,3 right foot. Ulcer Exam: There is no evidence of ulcer or pre-ulcerative changes or infection. Orthopedic Exam: Muscle tone and strength are WNL. No limitations in general ROM. No crepitus or effusions noted. Foot type and digits show no abnormalities. HAV  B/L.  Midfoot arthritis  B/L.  Plantar flexed second metatarsal right foot. Skin: No Porokeratosis. No infection or ulcers  Diagnosis:  Onychomycosis, , Pain in right toe,   Treatment & Plan Procedures and Treatment: Consent by patient was obtained for treatment procedures.   Debridement of mycotic and hypertrophic toenails, 1 through 5 bilateral and clearing of subungual debris. No ulceration, no infection noted.  Return Visit-Office Procedure: Patient instructed to return to the office for a follow up visit 3 months for continued evaluation and treatment.    Gardiner Barefoot DPM

## 2019-04-02 DIAGNOSIS — H401122 Primary open-angle glaucoma, left eye, moderate stage: Secondary | ICD-10-CM | POA: Diagnosis not present

## 2019-04-09 DIAGNOSIS — H401111 Primary open-angle glaucoma, right eye, mild stage: Secondary | ICD-10-CM | POA: Diagnosis not present

## 2019-04-09 LAB — HM DIABETES EYE EXAM

## 2019-04-14 ENCOUNTER — Telehealth: Payer: Self-pay

## 2019-04-14 DIAGNOSIS — E1159 Type 2 diabetes mellitus with other circulatory complications: Secondary | ICD-10-CM

## 2019-04-14 DIAGNOSIS — E7800 Pure hypercholesterolemia, unspecified: Secondary | ICD-10-CM

## 2019-04-14 NOTE — Telephone Encounter (Signed)
Please place future orders for patient's upcoming appointment. Thanks

## 2019-04-14 NOTE — Telephone Encounter (Signed)
Orders placed for labs

## 2019-04-16 ENCOUNTER — Encounter: Payer: Self-pay | Admitting: Internal Medicine

## 2019-04-20 ENCOUNTER — Other Ambulatory Visit (INDEPENDENT_AMBULATORY_CARE_PROVIDER_SITE_OTHER): Payer: PPO

## 2019-04-20 ENCOUNTER — Other Ambulatory Visit: Payer: Self-pay

## 2019-04-20 DIAGNOSIS — E7800 Pure hypercholesterolemia, unspecified: Secondary | ICD-10-CM

## 2019-04-20 DIAGNOSIS — E1159 Type 2 diabetes mellitus with other circulatory complications: Secondary | ICD-10-CM | POA: Diagnosis not present

## 2019-04-20 LAB — LIPID PANEL
Cholesterol: 207 mg/dL — ABNORMAL HIGH (ref 0–200)
HDL: 63.1 mg/dL (ref >39.00)
LDL Cholesterol: 113 mg/dL — ABNORMAL HIGH (ref 0–99)
NonHDL: 143.82
Total CHOL/HDL Ratio: 3
Triglycerides: 154 mg/dL — ABNORMAL HIGH (ref 0.0–149.0)
VLDL: 30.8 mg/dL (ref 0.0–40.0)

## 2019-04-20 LAB — CBC WITH DIFFERENTIAL/PLATELET
Basophils Absolute: 0 10*3/uL (ref 0.0–0.1)
Basophils Relative: 0.5 % (ref 0.0–3.0)
Eosinophils Absolute: 0.1 10*3/uL (ref 0.0–0.7)
Eosinophils Relative: 1.4 % (ref 0.0–5.0)
HCT: 43.3 % (ref 36.0–46.0)
Hemoglobin: 14.9 g/dL (ref 12.0–15.0)
Lymphocytes Relative: 28 % (ref 12.0–46.0)
Lymphs Abs: 1.6 10*3/uL (ref 0.7–4.0)
MCHC: 34.5 g/dL (ref 30.0–36.0)
MCV: 94.3 fl (ref 78.0–100.0)
Monocytes Absolute: 0.4 10*3/uL (ref 0.1–1.0)
Monocytes Relative: 6.1 % (ref 3.0–12.0)
Neutro Abs: 3.7 10*3/uL (ref 1.4–7.7)
Neutrophils Relative %: 64 % (ref 43.0–77.0)
Platelets: 158 10*3/uL (ref 150.0–400.0)
RBC: 4.6 Mil/uL (ref 3.87–5.11)
RDW: 13.4 % (ref 11.5–15.5)
WBC: 5.8 10*3/uL (ref 4.0–10.5)

## 2019-04-20 LAB — HEMOGLOBIN A1C: Hgb A1c MFr Bld: 6 % (ref 4.6–6.5)

## 2019-04-20 LAB — HEPATIC FUNCTION PANEL
ALT: 23 U/L (ref 0–35)
AST: 22 U/L (ref 0–37)
Albumin: 3.9 g/dL (ref 3.5–5.2)
Alkaline Phosphatase: 58 U/L (ref 39–117)
Bilirubin, Direct: 0.1 mg/dL (ref 0.0–0.3)
Total Bilirubin: 0.8 mg/dL (ref 0.2–1.2)
Total Protein: 6.5 g/dL (ref 6.0–8.3)

## 2019-04-20 LAB — BASIC METABOLIC PANEL
BUN: 17 mg/dL (ref 6–23)
CO2: 28 mEq/L (ref 19–32)
Calcium: 9.4 mg/dL (ref 8.4–10.5)
Chloride: 107 mEq/L (ref 96–112)
Creatinine, Ser: 0.6 mg/dL (ref 0.40–1.20)
GFR: 99 mL/min (ref >60.00)
Glucose, Bld: 132 mg/dL — ABNORMAL HIGH (ref 70–99)
Potassium: 4.2 mEq/L (ref 3.5–5.1)
Sodium: 143 mEq/L (ref 135–145)

## 2019-04-21 ENCOUNTER — Ambulatory Visit (INDEPENDENT_AMBULATORY_CARE_PROVIDER_SITE_OTHER): Payer: PPO | Admitting: Internal Medicine

## 2019-04-21 ENCOUNTER — Ambulatory Visit (INDEPENDENT_AMBULATORY_CARE_PROVIDER_SITE_OTHER): Payer: PPO

## 2019-04-21 ENCOUNTER — Encounter: Payer: Self-pay | Admitting: Internal Medicine

## 2019-04-21 DIAGNOSIS — Z8673 Personal history of transient ischemic attack (TIA), and cerebral infarction without residual deficits: Secondary | ICD-10-CM

## 2019-04-21 DIAGNOSIS — Z0000 Encounter for general adult medical examination without abnormal findings: Secondary | ICD-10-CM | POA: Diagnosis not present

## 2019-04-21 DIAGNOSIS — E7800 Pure hypercholesterolemia, unspecified: Secondary | ICD-10-CM

## 2019-04-21 DIAGNOSIS — F439 Reaction to severe stress, unspecified: Secondary | ICD-10-CM | POA: Diagnosis not present

## 2019-04-21 DIAGNOSIS — E1159 Type 2 diabetes mellitus with other circulatory complications: Secondary | ICD-10-CM | POA: Diagnosis not present

## 2019-04-21 DIAGNOSIS — D696 Thrombocytopenia, unspecified: Secondary | ICD-10-CM

## 2019-04-21 DIAGNOSIS — Z23 Encounter for immunization: Secondary | ICD-10-CM | POA: Diagnosis not present

## 2019-04-21 LAB — HM DIABETES FOOT EXAM

## 2019-04-21 NOTE — Assessment & Plan Note (Signed)
Low carb diet and exercise.  a1c 6.0.  She has adjusted her diet.  Lost weight.  Follow met b and a1c.

## 2019-04-21 NOTE — Assessment & Plan Note (Signed)
Recent platelet count wnl.  

## 2019-04-21 NOTE — Progress Notes (Addendum)
[name], female   DOB: 10/02/1950, 68 y.o.   MRN: [id number]   Subjective:    [name], female    DOB: Oct 11, 1950, 68 y.o.   MRN: [id number]  HPI This visit occurred during the SARS-CoV-2 public health emergency.  Safety protocols were in place, including screening questions prior to the visit, additional usage of staff PPE, and extensive cleaning of exam room while observing appropriate contact time as indicated for disinfecting solutions.  Patient here for a scheduled follow up. She reports she is doing well.  Feels good.  Trying to stay active.  No chest pain.  No sob.  No acid reflux.  No abdominal pain.  Bowels moving.  Handling stress.  Has adjusted her diet.  Maintaining her weight loss.  Sugars improved.  Discussed recent labs.  Discussed calculated cholesterol risk.  She continues to decline cholesterol medication.    Past Medical History:  Diagnosis Date  . Diverticulosis   . Hypercholesterolemia   . Serrated adenoma of colon   . Stroke (cerebrum) ([place])   . Thrombocytopenia ([place])   . Uterus descensus    Past Surgical History:  Procedure Laterality Date  . ABDOMINAL HYSTERECTOMY  2009  . CHOLECYSTECTOMY  2007  . COLONOSCOPY N/A 04/05/2015   Procedure: COLONOSCOPY;  Surgeon: Lollie Sails, MD;  Location: [facility] ENDOSCOPY;  Service: Endoscopy;  Laterality: N/A;  . COLONOSCOPY WITH PROPOFOL N/A 10/09/2016   Procedure: COLONOSCOPY WITH PROPOFOL;  Surgeon: Lollie Sails, MD;  Location[facility] ENDOSCOPY;  Service: Endoscopy;  Laterality: N/A;  . DILATION AND CURETTAGE OF UTERUS  2001 and 2006  . FOOT SURGERY  2007  . TONSILLECTOMY  1960  . TUBAL LIGATION    . vocal cord cyst removed     Family History  Problem Relation Age of Onset  . Diabetes Other   . Colon cancer Mother   . Diabetes Mother   . Kidney disease Mother   . Heart disease Mother   . Stroke Paternal Grandmother   . Stroke Paternal Grandfather   . Prostate  cancer Father   . Diabetes Father   . Glaucoma Father   . Breast cancer Paternal Aunt        x2  . Uterine cancer Maternal Aunt   . Diabetes Brother   . Diabetes Brother    Social History   Socioeconomic History  . Marital status: Married    Spouse name: Not on file  . Number of children: Not on file  . Years of education: Not on file  . Highest education level: Not on file  Occupational History  . Not on file  Social Needs  . Financial resource strain: Not hard at all  . Food insecurity    Worry: Never true    Inability: Never true  . Transportation needs    Medical: No    Non-medical: No  Tobacco Use  . Smoking status: Never Smoker  . Smokeless tobacco: Never Used  Substance and Sexual Activity  . Alcohol use: No    Alcohol/week: 0.0 standard drinks  . Drug use: No  . Sexual activity: Never  Lifestyle  . Physical activity    Days per week: 3 days    Minutes per session: 20 min  . Stress: Not at all  Relationships  . Social Herbalist on phone: Not on file    Gets together: Not on file    Attends religious service: Not on  file    Active member of club or organization: Not on file    Attends meetings of clubs or organizations: Not on file    Relationship status: Married  Other Topics Concern  . Not on file  Social History Narrative  . Not on file    Outpatient Encounter Medications as of 04/21/2019  Medication Sig  . aspirin 81 MG tablet Take 81 mg by mouth daily.  . Cholecalciferol (VITAMIN D-1000 MAX ST) 1000 units tablet Take by mouth.  . colestipol (COLESTID) 1 g tablet TAKE 1 TABLET BY MOUTH ONCE DAILY  . dorzolamide-timolol (COSOPT) 22.3-6.8 MG/ML ophthalmic solution Place 1 drop into both eyes 2 (two) times daily.  Marland Kitchen glucose blood test strip Use as instructed to check blood sugars daily. Dx e11.9  . Lancets MISC Use as directed to check blood sugars daily. Dx e11.9  . Multiple Vitamins-Minerals (MULTIVITAMIN ADULT PO) Take by mouth daily.   . Omega-3 Fatty Acids (FISH OIL) 1000 MG CAPS Take by mouth daily.  . Saccharomyces boulardii (FLORASTOR PO) Take by mouth daily as needed.  . TRAVATAN Z 0.004 % SOLN ophthalmic solution Place 1 drop into both eyes at bedtime.  . [DISCONTINUED] metFORMIN (GLUCOPHAGE XR) 500 MG 24 hr tablet Take 1 tablet (500 mg total) by mouth daily with breakfast. (Patient not taking: Reported on 04/21/2019)   No facility-administered encounter medications on file as of 04/21/2019.    Review of Systems  Constitutional: Negative for appetite change and unexpected weight change.  HENT: Negative for congestion and sinus pressure.   Respiratory: Negative for cough, chest tightness and shortness of breath.   Cardiovascular: Negative for chest pain, palpitations and leg swelling.  Gastrointestinal: Negative for abdominal pain, diarrhea, nausea and vomiting.  Genitourinary: Negative for difficulty urinating and dysuria.  Musculoskeletal: Negative for joint swelling and myalgias.  Skin: Negative for color change and rash.  Neurological: Negative for dizziness, light-headedness and headaches.  Psychiatric/Behavioral: Negative for agitation and dysphoric mood.       Objective:    Physical Exam Constitutional:      General: She is not in acute distress.    Appearance: Normal appearance.  HENT:     Head: Normocephalic and atraumatic.     Right Ear: External ear normal.     Left Ear: External ear normal.  Eyes:     General: No scleral icterus.       Right eye: No discharge.        Left eye: No discharge.     Conjunctiva/sclera: Conjunctivae normal.  Neck:     Musculoskeletal: Neck supple. No muscular tenderness.     Thyroid: No thyromegaly.  Cardiovascular:     Rate and Rhythm: Normal rate and regular rhythm.  Pulmonary:     Effort: No respiratory distress.     Breath sounds: Normal breath sounds. No wheezing.  Abdominal:     General: Bowel sounds are normal.     Palpations: Abdomen is soft.      Tenderness: There is no abdominal tenderness.  Musculoskeletal:        General: No swelling or tenderness.  Lymphadenopathy:     Cervical: No cervical adenopathy.  Skin:    Findings: No erythema or rash.  Neurological:     Mental Status: She is alert.  Psychiatric:        Mood and Affect: Mood normal.        Behavior: Behavior normal.     BP 126/78   Pulse 62  Temp (!) 96.8 F (36 C)   Resp 16   Wt 170 lb 12.8 oz (77.5 kg)   SpO2 98%   BMI 28.42 kg/m  Wt Readings from Last 3 Encounters:  04/21/19 170 lb 12.8 oz (77.5 kg)  12/30/18 170 lb 8 oz (77.3 kg)  08/19/18 183 lb 3.2 oz (83.1 kg)     Lab Results  Component Value Date   WBC 5.8 04/20/2019   HGB 14.9 04/20/2019   HCT 43.3 04/20/2019   PLT 158.0 04/20/2019   GLUCOSE 132 (H) 04/20/2019   CHOL 207 (H) 04/20/2019   TRIG 154.0 (H) 04/20/2019   HDL 63.10 04/20/2019   LDLCALC 113 (H) 04/20/2019   ALT 23 04/20/2019   AST 22 04/20/2019   NA 143 04/20/2019   K 4.2 04/20/2019   CL 107 04/20/2019   CREATININE 0.60 04/20/2019   BUN 17 04/20/2019   CO2 28 04/20/2019   TSH 1.37 08/19/2018   HGBA1C 6.0 04/20/2019   MICROALBUR 1.0 12/16/2018    Mm 3d Screen Breast Bilateral  Result Date: 12/01/2018 CLINICAL DATA:  Screening. EXAM: DIGITAL SCREENING BILATERAL MAMMOGRAM WITH TOMO AND CAD COMPARISON:  Previous exam(s). ACR Breast Density Category b: There are scattered areas of fibroglandular density. FINDINGS: There are no findings suspicious for malignancy. Images were processed with CAD. IMPRESSION: No mammographic evidence of malignancy. A result letter of this screening mammogram will be mailed directly to the patient. RECOMMENDATION: Screening mammogram in one year. (Code:SM-B-01Y) BI-RADS CATEGORY  1: Negative. Electronically Signed   By: Lillia Mountain M.D.   On: 12/01/2018 12:38       Assessment & Plan:   Problem List Items Addressed This Visit    History of CVA (cerebrovascular accident)    On aspirin.  Doing  well.        Hypercholesteremia    Discussed calculated cholesterol risk.  Low cholesterol diet and exercise.  She declines cholesterol medication.  Follow lipid panel and liver function tests.        Relevant Orders   TSH   Lipid panel   Hepatic function panel   Stress    Overall doing well.  Follow.        Thrombocytopenia (HCC)    Recent platelet count wnl.        Type 2 diabetes mellitus with vascular disease (HCC)    Low carb diet and exercise.  a1c 6.0.  She has adjusted her diet.  Lost weight.  Follow met b and a1c.       Relevant Orders   Hemoglobin W3S   Basic metabolic panel (future)    Other Visit Diagnoses    Need for immunization against influenza       Relevant Orders   Flu Vaccine QUAD High Dose(Fluad) (Completed)       Einar Pheasant, MD

## 2019-04-21 NOTE — Patient Instructions (Addendum)
  Victoria Harris , Thank you for taking time to come for your Medicare Wellness Visit. I appreciate your ongoing commitment to your health goals. Please review the following plan we discussed and let me know if I can assist you in the future.   These are the goals we discussed: Goals    . Increase physical activity     Walk for exercise       This is a list of the screening recommended for you and due dates:  Health Maintenance  Topic Date Due  . Tetanus Vaccine  11/13/1968  . DEXA scan (bone density measurement)  11/14/2014  . Flu Shot  08/26/2019*  . Complete foot exam   09/22/2019  . Hemoglobin A1C  10/18/2019  . Mammogram  12/01/2019  . Urine Protein Check  12/16/2019  . Eye exam for diabetics  04/08/2020  . Colon Cancer Screening  10/10/2026  .  Hepatitis C: One time screening is recommended by Center for Disease Control  (CDC) for  adults born from [birth year] through 1966.   Completed  . Pneumonia vaccines  Completed  *Topic was postponed. The date shown is not the original due date.

## 2019-04-21 NOTE — Progress Notes (Addendum)
Subjective:   Victoria Harris is a 68 y.o. female who presents for Medicare Annual (Subsequent) preventive examination.  Review of Systems:  No ROS.  Medicare Wellness Virtual Visit.  Visual/audio telehealth visit, UTA vital signs.   See social history for additional risk factors.   Cardiac Risk Factors include: advanced age (>27men, >54 women);diabetes mellitus     Objective:     Vitals: There were no vitals taken for this visit.  There is no height or weight on file to calculate BMI.  Advanced Directives 04/21/2019 04/16/2018 04/02/2017 10/09/2016 04/05/2015  Does Patient Have a Medical Advance Directive? Yes Yes Yes Yes Yes  Type of Paramedic of [place];Living will [place];Living will Living will;Healthcare Power of Attorney - -  Does patient want to make changes to medical advance directive? No - Patient declined No - Patient declined No - Patient declined - -  Copy of [place] in Chart? No - copy requested No - copy requested No - copy requested - -    Tobacco Social History   Tobacco Use  Smoking Status Never Smoker  Smokeless Tobacco Never Used     Counseling given: Not Answered   Clinical Intake:  Pre-visit preparation completed: Yes        Diabetes: Yes(Followed by pcp)  How often do you need to have someone help you when you read instructions, pamphlets, or other written materials from your doctor or pharmacy?: 1 - Never  Interpreter Needed?: No     Past Medical History:  Diagnosis Date  . Diverticulosis   . Hypercholesterolemia   . Serrated adenoma of colon   . Stroke (cerebrum) ([place])   . Thrombocytopenia ([place])   . Uterus descensus    Past Surgical History:  Procedure Laterality Date  . ABDOMINAL HYSTERECTOMY  2009  . CHOLECYSTECTOMY  2007  . COLONOSCOPY N/A 04/05/2015   Procedure: COLONOSCOPY;  Surgeon: Lollie Sails, MD;  Location[facility]COPY;  Service:  Endoscopy;  Laterality: N/A;  . COLONOSCOPY WITH PROPOFOL N/A 10/09/2016   Procedure: COLONOSCOPY WITH PROPOFOL;  Surgeon: Lollie Sails, MD;  Location: [facility] ENDOSCOPY;  Service: Endoscopy;  Laterality: N/A;  . DILATION AND CURETTAGE OF UTERUS  2001 and 2006  . FOOT SURGERY  2007  . TONSILLECTOMY  1960  . TUBAL LIGATION    . vocal cord cyst removed     Family History  Problem Relation Age of Onset  . Diabetes Other   . Colon cancer Mother   . Diabetes Mother   . Kidney disease Mother   . Heart disease Mother   . Stroke Paternal Grandmother   . Stroke Paternal Grandfather   . Prostate cancer Father   . Diabetes Father   . Glaucoma Father   . Breast cancer Paternal Aunt        x2  . Uterine cancer Maternal Aunt   . Diabetes Brother   . Diabetes Brother    Social History   Socioeconomic History  . Marital status: Married    Spouse name: Not on file  . Number of children: Not on file  . Years of education: Not on file  . Highest education level: Not on file  Occupational History  . Not on file  Social Needs  . Financial resource strain: Not hard at all  . Food insecurity    Worry: Never true    Inability: Never true  . Transportation needs    Medical: No  Non-medical: No  Tobacco Use  . Smoking status: Never Smoker  . Smokeless tobacco: Never Used  Substance and Sexual Activity  . Alcohol use: No    Alcohol/week: 0.0 standard drinks  . Drug use: No  . Sexual activity: Never  Lifestyle  . Physical activity    Days per week: 3 days    Minutes per session: 20 min  . Stress: Not at all  Relationships  . Social Herbalist on phone: Not on file    Gets together: Not on file    Attends religious service: Not on file    Active member of club or organization: Not on file    Attends meetings of clubs or organizations: Not on file    Relationship status: Married  Other Topics Concern  . Not on file  Social History Narrative  . Not on file     Outpatient Encounter Medications as of 04/21/2019  Medication Sig  . aspirin 81 MG tablet Take 81 mg by mouth daily.  . Cholecalciferol (VITAMIN D-1000 MAX ST) 1000 units tablet Take by mouth.  . colestipol (COLESTID) 1 g tablet TAKE 1 TABLET BY MOUTH ONCE DAILY  . dorzolamide-timolol (COSOPT) 22.3-6.8 MG/ML ophthalmic solution Place 1 drop into both eyes 2 (two) times daily.  Marland Kitchen glucose blood test strip Use as instructed to check blood sugars daily. Dx e11.9  . Lancets MISC Use as directed to check blood sugars daily. Dx e11.9  . Multiple Vitamins-Minerals (MULTIVITAMIN ADULT PO) Take by mouth daily.  . Omega-3 Fatty Acids (FISH OIL) 1000 MG CAPS Take by mouth daily.  . Saccharomyces boulardii (FLORASTOR PO) Take by mouth daily as needed.  . TRAVATAN Z 0.004 % SOLN ophthalmic solution Place 1 drop into both eyes at bedtime.  . metFORMIN (GLUCOPHAGE XR) 500 MG 24 hr tablet Take 1 tablet (500 mg total) by mouth daily with breakfast. (Patient not taking: Reported on 04/21/2019)   No facility-administered encounter medications on file as of 04/21/2019.     Activities of Daily Living In your present state of health, do you have any difficulty performing the following activities: 04/21/2019  Hearing? N  Vision? N  Difficulty concentrating or making decisions? N  Walking or climbing stairs? N  Dressing or bathing? N  Doing errands, shopping? N  Preparing Food and eating ? N  Using the Toilet? N  In the past six months, have you accidently leaked urine? Y  Do you have problems with loss of bowel control? N  Managing your Medications? N  Managing your Finances? N  Housekeeping or managing your Housekeeping? N  Some recent data might be hidden    Patient Care Team: Einar Pheasant, MD as PCP - General (Internal Medicine)    Assessment:   This is a routine wellness examination for [place].  Nurse connected with patient 04/21/19 at  9:30 AM EST by a telephone enabled telemedicine  application and verified that I am speaking with the correct person using two identifiers. Patient stated full name and DOB. Patient gave permission to continue with virtual visit. Patient's location was at home and Nurse's location was at [place] office.   Health Maintenance Due: -Influenza and TDAP vaccine- discussed; to be completed in season with doctor or local pharmacy.   -Dexa Scan- deferred to discuss with pcp per patient preference.  -Hgb A1c- 04/20/19 (6.0) Update all pending maintenance due as appropriate.   See completed HM at the end of note.   Eye: Visual  acuity not assessed. Virtual visit. Wears corrective lenses. Followed by their ophthalmologist. Retinopathy- none reported. Glaucoma.   Dental: Visits every 6 months.    Hearing: Demonstrates normal hearing during visit.  Safety:  Patient feels safe at home- yes Patient does have smoke detectors at home- yes Patient does wear sunscreen or protective clothing when in direct sunlight - yes Patient does wear seat belt when in a moving vehicle - yes Patient drives- yes Adequate lighting in walkways free from debris- yes Grab bars and handrails used as appropriate- yes Ambulates with no assistive device Cell phone or lifeline/life alert/medic alert on person when ambulating outside of the home- yes  Social: Alcohol intake - no    Smoking history- never   Smokers in home? none Illicit drug use? none  Depression: PHQ 2 &9 complete. See screening below. Denies irritability, anhedonia, sadness/tearfullness.    Falls: See screening below.    Medication: Taking as directed and without issues.   Covid-19: Precautions and sickness symptoms discussed. Wears mask, social distancing, hand hygiene as appropriate.   Activities of Daily Living Patient denies needing assistance with: household chores, feeding themselves, getting from bed to chair, getting to the toilet, bathing/showering, dressing, managing money, or  preparing meals.   Memory: Patient is alert. Patient denies difficulty focusing or concentrating. Correctly identified the president of the [place], season and recall. Patient likes to read, play sodoku, complete find a word and cooking for brain stimulation.   BMI- discussed the importance of a healthy diet, water intake and the benefits of aerobic exercise.  Educational material provided.  Physical activity- walking  Diet:  Sugar free bread, low carb diet; moderate diet Water: good intake Caffeine: 1 cup intake  Other Providers Patient Care Team: Einar Pheasant, MD as PCP - General (Internal Medicine)  Exercise Activities and Dietary recommendations Current Exercise Habits: Home exercise routine, Type of exercise: walking, Intensity: Mild  Goals    . Increase physical activity     Walk for exercise       Fall Risk Fall Risk  04/21/2019 04/16/2018 04/02/2017 03/23/2016 07/19/2015  Falls in the past year? 0 0 No No No  Follow up Falls prevention discussed;Education provided - - - -   Timed Get Up and Go performed: no, virtual visit  Depression Screen PHQ 2/9 Scores 04/21/2019 10/07/2018 04/16/2018 04/02/2017  PHQ - 2 Score 0 0 0 0  PHQ- 9 Score - - - 0     Cognitive Function MMSE - Mini Mental State Exam 04/16/2018 04/02/2017  Orientation to time 5 5  Orientation to Place 5 5  Registration 3 3  Attention/ Calculation 5 5  Recall 3 3  Language- name 2 objects 2 2  Language- repeat 1 1  Language- follow 3 step command 3 3  Language- read & follow direction 1 1  Write a sentence 1 1  Copy design 1 1  Total score 30 30     6CIT Screen 04/21/2019  What Year? 0 points  What month? 0 points  What time? 0 points  Count back from 20 0 points  Months in reverse 0 points  Repeat phrase 0 points  Total Score 0    Immunization History  Administered Date(s) Administered  . Influenza, High Dose Seasonal PF 03/23/2016, 04/04/2017, 04/18/2018  . Influenza,inj,Quad PF,6+  Mos 05/25/2015  . Pneumococcal Conjugate-13 07/19/2015  . Pneumococcal Polysaccharide-23 11/29/2016  . Zoster 09/17/2013   Screening Tests Health Maintenance  Topic Date Due  . TETANUS/TDAP  11/13/1968  . DEXA SCAN  11/14/2014  . INFLUENZA VACCINE  08/26/2019 (Originally 12/27/2018)  . FOOT EXAM  09/22/2019  . HEMOGLOBIN A1C  10/18/2019  . MAMMOGRAM  12/01/2019  . URINE MICROALBUMIN  12/16/2019  . OPHTHALMOLOGY EXAM  04/08/2020  . COLONOSCOPY  10/10/2026  . Hepatitis C Screening  Completed  . PNA vac Low Risk Adult  Completed      Plan:   Keep all routine maintenance appointments.   Follow up with your doctor today @ 10:00.  Medicare Attestation I have personally reviewed: The patient's medical and social history Their use of alcohol, tobacco or illicit drugs Their current medications and supplements The patient's functional ability including ADLs,fall risks, home safety risks, cognitive, and hearing and visual impairment Diet and physical activities Evidence for depression   In addition, I have reviewed and discussed with patient certain preventive protocols, quality metrics, and best practice recommendations. A written personalized care plan for preventive services as well as general preventive health recommendations were provided to patient via mail.     Varney Biles, LPN  [phone]   Reviewed above information.  Agree with assessment and plan.    Dr Nicki Reaper

## 2019-04-21 NOTE — Assessment & Plan Note (Signed)
Overall doing well.  Follow.   

## 2019-04-21 NOTE — Assessment & Plan Note (Signed)
On aspirin.  Doing well.

## 2019-04-21 NOTE — Assessment & Plan Note (Signed)
Discussed calculated cholesterol risk.  Low cholesterol diet and exercise.  She declines cholesterol medication.  Follow lipid panel and liver function tests.

## 2019-06-16 DIAGNOSIS — U071 COVID-19: Secondary | ICD-10-CM | POA: Diagnosis not present

## 2019-06-18 ENCOUNTER — Ambulatory Visit: Payer: PPO | Admitting: Podiatry

## 2019-06-23 ENCOUNTER — Emergency Department: Payer: PPO

## 2019-06-23 ENCOUNTER — Telehealth: Payer: Self-pay | Admitting: Internal Medicine

## 2019-06-23 ENCOUNTER — Emergency Department
Admission: EM | Admit: 2019-06-23 | Discharge: 2019-06-23 | Disposition: A | Discharge status: Home / Self Care | Payer: PPO | Attending: Emergency Medicine | Admitting: Emergency Medicine

## 2019-06-23 ENCOUNTER — Other Ambulatory Visit: Payer: Self-pay

## 2019-06-23 DIAGNOSIS — Z8673 Personal history of transient ischemic attack (TIA), and cerebral infarction without residual deficits: Secondary | ICD-10-CM | POA: Diagnosis not present

## 2019-06-23 DIAGNOSIS — R55 Syncope and collapse: Secondary | ICD-10-CM | POA: Diagnosis not present

## 2019-06-23 DIAGNOSIS — R531 Weakness: Secondary | ICD-10-CM | POA: Diagnosis not present

## 2019-06-23 DIAGNOSIS — U071 COVID-19: Secondary | ICD-10-CM | POA: Diagnosis not present

## 2019-06-23 DIAGNOSIS — R197 Diarrhea, unspecified: Secondary | ICD-10-CM | POA: Diagnosis not present

## 2019-06-23 DIAGNOSIS — E860 Dehydration: Secondary | ICD-10-CM | POA: Diagnosis not present

## 2019-06-23 DIAGNOSIS — Z79899 Other long term (current) drug therapy: Secondary | ICD-10-CM | POA: Insufficient documentation

## 2019-06-23 DIAGNOSIS — E119 Type 2 diabetes mellitus without complications: Secondary | ICD-10-CM | POA: Insufficient documentation

## 2019-06-23 DIAGNOSIS — R42 Dizziness and giddiness: Secondary | ICD-10-CM | POA: Insufficient documentation

## 2019-06-23 DIAGNOSIS — Z7982 Long term (current) use of aspirin: Secondary | ICD-10-CM | POA: Diagnosis not present

## 2019-06-23 DIAGNOSIS — R509 Fever, unspecified: Secondary | ICD-10-CM | POA: Diagnosis not present

## 2019-06-23 LAB — CBC WITH DIFFERENTIAL/PLATELET
Abs Immature Granulocytes: 0.01 10*3/uL (ref 0.00–0.07)
Basophils Absolute: 0 10*3/uL (ref 0.0–0.1)
Basophils Relative: 0 %
Eosinophils Absolute: 0.1 10*3/uL (ref 0.0–0.5)
Eosinophils Relative: 1 %
HCT: 39.8 % (ref 36.0–46.0)
Hemoglobin: 14 g/dL (ref 12.0–15.0)
Immature Granulocytes: 0 %
Lymphocytes Relative: 21 %
Lymphs Abs: 1.2 10*3/uL (ref 0.7–4.0)
MCH: 31.8 pg (ref 26.0–34.0)
MCHC: 35.2 g/dL (ref 30.0–36.0)
MCV: 90.5 fL (ref 80.0–100.0)
Monocytes Absolute: 0.4 10*3/uL (ref 0.1–1.0)
Monocytes Relative: 6 %
Neutro Abs: 4.1 10*3/uL (ref 1.7–7.7)
Neutrophils Relative %: 72 %
Platelets: 156 10*3/uL (ref 150–400)
RBC: 4.4 MIL/uL (ref 3.87–5.11)
RDW: 12 % (ref 11.5–15.5)
WBC: 5.7 10*3/uL (ref 4.0–10.5)
nRBC: 0 % (ref 0.0–0.2)

## 2019-06-23 LAB — BASIC METABOLIC PANEL
Anion gap: 11 (ref 5–15)
BUN: 17 mg/dL (ref 8–23)
CO2: 20 mmol/L — ABNORMAL LOW (ref 22–32)
Calcium: 9.1 mg/dL (ref 8.9–10.3)
Chloride: 107 mmol/L (ref 98–111)
Creatinine, Ser: 0.56 mg/dL (ref 0.44–1.00)
GFR calc Af Amer: >60 mL/min (ref >60)
GFR calc non Af Amer: >60 mL/min (ref >60)
Glucose, Bld: 128 mg/dL — ABNORMAL HIGH (ref 70–99)
Potassium: 3.4 mmol/L — ABNORMAL LOW (ref 3.5–5.1)
Sodium: 138 mmol/L (ref 135–145)

## 2019-06-23 LAB — GLUCOSE, CAPILLARY: Glucose-Capillary: 117 mg/dL — ABNORMAL HIGH (ref 70–99)

## 2019-06-23 LAB — TROPONIN I (HIGH SENSITIVITY)
Troponin I (High Sensitivity): 3 ng/L (ref <18)
Troponin I (High Sensitivity): 3 ng/L (ref <18)

## 2019-06-23 LAB — RESPIRATORY PANEL BY RT PCR (FLU A&B, COVID)
Influenza A by PCR: NEGATIVE
Influenza B by PCR: NEGATIVE
SARS Coronavirus 2 by RT PCR: POSITIVE — AB

## 2019-06-23 MED ORDER — LACTATED RINGERS IV BOLUS
1000.0000 mL | Freq: Once | INTRAVENOUS | Status: AC
  Administered 2019-06-23: 16:00:00 1000 mL via INTRAVENOUS

## 2019-06-23 NOTE — ED Notes (Signed)
Pt ambulated in room with continuous pulse ox. O2 sat remained >94%. EDP notified.

## 2019-06-23 NOTE — Telephone Encounter (Signed)
Patient husband really  Concerned because she has been sick since the 18th and not eating, drinking maybe 4o OZ of fluid a day.  running low grade fever off and on , chills , some SOB when walking , mainly stays in recliner and feeling weak. Does not want o to go to ER wants to see PCP scheduled virtual for 12:30 tomorrow but had patient husband promise if condition worsen at all needs to to go to ER immediately.

## 2019-06-23 NOTE — ED Notes (Signed)
Taken to room 8 via wheelchair and ED tech immediately after triage.

## 2019-06-23 NOTE — ED Provider Notes (Signed)
[facility] Emergency Department Provider Note   ____________________________________________   First MD Initiated Contact with Patient 06/23/19 [phone]     (approximate)  I have reviewed the triage vital signs and the nursing notes.   HISTORY  Chief Complaint Loss of Consciousness    HPI Victoria Harris is a 69 y.o. female with past medical history of diabetes and stroke who presents to the ED for syncope.  Patient reports she was exposed to Covid about 2 weeks ago, had been feeling generally weak and tested positive for Covid exactly 1 week ago at an urgent care.  She has continued to have worsening weakness since then along with fever and diarrhea.  She denies any cough, chest pain, shortness of breath, nausea, or vomiting.  She went to be seen at her doctor's office earlier today, but began feeling very lightheaded and subsequently passed out.  Initial blood pressure at her doctor's office was noted to be low, however it improved once she was laid down on a stretcher.  She denies any chest pain or shortness of breath associated with this episode, but does report continuing to feel lightheaded.  She states she has had a very poor appetite lately and has not been able to eat or drink much.        Past Medical History:  Diagnosis Date  . Diverticulosis   . Hypercholesterolemia   . Serrated adenoma of colon   . Stroke (cerebrum) ([place])   . Thrombocytopenia ([place])   . Uterus descensus     Patient Active Problem List   Diagnosis Date Noted  . Cough 09/02/2017  . Glaucoma 03/25/2016  . 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 History of colonic polyps 04/07/2015  . History of CVA (cerebrovascular accident) 08/02/2014  . Stress 08/02/2014  . Plantar fasciitis 07/05/2014  . BMI 30.0-30.9,adult 07/05/2014  . Health care maintenance 07/05/2014  . Breast pain, right 05/25/2014  . Pain of left heel 01/03/2014  . Pain in soft  tissues of limb 01/03/2014  . Foot fracture, left 07/07/2013  . Thrombocytopenia ([place]) 03/26/2012  . Hypercholesteremia 03/26/2012    Past Surgical History:  Procedure Laterality Date  . ABDOMINAL HYSTERECTOMY  2009  . CHOLECYSTECTOMY  2007  . COLONOSCOPY N/A 04/05/2015   Procedure: COLONOSCOPY;  Surgeon: Lollie Sails, MD;  Location: [facility] ENDOSCOPY;  Service: Endoscopy;  Laterality: N/A;  . COLONOSCOPY WITH PROPOFOL N/A 10/09/2016   Procedure: COLONOSCOPY WITH PROPOFOL;  Surgeon: Lollie Sails, MD;  Location: [facility] ENDOSCOPY;  Service: Endoscopy;  Laterality: N/A;  . DILATION AND CURETTAGE OF UTERUS  2001 and 2006  . FOOT SURGERY  2007  . TONSILLECTOMY  1960  . TUBAL LIGATION    . vocal cord cyst removed      Prior to Admission medications   Medication Sig Start Date End Date Taking? Authorizing Provider  aspirin 81 MG tablet Take 81 mg by mouth daily.   Yes [provider]  Cholecalciferol (VITAMIN D-1000 MAX ST) 1000 units tablet Take 1,000 Units by mouth daily.    Yes [provider]  colestipol (COLESTID) 1 g tablet TAKE 1 TABLET BY MOUTH ONCE DAILY 11/03/18  Yes [provider]  dorzolamide-timolol (COSOPT) 22.3-6.8 MG/ML ophthalmic solution Place 1 drop into both eyes 2 (two) times daily. 07/19/17  Yes [provider]  Multiple Vitamins-Minerals (MULTIVITAMIN ADULT PO) Take by mouth daily.   Yes [provider]  Omega-3 Fatty Acids (FISH OIL)  1000 MG CAPS Take by mouth daily.   Yes [provider]  Saccharomyces boulardii (FLORASTOR PO) Take by mouth daily.    Yes [provider]  TRAVATAN Z 0.004 % SOLN ophthalmic solution Place 1 drop into both eyes at bedtime. 06/09/18  Yes [provider]    Allergies No known drug allergy  Family History  Problem Relation Age of Onset  . Diabetes Other   . Colon cancer Mother   . Diabetes Mother   . Kidney disease Mother   . Heart disease Mother   . Stroke  Paternal Grandmother   . Stroke Paternal Grandfather   . Prostate cancer Father   . Diabetes Father   . Glaucoma Father   . Breast cancer Paternal Aunt        x2  . Uterine cancer Maternal Aunt   . Diabetes Brother   . Diabetes Brother     Social History Social History   Tobacco Use  . Smoking status: Never Smoker  . Smokeless tobacco: Never Used  Substance Use Topics  . Alcohol use: No    Alcohol/week: 0.0 standard drinks  . Drug use: No    Review of Systems  Constitutional: Positive for fever and generalized weakness. Eyes: No visual changes. ENT: No sore throat. Cardiovascular: Denies chest pain. Respiratory: Denies shortness of breath. Gastrointestinal: No abdominal pain.  No nausea, no vomiting.  Positive for diarrhea.  No constipation. Genitourinary: Negative for dysuria. Musculoskeletal: Negative for back pain. Skin: Negative for rash. Neurological: Negative for headaches, focal weakness or numbness.  ____________________________________________   PHYSICAL EXAM:  VITAL SIGNS: ED Triage Vitals [06/23/19 1603]  Enc Vitals Group     BP      Pulse      Resp      Temp      Temp src      SpO2      Weight 168 lb (76.2 kg)     Height 5\' 5"  (1.651 m)     Head Circumference      Peak Flow      Pain Score 0     Pain Loc      Pain Edu?      Excl. in [place]?     Constitutional: Alert and oriented. Eyes: Conjunctivae are normal. Head: Atraumatic. Nose: No congestion/rhinnorhea. Mouth/Throat: Mucous membranes are moist. Neck: Normal ROM Cardiovascular: Normal rate, regular rhythm. Grossly normal heart sounds. Respiratory: Normal respiratory effort.  No retractions. Lungs CTAB. Gastrointestinal: Soft and nontender. No distention. Genitourinary: deferred Musculoskeletal: No lower extremity tenderness nor edema. Neurologic:  Normal speech and language. No gross focal neurologic deficits are appreciated. Skin:  Skin is warm, dry and intact. No rash  noted. Psychiatric: Mood and affect are normal. Speech and behavior are normal.  ____________________________________________   LABS (all labs ordered are listed, but only abnormal results are displayed)  Labs Reviewed  RESPIRATORY PANEL BY RT PCR (FLU A&B, COVID) - Abnormal; Notable for the following components:      Result Value   SARS Coronavirus 2 by RT PCR POSITIVE (*)    All other components within normal limits  BASIC METABOLIC PANEL - Abnormal; Notable for the following components:   Potassium 3.4 (*)    CO2 20 (*)    Glucose, Bld 128 (*)    All other components within normal limits  GLUCOSE, CAPILLARY - Abnormal; Notable for the following components:   Glucose-Capillary 117 (*)    All other components within normal limits  CBC WITH DIFFERENTIAL/PLATELET  TROPONIN I (HIGH SENSITIVITY)  TROPONIN I (HIGH SENSITIVITY)   ____________________________________________  EKG  ED ECG REPORT I, Blake Divine, the attending physician, personally viewed and interpreted this ECG.   Date: 06/23/2019  EKG Time: 16:16  Rate: 63  Rhythm: normal sinus rhythm  Axis: Normal  Intervals:none  ST&T Change: None   PROCEDURES  Procedure(s) performed (including Critical Care):  Procedures   ____________________________________________   INITIAL IMPRESSION / ASSESSMENT AND PLAN / ED COURSE       69 year old female presents to the ED for worsening generalized weakness after diagnosis of COVID-19 1 week ago.  She began to feel especially lightheaded at her doctor's office earlier today and subsequently had a syncopal episode, but did not fall or hit her head.  We will screen EKG, labs, and troponin, but I suspect this episode was due to dehydration given her poor p.o. intake and diarrhea.  We will hydrate with IV fluids and repeat testing for COVID-19 as I am unable to confirm her diagnosis in the chart.  Blood pressure is now much improved once patient laid down on stretcher, will  continue to monitor.  EKG without evidence of arrhythmia or ischemia, 2 sets of troponin are negative.  Remainder of lab work is reassuring and chest x-ray negative for acute process.  Patient is feeling much better following IV fluid bolus, she was able to ambulate without lightheadedness or shortness of breath, O2 sats remained within normal limits.  Orthostatic vital signs are negative.  I have counseled patient to use loperamide as needed for diarrhea and to drink plenty of water.  Counseled patient to return to the ED for new or worsening symptoms, patient agrees with plan.      ____________________________________________   FINAL CLINICAL IMPRESSION(S) / ED DIAGNOSES  Final diagnoses:  Syncope, unspecified syncope type  COVID-19 virus infection  Dehydration     ED Discharge Orders    None       Note:  This document was prepared using Dragon voice recognition software and may include unintentional dictation errors.   Blake Divine, MD 06/23/19 1910

## 2019-06-23 NOTE — Discharge Instructions (Signed)
You may take loperamide (generic name for Imodium) as needed for diarrhea.  Focus on staying hydrated and drink plenty of water or Gatorade.  Please return to the ER for worsening shortness of breath, weakness, lightheadedness, or any other concerning symptoms.  Otherwise, please follow-up closely with your primary care doctor.

## 2019-06-23 NOTE — ED Triage Notes (Signed)
Reports syncope at doctor's office. Pt has been experiencing weakness over past few days--pt also COVID + in last 2 week. Diaphoretic and fatigued at time of triage. Brought to ER on stretcher.

## 2019-06-24 ENCOUNTER — Other Ambulatory Visit: Payer: Self-pay

## 2019-06-24 ENCOUNTER — Encounter: Payer: Self-pay | Admitting: Internal Medicine

## 2019-06-24 ENCOUNTER — Ambulatory Visit (INDEPENDENT_AMBULATORY_CARE_PROVIDER_SITE_OTHER): Payer: PPO | Admitting: Internal Medicine

## 2019-06-24 DIAGNOSIS — D696 Thrombocytopenia, unspecified: Secondary | ICD-10-CM | POA: Diagnosis not present

## 2019-06-24 DIAGNOSIS — U071 COVID-19: Secondary | ICD-10-CM

## 2019-06-24 DIAGNOSIS — E1159 Type 2 diabetes mellitus with other circulatory complications: Secondary | ICD-10-CM

## 2019-06-24 NOTE — Progress Notes (Signed)
[name], female   DOB: Dec 13, 1950, 69 y.o.   MRN: [id number]   Virtual Visit via telephone Note  This visit type was conducted due to national recommendations for restrictions regarding the COVID-19 pandemic (e.g. social distancing).  This format is felt to be most appropriate for this patient at this time.  All issues noted in this document were discussed and addressed.  No physical exam was performed (except for noted visual exam findings with Video Visits).   I connected with [name] by telephone and verified that I am speaking with the correct person using two identifiers. Location patient: home Location provider: work Persons participating in the telephone visit: patient, provider and pts husband Coralyn Mark.   The limitations, risks, security and privacy concerns of performing an evaluation and management service by telephone and the availability of in person appointments have been discussed.  The patient expressed understanding and agreed to proceed.   Reason for visit: work in appt.   HPI: Reports she has been sick since 06/15/19.  Not eating.  Low grade fever.  Symptoms started with head fullness and tightness.  Felt chilled.  Subsequently developed loss of taste and then loss of smell.  Was tested 06/16/19.  Positive.  Head tight.  Clear nasal mucus.  Increased aching.  No cough or chest congestion.  Went to [facility] found to be hypotensive.  Transferred to ER.  Reported syncopal episode - felt to be related to dehydration.  Was treated with IVFs.  EKG - no evidence of arrhythmia or ischemia.  Two sets of troponin negative  cxr - negative for acute process.  O2 sats remained wnl per documentation.  Discharged home. States since discharge, has continued to feel weak.  Still low grade fever and chills.  Some sob with walking.  Was able to eat a little something yesterday and today.  No chest pain or chest congestion.  No increased cough.  Taking imodium prn for loose  stool.     ROS: See pertinent positives and negatives per HPI.  Past Medical History:  Diagnosis Date  . Diverticulosis   . Hypercholesterolemia   . Serrated adenoma of colon   . Stroke (cerebrum) ([place])   . Thrombocytopenia ([place])   . Uterus descensus     Past Surgical History:  Procedure Laterality Date  . ABDOMINAL HYSTERECTOMY  2009  . CHOLECYSTECTOMY  2007  . COLONOSCOPY N/A 04/05/2015   Procedure: COLONOSCOPY;  Surgeon: Lollie Sails, MD;  Location: [facility] ENDOSCOPY;  Service: Endoscopy;  Laterality: N/A;  . COLONOSCOPY WITH PROPOFOL N/A 10/09/2016   Procedure: COLONOSCOPY WITH PROPOFOL;  Surgeon: Lollie Sails, MD;  Location: [facility] ENDOSCOPY;  Service: Endoscopy;  Laterality: N/A;  . DILATION AND CURETTAGE OF UTERUS  2001 and 2006  . FOOT SURGERY  2007  . TONSILLECTOMY  1960  . TUBAL LIGATION    . vocal cord cyst removed      Family History  Problem Relation Age of Onset  . Diabetes Other   . Colon cancer Mother   . Diabetes Mother   . Kidney disease Mother   . Heart disease Mother   . Stroke Paternal Grandmother   . Stroke Paternal Grandfather   . Prostate cancer Father   . Diabetes Father   . Glaucoma Father   . Breast cancer Paternal Aunt        x2  . Uterine cancer Maternal Aunt   . Diabetes Brother   . Diabetes Brother  SOCIAL HX: reviewed.    Current Outpatient Medications:  .  aspirin 81 MG tablet, Take 81 mg by mouth daily., Disp: , Rfl:  .  Cholecalciferol (VITAMIN D-1000 MAX ST) 1000 units tablet, Take 1,000 Units by mouth daily. , Disp: , Rfl:  .  colestipol (COLESTID) 1 g tablet, TAKE 1 TABLET BY MOUTH ONCE DAILY, Disp: , Rfl:  .  dorzolamide-timolol (COSOPT) 22.3-6.8 MG/ML ophthalmic solution, Place 1 drop into both eyes 2 (two) times daily., Disp: , Rfl: 5 .  Multiple Vitamins-Minerals (MULTIVITAMIN ADULT PO), Take by mouth daily., Disp: , Rfl:  .  Omega-3 Fatty Acids (FISH OIL) 1000 MG CAPS, Take by mouth daily., Disp: , Rfl:  .   Saccharomyces boulardii (FLORASTOR PO), Take by mouth daily. , Disp: , Rfl:  .  TRAVATAN Z 0.004 % SOLN ophthalmic solution, Place 1 drop into both eyes at bedtime., Disp: , Rfl:   EXAM:  VITALS per patient if applicable:  [id number], [id number] with pulse 70 and 72  GENERAL: alert. Answering questions appropriately.  Sounds to be in no acute distress  PSYCH/NEURO: pleasant and cooperative, no obvious depression or anxiety, speech and thought processing grossly intact  ASSESSMENT AND PLAN:  Discussed the following assessment and plan:  COVID-19 virus infection Recently diagnosed with covid.  Symptoms as outlined.  Decreased po intake, diarrhea, weakness, fever, etc.  No increased cough or congestion.  cxr with no acute abnormalities.  Taking imodium prn.  Starting to eat a little something.  Discussed staying hydrated and treating symptoms.  Rest.  Tylenol for fever, pain, etc.  Discussed monoclonal ab infusion. She declines at this time.  Husband supportive.  Will call with update.  Follow pressures and sugars.    Type 2 diabetes mellitus with vascular disease (HCC) Follow sugars.  Encourage increased po intake.  Follow.    Thrombocytopenia Platelet count wnl on recent check.  Follow.    I discussed the assessment and treatment plan with the patient. The patient was provided an opportunity to ask questions and all were answered. The patient agreed with the plan and demonstrated an understanding of the instructions.   The patient was advised to call back or seek an in-person evaluation if the symptoms worsen or if the condition fails to improve as anticipated.  I provided 25 minutes of non-face-to-face time during this encounter.   Einar Pheasant, MD

## 2019-06-25 ENCOUNTER — Ambulatory Visit: Payer: PPO | Admitting: Podiatry

## 2019-06-26 ENCOUNTER — Telehealth: Payer: Self-pay | Admitting: Internal Medicine

## 2019-06-26 NOTE — Telephone Encounter (Signed)
Notify them that I am glad she is feeling better.  Please continue to keep [place] posted and let us know if needs anything.    Dr Nicki Reaper

## 2019-06-26 NOTE — Telephone Encounter (Signed)
Called Pt back with message from Dr. Nicki Reaper.  Pt stated Yes she will notify us if she needs anything.

## 2019-06-26 NOTE — Telephone Encounter (Signed)
Pt's husband called and said pt is doing tremendously better. He said her temperature this morning was 97 and her blood pressure for 110/79. He said the Tylenol is working and she drank about 60oz of water yesterday. He said thank you.

## 2019-06-28 ENCOUNTER — Encounter: Payer: Self-pay | Admitting: Internal Medicine

## 2019-06-28 DIAGNOSIS — Z8616 Personal history of COVID-19: Secondary | ICD-10-CM | POA: Insufficient documentation

## 2019-06-28 NOTE — Assessment & Plan Note (Signed)
Platelet count wnl on recent check.  Follow.

## 2019-06-28 NOTE — Assessment & Plan Note (Signed)
Recently diagnosed with covid.  Symptoms as outlined.  Decreased po intake, diarrhea, weakness, fever, etc.  No increased cough or congestion.  cxr with no acute abnormalities.  Taking imodium prn.  Starting to eat a little something.  Discussed staying hydrated and treating symptoms.  Rest.  Tylenol for fever, pain, etc.  Discussed monoclonal ab infusion. She declines at this time.  Husband supportive.  Will call with update.  Follow pressures and sugars.

## 2019-06-28 NOTE — Assessment & Plan Note (Signed)
Follow sugars.  Encourage increased po intake.  Follow.

## 2019-06-30 ENCOUNTER — Ambulatory Visit: Payer: PPO | Admitting: Internal Medicine

## 2019-07-09 ENCOUNTER — Encounter: Payer: Self-pay | Admitting: Podiatry

## 2019-07-09 ENCOUNTER — Ambulatory Visit: Payer: PPO | Admitting: Podiatry

## 2019-07-09 ENCOUNTER — Other Ambulatory Visit: Payer: Self-pay

## 2019-07-09 DIAGNOSIS — M2012 Hallux valgus (acquired), left foot: Secondary | ICD-10-CM | POA: Diagnosis not present

## 2019-07-09 DIAGNOSIS — B351 Tinea unguium: Secondary | ICD-10-CM | POA: Diagnosis not present

## 2019-07-09 DIAGNOSIS — E119 Type 2 diabetes mellitus without complications: Secondary | ICD-10-CM | POA: Diagnosis not present

## 2019-07-09 DIAGNOSIS — M79674 Pain in right toe(s): Secondary | ICD-10-CM

## 2019-07-09 NOTE — Progress Notes (Signed)
Complaint:  Visit Type: Patient returns to my office for continued preventative foot care services. Complaint: Patient states" my nails have grown long and thick and become painful to walk and wear shoes" Patient has been diagnosed with DM with no foot complications. The patient presents for preventative foot care services. No changes to ROS  Podiatric Exam: Vascular: dorsalis pedis and posterior tibial pulses are palpable bilateral. Capillary return is immediate. Temperature gradient is WNL. Skin turgor WNL  Sensorium: Normal Semmes Weinstein monofilament test. Normal tactile sensation bilaterally. Nail Exam: Pt has thick disfigured discolored nails with subungual debris noted hallux nail  Right.  Pincer nails 2,3 right foot. Ulcer Exam: There is no evidence of ulcer or pre-ulcerative changes or infection. Orthopedic Exam: Muscle tone and strength are WNL. No limitations in general ROM. No crepitus or effusions noted. Foot type and digits show no abnormalities. HAV  B/L.  Midfoot arthritis  B/L.  Plantar flexed second metatarsal right foot. Skin: No Porokeratosis. No infection or ulcers  Diagnosis:  Onychomycosis, , Pain in right toe,   Treatment & Plan Procedures and Treatment: Consent by patient was obtained for treatment procedures.   Debridement of mycotic and hypertrophic toenails, 1 through 5 bilateral and clearing of subungual debris. No ulceration, no infection noted.  Return Visit-Office Procedure: Patient instructed to return to the office for a follow up visit 3 months for continued evaluation and treatment.    Gardiner Barefoot DPM

## 2019-08-18 ENCOUNTER — Other Ambulatory Visit (INDEPENDENT_AMBULATORY_CARE_PROVIDER_SITE_OTHER): Payer: PPO

## 2019-08-18 ENCOUNTER — Other Ambulatory Visit: Payer: Self-pay

## 2019-08-18 DIAGNOSIS — E7800 Pure hypercholesterolemia, unspecified: Secondary | ICD-10-CM | POA: Diagnosis not present

## 2019-08-18 DIAGNOSIS — E1159 Type 2 diabetes mellitus with other circulatory complications: Secondary | ICD-10-CM | POA: Diagnosis not present

## 2019-08-18 LAB — LIPID PANEL
Cholesterol: 208 mg/dL — ABNORMAL HIGH (ref 0–200)
HDL: 63.3 mg/dL (ref >39.00)
LDL Cholesterol: 119 mg/dL — ABNORMAL HIGH (ref 0–99)
NonHDL: 144.94
Total CHOL/HDL Ratio: 3
Triglycerides: 131 mg/dL (ref 0.0–149.0)
VLDL: 26.2 mg/dL (ref 0.0–40.0)

## 2019-08-18 LAB — BASIC METABOLIC PANEL
BUN: 21 mg/dL (ref 6–23)
CO2: 31 mEq/L (ref 19–32)
Calcium: 9.6 mg/dL (ref 8.4–10.5)
Chloride: 107 mEq/L (ref 96–112)
Creatinine, Ser: 0.75 mg/dL (ref 0.40–1.20)
GFR: 76.45 mL/min (ref >60.00)
Glucose, Bld: 153 mg/dL — ABNORMAL HIGH (ref 70–99)
Potassium: 4.9 mEq/L (ref 3.5–5.1)
Sodium: 140 mEq/L (ref 135–145)

## 2019-08-18 LAB — TSH: TSH: 2.07 u[IU]/mL (ref 0.35–4.50)

## 2019-08-18 LAB — HEPATIC FUNCTION PANEL
ALT: 26 U/L (ref 0–35)
AST: 25 U/L (ref 0–37)
Albumin: 4.2 g/dL (ref 3.5–5.2)
Alkaline Phosphatase: 56 U/L (ref 39–117)
Bilirubin, Direct: 0.1 mg/dL (ref 0.0–0.3)
Total Bilirubin: 0.8 mg/dL (ref 0.2–1.2)
Total Protein: 6.6 g/dL (ref 6.0–8.3)

## 2019-08-18 LAB — HEMOGLOBIN A1C: Hgb A1c MFr Bld: 6.3 % (ref 4.6–6.5)

## 2019-08-20 ENCOUNTER — Encounter: Payer: Self-pay | Admitting: Internal Medicine

## 2019-08-20 ENCOUNTER — Other Ambulatory Visit: Payer: Self-pay

## 2019-08-20 ENCOUNTER — Ambulatory Visit (INDEPENDENT_AMBULATORY_CARE_PROVIDER_SITE_OTHER): Payer: PPO | Admitting: Internal Medicine

## 2019-08-20 DIAGNOSIS — Z1231 Encounter for screening mammogram for malignant neoplasm of breast: Secondary | ICD-10-CM

## 2019-08-20 DIAGNOSIS — Z8616 Personal history of COVID-19: Secondary | ICD-10-CM | POA: Diagnosis not present

## 2019-08-20 DIAGNOSIS — F439 Reaction to severe stress, unspecified: Secondary | ICD-10-CM

## 2019-08-20 DIAGNOSIS — E2839 Other primary ovarian failure: Secondary | ICD-10-CM | POA: Diagnosis not present

## 2019-08-20 DIAGNOSIS — E1159 Type 2 diabetes mellitus with other circulatory complications: Secondary | ICD-10-CM | POA: Diagnosis not present

## 2019-08-20 DIAGNOSIS — E7800 Pure hypercholesterolemia, unspecified: Secondary | ICD-10-CM

## 2019-08-20 DIAGNOSIS — Z8673 Personal history of transient ischemic attack (TIA), and cerebral infarction without residual deficits: Secondary | ICD-10-CM

## 2019-08-20 DIAGNOSIS — Z0000 Encounter for general adult medical examination without abnormal findings: Secondary | ICD-10-CM

## 2019-08-20 DIAGNOSIS — D696 Thrombocytopenia, unspecified: Secondary | ICD-10-CM | POA: Diagnosis not present

## 2019-08-20 MED ORDER — ROSUVASTATIN CALCIUM 5 MG PO TABS: 5.0000 mg | ORAL_TABLET | Freq: Every day | ORAL | 2 refills | Status: DC

## 2019-08-20 NOTE — Assessment & Plan Note (Addendum)
Physical today 08/20/19.  Mammogram 12/01/18 - Birads I.  Colonoscopy 09/2016 - multiple polyps.  Recommended f/u colonoscopy in 3 years.  Discussed due colonoscopy this year.  Schedule bone density.

## 2019-08-20 NOTE — Progress Notes (Signed)
[name], female   DOB: May 24, 1951, 69 y.o.   MRN: [id number]   Subjective:    [name], female    DOB: 1950-11-20, 69 y.o.   MRN: [id number]  HPI This visit occurred during the SARS-CoV-2 public health emergency.  Safety protocols were in place, including screening questions prior to the visit, additional usage of staff PPE, and extensive cleaning of exam room while observing appropriate contact time as indicated for disinfecting solutions.  Patient here for her physical exam.  She reports she is doing better.  Had covid recently.  Still with some fatigue and some residual change in her taste, but she does feel better.  Smell better.  Breathing better.  Eating.  No chest pain or sob reported.  No abdominal pain.  Has f/u with Christiane (GI) - next week.  Increased stress.  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  Overall handling things relatively well.  Discussed labs.  Discussed calculated cholesterol risk.  Discussed treatment options.  She is agreeable to start crestor.  Discussed colonoscopy due this year.  Also discussed f/u bone density.    Past Medical History:  Diagnosis Date  . Diverticulosis   . Hypercholesterolemia   . Serrated adenoma of colon   . Stroke (cerebrum) ([place])   . Thrombocytopenia ([place])   . Uterus descensus    Past Surgical History:  Procedure Laterality Date  . ABDOMINAL HYSTERECTOMY  2009  . CHOLECYSTECTOMY  2007  . COLONOSCOPY N/A 04/05/2015   Procedure: COLONOSCOPY;  Surgeon: Lollie Sails, MD;  Location: [facility] ENDOSCOPY;  Service: Endoscopy;  Laterality: N/A;  . COLONOSCOPY WITH PROPOFOL N/A 10/09/2016   Procedure: COLONOSCOPY WITH PROPOFOL;  Surgeon: Lollie Sails, MD;  Location: [facility] ENDOSCOPY;  Service: Endoscopy;  Laterality: N/A;  . DILATION AND CURETTAGE OF UTERUS  2001 and 2006  . FOOT SURGERY  2007  . TONSILLECTOMY  1960  . TUBAL LIGATION    . vocal cord cyst removed     Family History    Problem Relation Age of Onset  . Diabetes Other   . Colon cancer Mother   . Diabetes Mother   . Kidney disease Mother   . Heart disease Mother   . Stroke Paternal Grandmother   . Stroke Paternal Grandfather   . Prostate cancer Father   . Diabetes Father   . Glaucoma Father   . Breast cancer Paternal Aunt        x2  . Uterine cancer Maternal Aunt   . Diabetes Brother   . Diabetes Brother    Social History   Socioeconomic History  . Marital status: Married    Spouse name: Not on file  . Number of children: Not on file  . Years of education: Not on file  . Highest education level: Not on file  Occupational History  . Not on file  Tobacco Use  . Smoking status: Never Smoker  . Smokeless tobacco: Never Used  Substance and Sexual Activity  . Alcohol use: No    Alcohol/week: 0.0 standard drinks  . Drug use: No  . Sexual activity: Never  Other Topics Concern  . Not on file  Social History Narrative  . Not on file   Social Determinants of Health   Financial Resource Strain:   . Difficulty of Paying Living Expenses:   Food Insecurity:   . Worried About Charity fundraiser in the Last Year:   . [place] in the  Last Year:   Transportation Needs:   . Film/video editor (Medical):   Marland Kitchen Lack of Transportation (Non-Medical):   Physical Activity:   . Days of Exercise per Week:   . Minutes of Exercise per Session:   Stress:   . Feeling of Stress :   Social Connections:   . Frequency of Communication with Friends and Family:   . Frequency of Social Gatherings with Friends and Family:   . Attends Religious Services:   . Active Member of Clubs or Organizations:   . Attends Archivist Meetings:   Marland Kitchen Marital Status:     Outpatient Encounter Medications as of 08/20/2019  Medication Sig  . aspirin 81 MG tablet Take 81 mg by mouth daily.  . Cholecalciferol (VITAMIN D-1000 MAX ST) 1000 units tablet Take 1,000 Units by mouth daily.   . colestipol (COLESTID)  1 g tablet TAKE 1 TABLET BY MOUTH ONCE DAILY  . dorzolamide-timolol (COSOPT) 22.3-6.8 MG/ML ophthalmic solution Place 1 drop into both eyes 2 (two) times daily.  . Multiple Vitamins-Minerals (MULTIVITAMIN ADULT PO) Take by mouth daily.  . Omega-3 Fatty Acids (FISH OIL) 1000 MG CAPS Take by mouth daily.  . Saccharomyces boulardii (FLORASTOR PO) Take by mouth daily.   . TRAVATAN Z 0.004 % SOLN ophthalmic solution Place 1 drop into both eyes at bedtime.  . [DISCONTINUED] rosuvastatin (CRESTOR) 5 MG tablet Take 1 tablet (5 mg total) by mouth daily.   No facility-administered encounter medications on file as of 08/20/2019.    Review of Systems  Constitutional: Negative for fatigue and unexpected weight change.  HENT: Negative for congestion and sinus pressure.        Still with change in taste as outlined.  Smell is better.   Eyes: Negative for pain and visual disturbance.  Respiratory: Negative for cough, chest tightness and shortness of breath.   Cardiovascular: Negative for chest pain, palpitations and leg swelling.  Gastrointestinal: Negative for abdominal pain and diarrhea.  Genitourinary: Negative for difficulty urinating and dysuria.  Musculoskeletal: Negative for back pain and joint swelling.  Skin: Negative for color change and rash.  Neurological: Negative for dizziness and headaches.  Hematological: Negative for adenopathy. Does not bruise/bleed easily.  Psychiatric/Behavioral: Negative for agitation and dysphoric mood.       Objective:    Physical Exam Vitals reviewed.  Constitutional:      General: She is not in acute distress.    Appearance: Normal appearance. She is well-developed.  HENT:     Head: Normocephalic and atraumatic.     Right Ear: External ear normal.     Left Ear: External ear normal.  Eyes:     General: No scleral icterus.       Right eye: No discharge.        Left eye: No discharge.     Conjunctiva/sclera: Conjunctivae normal.  Neck:     Thyroid:  No thyromegaly.  Cardiovascular:     Rate and Rhythm: Normal rate and regular rhythm.  Pulmonary:     Effort: Pulmonary effort is normal. No tachypnea, accessory muscle usage or respiratory distress.     Breath sounds: Normal breath sounds. No decreased breath sounds or wheezing.  Chest:     Breasts:        Right: No inverted nipple, mass, nipple discharge or tenderness (no axillary adenopathy).        Left: No inverted nipple, mass, nipple discharge or tenderness (no axilarry adenopathy).  Abdominal:  General: Bowel sounds are normal.     Palpations: Abdomen is soft.     Tenderness: There is no abdominal tenderness.  Musculoskeletal:        General: No swelling or tenderness.     Cervical back: Neck supple. No tenderness.  Lymphadenopathy:     Cervical: No cervical adenopathy.  Skin:    Findings: No erythema or rash.  Neurological:     Mental Status: She is alert and oriented to person, place, and time.  Psychiatric:        Mood and Affect: Mood normal.        Behavior: Behavior normal.     BP 126/80   Pulse 68   Temp (!) 96.8 F (36 C)   Resp 16   Ht 5' 6"  (1.676 m)   Wt 173 lb 3.2 oz (78.6 kg)   SpO2 98%   BMI 27.96 kg/m  Wt Readings from Last 3 Encounters:  08/20/19 173 lb 3.2 oz (78.6 kg)  06/24/19 168 lb (76.2 kg)  06/23/19 168 lb (76.2 kg)     Lab Results  Component Value Date   WBC 5.7 06/23/2019   HGB 14.0 06/23/2019   HCT 39.8 06/23/2019   PLT 156 06/23/2019   GLUCOSE 153 (H) 08/18/2019   CHOL 208 (H) 08/18/2019   TRIG 131.0 08/18/2019   HDL 63.30 08/18/2019   LDLCALC 119 (H) 08/18/2019   ALT 26 08/18/2019   AST 25 08/18/2019   NA 140 08/18/2019   K 4.9 08/18/2019   CL 107 08/18/2019   CREATININE 0.75 08/18/2019   BUN 21 08/18/2019   CO2 31 08/18/2019   TSH 2.07 08/18/2019   HGBA1C 6.3 08/18/2019   MICROALBUR 1.0 12/16/2018    DG Chest Portable 1 View  Result Date: 06/23/2019 CLINICAL DATA:  COVID, syncopal episode EXAM: PORTABLE  CHEST 1 VIEW COMPARISON:  06/07/2012 FINDINGS: Chronic bronchitic changes at the bases. No acute consolidation, pleural effusion or pneumothorax. Stable cardiomediastinal silhouette with aortic atherosclerosis. IMPRESSION: No acute focal airspace disease. Chronic bronchitic changes at the bases. Electronically Signed   By: Donavan Foil M.D.   On: 06/23/2019 17:08       Assessment & Plan:   Problem List Items Addressed This Visit    Health care maintenance    Physical today 08/20/19.  Mammogram 12/01/18 - Birads I.  Colonoscopy 09/2016 - multiple polyps.  Recommended f/u colonoscopy in 3 years.  Discussed due colonoscopy this year.  Schedule bone density.        History of COVID-19    Doing better.  Still with fatigue, but overall improving.  Still with taste change.  Smell better.  Taste is gradually coming back.  Follow.        History of CVA (cerebrovascular accident)    On aspirin.  Follow.       Hypercholesteremia    Discussed calculated cholesterol risk.  Discussed starting crestor. She is agreeable.  Low cholesterol diet and exercise.  Follow lipid panel and liver function tests.        Relevant Orders   Hepatic function panel   Stress    Overall handling stress relatively well.  Follow.       Thrombocytopenia (HCC)    Platelet count wnl last check.  Follow.       Type 2 diabetes mellitus with vascular disease (HCC)    Low carb diet and exercise.  Follow met b and a1c.   Lab Results  Component Value Date  HGBA1C 6.3 08/18/2019         Other Visit Diagnoses    Estrogen deficiency       Relevant Orders   DG Bone Density   Encounter for screening mammogram for malignant neoplasm of breast       Relevant Orders   MM 3D SCREEN BREAST BILATERAL       Einar Pheasant, MD

## 2019-08-25 DIAGNOSIS — K529 Noninfective gastroenteritis and colitis, unspecified: Secondary | ICD-10-CM | POA: Diagnosis not present

## 2019-08-25 DIAGNOSIS — Z86010 Personal history of colonic polyps: Secondary | ICD-10-CM | POA: Diagnosis not present

## 2019-08-27 ENCOUNTER — Other Ambulatory Visit: Payer: Self-pay | Admitting: Internal Medicine

## 2019-08-27 DIAGNOSIS — K529 Noninfective gastroenteritis and colitis, unspecified: Secondary | ICD-10-CM | POA: Diagnosis not present

## 2019-08-30 ENCOUNTER — Encounter: Payer: Self-pay | Admitting: Internal Medicine

## 2019-08-30 NOTE — Assessment & Plan Note (Signed)
Low carb diet and exercise.  Follow met b and a1c.   Lab Results  Component Value Date   HGBA1C 6.3 08/18/2019

## 2019-08-30 NOTE — Assessment & Plan Note (Signed)
Platelet count wnl last check.  Follow.  °

## 2019-08-30 NOTE — Assessment & Plan Note (Signed)
Doing better.  Still with fatigue, but overall improving.  Still with taste change.  Smell better.  Taste is gradually coming back.  Follow.

## 2019-08-30 NOTE — Assessment & Plan Note (Signed)
On aspirin.  Follow.  

## 2019-08-30 NOTE — Assessment & Plan Note (Signed)
Discussed calculated cholesterol risk.  Discussed starting crestor. She is agreeable.  Low cholesterol diet and exercise.  Follow lipid panel and liver function tests.

## 2019-08-30 NOTE — Assessment & Plan Note (Signed)
Overall handling stress relatively well.  Follow.  

## 2019-09-08 ENCOUNTER — Other Ambulatory Visit: Payer: Self-pay | Admitting: Internal Medicine

## 2019-09-15 DIAGNOSIS — L918 Other hypertrophic disorders of the skin: Secondary | ICD-10-CM | POA: Diagnosis not present

## 2019-09-15 DIAGNOSIS — L720 Epidermal cyst: Secondary | ICD-10-CM | POA: Diagnosis not present

## 2019-09-15 DIAGNOSIS — Z872 Personal history of diseases of the skin and subcutaneous tissue: Secondary | ICD-10-CM | POA: Diagnosis not present

## 2019-09-15 DIAGNOSIS — L821 Other seborrheic keratosis: Secondary | ICD-10-CM | POA: Diagnosis not present

## 2019-09-15 DIAGNOSIS — D1801 Hemangioma of skin and subcutaneous tissue: Secondary | ICD-10-CM | POA: Diagnosis not present

## 2019-09-15 DIAGNOSIS — Z85828 Personal history of other malignant neoplasm of skin: Secondary | ICD-10-CM | POA: Diagnosis not present

## 2019-09-15 DIAGNOSIS — L578 Other skin changes due to chronic exposure to nonionizing radiation: Secondary | ICD-10-CM | POA: Diagnosis not present

## 2019-09-15 DIAGNOSIS — Z86018 Personal history of other benign neoplasm: Secondary | ICD-10-CM | POA: Diagnosis not present

## 2019-09-27 ENCOUNTER — Other Ambulatory Visit: Payer: Self-pay | Admitting: Internal Medicine

## 2019-10-01 ENCOUNTER — Other Ambulatory Visit: Payer: Self-pay

## 2019-10-01 ENCOUNTER — Other Ambulatory Visit (INDEPENDENT_AMBULATORY_CARE_PROVIDER_SITE_OTHER): Payer: PPO

## 2019-10-01 ENCOUNTER — Encounter: Payer: Self-pay | Admitting: Internal Medicine

## 2019-10-01 DIAGNOSIS — E7800 Pure hypercholesterolemia, unspecified: Secondary | ICD-10-CM | POA: Diagnosis not present

## 2019-10-01 LAB — HEPATIC FUNCTION PANEL
ALT: 24 U/L (ref 0–35)
AST: 25 U/L (ref 0–37)
Albumin: 4.2 g/dL (ref 3.5–5.2)
Alkaline Phosphatase: 50 U/L (ref 39–117)
Bilirubin, Direct: 0.2 mg/dL (ref 0.0–0.3)
Total Bilirubin: 0.8 mg/dL (ref 0.2–1.2)
Total Protein: 6.6 g/dL (ref 6.0–8.3)

## 2019-10-06 DIAGNOSIS — H401111 Primary open-angle glaucoma, right eye, mild stage: Secondary | ICD-10-CM | POA: Diagnosis not present

## 2019-10-12 ENCOUNTER — Other Ambulatory Visit: Payer: Self-pay

## 2019-10-12 ENCOUNTER — Ambulatory Visit: Payer: PPO | Admitting: Podiatry

## 2019-10-12 ENCOUNTER — Encounter: Payer: Self-pay | Admitting: Podiatry

## 2019-10-12 DIAGNOSIS — E119 Type 2 diabetes mellitus without complications: Secondary | ICD-10-CM | POA: Diagnosis not present

## 2019-10-12 DIAGNOSIS — M79674 Pain in right toe(s): Secondary | ICD-10-CM | POA: Diagnosis not present

## 2019-10-12 DIAGNOSIS — M2012 Hallux valgus (acquired), left foot: Secondary | ICD-10-CM | POA: Diagnosis not present

## 2019-10-12 DIAGNOSIS — B351 Tinea unguium: Secondary | ICD-10-CM | POA: Diagnosis not present

## 2019-10-12 NOTE — Progress Notes (Signed)
This patient returns to my office for at risk foot care.  This patient requires this care by a professional since this patient will be at risk due to having diabetes.    This patient is unable to cut nails herself since the patient cannot reach her nails.These nails are painful walking and wearing shoes.  This patient presents for at risk foot care today.  General Appearance  Alert, conversant and in no acute stress.  Vascular  Dorsalis pedis and posterior tibial  pulses are palpable  bilaterally.  Capillary return is within normal limits  bilaterally. Temperature is within normal limits  bilaterally.  Neurologic  Senn-Weinstein monofilament wire test within normal limits  bilaterally. Muscle power within normal limits bilaterally.  Nails Thick disfigured discolored nails with subungual debris  from hallux to fifth toes bilaterally. No evidence of bacterial infection or drainage bilaterally.  Orthopedic  No limitations of motion  feet .  No crepitus or effusions noted.  No bony pathology or digital deformities noted. Plantar flexed 2,3 right foot.  Skin  normotropic skin with no porokeratosis noted bilaterally.  No signs of infections or ulcers noted.     Onychomycosis  Pain in right toes  Pain in left toes  Consent was obtained for treatment procedures.   Mechanical debridement of nails 1-5  bilaterally performed with a nail nipper.  Filed with dremel without incident.    Return office visit  4 months                   Told patient to return for periodic foot care and evaluation due to potential at risk complications.   Gardiner Barefoot DPM

## 2019-11-27 ENCOUNTER — Other Ambulatory Visit: Payer: Self-pay

## 2019-11-27 ENCOUNTER — Other Ambulatory Visit
Admission: RE | Admit: 2019-11-27 | Discharge: 2019-11-27 | Disposition: A | Discharge status: Home / Self Care | Payer: PPO | Source: Ambulatory Visit | Attending: Internal Medicine | Admitting: Internal Medicine

## 2019-11-27 DIAGNOSIS — Z01812 Encounter for preprocedural laboratory examination: Secondary | ICD-10-CM | POA: Insufficient documentation

## 2019-11-27 DIAGNOSIS — Z20822 Contact with and (suspected) exposure to covid-19: Secondary | ICD-10-CM | POA: Diagnosis not present

## 2019-11-27 LAB — SARS CORONAVIRUS 2 (TAT 6-24 HRS): SARS Coronavirus 2: NEGATIVE

## 2019-12-01 ENCOUNTER — Other Ambulatory Visit: Payer: PPO

## 2019-12-01 ENCOUNTER — Encounter: Payer: Self-pay | Admitting: Internal Medicine

## 2019-12-02 ENCOUNTER — Encounter: Payer: Self-pay | Admitting: Internal Medicine

## 2019-12-02 ENCOUNTER — Ambulatory Visit: Payer: PPO | Admitting: Anesthesiology

## 2019-12-02 ENCOUNTER — Ambulatory Visit
Admission: RE | Admit: 2019-12-02 | Discharge: 2019-12-02 | Disposition: A | Discharge status: Home / Self Care | Payer: PPO | Attending: Internal Medicine | Admitting: Internal Medicine

## 2019-12-02 ENCOUNTER — Encounter
Admission: RE | Disposition: A | Discharge status: Home / Self Care | Payer: Self-pay | Source: Home / Self Care | Attending: Internal Medicine

## 2019-12-02 DIAGNOSIS — Z7982 Long term (current) use of aspirin: Secondary | ICD-10-CM | POA: Diagnosis not present

## 2019-12-02 DIAGNOSIS — L818 Other specified disorders of pigmentation: Secondary | ICD-10-CM | POA: Diagnosis not present

## 2019-12-02 DIAGNOSIS — Z8673 Personal history of transient ischemic attack (TIA), and cerebral infarction without residual deficits: Secondary | ICD-10-CM | POA: Diagnosis not present

## 2019-12-02 DIAGNOSIS — E119 Type 2 diabetes mellitus without complications: Secondary | ICD-10-CM | POA: Insufficient documentation

## 2019-12-02 DIAGNOSIS — Z79899 Other long term (current) drug therapy: Secondary | ICD-10-CM | POA: Insufficient documentation

## 2019-12-02 DIAGNOSIS — Z1211 Encounter for screening for malignant neoplasm of colon: Secondary | ICD-10-CM | POA: Diagnosis not present

## 2019-12-02 DIAGNOSIS — Z86010 Personal history of colonic polyps: Secondary | ICD-10-CM | POA: Insufficient documentation

## 2019-12-02 DIAGNOSIS — K5730 Diverticulosis of large intestine without perforation or abscess without bleeding: Secondary | ICD-10-CM | POA: Insufficient documentation

## 2019-12-02 DIAGNOSIS — E1159 Type 2 diabetes mellitus with other circulatory complications: Secondary | ICD-10-CM | POA: Diagnosis not present

## 2019-12-02 DIAGNOSIS — E7800 Pure hypercholesterolemia, unspecified: Secondary | ICD-10-CM | POA: Insufficient documentation

## 2019-12-02 DIAGNOSIS — K5790 Diverticulosis of intestine, part unspecified, without perforation or abscess without bleeding: Secondary | ICD-10-CM | POA: Diagnosis not present

## 2019-12-02 HISTORY — DX: Personal history of adenomatous and serrated colon polyps: Z86.0101

## 2019-12-02 HISTORY — DX: Polyp of colon: K63.5

## 2019-12-02 HISTORY — PX: COLONOSCOPY WITH PROPOFOL: SHX5780

## 2019-12-02 HISTORY — DX: Personal history of colonic polyps: Z86.010

## 2019-12-02 SURGERY — COLONOSCOPY WITH PROPOFOL
Anesthesia: General

## 2019-12-02 MED ORDER — LIDOCAINE HCL (CARDIAC) PF 100 MG/5ML IV SOSY
PREFILLED_SYRINGE | INTRAVENOUS | Status: DC | PRN
  Administered 2019-12-02: 25 mg via INTRAVENOUS

## 2019-12-02 MED ORDER — PROPOFOL 10 MG/ML IV BOLUS
INTRAVENOUS | Status: DC | PRN
  Administered 2019-12-02: 50 mg via INTRAVENOUS
  Administered 2019-12-02: 20 mg via INTRAVENOUS
  Administered 2019-12-02: 50 mg via INTRAVENOUS

## 2019-12-02 MED ORDER — PROPOFOL 10 MG/ML IV BOLUS
INTRAVENOUS | Status: AC
  Filled 2019-12-02: qty 20

## 2019-12-02 MED ORDER — PROPOFOL 500 MG/50ML IV EMUL
INTRAVENOUS | Status: DC | PRN
  Administered 2019-12-02: 125 ug/kg/min via INTRAVENOUS

## 2019-12-02 MED ORDER — PROPOFOL 500 MG/50ML IV EMUL
INTRAVENOUS | Status: AC
  Filled 2019-12-02: qty 50

## 2019-12-02 MED ORDER — SODIUM CHLORIDE 0.9 % IV SOLN
INTRAVENOUS | Status: DC
  Administered 2019-12-02: 1000 mL via INTRAVENOUS

## 2019-12-02 MED ORDER — LIDOCAINE HCL (PF) 2 % IJ SOLN
INTRAMUSCULAR | Status: AC
  Filled 2019-12-02: qty 5

## 2019-12-02 NOTE — H&P (Signed)
Outpatient short stay form Pre-procedure 12/02/2019 11:28 AM Victoria Harris K. Victoria Harris, M.D.  Primary Physician: Victoria Harris, M.D.  Reason for visit:  Personal history of colon adenomatous polyps.  History of present illness:                            Patient presents for colonoscopy for a personal hx of colon polyps. The patient denies abdominal pain, abnormal weight loss or rectal bleeding.     No current facility-administered medications for this encounter.  Current Outpatient Medications:  .  aspirin 81 MG tablet, Take 81 mg by mouth daily., Disp: , Rfl:  .  Cholecalciferol (VITAMIN D-1000 MAX ST) 1000 units tablet, Take 1,000 Units by mouth daily. , Disp: , Rfl:  .  colestipol (COLESTID) 1 g tablet, Take by mouth., Disp: , Rfl:  .  dorzolamide-timolol (COSOPT) 22.3-6.8 MG/ML ophthalmic solution, Place 1 drop into both eyes 2 (two) times daily., Disp: , Rfl: 5 .  Multiple Vitamins-Minerals (MULTIVITAMIN ADULT PO), Take by mouth daily., Disp: , Rfl:  .  Omega-3 Fatty Acids (OMEGA-3 FISH OIL) 1200 MG CAPS, Take by mouth., Disp: , Rfl:  .  ONETOUCH VERIO test strip, , Disp: , Rfl:  .  rosuvastatin (CRESTOR) 5 MG tablet, TAKE 1 TABLET BY MOUTH EVERY DAY, Disp: 90 tablet, Rfl: 1 .  Saccharomyces boulardii (FLORASTOR PO), Take by mouth daily. , Disp: , Rfl:  .  TRAVATAN Z 0.004 % SOLN ophthalmic solution, Place 1 drop into both eyes at bedtime., Disp: , Rfl:  .  UNABLE TO FIND, Take by mouth., Disp: , Rfl:   No medications prior to admission.     Allergies  Allergen Reactions  . No Known Drug Allergy      Past Medical History:  Diagnosis Date  . Colon polyps    hyperplastic  . Diverticulosis   . Hx of adenomatous colonic polyps    tubular adenoma  . Hypercholesterolemia   . Serrated adenoma of colon   . Stroke (cerebrum) ([place])   . Thrombocytopenia ([place])   . Uterus descensus     Review of systems:  Otherwise negative.    Physical Exam  Gen: Alert, oriented. Appears  stated age.  HEENT: [place]/AT. PERRLA. Lungs: CTA, no wheezes. CV: RR nl S1, S2. Abd: soft, benign, no masses. BS+ Ext: No edema. Pulses 2+    Planned procedures: Proceed with colonoscopy. The patient understands the nature of the planned procedure, indications, risks, alternatives and potential complications including but not limited to bleeding, infection, perforation, damage to internal organs and possible oversedation/side effects from anesthesia. The patient agrees and gives consent to proceed.  Please refer to procedure notes for findings, recommendations and patient disposition/instructions.     Victoria Harris K. Victoria Harris, M.D. Gastroenterology 12/02/2019  11:28 AM

## 2019-12-02 NOTE — Transfer of Care (Signed)
Immediate Anesthesia Transfer of Care Note  Patient: Victoria Harris  Procedure(s) Performed: COLONOSCOPY WITH PROPOFOL (N/A )  Patient Location: PACU and Endoscopy Unit  Anesthesia Type:General  Level of Consciousness: drowsy  Airway & Oxygen Therapy: Patient Spontanous Breathing  Post-op Assessment: Report given to RN  Post vital signs: stable  Last Vitals:  Vitals Value Taken Time  BP 89/58 12/02/19 1313  Temp 36.1 C 12/02/19 1313  Pulse 63 12/02/19 1313  Resp 16 12/02/19 1313  SpO2 94 % 12/02/19 1313  Vitals shown include unvalidated device data.  Last Pain:  Vitals:   12/02/19 1313  TempSrc: Temporal  PainSc:          Complications: No complications documented.

## 2019-12-02 NOTE — Op Note (Signed)
[facility] Gastroenterology Patient Name: Victoria Harris Procedure Date: 12/02/2019 12:50 PM MRN: [id number] Account #: [id number] Date of Birth: 1950/10/26 Admit Type: Outpatient Age: 69 Room: [facility] ENDO ROOM 3 Gender: Female Note Status: Finalized Procedure:             Colonoscopy Indications:           Surveillance: Personal history of adenomatous polyps                         on last colonoscopy > 3 years ago Providers:             Lorie Apley K. Taylen Wendland MD, MD Medicines:             Propofol per Anesthesia Complications:         No immediate complications. Procedure:             Pre-Anesthesia Assessment:                        - The risks and benefits of the procedure and the                         sedation options and risks were discussed with the                         patient. All questions were answered and informed                         consent was obtained.                        - Patient identification and proposed procedure were                         verified prior to the procedure by the nurse. The                         procedure was verified in the procedure room.                        - ASA Grade Assessment: III - A patient with severe                         systemic disease.                        - After reviewing the risks and benefits, the patient                         was deemed in satisfactory condition to undergo the                         procedure.                        After obtaining informed consent, the colonoscope was                         passed under direct vision. Throughout the procedure,  the patient's blood pressure, pulse, and oxygen                         saturations were monitored continuously. The                         Colonoscope was introduced through the anus and                         advanced to the the cecum, identified by appendiceal                         orifice and  ileocecal valve. The colonoscopy was                         performed without difficulty. The patient tolerated                         the procedure well. The quality of the bowel                         preparation was good. The ileocecal valve, appendiceal                         orifice, and rectum were photographed. Findings:      The perianal and digital rectal examinations were normal. Pertinent       negatives include normal sphincter tone and no palpable rectal lesions.      Multiple small and large-mouthed diverticula were found in the entire       colon. There was no evidence of diverticular bleeding.      A tattoo was seen in the proximal ascending colon. The tattoo site       appeared normal.      The exam was otherwise without abnormality on direct and retroflexion       views. Impression:            - Moderate diverticulosis in the entire examined                         colon. There was no evidence of diverticular bleeding.                        - The examination was otherwise normal on direct and                         retroflexion views.                        - No specimens collected. Recommendation:        - Patient has a contact number available for                         emergencies. The signs and symptoms of potential                         delayed complications were discussed with the patient.                         Return to  normal activities tomorrow. Written                         discharge instructions were provided to the patient.                        - Resume previous diet.                        - Continue present medications.                        - Repeat colonoscopy in 5 years for surveillance.                        - Return to GI office PRN.                        - The findings and recommendations were discussed with                         the patient. Procedure Code(s):     --- Professional ---                        M0349, Colorectal  cancer screening; colonoscopy on                         individual at high risk Diagnosis Code(s):     --- Professional ---                        K57.30, Diverticulosis of large intestine without                         perforation or abscess without bleeding                        Z86.010, Personal history of colonic polyps CPT copyright 2019 American Medical Association. All rights reserved. The codes documented in this report are preliminary and upon coder review may  be revised to meet current compliance requirements. Efrain Sella MD, MD 12/02/2019 1:12:36 PM This report has been signed electronically. Number of Addenda: 0 Note Initiated On: 12/02/2019 12:50 PM Scope Withdrawal Time: 0 hours 7 minutes 0 seconds  Total Procedure Duration: 0 hours 11 minutes 1 second  Estimated Blood Loss:  Estimated blood loss: none. Estimated blood loss: none.      [facility]

## 2019-12-02 NOTE — Interval H&P Note (Signed)
History and Physical Interval Note:  12/02/2019 12:48 PM  Victoria Harris  has presented today for surgery, with the diagnosis of phx polyps.  The various methods of treatment have been discussed with the patient and family. After consideration of risks, benefits and other options for treatment, the patient has consented to  Procedure(s): COLONOSCOPY WITH PROPOFOL (N/A) as a surgical intervention.  The patient's history has been reviewed, patient examined, no change in status, stable for surgery.  I have reviewed the patient's chart and labs.  Questions were answered to the patient's satisfaction.     [place], [place]

## 2019-12-02 NOTE — Anesthesia Preprocedure Evaluation (Signed)
Anesthesia Evaluation  Patient identified by MRN, date of birth, ID band Patient awake    Reviewed: Allergy & Precautions, H&P , NPO status , Patient's Chart, lab work & pertinent test results, reviewed documented beta blocker date and time   Airway Mallampati: II   Neck ROM: full    Dental  (+) Poor Dentition   Pulmonary neg pulmonary ROS,    Pulmonary exam normal        Cardiovascular negative cardio ROS Normal cardiovascular exam Rhythm:regular Rate:Normal     Neuro/Psych CVA, No Residual Symptoms negative neurological ROS  negative psych ROS   GI/Hepatic negative GI ROS, Neg liver ROS,   Endo/Other  diabetes  Renal/GU negative Renal ROS  negative genitourinary   Musculoskeletal   Abdominal   Peds  Hematology negative hematology ROS (+)   Anesthesia Other Findings Past Medical History: No date: Diverticulosis No date: Hypercholesterolemia No date: Serrated adenoma of colon No date: Stroke (cerebrum) (HCC) No date: Thrombocytopenia ([place]) No date: Uterus descensus Past Surgical History: 2009: ABDOMINAL HYSTERECTOMY 2007: CHOLECYSTECTOMY 04/05/2015: COLONOSCOPY N/A     Comment: Procedure: COLONOSCOPY;  Surgeon: Lollie Sails, MD;  Location: [facility] ENDOSCOPY;                Service: Endoscopy;  Laterality: N/A; 2001 and 2006: [place] OF UTERUS 2007: FOOT SURGERY 1960: TONSILLECTOMY No date: TUBAL LIGATION No date: vocal cord cyst removed BMI    Body Mass Index:  30.12 kg/m     Reproductive/Obstetrics negative OB ROS                             Anesthesia Physical  Anesthesia Plan  ASA: III  Anesthesia Plan: General   Post-op Pain Management:    Induction:   PONV Risk Score and Plan: Propofol infusion  Airway Management Planned: Nasal Cannula  Additional Equipment:   Intra-op Plan:   Post-operative Plan:   Informed Consent: I  have reviewed the patients History and Physical, chart, labs and discussed the procedure including the risks, benefits and alternatives for the proposed anesthesia with the patient or authorized representative who has indicated his/her understanding and acceptance.     Dental Advisory Given  Plan Discussed with: CRNA  Anesthesia Plan Comments:         Anesthesia Quick Evaluation

## 2019-12-03 ENCOUNTER — Ambulatory Visit
Admission: RE | Admit: 2019-12-03 | Discharge: 2019-12-03 | Disposition: A | Discharge status: Home / Self Care | Payer: PPO | Source: Ambulatory Visit | Attending: Internal Medicine | Admitting: Internal Medicine

## 2019-12-03 DIAGNOSIS — Z780 Asymptomatic menopausal state: Secondary | ICD-10-CM | POA: Diagnosis not present

## 2019-12-03 DIAGNOSIS — Z1231 Encounter for screening mammogram for malignant neoplasm of breast: Secondary | ICD-10-CM | POA: Diagnosis not present

## 2019-12-03 DIAGNOSIS — E2839 Other primary ovarian failure: Secondary | ICD-10-CM | POA: Insufficient documentation

## 2019-12-03 NOTE — Anesthesia Postprocedure Evaluation (Signed)
Anesthesia Post Note  Patient: Victoria Harris  Procedure(s) Performed: COLONOSCOPY WITH PROPOFOL (N/A )  Patient location during evaluation: Endoscopy Anesthesia Type: General Level of consciousness: awake and alert and oriented Pain management: pain level controlled Vital Signs Assessment: post-procedure vital signs reviewed and stable Respiratory status: spontaneous breathing Cardiovascular status: blood pressure returned to baseline Anesthetic complications: no   No complications documented.   Last Vitals:  Vitals:   12/02/19 1313 12/02/19 1323  BP: (!) 89/58 97/66  Pulse: 63   Resp: 17   Temp: (!) 36.1 C   SpO2: 93%     Last Pain:  Vitals:   12/03/19 0733  TempSrc:   PainSc: 0-No pain                 Tandrea Kommer

## 2019-12-11 ENCOUNTER — Telehealth: Payer: Self-pay | Admitting: *Deleted

## 2019-12-11 DIAGNOSIS — E1159 Type 2 diabetes mellitus with other circulatory complications: Secondary | ICD-10-CM

## 2019-12-11 DIAGNOSIS — E7800 Pure hypercholesterolemia, unspecified: Secondary | ICD-10-CM

## 2019-12-11 DIAGNOSIS — D696 Thrombocytopenia, unspecified: Secondary | ICD-10-CM

## 2019-12-11 NOTE — Telephone Encounter (Signed)
Please place future orders for lab appt.  

## 2019-12-12 NOTE — Telephone Encounter (Signed)
Order placed for upcoming labs.

## 2019-12-14 ENCOUNTER — Other Ambulatory Visit: Payer: Self-pay

## 2019-12-14 ENCOUNTER — Other Ambulatory Visit (INDEPENDENT_AMBULATORY_CARE_PROVIDER_SITE_OTHER): Payer: PPO

## 2019-12-14 DIAGNOSIS — E7800 Pure hypercholesterolemia, unspecified: Secondary | ICD-10-CM | POA: Diagnosis not present

## 2019-12-14 DIAGNOSIS — D696 Thrombocytopenia, unspecified: Secondary | ICD-10-CM

## 2019-12-14 DIAGNOSIS — E1159 Type 2 diabetes mellitus with other circulatory complications: Secondary | ICD-10-CM | POA: Diagnosis not present

## 2019-12-14 LAB — BASIC METABOLIC PANEL
BUN: 17 mg/dL (ref 6–23)
CO2: 30 mEq/L (ref 19–32)
Calcium: 9.5 mg/dL (ref 8.4–10.5)
Chloride: 108 mEq/L (ref 96–112)
Creatinine, Ser: 0.64 mg/dL (ref 0.40–1.20)
GFR: 91.72 mL/min (ref >60.00)
Glucose, Bld: 144 mg/dL — ABNORMAL HIGH (ref 70–99)
Potassium: 4.3 mEq/L (ref 3.5–5.1)
Sodium: 143 mEq/L (ref 135–145)

## 2019-12-14 LAB — HEPATIC FUNCTION PANEL
ALT: 23 U/L (ref 0–35)
AST: 25 U/L (ref 0–37)
Albumin: 4.3 g/dL (ref 3.5–5.2)
Alkaline Phosphatase: 56 U/L (ref 39–117)
Bilirubin, Direct: 0.1 mg/dL (ref 0.0–0.3)
Total Bilirubin: 0.8 mg/dL (ref 0.2–1.2)
Total Protein: 6.3 g/dL (ref 6.0–8.3)

## 2019-12-14 LAB — MICROALBUMIN / CREATININE URINE RATIO
Creatinine,U: 110.6 mg/dL
Microalb Creat Ratio: 0.6 mg/g (ref 0.0–30.0)
Microalb, Ur: <0.7 mg/dL (ref 0.0–1.9)

## 2019-12-14 LAB — CBC WITH DIFFERENTIAL/PLATELET
Basophils Absolute: 0 10*3/uL (ref 0.0–0.1)
Basophils Relative: 0.4 % (ref 0.0–3.0)
Eosinophils Absolute: 0.1 10*3/uL (ref 0.0–0.7)
Eosinophils Relative: 1.4 % (ref 0.0–5.0)
HCT: 42 % (ref 36.0–46.0)
Hemoglobin: 14.6 g/dL (ref 12.0–15.0)
Lymphocytes Relative: 28.2 % (ref 12.0–46.0)
Lymphs Abs: 1.4 10*3/uL (ref 0.7–4.0)
MCHC: 34.8 g/dL (ref 30.0–36.0)
MCV: 93.2 fl (ref 78.0–100.0)
Monocytes Absolute: 0.3 10*3/uL (ref 0.1–1.0)
Monocytes Relative: 5.6 % (ref 3.0–12.0)
Neutro Abs: 3.3 10*3/uL (ref 1.4–7.7)
Neutrophils Relative %: 64.4 % (ref 43.0–77.0)
Platelets: 152 10*3/uL (ref 150.0–400.0)
RBC: 4.5 Mil/uL (ref 3.87–5.11)
RDW: 13 % (ref 11.5–15.5)
WBC: 5.1 10*3/uL (ref 4.0–10.5)

## 2019-12-14 LAB — LIPID PANEL
Cholesterol: 173 mg/dL (ref 0–200)
HDL: 65.5 mg/dL (ref >39.00)
LDL Cholesterol: 78 mg/dL (ref 0–99)
NonHDL: 107.31
Total CHOL/HDL Ratio: 3
Triglycerides: 148 mg/dL (ref 0.0–149.0)
VLDL: 29.6 mg/dL (ref 0.0–40.0)

## 2019-12-14 LAB — HEMOGLOBIN A1C: Hgb A1c MFr Bld: 6.9 % — ABNORMAL HIGH (ref 4.6–6.5)

## 2019-12-17 ENCOUNTER — Other Ambulatory Visit: Payer: PPO

## 2019-12-22 ENCOUNTER — Other Ambulatory Visit: Payer: Self-pay

## 2019-12-22 ENCOUNTER — Ambulatory Visit (INDEPENDENT_AMBULATORY_CARE_PROVIDER_SITE_OTHER): Payer: PPO | Admitting: Internal Medicine

## 2019-12-22 ENCOUNTER — Telehealth: Payer: Self-pay | Admitting: Internal Medicine

## 2019-12-22 ENCOUNTER — Encounter: Payer: Self-pay | Admitting: Internal Medicine

## 2019-12-22 VITALS — BP 120/78 | HR 68 | Temp 98.1°F | Resp 16 | Ht 66.0 in | Wt 177.8 lb

## 2019-12-22 DIAGNOSIS — Z8616 Personal history of COVID-19: Secondary | ICD-10-CM

## 2019-12-22 DIAGNOSIS — Z8673 Personal history of transient ischemic attack (TIA), and cerebral infarction without residual deficits: Secondary | ICD-10-CM | POA: Diagnosis not present

## 2019-12-22 DIAGNOSIS — E1159 Type 2 diabetes mellitus with other circulatory complications: Secondary | ICD-10-CM | POA: Diagnosis not present

## 2019-12-22 DIAGNOSIS — D696 Thrombocytopenia, unspecified: Secondary | ICD-10-CM | POA: Diagnosis not present

## 2019-12-22 DIAGNOSIS — E7800 Pure hypercholesterolemia, unspecified: Secondary | ICD-10-CM

## 2019-12-22 DIAGNOSIS — Z0000 Encounter for general adult medical examination without abnormal findings: Secondary | ICD-10-CM

## 2019-12-22 NOTE — Telephone Encounter (Signed)
Pt wanted to check on nutrition classes for diabetes

## 2019-12-22 NOTE — Progress Notes (Signed)
[name], female   DOB: 22-Jun-1950, 69 y.o.   MRN: [id number]   Subjective:    [name], female    DOB: 1950-12-09, 69 y.o.   MRN: [id number]  HPI This visit occurred during the SARS-CoV-2 public health emergency.  Safety protocols were in place, including screening questions prior to the visit, additional usage of staff PPE, and extensive cleaning of exam room while observing appropriate contact time as indicated for disinfecting solutions.  Patient here for a scheduled follow up.  She is doing well. Feels good.  Staying active.  No chest pain or sob reported.  No abdominal pain.  Bowels moving.  Trying to watch her diet.  Has been eating tomato sandwiches, etc, but overall does well with diet.  Not as active due to hot weather.  Discussed labs.  a1c 6.9. Cholesterol improved.  Tolerating crestor 3 days per week.    Past Medical History:  Diagnosis Date  . Colon polyps    hyperplastic  . Diverticulosis   . Hx of adenomatous colonic polyps    tubular adenoma  . Hypercholesterolemia   . Serrated adenoma of colon   . Stroke (cerebrum) ([place])   . Thrombocytopenia ([place])   . Uterus descensus    Past Surgical History:  Procedure Laterality Date  . ABDOMINAL HYSTERECTOMY  2009  . CHOLECYSTECTOMY  2007  . COLONOSCOPY N/A 04/05/2015   Procedure: COLONOSCOPY;  Surgeon: Lollie Sails, MD;  Location: [facility] ENDOSCOPY;  Service: Endoscopy;  Laterality: N/A;  . COLONOSCOPY WITH PROPOFOL N/A 10/09/2016   Procedure: COLONOSCOPY WITH PROPOFOL;  Surgeon: Lollie Sails, MD;  Location: [facility] ENDOSCOPY;  Service: Endoscopy;  Laterality: N/A;  . COLONOSCOPY WITH PROPOFOL N/A 12/02/2019   Procedure: COLONOSCOPY WITH PROPOFOL;  Surgeon: Toledo, Benay Pike, MD;  Location: [facility] ENDOSCOPY;  Service: Gastroenterology;  Laterality: N/A;  . DILATION AND CURETTAGE OF UTERUS  2001 and 2006  . FOOT SURGERY  2007  . TONSILLECTOMY  1960  . TUBAL LIGATION    . vocal  cord cyst removed     Family History  Problem Relation Age of Onset  . Diabetes Other   . Colon cancer Mother   . Diabetes Mother   . Kidney disease Mother   . Heart disease Mother   . Stroke Paternal Grandmother   . Stroke Paternal Grandfather   . Prostate cancer Father   . Diabetes Father   . Glaucoma Father   . Breast cancer Paternal Aunt        x2  . Uterine cancer Maternal Aunt   . Diabetes Brother   . Diabetes Brother    Social History   Socioeconomic History  . Marital status: Married    Spouse name: Not on file  . Number of children: Not on file  . Years of education: Not on file  . Highest education level: Not on file  Occupational History  . Not on file  Tobacco Use  . Smoking status: Never Smoker  . Smokeless tobacco: Never Used  Vaping Use  . Vaping Use: Never used  Substance and Sexual Activity  . Alcohol use: No    Alcohol/week: 0.0 standard drinks  . Drug use: No  . Sexual activity: Never  Other Topics Concern  . Not on file  Social History Narrative  . Not on file   Social Determinants of Health   Financial Resource Strain:   . Difficulty of Paying Living Expenses:   Food Insecurity:   .  Worried About Programme researcher, broadcasting/film/video in the Last Year:   . Barista in the Last Year:   Transportation Needs:   . Freight forwarder (Medical):   Marland Kitchen Lack of Transportation (Non-Medical):   Physical Activity:   . Days of Exercise per Week:   . Minutes of Exercise per Session:   Stress:   . Feeling of Stress :   Social Connections:   . Frequency of Communication with Friends and Family:   . Frequency of Social Gatherings with Friends and Family:   . Attends Religious Services:   . Active Member of Clubs or Organizations:   . Attends Banker Meetings:   Marland Kitchen Marital Status:     Outpatient Encounter Medications as of 12/22/2019  Medication Sig  . aspirin 81 MG tablet Take 81 mg by mouth daily.  . Cholecalciferol (VITAMIN D-1000 MAX  ST) 1000 units tablet Take 1,000 Units by mouth daily.   . colestipol (COLESTID) 1 g tablet Take by mouth.  . dorzolamide-timolol (COSOPT) 22.3-6.8 MG/ML ophthalmic solution Place 1 drop into both eyes 2 (two) times daily.  . Multiple Vitamins-Minerals (MULTIVITAMIN ADULT PO) Take by mouth daily.  . Omega-3 Fatty Acids (OMEGA-3 FISH OIL) 1200 MG CAPS Take by mouth.  Letta Pate VERIO test strip   . rosuvastatin (CRESTOR) 5 MG tablet TAKE 1 TABLET BY MOUTH EVERY DAY  . Saccharomyces boulardii (FLORASTOR PO) Take by mouth daily.   . TRAVATAN Z 0.004 % SOLN ophthalmic solution Place 1 drop into both eyes at bedtime.  Marland Kitchen UNABLE TO FIND Take by mouth.   No facility-administered encounter medications on file as of 12/22/2019.    Review of Systems  Constitutional: Negative for appetite change and unexpected weight change.  HENT: Negative for congestion and sinus pressure.   Eyes: Negative for pain and visual disturbance.  Respiratory: Negative for cough, chest tightness and shortness of breath.   Cardiovascular: Negative for chest pain, palpitations and leg swelling.  Gastrointestinal: Negative for abdominal pain, diarrhea, nausea and vomiting.  Genitourinary: Negative for difficulty urinating and dysuria.  Musculoskeletal: Negative for joint swelling and myalgias.  Skin: Negative for color change and rash.  Neurological: Negative for dizziness, light-headedness and headaches.  Hematological: Negative for adenopathy. Does not bruise/bleed easily.  Psychiatric/Behavioral: Negative for agitation and dysphoric mood.       Objective:    Physical Exam Vitals reviewed.  Constitutional:      General: She is not in acute distress.    Appearance: Normal appearance.  HENT:     Head: Normocephalic and atraumatic.     Right Ear: External ear normal.     Left Ear: External ear normal.  Eyes:     General: No scleral icterus.       Right eye: No discharge.        Left eye: No discharge.      Conjunctiva/sclera: Conjunctivae normal.  Neck:     Thyroid: No thyromegaly.  Cardiovascular:     Rate and Rhythm: Normal rate and regular rhythm.  Pulmonary:     Effort: No respiratory distress.     Breath sounds: Normal breath sounds. No wheezing.  Abdominal:     General: Bowel sounds are normal.     Palpations: Abdomen is soft.     Tenderness: There is no abdominal tenderness.  Musculoskeletal:        General: No swelling or tenderness.     Cervical back: Neck supple. No tenderness.  Lymphadenopathy:  Cervical: No cervical adenopathy.  Skin:    Findings: No erythema or rash.  Neurological:     Mental Status: She is alert.  Psychiatric:        Mood and Affect: Mood normal.        Behavior: Behavior normal.     BP 120/78   Pulse 68   Temp 98.1 F (36.7 C)   Resp 16   Ht 5' 6"  (1.676 m)   Wt 177 lb 12.8 oz (80.6 kg)   SpO2 98%   BMI 28.70 kg/m  Wt Readings from Last 3 Encounters:  12/22/19 177 lb 12.8 oz (80.6 kg)  12/02/19 173 lb (78.5 kg)  08/20/19 173 lb 3.2 oz (78.6 kg)     Lab Results  Component Value Date   WBC 5.1 12/14/2019   HGB 14.6 12/14/2019   HCT 42.0 12/14/2019   PLT 152.0 12/14/2019   GLUCOSE 144 (H) 12/14/2019   CHOL 173 12/14/2019   TRIG 148.0 12/14/2019   HDL 65.50 12/14/2019   LDLCALC 78 12/14/2019   ALT 23 12/14/2019   AST 25 12/14/2019   NA 143 12/14/2019   K 4.3 12/14/2019   CL 108 12/14/2019   CREATININE 0.64 12/14/2019   BUN 17 12/14/2019   CO2 30 12/14/2019   TSH 2.07 08/18/2019   HGBA1C 6.9 (H) 12/14/2019   MICROALBUR <0.7 12/14/2019    DG Bone Density  Result Date: 12/03/2019 EXAM: DUAL X-RAY ABSORPTIOMETRY (DXA) FOR BONE MINERAL DENSITY IMPRESSION: Your patient Faatima Tench completed a BMD test on 12/03/2019 using the [employer] (software version: 14.10) manufactured by [employer]. The following summarizes the results of our evaluation. Technologist:vlm PATIENT BIOGRAPHICAL: Name:  Kyndle, Schlender Patient ID: [id number] Birth Date: May 18, 1951 Height: 64.5 in. Gender: Female Exam Date: 12/03/2019 Weight: 176.0 lbs. Indications: Caucasian, Diabetic, Postmenopausal Fractures: Foot Treatments: Calcium, Vitamin D DENSITOMETRY RESULTS: Site         Region     Measured Date Measured Age WHO Classification Young Adult T-score BMD         %Change vs. Previous Significant Change (*) DualFemur Neck Right 12/03/2019 69.0 Normal -0.8 0.923 g/cm2 - - Left Forearm Radius 33% 12/03/2019 70.0 Normal -0.2 0.862 g/cm2 - - ASSESSMENT: The BMD measured at Femur Neck Right is 0.923 g/cm2 with a T-score of -0.8. This patient is considered NORMAL according to [facility] criteria. Lumbar spine was not utilized due to advanced degenerative changes. The scan quality is good. World Pharmacologist [facility]) criteria for post-menopausal, Caucasian Women: Normal:                   T-score at or above -1 SD Osteopenia/low bone mass: T-score between -1 and -2.5 SD Osteoporosis:             T-score at or below -2.5 SD RECOMMENDATIONS: 1. All patients should optimize calcium and vitamin D intake. 2. Consider FDA-approved medical therapies in postmenopausal women and men aged 2 years and older, based on the following: a. A hip or vertebral(clinical or morphometric) fracture b. T-score < -2.5 at the femoral neck or spine after appropriate evaluation to exclude secondary causes c. Low bone mass (T-score between -1.0 and -2.5 at the femoral neck or spine) and a 10-year probability of a hip fracture > 3% or a 10-year probability of a major osteoporosis-related fracture > 20% based on the [place]-adapted WHO algorithm 3. Clinician judgment and/or patient preferences may indicate treatment for people  with 10-year fracture probabilities above or below these levels FOLLOW-UP: People with diagnosed cases of osteoporosis or at high risk for fracture should have regular bone mineral density tests. For patients  eligible for Medicare, routine testing is allowed once every 2 years. The testing frequency can be increased to one year for patients who have rapidly progressing disease, those who are receiving or discontinuing medical therapy to restore bone mass, or have additional risk factors. I have reviewed this report, and agree with the above findings. Mark A. Thornton Papas, M.D. [facility] Electronically Signed   By: Lavonia Dana M.D.   On: 12/03/2019 14:27   MM 3D SCREEN BREAST BILATERAL  Result Date: 12/03/2019 CLINICAL DATA:  Screening. EXAM: DIGITAL SCREENING BILATERAL MAMMOGRAM WITH TOMO AND CAD COMPARISON:  Previous exam(s). ACR Breast Density Category b: There are scattered areas of fibroglandular density. FINDINGS: There are no findings suspicious for malignancy. Images were processed with CAD. IMPRESSION: No mammographic evidence of malignancy. A result letter of this screening mammogram will be mailed directly to the patient. RECOMMENDATION: Screening mammogram in one year. (Code:SM-B-01Y) BI-RADS CATEGORY  1: Negative. Electronically Signed   By: Lajean Manes M.D.   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  Agreeable to [facility] referral. Follow met b and a1c.   Lab Results  Component Value Date   HGBA1C 6.9 (H) 12/14/2019        Relevant Orders   Ambulatory referral to diabetic education   Hemoglobin C6O   Basic metabolic panel   Thrombocytopenia (HCC)    Last platelet count wnl.  Follow cbc.       Hypercholesteremia    She is tolerating crestor 3 days per week.  Low cholesterol diet and exercise.  Follow lipid panel and liver function tests.        Relevant Orders   Hepatic function panel   Lipid panel   History of CVA (cerebrovascular accident)    On aspirin.  Follow.       History of COVID-19    No sob.  No cough or  congestion.  Doing well.  Follow.       Health care maintenance    Physical today 08/20/19.  Mammogram 12/03/19 - Birads I.  Colonoscopy 12/02/19 - moderate diverticulosis otherwise normal.  Bone density 12/03/19 - normal.           Einar Pheasant, MD

## 2019-12-23 NOTE — Telephone Encounter (Signed)
Patient was wondering if she could be referred to the lifestyles for diabetes management and nutritional education. She says she knows several people that have went to this and it helped them

## 2019-12-23 NOTE — Telephone Encounter (Signed)
LMTCB

## 2019-12-23 NOTE — Telephone Encounter (Signed)
Order placed for [facility] referral.

## 2019-12-24 NOTE — Telephone Encounter (Signed)
Patient's husband aware °

## 2020-01-05 ENCOUNTER — Ambulatory Visit: Payer: PPO | Admitting: *Deleted

## 2020-01-10 ENCOUNTER — Encounter: Payer: Self-pay | Admitting: Internal Medicine

## 2020-01-10 NOTE — Assessment & Plan Note (Signed)
No sob.  No cough or congestion.  Doing well.  Follow.

## 2020-01-10 NOTE — Assessment & Plan Note (Signed)
On aspirin.  Follow.

## 2020-01-10 NOTE — Assessment & Plan Note (Signed)
Discussed diet and exercise.  Agreeable to Lifestyles referral. Follow met b and a1c.   Lab Results  Component Value Date   HGBA1C 6.9 (H) 12/14/2019

## 2020-01-10 NOTE — Assessment & Plan Note (Signed)
Last platelet count wnl.  Follow cbc.

## 2020-01-10 NOTE — Assessment & Plan Note (Signed)
Physical today 08/20/19.  Mammogram 12/03/19 - Birads I.  Colonoscopy 12/02/19 - moderate diverticulosis otherwise normal.  Bone density 12/03/19 - normal.

## 2020-01-10 NOTE — Assessment & Plan Note (Signed)
She is tolerating crestor 3 days per week.  Low cholesterol diet and exercise.  Follow lipid panel and liver function tests.

## 2020-01-19 ENCOUNTER — Ambulatory Visit: Payer: PPO | Admitting: *Deleted

## 2020-01-21 ENCOUNTER — Other Ambulatory Visit: Payer: Self-pay | Admitting: Internal Medicine

## 2020-02-15 ENCOUNTER — Ambulatory Visit: Payer: PPO | Admitting: Podiatry

## 2020-02-15 ENCOUNTER — Encounter: Payer: Self-pay | Admitting: Podiatry

## 2020-02-15 ENCOUNTER — Other Ambulatory Visit: Payer: Self-pay

## 2020-02-15 DIAGNOSIS — B351 Tinea unguium: Secondary | ICD-10-CM | POA: Diagnosis not present

## 2020-02-15 DIAGNOSIS — M79674 Pain in right toe(s): Secondary | ICD-10-CM

## 2020-02-15 DIAGNOSIS — E119 Type 2 diabetes mellitus without complications: Secondary | ICD-10-CM

## 2020-02-15 DIAGNOSIS — M2012 Hallux valgus (acquired), left foot: Secondary | ICD-10-CM | POA: Diagnosis not present

## 2020-02-15 NOTE — Progress Notes (Signed)
This patient returns to my office for at risk foot care.  This patient requires this care by a professional since this patient will be at risk due to having diabetes.    This patient is unable to cut nails herself since the patient cannot reach her nails.These nails are painful walking and wearing shoes.  This patient presents for at risk foot care today.  General Appearance  Alert, conversant and in no acute stress.  Vascular  Dorsalis pedis and posterior tibial  pulses are palpable  bilaterally.  Capillary return is within normal limits  bilaterally. Temperature is within normal limits  bilaterally.  Neurologic  Senn-Weinstein monofilament wire test within normal limits  bilaterally. Muscle power within normal limits bilaterally.  Nails Thick disfigured discolored nails with subungual debris  from hallux to fifth toes bilaterally. No evidence of bacterial infection or drainage bilaterally.  Orthopedic  No limitations of motion  feet .  No crepitus or effusions noted.  No bony pathology or digital deformities noted. Plantar flexed 2,3 right foot.  Skin  normotropic skin  noted bilaterally.  No signs of infections or ulcers noted.   Asymptomatic porokeratosis sub 2 right foot.  Onychomycosis  Pain in right toes  Pain in left toes  Consent was obtained for treatment procedures.   Mechanical debridement of nails 1-5  bilaterally performed with a nail nipper.  Filed with dremel without incident.    Return office visit  4 months                   Told patient to return for periodic foot care and evaluation due to potential at risk complications.   Gardiner Barefoot DPM

## 2020-03-15 ENCOUNTER — Encounter: Payer: Self-pay | Admitting: *Deleted

## 2020-03-15 ENCOUNTER — Other Ambulatory Visit: Payer: Self-pay

## 2020-03-15 ENCOUNTER — Encounter: Payer: PPO | Attending: Internal Medicine | Admitting: *Deleted

## 2020-03-15 VITALS — BP 120/82 | Ht 65.0 in | Wt 179.4 lb

## 2020-03-15 DIAGNOSIS — E1159 Type 2 diabetes mellitus with other circulatory complications: Secondary | ICD-10-CM | POA: Insufficient documentation

## 2020-03-15 DIAGNOSIS — E119 Type 2 diabetes mellitus without complications: Secondary | ICD-10-CM

## 2020-03-15 NOTE — Progress Notes (Signed)
Diabetes Self-Management Education  Visit Type: First/Initial  Appt. Start Time: 1115 Appt. End Time: 1230  03/15/2020  [name], identified by name and date of birth, is a 69 y.o. female with a diagnosis of Diabetes: Type 2.   ASSESSMENT  Blood pressure 120/82, height 5\' 5"  (1.651 m), weight 179 lb 6.4 oz (81.4 kg). Body mass index is 29.85 kg/m.   Diabetes Self-Management Education - 03/15/20 1312      Visit Information   Visit Type First/Initial      Initial Visit   Diabetes Type Type 2    Are you currently following a meal plan? No    Are you taking your medications as prescribed? Yes    Date Diagnosed 1 year ago      Health Coping   How would you rate your overall health? Good      Psychosocial Assessment   Patient Belief/Attitude about Diabetes Motivated to manage diabetes    Self-care barriers None    Self-management support Doctor's office;Family    Patient Concerns Nutrition/Meal planning;Glycemic Control;Monitoring    Special Needs None    Preferred Learning Style Auditory;Visual;Hands on    [place] in progress    How often do you need to have someone help you when you read instructions, pamphlets, or other written materials from your doctor or pharmacy? 1 - Never      Pre-Education Assessment   Patient understands the diabetes disease and treatment process. Needs Review    Patient understands incorporating nutritional management into lifestyle. Needs Instruction    Patient undertands incorporating physical activity into lifestyle. Needs Instruction    Patient understands using medications safely. Needs Instruction    Patient understands monitoring blood glucose, interpreting and using results Needs Review    Patient understands prevention, detection, and treatment of acute complications. Needs Instruction    Patient understands prevention, detection, and treatment of chronic complications. Needs Review    Patient understands  how to develop strategies to address psychosocial issues. Needs Instruction    Patient understands how to develop strategies to promote health/change behavior. Needs Instruction      Complications   Last HgB A1C per patient/outside source 6.9 %   12/14/2019   How often do you check your blood sugar? 1-2 times/day    Fasting Blood glucose range (mg/dL) 130-179   Pt reports FBG's average 140's mg/dL.   Have you had a dilated eye exam in the past 12 months? Yes    Have you had a dental exam in the past 12 months? Yes    Are you checking your feet? No      Dietary Intake   Breakfast oatmeal with blueberries and walnuts; Greek yogurt; SF bread with egg; wrap with egg or peanut butter and SF jelly    Snack (morning) peanut butter crackers, nuts; other fruits apple, peaches, strawberries    Lunch tuna salad sandwich or on crackers; tomato and ham sandwich; banana or chicken salad sandwich; chips; left overs or veggie plate    Dinner chicken, beef, pork, shrimp, oysters; sweet potato or fried potatoes, corn, beans, green beans, pasta, occasional rice, salads, lettuce tomatoes, broccoli, cauliflower, cuccumbers, carrots, squash, zucchini    Beverage(s) water, coffee, diet soda      Exercise   Exercise Type ADL's      Patient Education   Previous Diabetes Education No    Disease state  Definition of diabetes, type 1 and 2, and the diagnosis of diabetes;Factors that contribute  to the development of diabetes    Nutrition management  Role of diet in the treatment of diabetes and the relationship between the three main macronutrients and blood glucose level;Food label reading, portion sizes and measuring food.;Reviewed blood glucose goals for pre and post meals and how to evaluate the patients' food intake on their blood glucose level.;Information on hints to eating out and maintain blood glucose control.    Physical activity and exercise  Role of exercise on diabetes management, blood pressure control and  cardiac health.    Monitoring Purpose and frequency of SMBG.;Taught/discussed recording of test results and interpretation of SMBG.;Identified appropriate SMBG and/or A1C goals.    Chronic complications Relationship between chronic complications and blood glucose control    Psychosocial adjustment Role of stress on diabetes;Identified and addressed patients feelings and concerns about diabetes      Individualized Goals (developed by patient)   Reducing Risk Other (comment)   improve blood sugars, prevent diabetes complications     Outcomes   Expected Outcomes Demonstrated interest in learning. Expect positive outcomes    Future DMSE 2 wks           Individualized Plan for Diabetes Self-Management Training:   Learning Objective:  Patient will have a greater understanding of diabetes self-management. Patient education plan is to attend individual and/or group sessions per assessed needs and concerns.   Plan:   Patient Instructions  Check blood sugars 1 x day before breakfast or 2 hrs after one meal every day Bring blood sugar records to the next class Exercise: Walk for  10  minutes   3 days a week and gradually increase  Eat 3 meals day, 1-2 snacks a day Space meals 4-6 hours apart Limit fried foods  Expected Outcomes:  Demonstrated interest in learning. Expect positive outcomes  Education material provided:  General Meal Planning Guidelines Simple Meal Plan  If problems or questions, patient to contact team via:  Johny Drilling, RN, [place] [phone]  Future DSME appointment: 2 wks  March 31, 2020 for Diabetes Class 1

## 2020-03-15 NOTE — Patient Instructions (Signed)
Check blood sugars 1 x day before breakfast or 2 hrs after one meal every day Bring blood sugar records to the next class  Exercise: Walk for  10  minutes   3 days a week and gradually increase   Eat 3 meals day, 1-2 snacks a day Space meals 4-6 hours apart Limit fried foods  Return for classes on:

## 2020-03-24 ENCOUNTER — Other Ambulatory Visit: Payer: Self-pay | Admitting: Internal Medicine

## 2020-03-31 ENCOUNTER — Encounter: Payer: PPO | Attending: Internal Medicine | Admitting: Dietician

## 2020-03-31 ENCOUNTER — Other Ambulatory Visit: Payer: Self-pay

## 2020-03-31 ENCOUNTER — Encounter: Payer: Self-pay | Admitting: Dietician

## 2020-03-31 VITALS — Ht 65.0 in | Wt 178.7 lb

## 2020-03-31 DIAGNOSIS — E119 Type 2 diabetes mellitus without complications: Secondary | ICD-10-CM | POA: Insufficient documentation

## 2020-03-31 NOTE — Progress Notes (Signed)
Appt. Start Time: 900 Appt. End Time: 1200  Class 1 Diabetes Overview - define DM; state own type of DM; identify functions of pancreas and insulin; define insulin deficiency vs insulin resistance  Psychosocial - identify DM as a source of stress; state the effects of stress on BG control  Nutritional Management - describe effects of food on blood glucose; identify sources of carbohydrate, protein and fat; verbalize the importance of balance meals in controlling blood glucose  Exercise - describe the effects of exercise on blood glucose and importance of regular exercise in controlling diabetes; state a plan for personal exercise; verbalize contraindications for exercise  Self-Monitoring - state importance of SMBG; use SMBG results to effectively manage diabetes; identify importance of regular HbA1C testing and goals for results  Acute Complications - recognize hyperglycemia and hypoglycemia with causes and effects; identify blood glucose results as high, low or in control; list steps in treating and preventing high and low blood glucose  Chronic Complications/Foot, Skin, Eye Dental Care - identify possible long-term complications of diabetes (retinopathy, neuropathy, nephropathy, cardiovascular disease, infections); state importance of daily self-foot exams; describe how to examine feet and what to look for; explain appropriate eye and dental care  Lifestyle Changes/Goals & Health/Community Resources - state benefits of making appropriate lifestyle changes; identify habits that need to change (meals, tobacco, alcohol); identify strategies to reduce risk factors for personal health  Pregnancy/Sexual Health - define gestational diabetes; state importance of good blood glucose control and birth control prior to pregnancy; state importance of good blood glucose control in preventing sexual problems (impotence, vaginal dryness, infections, loss of desire); state relationship of blood glucose control  and pregnancy outcome; describe risk of maternal and fetal complications  Teaching Materials Used: Class 1 Slides/Notebook Diabetes Booklet ID Card  Medic Alert/Medic ID Forms Sleep Evaluation Exercise Handout Planning a Balanced Meal Goals for Class 1

## 2020-04-05 DIAGNOSIS — H401131 Primary open-angle glaucoma, bilateral, mild stage: Secondary | ICD-10-CM | POA: Diagnosis not present

## 2020-04-07 ENCOUNTER — Other Ambulatory Visit: Payer: Self-pay

## 2020-04-07 ENCOUNTER — Encounter: Payer: Self-pay | Admitting: *Deleted

## 2020-04-07 ENCOUNTER — Encounter: Payer: PPO | Admitting: *Deleted

## 2020-04-07 VITALS — Wt 176.0 lb

## 2020-04-07 DIAGNOSIS — E119 Type 2 diabetes mellitus without complications: Secondary | ICD-10-CM | POA: Diagnosis not present

## 2020-04-07 NOTE — Progress Notes (Signed)

## 2020-04-08 ENCOUNTER — Ambulatory Visit: Payer: PPO | Admitting: *Deleted

## 2020-04-14 ENCOUNTER — Other Ambulatory Visit: Payer: Self-pay

## 2020-04-14 ENCOUNTER — Encounter: Payer: Self-pay | Admitting: Dietician

## 2020-04-14 ENCOUNTER — Encounter: Payer: PPO | Admitting: Dietician

## 2020-04-14 VITALS — BP 122/80 | Ht 65.0 in | Wt 177.2 lb

## 2020-04-14 DIAGNOSIS — E119 Type 2 diabetes mellitus without complications: Secondary | ICD-10-CM | POA: Diagnosis not present

## 2020-04-14 NOTE — Progress Notes (Signed)
Appt. Start Time: 900 Appt. End Time: 1200  Class 3 Diabetes Overview - identify functions of pancreas and insulin; define insulin deficiency vs insulin resistance  Medications - state name, dose, timing of currently prescribed medications; describe types of medications available for diabetes  Psychosocial - identify DM as a source of stress; state the effects of stress on BG control; verbalize appropriate stress management techniques; identify personal stress issues   Nutritional Management - use food labels to identify serving size, content of carbohydrate, fiber, protein, fat, saturated fat and sodium; recognize food sources of fat, saturated fat, trans fat, and sodium, and verbalize goals for intake; describe healthful, appropriate food choices when dining out   Exercise - state a plan for personal exercise; verbalize contraindications for exercise  Self-Monitoring - state importance of SMBG; use SMBG results to effectively manage diabetes; identify importance of regular HbA1C testing and goals for results  Acute Complications - recognize hyperglycemia and hypoglycemia with causes and effects; identify blood glucose results as high, low or in control; list steps in treating and preventing high and low blood glucose  Chronic Complications - state importance of daily self-foot exams; explain appropriate eye and dental care  Lifestyle Changes/Goals & Health/Community Resources - set goals for proper diabetes care; state need for and frequency of healthcare follow-up; describe appropriate community resources for good health (ADA, web sites, apps)   Teaching Materials Used: Class 3 Slide Packet Diabetes Stress Test Stress Management Tools Stress Poem Goal Setting Worksheet Website/App List

## 2020-04-19 ENCOUNTER — Encounter: Payer: Self-pay | Admitting: *Deleted

## 2020-04-19 DIAGNOSIS — H401123 Primary open-angle glaucoma, left eye, severe stage: Secondary | ICD-10-CM | POA: Diagnosis not present

## 2020-04-19 LAB — HM DIABETES EYE EXAM

## 2020-04-25 ENCOUNTER — Ambulatory Visit (INDEPENDENT_AMBULATORY_CARE_PROVIDER_SITE_OTHER): Payer: PPO

## 2020-04-25 VITALS — Ht 65.0 in | Wt 177.0 lb

## 2020-04-25 DIAGNOSIS — Z0000 Encounter for general adult medical examination without abnormal findings: Secondary | ICD-10-CM | POA: Diagnosis not present

## 2020-04-25 NOTE — Progress Notes (Addendum)
Subjective:   Victoria Harris is a 69 y.o. female who presents for Medicare Annual (Subsequent) preventive examination.  Review of Systems    No ROS.  Medicare Wellness Virtual Visit.    Cardiac Risk Factors include: advanced age (>81men, >40 women);diabetes mellitus     Objective:    Today's Vitals   04/25/20 0935  Weight: 177 lb (80.3 kg)  Height: 5\' 5"  (1.651 m)   Body mass index is 29.45 kg/m.  Advanced Directives 04/25/2020 03/15/2020 12/02/2019 04/21/2019 04/16/2018 04/02/2017 10/09/2016  Does Patient Have a Medical Advance Directive? Yes No Yes Yes Yes Yes Yes  Type of Paramedic of [place];Living will - [place];Living will [place];Living will [place];Living will Living will;Healthcare Power of Attorney -  Does patient want to make changes to medical advance directive? No - Patient declined - - No - Patient declined No - Patient declined No - Patient declined -  Copy of [place] in Chart? No - copy requested - No - copy requested No - copy requested No - copy requested No - copy requested -  Would patient like information on creating a medical advance directive? - No - Patient declined - - - - -    Current Medications (verified) Outpatient Encounter Medications as of 04/25/2020  Medication Sig  . aspirin 81 MG tablet Take 81 mg by mouth daily.  . Cholecalciferol (VITAMIN D-1000 MAX ST) 1000 units tablet Take 1,000 Units by mouth daily.   . colestipol (COLESTID) 1 g tablet Take 1 g by mouth daily.   . dorzolamide-timolol (COSOPT) 22.3-6.8 MG/ML ophthalmic solution Place 1 drop into both eyes 2 (two) times daily.  Marland Kitchen glucose blood (ONETOUCH VERIO) test strip USE AS INSTRUCTED TO CHECK BLOOD SUGARS DAILY. DX E11.9  . Multiple Vitamins-Minerals (MULTIVITAMIN ADULT PO) Take by mouth daily.  . Omega-3 Fatty Acids (OMEGA-3 FISH OIL) 1200 MG CAPS Take 1,200 mg by mouth  daily.   . Probiotic Product (PROBIOTIC DAILY) CAPS Take 1 capsule by mouth daily.  . rosuvastatin (CRESTOR) 5 MG tablet TAKE 1 TABLET BY MOUTH EVERY DAY  . TRAVATAN Z 0.004 % SOLN ophthalmic solution Place 1 drop into both eyes at bedtime.   No facility-administered encounter medications on file as of 04/25/2020.    Allergies (verified) No known drug allergy   History: Past Medical History:  Diagnosis Date  . Colon polyps    hyperplastic  . Diabetes mellitus without complication ([place])   . Diverticulosis   . Hx of adenomatous colonic polyps    tubular adenoma  . Hypercholesterolemia   . Serrated adenoma of colon   . Stroke (cerebrum) ([place])   . Thrombocytopenia ([place])   . Uterus descensus    Past Surgical History:  Procedure Laterality Date  . ABDOMINAL HYSTERECTOMY  2009  . CHOLECYSTECTOMY  2007  . COLONOSCOPY N/A 04/05/2015   Procedure: COLONOSCOPY;  Surgeon: Lollie Sails, MD;  Location[facility] ENDOSCOPY;  Service: Endoscopy;  Laterality: N/A;  . COLONOSCOPY WITH PROPOFOL N/A 10/09/2016   Procedure: COLONOSCOPY WITH PROPOFOL;  Surgeon: Lollie Sails, MD;  Location: [facility] ENDOSCOPY;  Service: Endoscopy;  Laterality: N/A;  . COLONOSCOPY WITH PROPOFOL N/A 12/02/2019   Procedure: COLONOSCOPY WITH PROPOFOL;  Surgeon: Toledo, Benay Pike, MD;  Location: [facility] ENDOSCOPY;  Service: Gastroenterology;  Laterality: N/A;  . DILATION AND CURETTAGE OF UTERUS  2001 and 2006  . FOOT SURGERY  2007  . TONSILLECTOMY  [place]    . vocal cord cyst removed     Family History  Problem Relation Age of Onset  . Diabetes Other   . Colon cancer Mother   . Diabetes Mother   . Kidney disease Mother   . Heart disease Mother   . Stroke Paternal Grandmother   . Stroke Paternal Grandfather   . Prostate cancer Father   . Diabetes Father   . Glaucoma Father   . Breast cancer Paternal Aunt        x2  . Uterine cancer Maternal Aunt   . Diabetes Brother   . Diabetes Brother     Social History   Socioeconomic History  . Marital status: Married    Spouse name: Not on file  . Number of children: Not on file  . Years of education: Not on file  . Highest education level: Not on file  Occupational History  . Not on file  Tobacco Use  . Smoking status: Never Smoker  . Smokeless tobacco: Never Used  Vaping Use  . Vaping Use: Never used  Substance and Sexual Activity  . Alcohol use: No    Alcohol/week: 0.0 standard drinks  . Drug use: No  . Sexual activity: Never  Other Topics Concern  . Not on file  Social History Narrative  . Not on file   Social Determinants of Health   Financial Resource Strain: Low Risk   . Difficulty of Paying Living Expenses: Not hard at all  Food Insecurity: No Food Insecurity  . Worried About Charity fundraiser in the Last Year: Never true  . Ran Out of Food in the Last Year: Never true  Transportation Needs: No Transportation Needs  . Lack of Transportation (Medical): No  . Lack of Transportation (Non-Medical): No  Physical Activity: Insufficiently Active  . Days of Exercise per Week: 3 days  . Minutes of Exercise per Session: 20 min  Stress: No Stress Concern Present  . Feeling of Stress : Not at all  Social Connections: Unknown  . Frequency of Communication with Friends and Family: Not on file  . Frequency of Social Gatherings with Friends and Family: Not on file  . Attends Religious Services: Not on file  . Active Member of Clubs or Organizations: Not on file  . Attends Archivist Meetings: Not on file  . Marital Status: Married    Tobacco Counseling Counseling given: Not Answered   Clinical Intake:  Pre-visit preparation completed: Yes        Diabetes: Yes (Followed by PCP)  How often do you need to have someone help you when you read instructions, pamphlets, or other written materials from your doctor or pharmacy?: 1 - Never  Nutrition Risk Assessment: Has the patient had any N/V/D  within the last 2 months?  No  Does the patient have any non-healing wounds?  No  Has the patient had any unintentional weight loss or weight gain?  No   Diabetes: If diabetic, was a CBG obtained today?  No  Did the patient bring in their glucometer from home?  No . Virtual visit.  How often do you monitor your CBG's? 1-2x daily.   Financial Strains and Diabetes Management: Are you having any financial strains with the device, your supplies or your medication? No .  Does the patient want to be seen by Chronic Care Management for management of their diabetes?  No  Would the patient like to be referred  to a Nutritionist or for Diabetic Management?  No . Completed course.   Diabetic Exams: Diabetic Eye Exam: Completed 04/08/20 Diabetic Foot Exam: Completed 02/15/20  Interpreter Needed?: No      Activities of Daily Living In your present state of health, do you have any difficulty performing the following activities: 04/25/2020  Hearing? N  Vision? N  Difficulty concentrating or making decisions? N  Walking or climbing stairs? N  Dressing or bathing? N  Doing errands, shopping? N  Preparing Food and eating ? N  Using the Toilet? N  In the past six months, have you accidently leaked urine? N  Do you have problems with loss of bowel control? N  Managing your Medications? N  Managing your Finances? N  Housekeeping or managing your Housekeeping? N  Some recent data might be hidden    Patient Care Team: Einar Pheasant, MD as PCP - General (Internal Medicine)  Indicate any recent Medical Services you may have received from other than [facility] providers in the past year (date may be approximate).     Assessment:   This is a routine wellness examination for [place].  I connected with Girtha today by telephone and verified that I am speaking with the correct person using two identifiers. Location patient: home Location provider: work Persons participating in the virtual  visit: patient, Marine scientist.    I discussed the limitations, risks, security and privacy concerns of performing an evaluation and management service by telephone and the availability of in person appointments. The patient expressed understanding and verbally consented to this telephonic visit.    Interactive audio and video telecommunications were attempted between this provider and patient, however failed, due to patient having technical difficulties OR patient did not have access to video capability.  We continued and completed visit with audio only.  Some vital signs may be absent or patient reported.   Hearing/Vision screen  Hearing Screening   125Hz  250Hz  500Hz  1000Hz  2000Hz  3000Hz  4000Hz  6000Hz  8000Hz   Right ear:           Left ear:           Comments: Patient is able to hear conversational tones without difficulty.  No issues reported.  Vision Screening Comments: [facility]  Wears corrective lenses  OV every 6 months; glaucoma; drops in use  No retinopathy reported  Dietary issues and exercise activities discussed: Current Exercise Habits: Home exercise routine, Type of exercise: walking, Time (Minutes): 20, Frequency (Times/Week): 3, Weekly Exercise (Minutes/Week): 60, Intensity: Mild  Low carb diet Good water intake  Goals    . Increase physical activity     Walk for exercise      Depression Screen PHQ 2/9 Scores 04/25/2020 03/15/2020 04/21/2019 10/07/2018 04/16/2018 04/02/2017 03/23/2016  PHQ - 2 Score 0 0 0 0 0 0 0  PHQ- 9 Score - - - - - 0 -    Fall Risk Fall Risk  04/25/2020 04/14/2020 04/07/2020 03/31/2020 03/15/2020  Falls in the past year? 1 1 1 1 1   Comment no falls since last week's visit 11/11 no falls since last week's visit 11/11 no falls since last week's visit 09/2019; no new falls since last visit on 03/15/20 -  Number falls in past yr: - - 0 - 0  Injury with Fall? - - - - 0  Follow up Falls evaluation completed - - - -   Handrails in use  when climbing stairs? Yes Home free of loose throw rugs in  walkways, pet beds, electrical cords, etc? Yes  Adequate lighting in your home to reduce risk of falls? Yes   ASSISTIVE DEVICES UTILIZED TO PREVENT FALLS: Life alert? No  Use of a cane, walker or w/c? No  Grab bars in the bathroom? No  Shower chair or bench in shower? No  Elevated toilet seat or a handicapped toilet? No   TIMED UP AND GO: Was the test performed? No . Virtual visit.   Cognitive Function: Patient is alert and oriented x3.  Denies difficulty focusing, making decisions, memory loss.  Enjoys sodoku for brain health.  MMSE/6CIT deferred. Normal by direct communication/observation.  MMSE - Mini Mental State Exam 04/16/2018 04/02/2017  Orientation to time 5 5  Orientation to Place 5 5  Registration 3 3  Attention/ Calculation 5 5  Recall 3 3  Language- name 2 objects 2 2  Language- repeat 1 1  Language- follow 3 step command 3 3  Language- read & follow direction 1 1  Write a sentence 1 1  Copy design 1 1  Total score 30 30     6CIT Screen 04/21/2019  What Year? 0 points  What month? 0 points  What time? 0 points  Count back from 20 0 points  Months in reverse 0 points  Repeat phrase 0 points  Total Score 0   Immunizations Immunization History  Administered Date(s) Administered  . Fluad Quad(high Dose 65+) 04/21/2019  . Influenza, High Dose Seasonal PF 03/23/2016, 04/04/2017, 04/18/2018  . Influenza,inj,Quad PF,6+ Mos 05/25/2015  . Pneumococcal Conjugate-13 07/19/2015  . Pneumococcal Polysaccharide-23 11/29/2016  . Zoster 09/17/2013   Health Maintenance Health Maintenance  Topic Date Due  . OPHTHALMOLOGY EXAM  04/08/2020  . FOOT EXAM  04/20/2020  . INFLUENZA VACCINE  08/25/2020 (Originally 12/27/2019)  . COVID-19 Vaccine (1) 01/06/2021 (Originally 11/13/1962)  . TETANUS/TDAP  04/25/2021 (Originally 11/13/1969)  . HEMOGLOBIN A1C  06/15/2020  . MAMMOGRAM  12/02/2020  . URINE MICROALBUMIN   12/13/2020  . COLONOSCOPY  12/01/2029  . DEXA SCAN  Completed  . Hepatitis C Screening  Completed  . PNA vac Low Risk Adult  Completed   Colorectal cancer screening: Completed 12/02/19. Repeat every 10 years  Mammogram status: Completed 12/03/19. Repeat every year. 3D Bilateral.    Bone density completed 12/03/19. Taking Vitamin D 1000 UT.   Tdap- Advised this vaccine may be received at local pharmacy ot [facility]. Update immunization record as completed.   Lung Cancer Screening: (Low Dose CT Chest recommended if Age 35-80 years, 30 pack-year currently smoking OR have quit w/in 15years.) does not qualify.   Hepatitis C Screening: Completed 04/02/2017.  Vision Screening: Recommended annual ophthalmology exams for early detection of glaucoma and other disorders of the eye. Is the patient up to date with their annual eye exam?  Yes , visits every 6 months.  Who is the provider or what is the name of the office in which the patient attends annual eye exams? [facility], Dr. Wallace Going  Dental Screening: Recommended annual dental exams for proper oral hygiene. Visits every 6 months.   Community Resource Referral / Chronic Care Management: CRR required this visit?  No   CCM required this visit?  No      Plan:   Keep all routine maintenance appointments.   Next scheduled lab 04/26/20 @ 8:30  Follow up 04/29/20 @ 10:00  I have personally reviewed and noted the following in the patient's chart:   . Medical and social history .  Use of alcohol, tobacco or illicit drugs  . Current medications and supplements . Functional ability and status . Nutritional status . Physical activity . Advanced directives . List of other physicians . Hospitalizations, surgeries, and ER visits in previous 12 months . Vitals . Screenings to include cognitive, depression, and falls . Referrals and appointments  In addition, I have reviewed and discussed with patient certain preventive  protocols, quality metrics, and best practice recommendations. A written personalized care plan for preventive services as well as general preventive health recommendations were provided to patient via mychart.     Varney Biles, LPN   07/26/3141    Reviewed above information.  Agree with assessment and plan.   Dr Nicki Reaper

## 2020-04-25 NOTE — Patient Instructions (Addendum)
Victoria Harris , Thank you for taking time to come for your Medicare Wellness Visit. I appreciate your ongoing commitment to your health goals. Please review the following plan we discussed and let me know if I can assist you in the future.   These are the goals we discussed: Goals    . Increase physical activity     Walk for exercise       This is a list of the screening recommended for you and due dates:  Health Maintenance  Topic Date Due  . Eye exam for diabetics  04/08/2020  . Complete foot exam   04/20/2020  . Flu Shot  08/25/2020*  . COVID-19 Vaccine (1) 01/06/2021*  . Tetanus Vaccine  04/25/2021*  . Hemoglobin A1C  06/15/2020  . Mammogram  12/02/2020  . Urine Protein Check  12/13/2020  . Colon Cancer Screening  12/01/2029  . DEXA scan (bone density measurement)  Completed  .  Hepatitis C: One time screening is recommended by Center for Disease Control  (CDC) for  adults born from [birth year] through 1966.   Completed  . Pneumonia vaccines  Completed  *Topic was postponed. The date shown is not the original due date.    Immunizations Immunization History  Administered Date(s) Administered  . Fluad Quad(high Dose 65+) 04/21/2019  . Influenza, High Dose Seasonal PF 03/23/2016, 04/04/2017, 04/18/2018  . Influenza,inj,Quad PF,6+ Mos 05/25/2015  . Pneumococcal Conjugate-13 07/19/2015  . Pneumococcal Polysaccharide-23 11/29/2016  . Zoster 09/17/2013   Keep all routine maintenance appointments.   Next scheduled lab 04/26/20 @ 8:30  Follow up 04/29/20 @ 10:00  Advanced directives: End of life planning; Advance aging; Advanced directives discussed.  Copy of current HCPOA/Living Will requested.    Conditions/risks identified: none new.   Follow up in one year for your annual wellness visit.   Preventive Care 37 Years and Older, Female Preventive care refers to lifestyle choices and visits with your health care provider that can promote health and wellness. What does  preventive care include?  A yearly physical exam. This is also called an annual well check.  Dental exams once or twice a year.  Routine eye exams. Ask your health care provider how often you should have your eyes checked.  Personal lifestyle choices, including:  Daily care of your teeth and gums.  Regular physical activity.  Eating a healthy diet.  Avoiding tobacco and drug use.  Limiting alcohol use.  Practicing safe sex.  Taking low-dose aspirin every day.  Taking vitamin and mineral supplements as recommended by your health care provider. What happens during an annual well check? The services and screenings done by your health care provider during your annual well check will depend on your age, overall health, lifestyle risk factors, and family history of disease. Counseling  Your health care provider may ask you questions about your:  Alcohol use.  Tobacco use.  Drug use.  Emotional well-being.  Home and relationship well-being.  Sexual activity.  Eating habits.  History of falls.  Memory and ability to understand (cognition).  Work and work Statistician.  Reproductive health. Screening  You may have the following tests or measurements:  Height, weight, and BMI.  Blood pressure.  Lipid and cholesterol levels. These may be checked every 5 years, or more frequently if you are over 40 years old.  Skin check.  Lung cancer screening. You may have this screening every year starting at age 20 if you have a 30-pack-year history of smoking and currently smoke  or have quit within the past 15 years.  Fecal occult blood test (FOBT) of the stool. You may have this test every year starting at age 37.  Flexible sigmoidoscopy or colonoscopy. You may have a sigmoidoscopy every 5 years or a colonoscopy every 10 years starting at age 31.  Hepatitis C blood test.  Hepatitis B blood test.  Sexually transmitted disease (STD) testing.  Diabetes screening. This  is done by checking your blood sugar (glucose) after you have not eaten for a while (fasting). You may have this done every 1-3 years.  Bone density scan. This is done to screen for osteoporosis. You may have this done starting at age 82.  Mammogram. This may be done every 1-2 years. Talk to your health care provider about how often you should have regular mammograms. Talk with your health care provider about your test results, treatment options, and if necessary, the need for more tests. Vaccines  Your health care provider may recommend certain vaccines, such as:  Influenza vaccine. This is recommended every year.  Tetanus, diphtheria, and acellular pertussis (Tdap, Td) vaccine. You may need a Td booster every 10 years.  Zoster vaccine. You may need this after age 73.  Pneumococcal 13-valent conjugate (PCV13) vaccine. One dose is recommended after age 75.  Pneumococcal polysaccharide (PPSV23) vaccine. One dose is recommended after age 9. Talk to your health care provider about which screenings and vaccines you need and how often you need them. This information is not intended to replace advice given to you by your health care provider. Make sure you discuss any questions you have with your health care provider. Document Released: 06/10/2015 Document Revised: 02/01/2016 Document Reviewed: 03/15/2015 Elsevier Interactive Patient Education  2017 [place] Prevention in the Home Falls can cause injuries. They can happen to people of all ages. There are many things you can do to make your home safe and to help prevent falls. What can I do on the outside of my home?  Regularly fix the edges of walkways and driveways and fix any cracks.  Remove anything that might make you trip as you walk through a door, such as a raised step or threshold.  Trim any bushes or trees on the path to your home.  Use bright outdoor lighting.  Clear any walking paths of anything that might make  someone trip, such as rocks or tools.  Regularly check to see if handrails are loose or broken. Make sure that both sides of any steps have handrails.  Any raised decks and porches should have guardrails on the edges.  Have any leaves, snow, or ice cleared regularly.  Use sand or salt on walking paths during winter.  Clean up any spills in your garage right away. This includes oil or grease spills. What can I do in the bathroom?  Use night lights.  Install grab bars by the toilet and in the tub and shower. Do not use towel bars as grab bars.  Use non-skid mats or decals in the tub or shower.  If you need to sit down in the shower, use a plastic, non-slip stool.  Keep the floor dry. Clean up any water that spills on the floor as soon as it happens.  Remove soap buildup in the tub or shower regularly.  Attach bath mats securely with double-sided non-slip rug tape.  Do not have throw rugs and other things on the floor that can make you trip. What can I do in the  bedroom?  Use night lights.  Make sure that you have a light by your bed that is easy to reach.  Do not use any sheets or blankets that are too big for your bed. They should not hang down onto the floor.  Have a firm chair that has side arms. You can use this for support while you get dressed.  Do not have throw rugs and other things on the floor that can make you trip. What can I do in the kitchen?  Clean up any spills right away.  Avoid walking on wet floors.  Keep items that you use a lot in easy-to-reach places.  If you need to reach something above you, use a strong step stool that has a grab bar.  Keep electrical cords out of the way.  Do not use floor polish or wax that makes floors slippery. If you must use wax, use non-skid floor wax.  Do not have throw rugs and other things on the floor that can make you trip. What can I do with my stairs?  Do not leave any items on the stairs.  Make sure that  there are handrails on both sides of the stairs and use them. Fix handrails that are broken or loose. Make sure that handrails are as long as the stairways.  Check any carpeting to make sure that it is firmly attached to the stairs. Fix any carpet that is loose or worn.  Avoid having throw rugs at the top or bottom of the stairs. If you do have throw rugs, attach them to the floor with carpet tape.  Make sure that you have a light switch at the top of the stairs and the bottom of the stairs. If you do not have them, ask someone to add them for you. What else can I do to help prevent falls?  Wear shoes that:  Do not have high heels.  Have rubber bottoms.  Are comfortable and fit you well.  Are closed at the toe. Do not wear sandals.  If you use a stepladder:  Make sure that it is fully opened. Do not climb a closed stepladder.  Make sure that both sides of the stepladder are locked into place.  Ask someone to hold it for you, if possible.  Clearly mark and make sure that you can see:  Any grab bars or handrails.  First and last steps.  Where the edge of each step is.  Use tools that help you move around (mobility aids) if they are needed. These include:  Canes.  Walkers.  Scooters.  Crutches.  Turn on the lights when you go into a dark area. Replace any light bulbs as soon as they burn out.  Set up your furniture so you have a clear path. Avoid moving your furniture around.  If any of your floors are uneven, fix them.  If there are any pets around you, be aware of where they are.  Review your medicines with your doctor. Some medicines can make you feel dizzy. This can increase your chance of falling. Ask your doctor what other things that you can do to help prevent falls. This information is not intended to replace advice given to you by your health care provider. Make sure you discuss any questions you have with your health care provider. Document Released:  03/10/2009 Document Revised: 10/20/2015 Document Reviewed: 06/18/2014 Elsevier Interactive Patient Education  2017 [employer].

## 2020-04-26 ENCOUNTER — Other Ambulatory Visit (INDEPENDENT_AMBULATORY_CARE_PROVIDER_SITE_OTHER): Payer: PPO

## 2020-04-26 ENCOUNTER — Other Ambulatory Visit: Payer: Self-pay

## 2020-04-26 DIAGNOSIS — E1159 Type 2 diabetes mellitus with other circulatory complications: Secondary | ICD-10-CM

## 2020-04-26 DIAGNOSIS — E7800 Pure hypercholesterolemia, unspecified: Secondary | ICD-10-CM

## 2020-04-26 LAB — HEPATIC FUNCTION PANEL
ALT: 21 U/L (ref 0–35)
AST: 22 U/L (ref 0–37)
Albumin: 4.4 g/dL (ref 3.5–5.2)
Alkaline Phosphatase: 56 U/L (ref 39–117)
Bilirubin, Direct: 0.1 mg/dL (ref 0.0–0.3)
Total Bilirubin: 0.7 mg/dL (ref 0.2–1.2)
Total Protein: 6.7 g/dL (ref 6.0–8.3)

## 2020-04-26 LAB — LIPID PANEL
Cholesterol: 158 mg/dL (ref 0–200)
HDL: 57.4 mg/dL (ref >39.00)
LDL Cholesterol: 83 mg/dL (ref 0–99)
NonHDL: 100.31
Total CHOL/HDL Ratio: 3
Triglycerides: 86 mg/dL (ref 0.0–149.0)
VLDL: 17.2 mg/dL (ref 0.0–40.0)

## 2020-04-26 LAB — BASIC METABOLIC PANEL
BUN: 24 mg/dL — ABNORMAL HIGH (ref 6–23)
CO2: 27 mEq/L (ref 19–32)
Calcium: 9.3 mg/dL (ref 8.4–10.5)
Chloride: 108 mEq/L (ref 96–112)
Creatinine, Ser: 0.71 mg/dL (ref 0.40–1.20)
GFR: 86.13 mL/min (ref >60.00)
Glucose, Bld: 154 mg/dL — ABNORMAL HIGH (ref 70–99)
Potassium: 4.2 mEq/L (ref 3.5–5.1)
Sodium: 141 mEq/L (ref 135–145)

## 2020-04-26 LAB — HEMOGLOBIN A1C: Hgb A1c MFr Bld: 6.6 % — ABNORMAL HIGH (ref 4.6–6.5)

## 2020-04-29 ENCOUNTER — Other Ambulatory Visit: Payer: Self-pay

## 2020-04-29 ENCOUNTER — Encounter: Payer: Self-pay | Admitting: Internal Medicine

## 2020-04-29 ENCOUNTER — Ambulatory Visit (INDEPENDENT_AMBULATORY_CARE_PROVIDER_SITE_OTHER): Payer: PPO | Admitting: Internal Medicine

## 2020-04-29 VITALS — BP 122/70 | HR 60 | Temp 98.0°F | Resp 16 | Ht 65.0 in | Wt 176.6 lb

## 2020-04-29 DIAGNOSIS — Z23 Encounter for immunization: Secondary | ICD-10-CM

## 2020-04-29 DIAGNOSIS — R195 Other fecal abnormalities: Secondary | ICD-10-CM | POA: Diagnosis not present

## 2020-04-29 DIAGNOSIS — Z86010 Personal history of colonic polyps: Secondary | ICD-10-CM

## 2020-04-29 DIAGNOSIS — E1159 Type 2 diabetes mellitus with other circulatory complications: Secondary | ICD-10-CM | POA: Diagnosis not present

## 2020-04-29 DIAGNOSIS — E7800 Pure hypercholesterolemia, unspecified: Secondary | ICD-10-CM | POA: Diagnosis not present

## 2020-04-29 DIAGNOSIS — Z8673 Personal history of transient ischemic attack (TIA), and cerebral infarction without residual deficits: Secondary | ICD-10-CM

## 2020-04-29 LAB — HM DIABETES FOOT EXAM

## 2020-04-29 NOTE — Progress Notes (Signed)
Patient ID: Darl Pikes, female   DOB: 1950/06/20, 69 y.o.   MRN: [id number]   Subjective:    Patient ID: Darl Pikes, female    DOB: [birth year], 69 y.o.   MRN: [id number]  HPI This visit occurred during the SARS-CoV-2 public health emergency.  Safety protocols were in place, including screening questions prior to the visit, additional usage of staff PPE, and extensive cleaning of exam room while observing appropriate contact time as indicated for disinfecting solutions.  Patient here for a scheduled follow up.  Here to follow up regarding her blood sugar, blood pressure and cholesterol.  She is doing well.  Feels good.  Stays active.  No chest pain or sob with increased activity or exertion.  No acid reflux or abdominal pain.  Bowels moving and relatively stable.  Takes prn imodium.  On colestid.  Up to date with eye exams.  Had eye exam last week. Discussed labs.     Past Medical History:  Diagnosis Date  . Colon polyps    hyperplastic  . Diabetes mellitus without complication ([place])   . Diverticulosis   . Hx of adenomatous colonic polyps    tubular adenoma  . Hypercholesterolemia   . Serrated adenoma of colon   . Stroke (cerebrum) ([place])   . Thrombocytopenia ([place])   . Uterus descensus    Past Surgical History:  Procedure Laterality Date  . ABDOMINAL HYSTERECTOMY  2009  . CHOLECYSTECTOMY  2007  . COLONOSCOPY N/A 04/05/2015   Procedure: COLONOSCOPY;  Surgeon: Lollie Sails, MD;  Location: [facility] ENDOSCOPY;  Service: Endoscopy;  Laterality: N/A;  . COLONOSCOPY WITH PROPOFOL N/A 10/09/2016   Procedure: COLONOSCOPY WITH PROPOFOL;  Surgeon: Lollie Sails, MD;  Location: [facility] ENDOSCOPY;  Service: Endoscopy;  Laterality: N/A;  . COLONOSCOPY WITH PROPOFOL N/A 12/02/2019   Procedure: COLONOSCOPY WITH PROPOFOL;  Surgeon: Toledo, Benay Pike, MD;  Location: [facility] ENDOSCOPY;  Service: Gastroenterology;  Laterality: N/A;  . DILATION AND CURETTAGE OF UTERUS  2001 and 2006  .  FOOT SURGERY  2007  . TONSILLECTOMY  1960  . TUBAL LIGATION    . vocal cord cyst removed     Family History  Problem Relation Age of Onset  . Diabetes Other   . Colon cancer Mother   . Diabetes Mother   . Kidney disease Mother   . Heart disease Mother   . Stroke Paternal Grandmother   . Stroke Paternal Grandfather   . Prostate cancer Father   . Diabetes Father   . Glaucoma Father   . Breast cancer Paternal Aunt        x2  . Uterine cancer Maternal Aunt   . Diabetes Brother   . Diabetes Brother    Social History   Socioeconomic History  . Marital status: Married    Spouse name: Not on file  . Number of children: Not on file  . Years of education: Not on file  . Highest education level: Not on file  Occupational History  . Not on file  Tobacco Use  . Smoking status: Never Smoker  . Smokeless tobacco: Never Used  Vaping Use  . Vaping Use: Never used  Substance and Sexual Activity  . Alcohol use: No    Alcohol/week: 0.0 standard drinks  . Drug use: No  . Sexual activity: Never  Other Topics Concern  . Not on file  Social History Narrative  . Not on file   Social Determinants of Health   Financial Resource  Strain: Low Risk   . Difficulty of Paying Living Expenses: Not hard at all  Food Insecurity: No Food Insecurity  . Worried About Charity fundraiser in the Last Year: Never true  . Ran Out of Food in the Last Year: Never true  Transportation Needs: No Transportation Needs  . Lack of Transportation (Medical): No  . Lack of Transportation (Non-Medical): No  Physical Activity: Insufficiently Active  . Days of Exercise per Week: 3 days  . Minutes of Exercise per Session: 20 min  Stress: No Stress Concern Present  . Feeling of Stress : Not at all  Social Connections: Unknown  . Frequency of Communication with Friends and Family: Not on file  . Frequency of Social Gatherings with Friends and Family: Not on file  . Attends Religious Services: Not on file  .  Active Member of Clubs or Organizations: Not on file  . Attends Archivist Meetings: Not on file  . Marital Status: Married    Outpatient Encounter Medications as of 04/29/2020  Medication Sig  . aspirin 81 MG tablet Take 81 mg by mouth daily.  . Cholecalciferol (VITAMIN D-1000 MAX ST) 1000 units tablet Take 1,000 Units by mouth daily.   . colestipol (COLESTID) 1 g tablet Take 1 g by mouth daily.   . dorzolamide-timolol (COSOPT) 22.3-6.8 MG/ML ophthalmic solution Place 1 drop into both eyes 2 (two) times daily.  Marland Kitchen glucose blood (ONETOUCH VERIO) test strip USE AS INSTRUCTED TO CHECK BLOOD SUGARS DAILY. DX E11.9  . Multiple Vitamins-Minerals (MULTIVITAMIN ADULT PO) Take by mouth daily.  . Omega-3 Fatty Acids (OMEGA-3 FISH OIL) 1200 MG CAPS Take 1,200 mg by mouth daily.   . Probiotic Product (PROBIOTIC DAILY) CAPS Take 1 capsule by mouth daily.  . rosuvastatin (CRESTOR) 5 MG tablet TAKE 1 TABLET BY MOUTH EVERY DAY  . TRAVATAN Z 0.004 % SOLN ophthalmic solution Place 1 drop into both eyes at bedtime.   No facility-administered encounter medications on file as of 04/29/2020.    Review of Systems  Constitutional: Negative for appetite change and unexpected weight change.  HENT: Negative for congestion and sinus pressure.   Respiratory: Negative for cough, chest tightness and shortness of breath.   Cardiovascular: Negative for chest pain, palpitations and leg swelling.  Gastrointestinal: Negative for abdominal pain, diarrhea, nausea and vomiting.  Genitourinary: Negative for difficulty urinating and dysuria.  Musculoskeletal: Negative for joint swelling and myalgias.  Skin: Negative for color change and rash.  Neurological: Negative for dizziness, light-headedness and headaches.  Psychiatric/Behavioral: Negative for agitation and dysphoric mood.       Objective:    Physical Exam Vitals reviewed.  Constitutional:      General: She is not in acute distress.    Appearance:  Normal appearance.  HENT:     Head: Normocephalic and atraumatic.     Right Ear: External ear normal.     Left Ear: External ear normal.  Eyes:     General: No scleral icterus.       Right eye: No discharge.        Left eye: No discharge.     Conjunctiva/sclera: Conjunctivae normal.  Neck:     Thyroid: No thyromegaly.  Cardiovascular:     Rate and Rhythm: Normal rate and regular rhythm.  Pulmonary:     Effort: No respiratory distress.     Breath sounds: Normal breath sounds. No wheezing.  Abdominal:     General: Bowel sounds are normal.  Palpations: Abdomen is soft.     Tenderness: There is no abdominal tenderness.  Musculoskeletal:        General: No swelling or tenderness.     Cervical back: Neck supple. No tenderness.  Lymphadenopathy:     Cervical: No cervical adenopathy.  Skin:    Findings: No erythema or rash.  Neurological:     Mental Status: She is alert.  Psychiatric:        Mood and Affect: Mood normal.        Behavior: Behavior normal.     BP 122/70   Pulse 60   Temp 98 F (36.7 C) (Oral)   Resp 16   Ht _0  (1.651 m)   Wt 176 lb 9.6 oz (80.1 kg)   SpO2 98%   BMI 29.39 kg/m  Wt Readings from Last 3 Encounters:  04/29/20 176 lb 9.6 oz (80.1 kg)  04/25/20 177 lb (80.3 kg)  04/14/20 177 lb 3.2 oz (80.4 kg)     Lab Results  Component Value Date   WBC 5.1 12/14/2019   HGB 14.6 12/14/2019   HCT 42.0 12/14/2019   PLT 152.0 12/14/2019   GLUCOSE 154 (H) 04/26/2020   CHOL 158 04/26/2020   TRIG 86.0 04/26/2020   HDL 57.40 04/26/2020   LDLCALC 83 04/26/2020   ALT 21 04/26/2020   AST 22 04/26/2020   NA 141 04/26/2020   K 4.2 04/26/2020   CL 108 04/26/2020   CREATININE 0.71 04/26/2020   BUN 24 (H) 04/26/2020   CO2 27 04/26/2020   TSH 2.07 08/18/2019   HGBA1C 6.6 (H) 04/26/2020   MICROALBUR <0.7 12/14/2019    DG Bone Density  Result Date: 12/03/2019 EXAM: DUAL X-RAY ABSORPTIOMETRY (DXA) FOR BONE MINERAL DENSITY IMPRESSION: Your patient  Theo Reither completed a BMD test on 12/03/2019 using the [place] (software version: 14.10) manufactured by [employer]. The following summarizes the results of our evaluation. Technologist:vlm PATIENT BIOGRAPHICAL: Name: Anel, Creighton Patient ID: [id number] Birth Date: 04/06/1951 Height: 64.5 in. Gender: Female Exam Date: 12/03/2019 Weight: 176.0 lbs. Indications: Caucasian, Diabetic, Postmenopausal Fractures: Foot Treatments: Calcium, Vitamin D DENSITOMETRY RESULTS: Site         Region     Measured Date Measured Age WHO Classification Young Adult T-score BMD         %Change vs. Previous Significant Change (*) DualFemur Neck Right 12/03/2019 69.0 Normal -0.8 0.923 g/cm2 - - Left Forearm Radius 33% 12/03/2019 70.0 Normal -0.2 0.862 g/cm2 - - ASSESSMENT: The BMD measured at Femur Neck Right is 0.923 g/cm2 with a T-score of -0.8. This patient is considered NORMAL according to [facility]) criteria. Lumbar spine was not utilized due to advanced degenerative changes. The scan quality is good. World Pharmacologist [facility]) criteria for post-menopausal, Caucasian Women: Normal:                   T-score at or above -1 SD Osteopenia/low bone mass: T-score between -1 and -2.5 SD Osteoporosis:             T-score at or below -2.5 SD RECOMMENDATIONS: 1. All patients should optimize calcium and vitamin D intake. 2. Consider FDA-approved medical therapies in postmenopausal women and men aged 97 years and older, based on the following: a. A hip or vertebral(clinical or morphometric) fracture b. T-score < -2.5 at the femoral neck or spine after appropriate evaluation to exclude secondary causes c. Low bone mass (T-score between -1.0  and -2.5 at the femoral neck or spine) and a 10-year probability of a hip fracture > 3% or a 10-year probability of a major osteoporosis-related fracture > 20% based on the [place]-adapted WHO algorithm 3. Clinician judgment and/or patient  preferences may indicate treatment for people with 10-year fracture probabilities above or below these levels FOLLOW-UP: People with diagnosed cases of osteoporosis or at high risk for fracture should have regular bone mineral density tests. For patients eligible for Medicare, routine testing is allowed once every 2 years. The testing frequency can be increased to one year for patients who have rapidly progressing disease, those who are receiving or discontinuing medical therapy to restore bone mass, or have additional risk factors. I have reviewed this report, and agree with the above findings. Mark A. Thornton Papas, M.D. [facility] Electronically Signed   By: Lavonia Dana M.D.   On: 12/03/2019 14:27   MM 3D SCREEN BREAST BILATERAL  Result Date: 12/03/2019 CLINICAL DATA:  Screening. EXAM: DIGITAL SCREENING BILATERAL MAMMOGRAM WITH TOMO AND CAD COMPARISON:  Previous exam(s). ACR Breast Density Category b: There are scattered areas of fibroglandular density. FINDINGS: There are no findings suspicious for malignancy. Images were processed with CAD. IMPRESSION: No mammographic evidence of malignancy. A result letter of this screening mammogram will be mailed directly to the patient. RECOMMENDATION: Screening mammogram in one year. (Code:[id number]-B-01Y) BI-RADS CATEGORY  1: Negative. Electronically Signed   By: Lajean Manes M.D.   On: 12/03/2019 13:44       Assessment & Plan:   Problem List Items Addressed This Visit    Type 2 diabetes mellitus with vascular disease ([place])    Continue low carb diet and exercise.  Follow met b and a1c.   Lab Results  Component Value Date   HGBA1C 6.6 (H) 04/26/2020        Relevant Orders   Hemoglobin P2A   Basic metabolic panel   Loose stools    Loose stools.  Better.  Takes colestid.  Takes imodium prn.  Stable.  Follow.       Hypercholesteremia    On crestor.  Low cholesterol diet and exercise.  Follow lipid panel and liver function tests.        Relevant  Orders   TSH   Lipid panel   Hepatic function panel   History of CVA (cerebrovascular accident)    Continue aspirin.  Follow.       History of colonic polyps    Colonoscopy 09/2016.  Multiple polyps as outlined.  Recommended f/u colonoscopy in 3 years.  Follow up colonoscopy 11/2019 - diverticulosis.  Recommended f/u colonoscopy in 5 years.         Other Visit Diagnoses    Need for immunization against influenza    -  Primary   Relevant Orders   Flu Vaccine QUAD High Dose(Fluad) (Completed)       Einar Pheasant, MD

## 2020-04-30 ENCOUNTER — Encounter: Payer: Self-pay | Admitting: Internal Medicine

## 2020-04-30 DIAGNOSIS — R195 Other fecal abnormalities: Secondary | ICD-10-CM | POA: Insufficient documentation

## 2020-04-30 NOTE — Assessment & Plan Note (Addendum)
Colonoscopy 09/2016.  Multiple polyps as outlined.  Recommended f/u colonoscopy in 3 years.  Follow up colonoscopy 11/2019 - diverticulosis.  Recommended f/u colonoscopy in 5 years.

## 2020-04-30 NOTE — Assessment & Plan Note (Signed)
On crestor.  Low cholesterol diet and exercise.  Follow lipid panel and liver function tests.   

## 2020-04-30 NOTE — Assessment & Plan Note (Signed)
Continue low carb diet and exercise.  Follow met b and a1c.   Lab Results  Component Value Date   HGBA1C 6.6 (H) 04/26/2020

## 2020-04-30 NOTE — Assessment & Plan Note (Signed)
Continue aspirin.  Follow.

## 2020-04-30 NOTE — Assessment & Plan Note (Signed)
Loose stools.  Better.  Takes colestid.  Takes imodium prn.  Stable.  Follow.

## 2020-05-10 ENCOUNTER — Telehealth: Payer: Self-pay | Admitting: *Deleted

## 2020-05-10 NOTE — Telephone Encounter (Signed)
Patient left voicemail yesterday to call back regarding questions. Spoke with her and she had questions about her fasting blood sugars. Pt recently saw MD and reports that she was pleased with her numbers. Her A1C has decreased but pt was concerned about higher fasting blood sugars. She reports fasting's in 130's but she also had reading of 121 today. Discussed Dawn phenomenon and hormonal changes during the night that result in higher fasting numbers. She reports pp reading of 212 after eating oatmeal, fruit and nuts. Discussed various foods that may raise blood sugars and combination of foods. She will check her blood sugar again after she eats that same breakfast. She also reports much stress at this of the year and is aware that stress can raise blood sugars. Her doctor didn't want to make any medication changes. She was congratulated on her success with diabetes management and instructed to call back for any questions.

## 2020-06-16 ENCOUNTER — Ambulatory Visit: Payer: PPO | Admitting: Podiatry

## 2020-06-16 ENCOUNTER — Encounter: Payer: Self-pay | Admitting: Podiatry

## 2020-06-16 ENCOUNTER — Other Ambulatory Visit: Payer: Self-pay

## 2020-06-16 DIAGNOSIS — M79674 Pain in right toe(s): Secondary | ICD-10-CM

## 2020-06-16 DIAGNOSIS — B351 Tinea unguium: Secondary | ICD-10-CM | POA: Diagnosis not present

## 2020-06-16 DIAGNOSIS — D696 Thrombocytopenia, unspecified: Secondary | ICD-10-CM

## 2020-06-16 DIAGNOSIS — M2012 Hallux valgus (acquired), left foot: Secondary | ICD-10-CM | POA: Diagnosis not present

## 2020-06-16 DIAGNOSIS — E119 Type 2 diabetes mellitus without complications: Secondary | ICD-10-CM | POA: Diagnosis not present

## 2020-06-16 NOTE — Progress Notes (Signed)
This patient returns to my office for at risk foot care.  This patient requires this care by a professional since this patient will be at risk due to having diabetes and throbocytopenia..    This patient is unable to cut nails herself since the patient cannot reach her nails.These nails are painful walking and wearing shoes.  This patient presents for at risk foot care today.  General Appearance  Alert, conversant and in no acute stress.  Vascular  Dorsalis pedis and posterior tibial  pulses are palpable  bilaterally.  Capillary return is within normal limits  bilaterally. Temperature is within normal limits  bilaterally.  Neurologic  Senn-Weinstein monofilament wire test within normal limits  bilaterally. Muscle power within normal limits bilaterally.  Nails Thick disfigured discolored nails with subungual debris  from hallux to fifth toes bilaterally. No evidence of bacterial infection or drainage bilaterally.  Orthopedic  No limitations of motion  feet .  No crepitus or effusions noted.  No bony pathology or digital deformities noted. Plantar flexed 2,3 right foot.  Skin  normotropic skin  noted bilaterally.  No signs of infections or ulcers noted.   Asymptomatic porokeratosis sub 2 right foot.  Onychomycosis  Pain in right toes  Pain in left toes  Consent was obtained for treatment procedures.   Mechanical debridement of nails 1-5  bilaterally performed with a nail nipper.  Filed with dremel without incident.    Return office visit  4 months                   Told patient to return for periodic foot care and evaluation due to potential at risk complications.   Draysen Weygandt DPM  

## 2020-06-24 ENCOUNTER — Telehealth: Payer: Self-pay | Admitting: Internal Medicine

## 2020-06-24 ENCOUNTER — Other Ambulatory Visit (INDEPENDENT_AMBULATORY_CARE_PROVIDER_SITE_OTHER): Payer: PPO

## 2020-06-24 ENCOUNTER — Encounter: Payer: Self-pay | Admitting: Internal Medicine

## 2020-06-24 ENCOUNTER — Other Ambulatory Visit: Payer: Self-pay

## 2020-06-24 ENCOUNTER — Telehealth: Payer: Self-pay

## 2020-06-24 ENCOUNTER — Telehealth (INDEPENDENT_AMBULATORY_CARE_PROVIDER_SITE_OTHER): Payer: PPO | Admitting: Internal Medicine

## 2020-06-24 VITALS — Ht 65.0 in | Wt 175.0 lb

## 2020-06-24 DIAGNOSIS — R300 Dysuria: Secondary | ICD-10-CM | POA: Diagnosis not present

## 2020-06-24 DIAGNOSIS — N3000 Acute cystitis without hematuria: Secondary | ICD-10-CM

## 2020-06-24 DIAGNOSIS — R309 Painful micturition, unspecified: Secondary | ICD-10-CM

## 2020-06-24 MED ORDER — CIPROFLOXACIN HCL 500 MG PO TABS: 500.0000 mg | ORAL_TABLET | Freq: Two times a day (BID) | ORAL | 0 refills | Status: DC

## 2020-06-24 NOTE — Progress Notes (Signed)
Symptoms include pink coloration to the urine, painful urination, and burning with urination. Patient also experiencing frequency with decreased output.  Taking AZO [facility] brand over the counter.  Onset of yesterday evening.  Patient has collected a sample in sterile urine cup at 7:50 am.

## 2020-06-24 NOTE — Telephone Encounter (Signed)
Patient had a virtual today with Dr. Aundra Dubin. Dr. Aundra Dubin called in [facility]floxacin [facility]) [facility] MG [facility]. [facility], please sent prescription to [facility] [place] in [place] because they have it.

## 2020-06-24 NOTE — Progress Notes (Addendum)
Telephone Note  I connected with Victoria Harris   on 06/24/20 at  9:00 AM EST by a telephone and verified that I am speaking with the correct person using two identifiers.  Location patient: home, [place] Location provider:work or home office Persons participating in the virtual visit: patient, provider  I discussed the limitations of evaluation and management by telemedicine and the availability of in person appointments. The patient expressed understanding and agreed to proceed.   HPI: UTI sxs started yesterday with increased frequency, urgency and pink on tissue and darker blood in toilet with reduced muscle tone pelvic muscles. 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  Past Medical History:  Diagnosis Date  . Colon polyps    hyperplastic  . Diabetes mellitus without complication ([place])   . Diverticulosis   . Hx of adenomatous colonic polyps    tubular adenoma  . Hypercholesterolemia   . Serrated adenoma of colon   . Stroke (cerebrum) ([place])   . Thrombocytopenia ([place])   . Uterus descensus     Past Surgical History:  Procedure Laterality Date  . ABDOMINAL HYSTERECTOMY  2009  . CHOLECYSTECTOMY  2007  . COLONOSCOPY N/A 04/05/2015   Procedure: COLONOSCOPY;  Surgeon: Lollie Sails, MD;  Location: [facility] ENDOSCOPY;  Service: Endoscopy;  Laterality: N/A;  . COLONOSCOPY WITH PROPOFOL N/A 10/09/2016   Procedure: COLONOSCOPY WITH PROPOFOL;  Surgeon: Lollie Sails, MD;  Location: [facility] ENDOSCOPY;  Service: Endoscopy;  Laterality: N/A;  . COLONOSCOPY WITH PROPOFOL N/A 12/02/2019   Procedure: COLONOSCOPY WITH PROPOFOL;  Surgeon: Toledo, Benay Pike, MD;  Location: [facility] ENDOSCOPY;  Service: Gastroenterology;  Laterality: N/A;  . DILATION AND CURETTAGE OF UTERUS  2001 and 2006  . FOOT SURGERY  2007  . TONSILLECTOMY  1960  . TUBAL LIGATION    . vocal cord cyst removed       Current Outpatient  Medications:  .  aspirin 81 MG tablet, Take 81 mg by mouth daily., Disp: , Rfl:  .  Cholecalciferol 25 MCG (1000 UT) tablet, Take 1,000 Units by mouth daily. , Disp: , Rfl:  .  ciprofloxacin (CIPRO) 500 MG tablet, Take 1 tablet (500 mg total) by mouth 2 (two) times daily. With food x 3-5 days, Disp: 10 tablet, Rfl: 0 .  colestipol (COLESTID) 1 g tablet, Take 1 g by mouth daily. , Disp: , Rfl:  .  dorzolamide-timolol (COSOPT) 22.3-6.8 MG/ML ophthalmic solution, Place 1 drop into both eyes 2 (two) times daily., Disp: , Rfl: 5 .  glucose blood (ONETOUCH VERIO) test strip, USE AS INSTRUCTED TO CHECK BLOOD SUGARS DAILY. DX E11.9, Disp: 100 strip, Rfl: 12 .  Multiple Vitamins-Minerals (MULTIVITAMIN ADULT PO), Take by mouth daily., Disp: , Rfl:  .  Omega-3 Fatty Acids (OMEGA-3 FISH OIL) 1200 MG CAPS, Take 1,200 mg by mouth daily. , Disp: , Rfl:  .  Phenazopyridine HCl (AZO URINARY PAIN PO), Take by mouth., Disp: , Rfl:  .  Probiotic Product (PROBIOTIC DAILY) CAPS, Take 1 capsule by mouth daily., Disp: , Rfl:  .  rosuvastatin (CRESTOR) 5 MG tablet, TAKE 1 TABLET BY MOUTH EVERY DAY, Disp: 90 tablet, Rfl: 1 .  TRAVATAN Z 0.004 % SOLN ophthalmic solution, Place 1 drop into both eyes at bedtime., Disp: , Rfl:   EXAM:  VITALS per patient if applicable:  GENERAL: alert, oriented, appears well and in no acute distress  PSYCH/NEURO: pleasant  and cooperative, no obvious depression or anxiety, speech and thought processing grossly intact  ASSESSMENT AND PLAN:  Discussed the following assessment and plan:  Acute cystitis without hematuria - Plan: Urinalysis, Routine w reflex microscopic, Urine Culture, ciprofloxacin (CIPRO) 500 MG tablet bid 3-5 days  -we discussed possible serious and likely etiologies, options for evaluation and workup, limitations of telemedicine visit vs in person visit, treatment, treatment risks and precautions.     I discussed the assessment and treatment plan with the patient.  The patient was provided an opportunity to ask questions and all were answered. The patient agreed with the plan and demonstrated an understanding of the instructions.    Time spent 20 min Delorise Jackson, MD

## 2020-06-24 NOTE — Telephone Encounter (Signed)
Patient seen by dr Olivia Mackie today.   [place] Night - Cl TELEPHONE ADVICE RECORD [facility] Patient Name: Victoria Harris Gender: Female DOB: Jan 25, 1951 Age: 70 Y 42 M 16 D Return Phone Number: [phone] (Primary) Address: City/State/Zip: [place] [address] Client [facility] [place] Night - Cl Client Site [place] Physician Einar Pheasant - MD Contact Type Call Who Is Calling Patient / Member / Family / Caregiver Call Type Triage / Clinical Relationship To Patient Self Return Phone Number [phone] (Primary) Chief Complaint Urine, Blood In Reason for Call Medication Question / Request Initial Comment [name] states she has Sx with urination frequency and blood in urine. Would like an Rx. Translation No Nurse Assessment Nurse: Hilary Hertz, RN, Louanne Belton Date/Time Eilene Ghazi Time): 06/23/2020 6:25:24 PM Confirm and document reason for call. If symptomatic, describe symptoms. ---[name] states she has Sx with urination frequency, burning/pain with urination and blood in urine. Symptoms started yesterday. Denies fever or any other sx. Does the patient have any new or worsening symptoms? ---Yes Will a triage be completed? ---Yes Related visit to physician within the last 2 weeks? ---N/A Does the PT have any chronic conditions? (i.e. diabetes, asthma, this includes High risk factors for pregnancy, etc.) ---Yes List chronic conditions. ---preDM Is this a behavioral health or substance abuse call? ---No Guidelines Guideline Title Affirmed Question Affirmed Notes Nurse Date/Time (Eastern Time) Urine - Blood In Pain or burning with passing urine Hilary Hertz, RN, Louanne Belton 06/23/2020 6:27:22 PM Disp. Time Eilene Ghazi Time) Disposition Final User 06/23/2020 6:32:49 PM See PCP within 24 Hours Yes Hilary Hertz, RN, [place] Disagree/Comply Comply [name] Understands  Yes PLEASE NOTE: All timestamps contained within this report are represented as [place] Standard Time. CONFIDENTIALTY NOTICE: This fax transmission is intended only for the addressee. It contains information that is legally privileged, confidential or otherwise protected from use or disclosure. If you are not the intended recipient, you are strictly prohibited from reviewing, disclosing, copying using or disseminating any of this information or taking any action in reliance on or regarding this information. If you have received this fax in error, please notify us immediately by telephone so that we can arrange for its return to [place]. Phone: [phone], Toll-Free: [phone], Fax: [phone] Page: 2 of 2 Call Id: [id number] PreDisposition Call a family member Care Advice Given Per Guideline SEE PCP WITHIN 24 HOURS: * IF OFFICE WILL BE OPEN: You need to be examined within the next 24 hours. Call your doctor (or NP/PA) when the office opens and make an appointment. * Drink extra fluids. * Drink 8 to 10 cups (1,800 to 2,400 ml) of liquids a day. * Reason: This will water-down your urine and make it less painful to pass. If there is an infection, this will help wash out the germs from your bladder. * Bring in a sample of the bloody urine. * Keep it in the refrigerator until you leave. CARE ADVICE given per Urine, Blood In (Adult) guideline. CALL BACK IF: * Fever occurs * You become worse Referrals REFERRED TO PCP OFFICE

## 2020-06-24 NOTE — Patient Instructions (Signed)
Urinary Tract Infection, Adult  A urinary tract infection (UTI) is an infection of any part of the urinary tract. The urinary tract includes the kidneys, ureters, bladder, and urethra. These organs make, store, and get rid of urine in the body. An upper UTI affects the ureters and kidneys. A lower UTI affects the bladder and urethra. What are the causes? Most urinary tract infections are caused by bacteria in your genital area around your urethra, where urine leaves your body. These bacteria grow and cause inflammation of your urinary tract. What increases the risk? You are more likely to develop this condition if:  You have a urinary catheter that stays in place.  You are not able to control when you urinate or have a bowel movement (incontinence).  You are female and you: ? Use a spermicide or diaphragm for birth control. ? Have low estrogen levels. ? Are pregnant.  You have certain genes that increase your risk.  You are sexually active.  You take antibiotic medicines.  You have a condition that causes your flow of urine to slow down, such as: ? An enlarged prostate, if you are female. ? Blockage in your urethra. ? A kidney stone. ? A nerve condition that affects your bladder control (neurogenic bladder). ? Not getting enough to drink, or not urinating often.  You have certain medical conditions, such as: ? Diabetes. ? A weak disease-fighting system (immunesystem). ? Sickle cell disease. ? Gout. ? Spinal cord injury. What are the signs or symptoms? Symptoms of this condition include:  Needing to urinate right away (urgency).  Frequent urination. This may include small amounts of urine each time you urinate.  Pain or burning with urination.  Blood in the urine.  Urine that smells bad or unusual.  Trouble urinating.  Cloudy urine.  Vaginal discharge, if you are female.  Pain in the abdomen or the lower back. You may also have:  Vomiting or a decreased  appetite.  Confusion.  Irritability or tiredness.  A fever or chills.  Diarrhea. The first symptom in older adults may be confusion. In some cases, they may not have any symptoms until the infection has worsened. How is this diagnosed? This condition is diagnosed based on your medical history and a physical exam. You may also have other tests, including:  Urine tests.  Blood tests.  Tests for STIs (sexually transmitted infections). If you have had more than one UTI, a cystoscopy or imaging studies may be done to determine the cause of the infections. How is this treated? Treatment for this condition includes:  Antibiotic medicine.  Over-the-counter medicines to treat discomfort.  Drinking enough water to stay hydrated. If you have frequent infections or have other conditions such as a kidney stone, you may need to see a health care provider who specializes in the urinary tract (urologist). In rare cases, urinary tract infections can cause sepsis. Sepsis is a life-threatening condition that occurs when the body responds to an infection. Sepsis is treated in the hospital with IV antibiotics, fluids, and other medicines. Follow these instructions at home: Medicines  Take over-the-counter and prescription medicines only as told by your health care provider.  If you were prescribed an antibiotic medicine, take it as told by your health care provider. Do not stop using the antibiotic even if you start to feel better. General instructions  Make sure you: ? Empty your bladder often and completely. Do not hold urine for long periods of time. ? Empty your bladder after   sex. ? Wipe from front to back after urinating or having a bowel movement if you are female. Use each tissue only one time when you wipe.  Drink enough fluid to keep your urine pale yellow.  Keep all follow-up visits. This is important.   Contact a health care provider if:  Your symptoms do not get better after 1-2  days.  Your symptoms go away and then return. Get help right away if:  You have severe pain in your back or your lower abdomen.  You have a fever or chills.  You have nausea or vomiting. Summary  A urinary tract infection (UTI) is an infection of any part of the urinary tract, which includes the kidneys, ureters, bladder, and urethra.  Most urinary tract infections are caused by bacteria in your genital area.  Treatment for this condition often includes antibiotic medicines.  If you were prescribed an antibiotic medicine, take it as told by your health care provider. Do not stop using the antibiotic even if you start to feel better.  Keep all follow-up visits. This is important. This information is not intended to replace advice given to you by your health care provider. Make sure you discuss any questions you have with your health care provider. Document Revised: 12/25/2019 Document Reviewed: 12/25/2019 Elsevier Patient Education  2021 [employer]  

## 2020-06-25 LAB — MICROSCOPIC EXAMINATION: Casts: NONE SEEN /lpf

## 2020-06-25 LAB — URINALYSIS, ROUTINE W REFLEX MICROSCOPIC
Bilirubin, UA: NEGATIVE
Glucose, UA: NEGATIVE
Ketones, UA: NEGATIVE
Nitrite, UA: POSITIVE — AB
Protein,UA: NEGATIVE
RBC, UA: NEGATIVE
Specific Gravity, UA: 1.02 (ref 1.005–1.030)
Urobilinogen, Ur: 1 mg/dL (ref 0.2–1.0)
pH, UA: 5 (ref 5.0–7.5)

## 2020-06-27 ENCOUNTER — Telehealth: Payer: Self-pay

## 2020-06-27 NOTE — Telephone Encounter (Signed)
Culture pending

## 2020-06-27 NOTE — Telephone Encounter (Signed)
Pt called about urine results

## 2020-06-27 NOTE — Telephone Encounter (Signed)
Please advise Results are now available in epic

## 2020-06-28 LAB — URINE CULTURE

## 2020-06-28 NOTE — Telephone Encounter (Signed)
Patient informed and verbalized understanding.  Patient is taking the cipro.

## 2020-06-28 NOTE — Telephone Encounter (Signed)
Victoria Glow McLean-Scocuzza, MD  06/27/2020 11:42 AM EST Back to Top     Preliminary E Coli Uti

## 2020-06-28 NOTE — Telephone Encounter (Signed)
Pt called to get culture results

## 2020-08-25 ENCOUNTER — Other Ambulatory Visit: Payer: PPO

## 2020-08-29 ENCOUNTER — Other Ambulatory Visit (INDEPENDENT_AMBULATORY_CARE_PROVIDER_SITE_OTHER): Payer: PPO

## 2020-08-29 ENCOUNTER — Other Ambulatory Visit: Payer: Self-pay

## 2020-08-29 DIAGNOSIS — E7800 Pure hypercholesterolemia, unspecified: Secondary | ICD-10-CM | POA: Diagnosis not present

## 2020-08-29 DIAGNOSIS — E1159 Type 2 diabetes mellitus with other circulatory complications: Secondary | ICD-10-CM

## 2020-08-29 LAB — LIPID PANEL
Cholesterol: 161 mg/dL (ref 0–200)
HDL: 62.2 mg/dL (ref >39.00)
LDL Cholesterol: 72 mg/dL (ref 0–99)
NonHDL: 98.45
Total CHOL/HDL Ratio: 3
Triglycerides: 132 mg/dL (ref 0.0–149.0)
VLDL: 26.4 mg/dL (ref 0.0–40.0)

## 2020-08-29 LAB — HEPATIC FUNCTION PANEL
ALT: 25 U/L (ref 0–35)
AST: 24 U/L (ref 0–37)
Albumin: 4.3 g/dL (ref 3.5–5.2)
Alkaline Phosphatase: 63 U/L (ref 39–117)
Bilirubin, Direct: 0.2 mg/dL (ref 0.0–0.3)
Total Bilirubin: 0.8 mg/dL (ref 0.2–1.2)
Total Protein: 6.4 g/dL (ref 6.0–8.3)

## 2020-08-29 LAB — BASIC METABOLIC PANEL
BUN: 20 mg/dL (ref 6–23)
CO2: 29 mEq/L (ref 19–32)
Calcium: 9.4 mg/dL (ref 8.4–10.5)
Chloride: 105 mEq/L (ref 96–112)
Creatinine, Ser: 0.67 mg/dL (ref 0.40–1.20)
GFR: 88.33 mL/min (ref >60.00)
Glucose, Bld: 164 mg/dL — ABNORMAL HIGH (ref 70–99)
Potassium: 3.9 mEq/L (ref 3.5–5.1)
Sodium: 140 mEq/L (ref 135–145)

## 2020-08-29 LAB — HEMOGLOBIN A1C: Hgb A1c MFr Bld: 7.6 % — ABNORMAL HIGH (ref 4.6–6.5)

## 2020-08-29 LAB — TSH: TSH: 1.74 u[IU]/mL (ref 0.35–4.50)

## 2020-08-30 ENCOUNTER — Encounter: Payer: PPO | Admitting: Internal Medicine

## 2020-08-31 ENCOUNTER — Ambulatory Visit (INDEPENDENT_AMBULATORY_CARE_PROVIDER_SITE_OTHER): Payer: PPO | Admitting: Internal Medicine

## 2020-08-31 ENCOUNTER — Other Ambulatory Visit: Payer: Self-pay

## 2020-08-31 VITALS — BP 126/68 | HR 73 | Temp 98.4°F | Resp 16 | Ht 65.0 in | Wt 177.0 lb

## 2020-08-31 DIAGNOSIS — Z0000 Encounter for general adult medical examination without abnormal findings: Secondary | ICD-10-CM | POA: Diagnosis not present

## 2020-08-31 DIAGNOSIS — Z1231 Encounter for screening mammogram for malignant neoplasm of breast: Secondary | ICD-10-CM

## 2020-08-31 DIAGNOSIS — E1159 Type 2 diabetes mellitus with other circulatory complications: Secondary | ICD-10-CM

## 2020-08-31 DIAGNOSIS — E7800 Pure hypercholesterolemia, unspecified: Secondary | ICD-10-CM

## 2020-08-31 DIAGNOSIS — Z8673 Personal history of transient ischemic attack (TIA), and cerebral infarction without residual deficits: Secondary | ICD-10-CM | POA: Diagnosis not present

## 2020-08-31 DIAGNOSIS — D696 Thrombocytopenia, unspecified: Secondary | ICD-10-CM | POA: Diagnosis not present

## 2020-08-31 MED ORDER — METFORMIN HCL ER 500 MG PO TB24: 500.0000 mg | ORAL_TABLET | Freq: Every day | ORAL | 2 refills | Status: DC

## 2020-08-31 NOTE — Progress Notes (Signed)
[name], female   DOB: 02-08-1951, 70 y.o.   MRN: [id number]   Subjective:    [name], female    DOB: April 16, 1951, 70 y.o.   MRN: [id number]  HPI This visit occurred during the SARS-CoV-2 public health emergency.  Safety protocols were in place, including screening questions prior to the visit, additional usage of staff PPE, and extensive cleaning of exam room while observing appropriate contact time as indicated for disinfecting solutions.  Patient here for her physical exam.  She reports she has been feeling well.  Stays active, but has not been doing as much exercise with winter and cold weather.  No chest pain or sob reported.  No abdominal pain. Bowels stable.  Discussed labs.  a1c elevated.  Discussed diet and exercise.  Just had bone density - normal.  Colonoscopy [id number] - recommended f/u in 5 years.    Past Medical History:  Diagnosis Date  . Colon polyps    hyperplastic  . Diabetes mellitus without complication (HCC)   . Diverticulosis   . Hx of adenomatous colonic polyps    tubular adenoma  . Hypercholesterolemia   . Serrated adenoma of colon   . Stroke (cerebrum) (HCC)   . Thrombocytopenia (HCC)   . Uterus descensus    Past Surgical History:  Procedure Laterality Date  . ABDOMINAL HYSTERECTOMY  2009  . CHOLECYSTECTOMY  2007  . COLONOSCOPY N/A 04/05/2015   Procedure: COLONOSCOPY;  Surgeon: Christena Deem, MD;  Location: [facility] ENDOSCOPY;  Service: Endoscopy;  Laterality: N/A;  . COLONOSCOPY WITH PROPOFOL N/A 10/09/2016   Procedure: COLONOSCOPY WITH PROPOFOL;  Surgeon: Christena Deem, MD;  Location: [facility] ENDOSCOPY;  Service: Endoscopy;  Laterality: N/A;  . COLONOSCOPY WITH PROPOFOL N/A 12/02/2019   Procedure: COLONOSCOPY WITH PROPOFOL;  Surgeon: Toledo, Boykin Nearing, MD;  Location: [facility] ENDOSCOPY;  Service: Gastroenterology;  Laterality: N/A;  . DILATION AND CURETTAGE OF UTERUS  2001 and 2006  . FOOT SURGERY  2007  .  TONSILLECTOMY  1960  . TUBAL LIGATION    . vocal cord cyst removed     Family History  Problem Relation Age of Onset  . Diabetes Other   . Colon cancer Mother   . Diabetes Mother   . Kidney disease Mother   . Heart disease Mother   . Stroke Paternal Grandmother   . Stroke Paternal Grandfather   . Prostate cancer Father   . Diabetes Father   . Glaucoma Father   . Breast cancer Paternal Aunt        x2  . Uterine cancer Maternal Aunt   . Diabetes Brother   . Diabetes Brother    Social History   Socioeconomic History  . Marital status: Married    Spouse name: Not on file  . Number of children: Not on file  . Years of education: Not on file  . Highest education level: Not on file  Occupational History  . Not on file  Tobacco Use  . Smoking status: Never Smoker  . Smokeless tobacco: Never Used  Vaping Use  . Vaping Use: Never used  Substance and Sexual Activity  . Alcohol use: No    Alcohol/week: 0.0 standard drinks  . Drug use: No  . Sexual activity: Never  Other Topics Concern  . Not on file  Social History Narrative  . Not on file   Social Determinants of Health   Financial Resource Strain: Low Risk   . Difficulty of  Paying Living Expenses: Not hard at all  Food Insecurity: No Food Insecurity  . Worried About Charity fundraiser in the Last Year: Never true  . Ran Out of Food in the Last Year: Never true  Transportation Needs: No Transportation Needs  . Lack of Transportation (Medical): No  . Lack of Transportation (Non-Medical): No  Physical Activity: Insufficiently Active  . Days of Exercise per Week: 3 days  . Minutes of Exercise per Session: 20 min  Stress: No Stress Concern Present  . Feeling of Stress : Not at all  Social Connections: Unknown  . Frequency of Communication with Friends and Family: Not on file  . Frequency of Social Gatherings with Friends and Family: Not on file  . Attends Religious Services: Not on file  . Active Member of Clubs or  Organizations: Not on file  . Attends Archivist Meetings: Not on file  . Marital Status: Married    Outpatient Encounter Medications as of 08/31/2020  Medication Sig  . metFORMIN (GLUCOPHAGE XR) 500 MG 24 hr tablet Take 1 tablet (500 mg total) by mouth daily with breakfast.  . aspirin 81 MG tablet Take 81 mg by mouth daily.  . Cholecalciferol 25 MCG (1000 UT) tablet Take 1,000 Units by mouth daily.   . colestipol (COLESTID) 1 g tablet Take 1 g by mouth daily.   . dorzolamide-timolol (COSOPT) 22.3-6.8 MG/ML ophthalmic solution Place 1 drop into both eyes 2 (two) times daily.  Marland Kitchen glucose blood (ONETOUCH VERIO) test strip USE AS INSTRUCTED TO CHECK BLOOD SUGARS DAILY. DX E11.9  . Multiple Vitamins-Minerals (MULTIVITAMIN ADULT PO) Take by mouth daily.  . Omega-3 Fatty Acids (OMEGA-3 FISH OIL) 1200 MG CAPS Take 1,200 mg by mouth daily.   . Probiotic Product (PROBIOTIC DAILY) CAPS Take 1 capsule by mouth daily.  . rosuvastatin (CRESTOR) 5 MG tablet TAKE 1 TABLET BY MOUTH EVERY DAY  . TRAVATAN Z 0.004 % SOLN ophthalmic solution Place 1 drop into both eyes at bedtime.  . [DISCONTINUED] ciprofloxacin (CIPRO) 500 MG tablet Take 1 tablet (500 mg total) by mouth 2 (two) times daily. With food x 3-5 days  . [DISCONTINUED] Phenazopyridine HCl (AZO URINARY PAIN PO) Take by mouth.   No facility-administered encounter medications on file as of 08/31/2020.    Review of Systems  Constitutional: Negative for appetite change and unexpected weight change.  HENT: Negative for congestion, sinus pressure and sore throat.   Eyes: Negative for pain and visual disturbance.  Respiratory: Negative for cough, chest tightness and shortness of breath.   Cardiovascular: Negative for chest pain, palpitations and leg swelling.  Gastrointestinal: Negative for abdominal pain, diarrhea, nausea and vomiting.  Genitourinary: Negative for difficulty urinating and dysuria.  Musculoskeletal: Negative for joint swelling  and myalgias.  Skin: Negative for color change and rash.  Neurological: Negative for dizziness, light-headedness and headaches.  Hematological: Negative for adenopathy. Does not bruise/bleed easily.  Psychiatric/Behavioral: Negative for agitation and dysphoric mood.       Objective:    Physical Exam Vitals reviewed.  Constitutional:      General: She is not in acute distress.    Appearance: Normal appearance. She is well-developed.  HENT:     Head: Normocephalic and atraumatic.     Right Ear: External ear normal.     Left Ear: External ear normal.  Eyes:     General: No scleral icterus.       Right eye: No discharge.  Left eye: No discharge.     Conjunctiva/sclera: Conjunctivae normal.  Neck:     Thyroid: No thyromegaly.  Cardiovascular:     Rate and Rhythm: Normal rate and regular rhythm.  Pulmonary:     Effort: No tachypnea, accessory muscle usage or respiratory distress.     Breath sounds: Normal breath sounds. No decreased breath sounds or wheezing.  Chest:  Breasts:     Right: No inverted nipple, mass, nipple discharge or tenderness (no axillary adenopathy).     Left: No inverted nipple, mass, nipple discharge or tenderness (no axilarry adenopathy).    Abdominal:     General: Bowel sounds are normal.     Palpations: Abdomen is soft.     Tenderness: There is no abdominal tenderness.  Musculoskeletal:        General: No swelling or tenderness.     Cervical back: Neck supple. No tenderness.  Lymphadenopathy:     Cervical: No cervical adenopathy.  Skin:    Findings: No erythema or rash.  Neurological:     Mental Status: She is alert and oriented to person, place, and time.  Psychiatric:        Mood and Affect: Mood normal.        Behavior: Behavior normal.     BP 126/68   Pulse 73   Temp 98.4 F (36.9 C) (Oral)   Resp 16   Ht $R'5\' 5"'lK$  (1.651 m)   Wt 177 lb (80.3 kg)   SpO2 98%   BMI 29.45 kg/m  Wt Readings from Last 3 Encounters:  08/31/20 177 lb  (80.3 kg)  06/24/20 175 lb (79.4 kg)  04/29/20 176 lb 9.6 oz (80.1 kg)     Lab Results  Component Value Date   WBC 5.1 12/14/2019   HGB 14.6 12/14/2019   HCT 42.0 12/14/2019   PLT 152.0 12/14/2019   GLUCOSE 164 (H) 08/29/2020   CHOL 161 08/29/2020   TRIG 132.0 08/29/2020   HDL 62.20 08/29/2020   LDLCALC 72 08/29/2020   ALT 25 08/29/2020   AST 24 08/29/2020   NA 140 08/29/2020   K 3.9 08/29/2020   CL 105 08/29/2020   CREATININE 0.67 08/29/2020   BUN 20 08/29/2020   CO2 29 08/29/2020   TSH 1.74 08/29/2020   HGBA1C 7.6 (H) 08/29/2020   MICROALBUR <0.7 12/14/2019    DG Bone Density  Result Date: 12/03/2019 EXAM: DUAL X-RAY ABSORPTIOMETRY (DXA) FOR BONE MINERAL DENSITY IMPRESSION: Your patient Kimberlly Norgard completed a BMD test on 12/03/2019 using the [employer] (software version: 14.10) manufactured by [employer]. The following summarizes the results of our evaluation. Technologist:vlm PATIENT BIOGRAPHICAL: Name: Belvia, Gotschall Patient ID: [id number] Birth Date: 07-02-1950 Height: 64.5 in. Gender: Female Exam Date: 12/03/2019 Weight: 176.0 lbs. Indications: Caucasian, Diabetic, Postmenopausal Fractures: Foot Treatments: Calcium, Vitamin D DENSITOMETRY RESULTS: Site         Region     Measured Date Measured Age WHO Classification Young Adult T-score BMD         %Change vs. Previous Significant Change (*) DualFemur Neck Right 12/03/2019 69.0 Normal -0.8 0.923 g/cm2 - - Left Forearm Radius 33% 12/03/2019 70.0 Normal -0.2 0.862 g/cm2 - - ASSESSMENT: The BMD measured at Femur Neck Right is 0.923 g/cm2 with a T-score of -0.8. This patient is considered NORMAL according to [facility]) criteria. Lumbar spine was not utilized due to advanced degenerative changes. The scan quality is good. World Health Organization [facility]) criteria  for post-menopausal, Caucasian Women: Normal:                   T-score at or above -1 SD Osteopenia/low bone mass:  T-score between -1 and -2.5 SD Osteoporosis:             T-score at or below -2.5 SD RECOMMENDATIONS: 1. All patients should optimize calcium and vitamin D intake. 2. Consider FDA-approved medical therapies in postmenopausal women and men aged 34 years and older, based on the following: a. A hip or vertebral(clinical or morphometric) fracture b. T-score < -2.5 at the femoral neck or spine after appropriate evaluation to exclude secondary causes c. Low bone mass (T-score between -1.0 and -2.5 at the femoral neck or spine) and a 10-year probability of a hip fracture > 3% or a 10-year probability of a major osteoporosis-related fracture > 20% based on the [place]-adapted WHO algorithm 3. Clinician judgment and/or patient preferences may indicate treatment for people with 10-year fracture probabilities above or below these levels FOLLOW-UP: People with diagnosed cases of osteoporosis or at high risk for fracture should have regular bone mineral density tests. For patients eligible for Medicare, routine testing is allowed once every 2 years. The testing frequency can be increased to one year for patients who have rapidly progressing disease, those who are receiving or discontinuing medical therapy to restore bone mass, or have additional risk factors. I have reviewed this report, and agree with the above findings. Mark A. Thornton Papas, M.D. [facility] Electronically Signed   By: Lavonia Dana M.D.   On: 12/03/2019 14:27   MM 3D SCREEN BREAST BILATERAL  Result Date: 12/03/2019 CLINICAL DATA:  Screening. EXAM: DIGITAL SCREENING BILATERAL MAMMOGRAM WITH TOMO AND CAD COMPARISON:  Previous exam(s). ACR Breast Density Category b: There are scattered areas of fibroglandular density. FINDINGS: There are no findings suspicious for malignancy. Images were processed with CAD. IMPRESSION: No mammographic evidence of malignancy. A result letter of this screening mammogram will be mailed directly to the patient. RECOMMENDATION:  Screening mammogram in one year. (Code:[id number]-B-01Y) BI-RADS CATEGORY  1: Negative. Electronically Signed   By: Lajean Manes M.D.   On: 12/03/2019 13:44       Assessment & Plan:   Problem List Items Addressed This Visit    Health care maintenance    Physical today 08/31/20.  Mammogram 12/03/19 - Birads I.  Colonoscopy 12/02/19 - moderate diverticulosis otherwise normal.  Bone density 12/03/19 - normal.       History of CVA (cerebrovascular accident)    Continue aspirin.       Hypercholesteremia    Continue crestor.  Low cholesterol diet and exercise.  Follow lipid panel and liver function tests.       Relevant Orders   Hepatic function panel   Lipid panel   Thrombocytopenia (HCC)    Follow cbc.       Relevant Orders   CBC with Differential/Platelet   Type 2 diabetes mellitus with vascular disease ([place])    Discussed low carb diet and exercise.  Plans to start exercising more and plans to watch diet more.  Given a1c 7.6, will restart extended release metformin.  Follow met b and a1c.  Notify me if problems with metformin.       Relevant Medications   metFORMIN (GLUCOPHAGE XR) 500 MG 24 hr tablet   Other Relevant Orders   Hemoglobin P9J   Basic metabolic panel    Other Visit Diagnoses    Routine general medical  examination at a health care facility    -  Primary   Visit for screening mammogram       Relevant Orders   MM 3D SCREEN BREAST BILATERAL       Einar Pheasant, MD

## 2020-09-04 ENCOUNTER — Encounter: Payer: Self-pay | Admitting: Internal Medicine

## 2020-09-04 NOTE — Assessment & Plan Note (Signed)
Continue aspirin 

## 2020-09-04 NOTE — Assessment & Plan Note (Signed)
Continue crestor.  Low cholesterol diet and exercise. Follow lipid panel and liver function tests.   

## 2020-09-04 NOTE — Assessment & Plan Note (Signed)
Discussed low carb diet and exercise.  Plans to start exercising more and plans to watch diet more.  Given a1c 7.6, will restart extended release metformin.  Follow met b and a1c.  Notify me if problems with metformin.

## 2020-09-04 NOTE — Assessment & Plan Note (Signed)
Follow cbc.  

## 2020-09-04 NOTE — Assessment & Plan Note (Signed)
Physical today 08/31/20.  Mammogram 12/03/19 - Birads I.  Colonoscopy 12/02/19 - moderate diverticulosis otherwise normal.  Bone density 12/03/19 - normal.

## 2020-09-14 DIAGNOSIS — L578 Other skin changes due to chronic exposure to nonionizing radiation: Secondary | ICD-10-CM | POA: Diagnosis not present

## 2020-09-14 DIAGNOSIS — Z872 Personal history of diseases of the skin and subcutaneous tissue: Secondary | ICD-10-CM | POA: Diagnosis not present

## 2020-09-14 DIAGNOSIS — Z86018 Personal history of other benign neoplasm: Secondary | ICD-10-CM | POA: Diagnosis not present

## 2020-09-14 DIAGNOSIS — L300 Nummular dermatitis: Secondary | ICD-10-CM | POA: Diagnosis not present

## 2020-09-14 DIAGNOSIS — L821 Other seborrheic keratosis: Secondary | ICD-10-CM | POA: Diagnosis not present

## 2020-09-14 DIAGNOSIS — L570 Actinic keratosis: Secondary | ICD-10-CM | POA: Diagnosis not present

## 2020-09-14 DIAGNOSIS — Z85828 Personal history of other malignant neoplasm of skin: Secondary | ICD-10-CM | POA: Diagnosis not present

## 2020-09-14 DIAGNOSIS — L918 Other hypertrophic disorders of the skin: Secondary | ICD-10-CM | POA: Diagnosis not present

## 2020-09-22 ENCOUNTER — Telehealth: Payer: Self-pay | Admitting: Internal Medicine

## 2020-09-22 ENCOUNTER — Other Ambulatory Visit: Payer: PPO

## 2020-09-22 ENCOUNTER — Other Ambulatory Visit: Payer: Self-pay

## 2020-09-22 DIAGNOSIS — D696 Thrombocytopenia, unspecified: Secondary | ICD-10-CM

## 2020-09-22 DIAGNOSIS — E7800 Pure hypercholesterolemia, unspecified: Secondary | ICD-10-CM

## 2020-09-22 DIAGNOSIS — R399 Unspecified symptoms and signs involving the genitourinary system: Secondary | ICD-10-CM

## 2020-09-22 DIAGNOSIS — E1159 Type 2 diabetes mellitus with other circulatory complications: Secondary | ICD-10-CM

## 2020-09-22 NOTE — Telephone Encounter (Signed)
Pt seeing [employer].

## 2020-09-22 NOTE — Telephone Encounter (Signed)
LMTCB needs appt

## 2020-09-22 NOTE — Telephone Encounter (Signed)
patient called in wanted to drop off a urine sample she thinks she has a UTI  Burning when urinating

## 2020-09-22 NOTE — Addendum Note (Signed)
Addended by: Leeanne Rio on: 09/22/2020 02:18 PM   Modules accepted: Orders

## 2020-09-23 ENCOUNTER — Encounter: Payer: Self-pay | Admitting: Internal Medicine

## 2020-09-23 ENCOUNTER — Telehealth (INDEPENDENT_AMBULATORY_CARE_PROVIDER_SITE_OTHER): Payer: PPO | Admitting: Internal Medicine

## 2020-09-23 ENCOUNTER — Other Ambulatory Visit: Payer: Self-pay | Admitting: Internal Medicine

## 2020-09-23 VITALS — Ht 65.0 in | Wt 177.0 lb

## 2020-09-23 DIAGNOSIS — R399 Unspecified symptoms and signs involving the genitourinary system: Secondary | ICD-10-CM | POA: Diagnosis not present

## 2020-09-23 DIAGNOSIS — N8110 Cystocele, unspecified: Secondary | ICD-10-CM | POA: Diagnosis not present

## 2020-09-23 DIAGNOSIS — N3000 Acute cystitis without hematuria: Secondary | ICD-10-CM

## 2020-09-23 LAB — URINALYSIS
Bilirubin, UA: NEGATIVE
Glucose, UA: NEGATIVE
Ketones, UA: NEGATIVE
Nitrite, UA: NEGATIVE
Protein,UA: NEGATIVE
Specific Gravity, UA: 1.016 (ref 1.005–1.030)
Urobilinogen, Ur: 0.2 mg/dL (ref 0.2–1.0)
pH, UA: 5.5 (ref 5.0–7.5)

## 2020-09-23 MED ORDER — AMOXICILLIN-POT CLAVULANATE 875-125 MG PO TABS: 1.0000 | ORAL_TABLET | Freq: Two times a day (BID) | ORAL | 0 refills | Status: DC

## 2020-09-23 NOTE — Patient Instructions (Signed)
urogynecology Dr. Sherlene [facility]ams [facility] in [place] let us know if referral wanted  Urinary Tract Infection, Adult  A urinary tract infection (UTI) is an infection of any part of the urinary tract. The urinary tract includes the kidneys, ureters, bladder, and urethra. These organs make, store, and get rid of urine in the body. An upper UTI affects the ureters and kidneys. A lower UTI affects the bladder and urethra. What are the causes? Most urinary tract infections are caused by bacteria in your genital area around your urethra, where urine leaves your body. These bacteria grow and cause inflammation of your urinary tract. What increases the risk? You are more likely to develop this condition if:  You have a urinary catheter that stays in place.  You are not able to control when you urinate or have a bowel movement (incontinence).  You are female and you: ? Use a spermicide or diaphragm for birth control. ? Have low estrogen levels. ? Are pregnant.  You have certain genes that increase your risk.  You are sexually active.  You take antibiotic medicines.  You have a condition that causes your flow of urine to slow down, such as: ? An enlarged prostate, if you are female. ? Blockage in your urethra. ? A kidney stone. ? A nerve condition that affects your bladder control (neurogenic bladder). ? Not getting enough to drink, or not urinating often.  You have certain medical conditions, such as: ? Diabetes. ? A weak disease-fighting system (immunesystem). ? Sickle cell disease. ? Gout. ? Spinal cord injury. What are the signs or symptoms? Symptoms of this condition include:  Needing to urinate right away (urgency).  Frequent urination. This may include small amounts of urine each time you urinate.  Pain or burning with urination.  Blood in the urine.  Urine that smells bad or unusual.  Trouble urinating.  Cloudy urine.  Vaginal discharge, if you are  female.  Pain in the abdomen or the lower back. You may also have:  Vomiting or a decreased appetite.  Confusion.  Irritability or tiredness.  A fever or chills.  Diarrhea. The first symptom in older adults may be confusion. In some cases, they may not have any symptoms until the infection has worsened. How is this diagnosed? This condition is diagnosed based on your medical history and a physical exam. You may also have other tests, including:  Urine tests.  Blood tests.  Tests for STIs (sexually transmitted infections). If you have had more than one UTI, a cystoscopy or imaging studies may be done to determine the cause of the infections. How is this treated? Treatment for this condition includes:  Antibiotic medicine.  Over-the-counter medicines to treat discomfort.  Drinking enough water to stay hydrated. If you have frequent infections or have other conditions such as a kidney stone, you may need to see a health care provider who specializes in the urinary tract (urologist). In rare cases, urinary tract infections can cause sepsis. Sepsis is a life-threatening condition that occurs when the body responds to an infection. Sepsis is treated in the hospital with IV antibiotics, fluids, and other medicines. Follow these instructions at home: Medicines  Take over-the-counter and prescription medicines only as told by your health care provider.  If you were prescribed an antibiotic medicine, take it as told by your health care provider. Do not stop using the antibiotic even if you start to feel better. General instructions  Make sure you: ? Empty your bladder often and completely.  Do not hold urine for long periods of time. ? Empty your bladder after sex. ? Wipe from front to back after urinating or having a bowel movement if you are female. Use each tissue only one time when you wipe.  Drink enough fluid to keep your urine pale yellow.  Keep all follow-up visits. This  is important.   Contact a health care provider if:  Your symptoms do not get better after 1-2 days.  Your symptoms go away and then return. Get help right away if:  You have severe pain in your back or your lower abdomen.  You have a fever or chills.  You have nausea or vomiting. Summary  A urinary tract infection (UTI) is an infection of any part of the urinary tract, which includes the kidneys, ureters, bladder, and urethra.  Most urinary tract infections are caused by bacteria in your genital area.  Treatment for this condition often includes antibiotic medicines.  If you were prescribed an antibiotic medicine, take it as told by your health care provider. Do not stop using the antibiotic even if you start to feel better.  Keep all follow-up visits. This is important. This information is not intended to replace advice given to you by your health care provider. Make sure you discuss any questions you have with your health care provider. Document Revised: 12/25/2019 Document Reviewed: 12/25/2019 Elsevier Patient Education  2021 [place].  Pelvic Organ Prolapse Pelvic organ prolapse is a condition in women that involves the stretching, bulging, or dropping of pelvic organs into an abnormal position, past the opening of the vagina. It happens when the muscles and tissues that surround and support pelvic structures become weak or stretched. Pelvic organ prolapse can involve the:  Vagina (vaginal prolapse).  Uterus (uterine prolapse).  Bladder (cystocele).  Rectum (rectocele).  Intestines (enterocele). When organs other than the vagina are involved, they often bulge into the vagina or protrude from the vagina, depending on how severe the prolapse is. What are the causes? This condition may be caused by:  Pregnancy, labor, and childbirth.  Past pelvic surgery.  Lower levels of the hormone estrogen due to menopause.  Consistently lifting more than 50 lb (23  kg).  Obesity.  Long-term difficulty passing stool (chronic constipation).  Long-term, or chronic, cough.  Fluid buildup in the abdomen due to certain conditions. What are the signs or symptoms? Symptoms of this condition include:  Leaking a little urine (loss of bladder control) when you cough, sneeze, strain, and exercise (stress incontinence). This may be worse immediately after childbirth. It may gradually improve over time.  Feeling pressure in your pelvis or vagina. This pressure may increase when you cough or when you are passing stool.  A bulge that protrudes from the opening of your vagina.  Difficulty passing urine or stool.  Pain in your lower back.  Pain or discomfort during sex, or decreased interest in sex.  Repeated bladder infections (urinary tract infections).  Difficulty inserting a tampon. In some people, this condition causes no symptoms. How is this diagnosed? This condition may be diagnosed based on a vaginal and rectal exam. During the exam, you may be asked to cough and strain while you are lying down, sitting, and standing up. Your health care provider will determine if other tests are required, such as bladder function tests. How is this treated? Treatment for this condition may depend on your symptoms. Treatment may include:  Lifestyle changes, such as drinking plenty of fluids and eating  foods that are high in fiber.  Emptying your bladder at scheduled times (bladder training therapy). This can help reduce or avoid urinary incontinence.  Estrogen. This may help mild prolapse by increasing the strength and tone of pelvic floor muscles.  Kegel exercises. These may help mild cases of prolapse by strengthening and tightening the muscles of the pelvic floor.  A soft, flexible device that helps support the vaginal walls and keep pelvic organs in place (pessary). This is inserted into your vagina by your health care provider.  Surgery. This is often the  only form of treatment for severe prolapse. Follow these instructions at home: Eating and drinking  Avoid drinking beverages that contain caffeine or alcohol.  Increase your intake of high-fiber foods to decrease constipation and straining during bowel movements. Activity  Lose weight if recommended by your health care provider.  Avoid heavy lifting and straining with exercise and work. Do not hold your breath when you perform mild to moderate lifting and exercise activities. Limit your activities as directed by your health care provider.  Do Kegel exercises as directed by your health care provider. To do this: ? Squeeze your pelvic floor muscles tight. You should feel a tight lift in your rectal area and a tightness in your vaginal area. Keep your stomach, buttocks, and legs relaxed. ? Hold the muscles tight for up to 10 seconds. Then relax your muscles. ? Repeat this exercise 50 times a day, or as much as told by your health care provider. Continue to do this exercise for at least 4-6 weeks, or for as long as told by your health care provider. General instructions  Take over-the-counter and prescription medicines only as told by your health care provider.  Wear a sanitary pad or adult diapers if you have urinary incontinence.  If you have a pessary, take care of it as told by your health care provider.  Keep all follow-up visits. This is important. Contact a health care provider if you:  Have symptoms that interfere with your daily activities or sex life.  Need medicine to help with the discomfort.  Notice bleeding from your vagina that is not related to your menstrual period.  Have a fever.  Have pain or bleeding when you urinate.  Have bleeding when you pass stool.  Pass urine when you have sex.  Have chronic constipation.  Have a pessary that falls out.  Have a foul-smelling vaginal discharge.  Have an unusual, low pain in your abdomen. Get help right away if  you:  Cannot pass urine. Summary  Pelvic organ prolapse is the stretching, bulging, or dropping of pelvic organs into an abnormal position. It happens when the muscles and tissues that surround and support pelvic structures become weak or stretched.  When organs other than the vagina are involved, they often bulge into the vagina or protrude from it, depending on how severe the prolapse is.  In most cases, this condition needs to be treated only if it produces symptoms. Treatment may include lifestyle changes, estrogen, Kegel exercises, pessary insertion, or surgery.  Avoid heavy lifting and straining with exercise and work. Do not hold your breath when you perform mild to moderate lifting and exercise activities. Limit your activities as directed by your health care provider. This information is not intended to replace advice given to you by your health care provider. Make sure you discuss any questions you have with your health care provider. Document Revised: 11/09/2019 Document Reviewed: 11/09/2019 Elsevier Patient Education  [place].

## 2020-09-23 NOTE — Progress Notes (Signed)
Telephone Note  I connected with Victoria Harris   on 09/23/20 at  8:00 AM EDT by telephone and verified that I am speaking with the correct person using two identifiers.  Location patient: home, [place] Location provider:work or home office Persons participating in the virtual visit: patient, provider  I discussed the limitations of evaluation and management by telemedicine and the availability of in person appointments. The patient expressed understanding and agreed to proceed.   HPI: UTI 06/24/20 E coli now since 09/21/20 with burning, freq, with h/o uterine prolapse s/p hysterectomy now with bladder prolapse and h/o incontinence. She had 4 vaginal births 1st baby 10.5 lbs lived, 2nd 7 lbs died due to heart condition, 3rd baby 9 lbs 14 ounces, 4th baby ~10 lbs   -COVID-19 vaccine status: no record  ROS: See pertinent positives and negatives per HPI.  Past Medical History:  Diagnosis Date  . Colon polyps    hyperplastic  . Diabetes mellitus without complication ([place])   . Diverticulosis   . Hx of adenomatous colonic polyps    tubular adenoma  . Hypercholesterolemia   . Serrated adenoma of colon   . Stroke (cerebrum) ([place])   . Thrombocytopenia ([place])   . Uterus descensus     Past Surgical History:  Procedure Laterality Date  . ABDOMINAL HYSTERECTOMY  2009  . CHOLECYSTECTOMY  2007  . COLONOSCOPY N/A 04/05/2015   Procedure: COLONOSCOPY;  Surgeon: Lollie Sails, MD;  Location: [facility] ENDOSCOPY;  Service: Endoscopy;  Laterality: N/A;  . COLONOSCOPY WITH PROPOFOL N/A 10/09/2016   Procedure: COLONOSCOPY WITH PROPOFOL;  Surgeon: Lollie Sails, MD;  Location: [facility] ENDOSCOPY;  Service: Endoscopy;  Laterality: N/A;  . COLONOSCOPY WITH PROPOFOL N/A 12/02/2019   Procedure: COLONOSCOPY WITH PROPOFOL;  Surgeon: Toledo, Benay Pike, MD;  Location: [facility] ENDOSCOPY;  Service: Gastroenterology;  Laterality: N/A;  . DILATION AND CURETTAGE OF UTERUS  2001 and 2006  . FOOT SURGERY  2007  .  TONSILLECTOMY  1960  . TUBAL LIGATION    . vocal cord cyst removed       Current Outpatient Medications:  .  amoxicillin-clavulanate (AUGMENTIN) 875-125 MG tablet, Take 1 tablet by mouth 2 (two) times daily. With food, Disp: 14 tablet, Rfl: 0 .  aspirin 81 MG tablet, Take 81 mg by mouth daily., Disp: , Rfl:  .  Cholecalciferol 25 MCG (1000 UT) tablet, Take 1,000 Units by mouth daily. , Disp: , Rfl:  .  colestipol (COLESTID) 1 g tablet, Take 1 g by mouth daily. , Disp: , Rfl:  .  CRANBERRY PO, Take by mouth., Disp: , Rfl:  .  dorzolamide-timolol (COSOPT) 22.3-6.8 MG/ML ophthalmic solution, Place 1 drop into both eyes 2 (two) times daily., Disp: , Rfl: 5 .  glucose blood (ONETOUCH VERIO) test strip, USE AS INSTRUCTED TO CHECK BLOOD SUGARS DAILY. DX E11.9, Disp: 100 strip, Rfl: 12 .  metFORMIN (GLUCOPHAGE XR) 500 MG 24 hr tablet, Take 1 tablet (500 mg total) by mouth daily with breakfast., Disp: 30 tablet, Rfl: 2 .  Multiple Vitamins-Minerals (MULTIVITAMIN ADULT PO), Take by mouth daily., Disp: , Rfl:  .  Omega-3 Fatty Acids (OMEGA-3 FISH OIL) 1200 MG CAPS, Take 1,200 mg by mouth daily. , Disp: , Rfl:  .  Probiotic Product (PROBIOTIC DAILY) CAPS, Take 1 capsule by mouth daily., Disp: , Rfl:  .  rosuvastatin (CRESTOR) 5 MG tablet, TAKE 1 TABLET BY MOUTH EVERY DAY, Disp: 90 tablet, Rfl: 1 .  TRAVATAN Z 0.004 % SOLN ophthalmic  solution, Place 1 drop into both eyes at bedtime., Disp: , Rfl:   EXAM:  VITALS per patient if applicable:  GENERAL: alert, oriented, appears well and in no acute distress  PSYCH/NEURO: pleasant and cooperative, no obvious depression or anxiety, speech and thought processing grossly intact  ASSESSMENT AND PLAN:  Discussed the following assessment and plan:  UTI symptoms - Plan: amoxicillin-clavulanate (AUGMENTIN) 875-125 MG tablet  Acute cystitis without hematuria - Plan: amoxicillin-clavulanate (AUGMENTIN) 875-125 MG tablet  Prolapse of female bladder,  acquired urogynecology Dr. Sherlene [facility]ams [facility] in [place] let us know if referral wanted  For now pt declines  Culture pending will let know when results   -we discussed possible serious and likely etiologies, options for evaluation and workup, limitations of telemedicine visit vs in person visit, treatment, treatment risks and precautions.     I discussed the assessment and treatment plan with the patient. The patient was provided an opportunity to ask questions and all were answered. The patient agreed with the plan and demonstrated an understanding of the instructions.    Time spent 20 min Delorise Jackson, MD

## 2020-09-23 NOTE — Progress Notes (Signed)
Patient states she has a prolapsed bladder that is painful when she sits to use the bathroom. Having burning and frequency.

## 2020-09-25 LAB — URINE CULTURE

## 2020-09-28 NOTE — Addendum Note (Signed)
Addended by: Orland Mustard on: 09/28/2020 05:58 PM   Modules accepted: Orders

## 2020-10-17 ENCOUNTER — Ambulatory Visit (INDEPENDENT_AMBULATORY_CARE_PROVIDER_SITE_OTHER): Payer: PPO | Admitting: Adult Health

## 2020-10-17 ENCOUNTER — Telehealth: Payer: Self-pay | Admitting: Internal Medicine

## 2020-10-17 ENCOUNTER — Encounter: Payer: Self-pay | Admitting: Adult Health

## 2020-10-17 ENCOUNTER — Other Ambulatory Visit: Payer: Self-pay

## 2020-10-17 VITALS — BP 128/76 | HR 73 | Temp 96.0°F | Resp 16 | Wt 178.0 lb

## 2020-10-17 DIAGNOSIS — W57XXXA Bitten or stung by nonvenomous insect and other nonvenomous arthropods, initial encounter: Secondary | ICD-10-CM | POA: Diagnosis not present

## 2020-10-17 DIAGNOSIS — L299 Pruritus, unspecified: Secondary | ICD-10-CM | POA: Diagnosis not present

## 2020-10-17 DIAGNOSIS — S70362A Insect bite (nonvenomous), left thigh, initial encounter: Secondary | ICD-10-CM | POA: Diagnosis not present

## 2020-10-17 LAB — CBC WITH DIFFERENTIAL/PLATELET
Basophils Absolute: 0 10*3/uL (ref 0.0–0.1)
Basophils Relative: 0.4 % (ref 0.0–3.0)
Eosinophils Absolute: 0.1 10*3/uL (ref 0.0–0.7)
Eosinophils Relative: 1.5 % (ref 0.0–5.0)
HCT: 40.8 % (ref 36.0–46.0)
Hemoglobin: 14.1 g/dL (ref 12.0–15.0)
Lymphocytes Relative: 24 % (ref 12.0–46.0)
Lymphs Abs: 1.6 10*3/uL (ref 0.7–4.0)
MCHC: 34.6 g/dL (ref 30.0–36.0)
MCV: 92.3 fl (ref 78.0–100.0)
Monocytes Absolute: 0.4 10*3/uL (ref 0.1–1.0)
Monocytes Relative: 6.1 % (ref 3.0–12.0)
Neutro Abs: 4.6 10*3/uL (ref 1.4–7.7)
Neutrophils Relative %: 68 % (ref 43.0–77.0)
Platelets: 165 10*3/uL (ref 150.0–400.0)
RBC: 4.42 Mil/uL (ref 3.87–5.11)
RDW: 13.1 % (ref 11.5–15.5)
WBC: 6.8 10*3/uL (ref 4.0–10.5)

## 2020-10-17 MED ORDER — DOXYCYCLINE HYCLATE 100 MG PO TABS: 100.0000 mg | ORAL_TABLET | Freq: Two times a day (BID) | ORAL | 0 refills | Status: AC

## 2020-10-17 MED ORDER — TRIAMCINOLONE ACETONIDE 0.5 % EX OINT: 1.0000 "application " | TOPICAL_OINTMENT | Freq: Two times a day (BID) | CUTANEOUS | 0 refills | Status: DC

## 2020-10-17 NOTE — Progress Notes (Signed)
Acute Office Visit  Subjective:    [name], female    DOB: [birth year], 70 y.o.   MRN: [id number]  Chief Complaint  Patient presents with  . Tick Removal    HPI Patient is in today for tick bite on 10/12/20 - tick was stuck and she removed from her left upper thigh.  She does have a red area still on her left upper thigh she reports that rash does itch she has been using some Bactroban cream on the area that she had at home. .  She does bring tick to office and it is small with small white dot on tick body.   She denies any neck pain, stiffness, lymphadenopathy.  Patient  denies any fever, body aches,chills, chest pain, shortness of breath, nausea, vomiting, or diarrhea.  Denies dizziness, lightheadedness, pre syncopal or syncopal episodes.   Past Medical History:  Diagnosis Date  . Colon polyps    hyperplastic  . Diabetes mellitus without complication ([place])   . Diverticulosis   . Hx of adenomatous colonic polyps    tubular adenoma  . Hypercholesterolemia   . Serrated adenoma of colon   . Stroke (cerebrum) ([place])   . Thrombocytopenia ([place])   . Uterus descensus     Past Surgical History:  Procedure Laterality Date  . ABDOMINAL HYSTERECTOMY  2009  . CHOLECYSTECTOMY  2007  . COLONOSCOPY N/A 04/05/2015   Procedure: COLONOSCOPY;  Surgeon: Lollie Sails, MD;  Location: [facility] ENDOSCOPY;  Service: Endoscopy;  Laterality: N/A;  . COLONOSCOPY WITH PROPOFOL N/A 10/09/2016   Procedure: COLONOSCOPY WITH PROPOFOL;  Surgeon: Lollie Sails, MD;  Location: [facility] ENDOSCOPY;  Service: Endoscopy;  Laterality: N/A;  . COLONOSCOPY WITH PROPOFOL N/A 12/02/2019   Procedure: COLONOSCOPY WITH PROPOFOL;  Surgeon: Toledo, Benay Pike, MD;  Location: [facility] ENDOSCOPY;  Service: Gastroenterology;  Laterality: N/A;  . DILATION AND CURETTAGE OF UTERUS  2001 and 2006  . FOOT SURGERY  2007  . TONSILLECTOMY  1960  . TUBAL LIGATION    . vocal cord cyst removed      Family  History  Problem Relation Age of Onset  . Diabetes Other   . Colon cancer Mother   . Diabetes Mother   . Kidney disease Mother   . Heart disease Mother   . Stroke Paternal Grandmother   . Stroke Paternal Grandfather   . Prostate cancer Father   . Diabetes Father   . Glaucoma Father   . Breast cancer Paternal Aunt        x2  . Uterine cancer Maternal Aunt   . Diabetes Brother   . Diabetes Brother     Social History   Socioeconomic History  . Marital status: Married    Spouse name: Not on file  . Number of children: Not on file  . Years of education: Not on file  . Highest education level: Not on file  Occupational History  . Not on file  Tobacco Use  . Smoking status: Never Smoker  . Smokeless tobacco: Never Used  Vaping Use  . Vaping Use: Never used  Substance and Sexual Activity  . Alcohol use: No    Alcohol/week: 0.0 standard drinks  . Drug use: No  . Sexual activity: Never  Other Topics Concern  . Not on file  Social History Narrative  . Not on file   Social Determinants of Health   Financial Resource Strain: Low Risk   . Difficulty of Paying Living Expenses:  Not hard at all  Food Insecurity: No Food Insecurity  . Worried About Charity fundraiser in the Last Year: Never true  . Ran Out of Food in the Last Year: Never true  Transportation Needs: No Transportation Needs  . Lack of Transportation (Medical): No  . Lack of Transportation (Non-Medical): No  Physical Activity: Insufficiently Active  . Days of Exercise per Week: 3 days  . Minutes of Exercise per Session: 20 min  Stress: No Stress Concern Present  . Feeling of Stress : Not at all  Social Connections: Unknown  . Frequency of Communication with Friends and Family: Not on file  . Frequency of Social Gatherings with Friends and Family: Not on file  . Attends Religious Services: Not on file  . Active Member of Clubs or Organizations: Not on file  . Attends Archivist Meetings: Not on  file  . Marital Status: Married  Human resources officer Violence: Not At Risk  . Fear of Current or Ex-Partner: No  . Emotionally Abused: No  . Physically Abused: No  . Sexually Abused: No    Outpatient Medications Prior to Visit  Medication Sig Dispense Refill  . aspirin 81 MG tablet Take 81 mg by mouth daily.    . Cholecalciferol 25 MCG (1000 UT) tablet Take 1,000 Units by mouth daily.     . colestipol (COLESTID) 1 g tablet Take 1 g by mouth daily.     Marland Kitchen CRANBERRY PO Take by mouth.    . dorzolamide-timolol (COSOPT) 22.3-6.8 MG/ML ophthalmic solution Place 1 drop into both eyes 2 (two) times daily.  5  . glucose blood (ONETOUCH VERIO) test strip USE AS INSTRUCTED TO CHECK BLOOD SUGARS DAILY. DX E11.9 100 strip 12  . metFORMIN (GLUCOPHAGE XR) 500 MG 24 hr tablet Take 1 tablet (500 mg total) by mouth daily with breakfast. 30 tablet 2  . Multiple Vitamins-Minerals (MULTIVITAMIN ADULT PO) Take by mouth daily.    . Omega-3 Fatty Acids (OMEGA-3 FISH OIL) 1200 MG CAPS Take 1,200 mg by mouth daily.     . Probiotic Product (PROBIOTIC DAILY) CAPS Take 1 capsule by mouth daily.    . rosuvastatin (CRESTOR) 5 MG tablet TAKE 1 TABLET BY MOUTH EVERY DAY 90 tablet 1  . TRAVATAN Z 0.004 % SOLN ophthalmic solution Place 1 drop into both eyes at bedtime.    Marland Kitchen amoxicillin-clavulanate (AUGMENTIN) 875-125 MG tablet Take 1 tablet by mouth 2 (two) times daily. With food 14 tablet 0   No facility-administered medications prior to visit.    Allergies  Allergen Reactions  . No Known Drug Allergy     Review of Systems  Constitutional: Negative.   HENT: Negative.   Respiratory: Negative.   Cardiovascular: Negative.   Gastrointestinal: Negative.   Genitourinary: Negative.   Musculoskeletal: Negative.   Skin: Positive for color change and rash.  Neurological: Negative.   Hematological: Negative for adenopathy.  Psychiatric/Behavioral: Negative.        Objective:    Physical Exam Constitutional:       General: She is not in acute distress.    Appearance: She is obese. She is not ill-appearing, toxic-appearing or diaphoretic.  HENT:     Head: Normocephalic and atraumatic.     Right Ear: External ear normal.     Left Ear: External ear normal.     Nose: Nose normal.     Mouth/Throat:     Pharynx: No oropharyngeal exudate.  Eyes:     General: No  scleral icterus.       Right eye: No discharge.        Left eye: No discharge.     Pupils: Pupils are equal, round, and reactive to light.  Cardiovascular:     Rate and Rhythm: Normal rate and regular rhythm.     Pulses: Normal pulses.     Heart sounds: Normal heart sounds. No murmur heard. No friction rub. No gallop.   Pulmonary:     Effort: Pulmonary effort is normal.  Abdominal:     General: Bowel sounds are normal. There is no distension.     Tenderness: There is no abdominal tenderness.  Musculoskeletal:        General: Normal range of motion.     Cervical back: Normal range of motion and neck supple. No rigidity or tenderness.  Lymphadenopathy:     Cervical: No cervical adenopathy.     Right cervical: No superficial, deep or posterior cervical adenopathy.    Left cervical: No superficial, deep or posterior cervical adenopathy.     Lower Body: No left inguinal adenopathy.  Skin:    Findings: Erythema and rash present.     Comments: See media attached of rash on left upper anterior thigh ( area of removed tick)  not warm to touch, no drainage, does have some central clearing concerning for erythema migrans. She denies any other rash. No other rash noted.    Neurological:     Mental Status: She is alert and oriented to person, place, and time.     Motor: No weakness.     Gait: Gait normal.  Psychiatric:        Mood and Affect: Mood normal.        Behavior: Behavior normal.        Thought Content: Thought content normal.        Judgment: Judgment normal.     Media Information office visit 10/17/20 media captured via [facility]  and placed under media of chart.      Document Information  Photos  Left upper thigh tick bite   10/17/2020 11:31  Attached To:  Office Visit on 10/17/20 with Doreen Beam, FNP   Source Information  Annice Jolly, Kelby Aline, FNP  [facility]    BP 128/76   Pulse 73   Temp (!) 96 F (35.6 C) (Temporal)   Resp 16   Wt 178 lb (80.7 kg)   SpO2 97%   BMI 29.62 kg/m  Wt Readings from Last 3 Encounters:  10/17/20 178 lb (80.7 kg)  09/23/20 177 lb (80.3 kg)  08/31/20 177 lb (80.3 kg)    Health Maintenance Due  Topic Date Due  . URINE MICROALBUMIN  12/13/2020    There are no preventive care reminders to display for this patient.   Lab Results  Component Value Date   TSH 1.74 08/29/2020   Lab Results  Component Value Date   WBC 5.1 12/14/2019   HGB 14.6 12/14/2019   HCT 42.0 12/14/2019   MCV 93.2 12/14/2019   PLT 152.0 12/14/2019   Lab Results  Component Value Date   NA 140 08/29/2020   K 3.9 08/29/2020   CO2 29 08/29/2020   GLUCOSE 164 (H) 08/29/2020   BUN 20 08/29/2020   CREATININE 0.67 08/29/2020   BILITOT 0.8 08/29/2020   ALKPHOS 63 08/29/2020   AST 24 08/29/2020   ALT 25 08/29/2020   PROT 6.4 08/29/2020   ALBUMIN 4.3 08/29/2020   CALCIUM 9.4 08/29/2020  ANIONGAP 11 06/23/2019   GFR 88.33 08/29/2020   Lab Results  Component Value Date   CHOL 161 08/29/2020   Lab Results  Component Value Date   HDL 62.20 08/29/2020   Lab Results  Component Value Date   LDLCALC 72 08/29/2020   Lab Results  Component Value Date   TRIG 132.0 08/29/2020   Lab Results  Component Value Date   CHOLHDL 3 08/29/2020   Lab Results  Component Value Date   HGBA1C 7.6 (H) 08/29/2020       Assessment & Plan:   Problem List Items Addressed This Visit      Musculoskeletal and Integument   Tick bite of left thigh - Primary   Relevant Medications   doxycycline (VIBRA-TABS) 100 MG tablet     Other   Itching   Relevant Medications    triamcinolone ointment (KENALOG) 0.5 %   Other Relevant Orders   CBC with Differential/Platelet     She is leaving for the beach today, she has no other symptoms besides rash concerning for erythema migrans and known tick removal. Area is itching. No residual tick was seen.  Too early check lyme antibodies.  Will start Doxycycline, as below and given topical steroid to use for itching as she does report having intense itching / scratching at site since tick removal.  Erythema Migrans versus localized infection.   Orders Placed This Encounter  Procedures  . CBC with Differential/Platelet  CBC today.    Meds ordered this encounter  Medications  . doxycycline (VIBRA-TABS) 100 MG tablet    Sig: Take 1 tablet (100 mg total) by mouth 2 (two) times daily for 14 days.    Dispense:  28 tablet    Refill:  0  . triamcinolone ointment (KENALOG) 0.5 %    Sig: Apply 1 application topically 2 (two) times daily. For itching.    Dispense:  30 g    Refill:  0   Discussed sun safety, taking with food, and not laying down flat within taking the DOXYCYCLINE. Signs and symptoms of Lyme's, RMSF, as well as alpha gal revived with the patient. After visit summary given.  Meds ordered this encounter  Medications  . doxycycline (VIBRA-TABS) 100 MG tablet    Sig: Take 1 tablet (100 mg total) by mouth 2 (two) times daily for 14 days.    Dispense:  28 tablet    Refill:  0  . triamcinolone ointment (KENALOG) 0.5 %    Sig: Apply 1 application topically 2 (two) times daily. For itching.    Dispense:  30 g    Refill:  0    Red Flags discussed. The patient was given clear instructions to go to ER or return to medical center if any red flags develop, symptoms do not improve, worsen or new problems develop. They verbalized understanding.  Recheck in office is advised as soon as she returns from beach and she is to return sooner if any worsening symptoms at anytime, she will be seen while out of town if rash  worsens  or any new symptoms appear.    Return in about 3 days (around 10/20/2020), or if symptoms worsen or fail to improve, for at any time for any worsening symptoms, Go to Emergency room/ urgent care if worse.  Marcille Buffy, FNP

## 2020-10-17 NOTE — Telephone Encounter (Signed)
Pt has been scheduled for an appt today at 11:30 Pt has a tick bite on her upper thigh. At the bite site it is purple and red the surrounding area is light pink and spreading

## 2020-10-17 NOTE — Telephone Encounter (Signed)
Noted  

## 2020-10-17 NOTE — Patient Instructions (Signed)
Tick Bite Information, Adult  Ticks are insects that can bite. Most ticks live in shrubs and grassy areas. They climb onto people and animals that go by. Then they bite. Some ticks carry germs that can make you sick. How can I prevent tick bites? Take these steps: Use insect repellent  Use an insect repellent that has 20% or higher of the ingredients DEET, picaridin, or IR3535. Follow the instructions on the label. Put it on: ? Bare skin. ? The tops of your boots. ? Your pant legs. ? The ends of your sleeves.  If you use an insect repellent that has the ingredient permethrin, follow the instructions on the label. Put it on: ? Clothing. ? Boots. ? Supplies or outdoor gear. ? Tents. When you are outside  Wear long sleeves and long pants.  Wear light-colored clothes.  Tuck your pant legs into your socks.  Stay in the middle of the trail. Do not touch the bushes.  Avoid walking through long grass.  Check for ticks on your clothes, hair, and skin often while you are outside. Before going inside your house, check your clothes, skin, head, neck, armpits, waist, groin, and joint areas. When you go indoors  Check your clothes for ticks. Dry your clothes in a dryer on high heat for 10 minutes or more. If clothes are damp, additional time may be needed.  Wash your clothes right away if they need to be washed. Use hot water.  Check your pets and outdoor gear.  Shower right away.  Check your body for ticks. Do a full body check using a mirror. What is the right way to remove a tick? Remove the tick from your skin as soon as possible. Do not remove the tick with your bare fingers.  To remove a tick that is crawling on your skin: ? Go outdoors and brush the tick off. ? Use tape or a lint roller.  To remove a tick that is biting: 1. Wash your hands. 2. If you have latex gloves, put them on. 3. Use tweezers, curved forceps, or a tick-removal tool to grasp the tick. Grasp the tick  as close to your skin and as close to the tick's head as possible. 4. Gently pull up until the tick lets go.  Try to keep the tick's head attached to its body.  Do not twist or jerk the tick.  Do not squeeze or crush the tick. Do not try to remove a tick with heat, alcohol, petroleum jelly, or fingernail polish.   What should I do after taking out a tick?  Throw away the tick. Do not crush a tick with your fingers.  Clean the bite area and your hands with soap and water, rubbing alcohol, or an iodine wash.  If an antiseptic cream or ointment is available, apply a small amount to the bite area.  Wash and disinfect any instruments that you used to remove the tick. How should I get rid of a live tick? To dispose of a live tick, use one of these methods:  Place the tick in rubbing alcohol.  Place the tick in a bag or container you can close tightly.  Wrap the tick tightly in tape.  Flush the tick down the toilet. Contact a doctor if:  You have symptoms, such as: ? A fever or chills. ? A red rash that makes a circle (bull's-eye rash) in the bite area. ? Redness and swelling where the tick bit you. ? Headache. ?  Pain in a muscle, joint, or bone. ? Being more tired than normal. ? Trouble walking or moving your legs. ? Numbness in your legs. ? Tender and swollen lymph glands.  A part of a tick breaks off and gets stuck in your skin. Get help right away if:  You cannot remove a tick.  You cannot move (have paralysis) or feel weak.  You are feeling worse or have new symptoms.  You find a tick that is biting you and filled with blood. This is important if you are in an area where diseases from ticks are common. Summary  Ticks may carry germs that can make you sick.  To prevent tick bites wear long sleeves, long pants, and light colors. Use insect repellent. Follow the instructions on the label.  If the tick is biting, do not try to remove it with heat, alcohol, petroleum  jelly, or fingernail polish.  Use tweezers, curved forceps, or a tick-removal tool to grasp the tick. Gently pull up until the tick lets go. Do not twist or jerk the tick. Do not squeeze or crush the tick.  If you have symptoms, contact a doctor. This information is not intended to replace advice given to you by your health care provider. Make sure you discuss any questions you have with your health care provider. Document Revised: 05/11/2019 Document Reviewed: 05/11/2019 Elsevier Patient Education  2021 [place]. Doxycycline tablets or capsules What is this medicine? DOXYCYCLINE (dox i SYE kleen) is a tetracycline antibiotic. It kills certain bacteria or stops their growth. It is used to treat many kinds of infections, like dental, skin, respiratory, and urinary tract infections. It also treats acne, Lyme disease, malaria, and certain sexually transmitted infections. This medicine may be used for other purposes; ask your health care provider or pharmacist if you have questions. COMMON BRAND NAME(S): Acticlate, Adoxa, Adoxa CK, Adoxa Pak, Adoxa TT, Alodox, Avidoxy, Doxal, LYMEPAK, Mondoxyne NL, Monodox, Morgidox 1x, Morgidox 1x Kit, Morgidox 2x, Morgidox 2x Kit, NutriDox, Ocudox, [place], [place], [place], Vibra-Tabs, Vibramycin What should I tell my health care provider before I take this medicine? They need to know if you have any of these conditions:  liver disease  long exposure to sunlight like working outdoors  stomach problems like colitis  an unusual or allergic reaction to doxycycline, tetracycline antibiotics, other medicines, foods, dyes, or preservatives  pregnant or trying to get pregnant  breast-feeding How should I use this medicine? Take this medicine by mouth with a full glass of water. Follow the directions on the prescription label. It is best to take this medicine without food, but if it upsets your stomach take it with food. Take your medicine at regular  intervals. Do not take your medicine more often than directed. Take all of your medicine as directed even if you think you are better. Do not skip doses or stop your medicine early. Talk to your pediatrician regarding the use of this medicine in children. While this drug may be prescribed for selected conditions, precautions do apply. Overdosage: If you think you have taken too much of this medicine contact a poison control center or emergency room at once. NOTE: This medicine is only for you. Do not share this medicine with others. What if I miss a dose? If you miss a dose, take it as soon as you can. If it is almost time for your next dose, take only that dose. Do not take double or extra doses. What may interact with this medicine?  antacids  barbiturates  birth control pills  bismuth subsalicylate  carbamazepine  methoxyflurane  other antibiotics  phenytoin  vitamins that contain iron  warfarin This list may not describe all possible interactions. Give your health care provider a list of all the medicines, herbs, non-prescription drugs, or dietary supplements you use. Also tell them if you smoke, drink alcohol, or use illegal drugs. Some items may interact with your medicine. What should I watch for while using this medicine? Tell your doctor or health care professional if your symptoms do not improve. Do not treat diarrhea with over the counter products. Contact your doctor if you have diarrhea that lasts more than 2 days or if it is severe and watery. Do not take this medicine just before going to bed. It may not dissolve properly when you lay down and can cause pain in your throat. Drink plenty of fluids while taking this medicine to also help reduce irritation in your throat. This medicine can make you more sensitive to the sun. Keep out of the sun. If you cannot avoid being in the sun, wear protective clothing and use sunscreen. Do not use sun lamps or tanning  beds/booths. Birth control pills may not work properly while you are taking this medicine. Talk to your doctor about using an extra method of birth control. If you are being treated for a sexually transmitted infection, avoid sexual contact until you have finished your treatment. Your sexual partner may also need treatment. Avoid antacids, aluminum, calcium, magnesium, and iron products for 4 hours before and 2 hours after taking a dose of this medicine. If you are using this medicine to prevent malaria, you should still protect yourself from contact with mosquitos. Stay in screened-in areas, use mosquito nets, keep your body covered, and use an insect repellent. What side effects may I notice from receiving this medicine? Side effects that you should report to your doctor or health care professional as soon as possible:  allergic reactions like skin rash, itching or hives, swelling of the face, lips, or tongue  difficulty breathing  fever  itching in the rectal or genital area  pain on swallowing  rash, fever, and swollen lymph nodes  redness, blistering, peeling or loosening of the skin, including inside the mouth  severe stomach pain or cramps  unusual bleeding or bruising  unusually weak or tired  yellowing of the eyes or skin Side effects that usually do not require medical attention (report to your doctor or health care professional if they continue or are bothersome):  diarrhea  loss of appetite  nausea, vomiting This list may not describe all possible side effects. Call your doctor for medical advice about side effects. You may report side effects to FDA at 1-800-FDA-1088. Where should I keep my medicine? Keep out of the reach of children. Store at room temperature, below 30 degrees C (86 degrees F). Protect from light. Keep container tightly closed. Throw away any unused medicine after the expiration date. Taking this medicine after the expiration date can make you  seriously ill. NOTE: This sheet is a summary. It may not cover all possible information. If you have questions about this medicine, talk to your doctor, pharmacist, or health care provider.  2021 Elsevier/Gold Standard (2018-08-14 13:44:53)

## 2020-10-17 NOTE — Progress Notes (Signed)
CBC is within normal limits please let patient know. Follow up as directed at office viist if any symptoms worsening or change at anytime.

## 2020-10-25 ENCOUNTER — Telehealth: Payer: Self-pay | Admitting: Internal Medicine

## 2020-10-25 DIAGNOSIS — H401111 Primary open-angle glaucoma, right eye, mild stage: Secondary | ICD-10-CM | POA: Diagnosis not present

## 2020-10-25 NOTE — Telephone Encounter (Signed)
Patient returned office phone for labs. Please call patient this afternoon, she has an eye appointment this morning. Please call patient's cell phone.

## 2020-10-26 NOTE — Telephone Encounter (Signed)
Letter has been sent in the mail.

## 2020-11-02 ENCOUNTER — Encounter: Payer: Self-pay | Admitting: *Deleted

## 2020-11-21 ENCOUNTER — Other Ambulatory Visit: Payer: Self-pay | Admitting: Internal Medicine

## 2020-11-29 ENCOUNTER — Other Ambulatory Visit: Payer: PPO

## 2020-12-01 ENCOUNTER — Ambulatory Visit: Payer: PPO | Admitting: Internal Medicine

## 2020-12-07 ENCOUNTER — Ambulatory Visit: Payer: PPO | Admitting: Internal Medicine

## 2020-12-13 ENCOUNTER — Other Ambulatory Visit (INDEPENDENT_AMBULATORY_CARE_PROVIDER_SITE_OTHER): Payer: PPO

## 2020-12-13 ENCOUNTER — Ambulatory Visit
Admission: RE | Admit: 2020-12-13 | Discharge: 2020-12-13 | Disposition: A | Discharge status: Home / Self Care | Payer: PPO | Source: Ambulatory Visit | Attending: Internal Medicine | Admitting: Internal Medicine

## 2020-12-13 ENCOUNTER — Other Ambulatory Visit: Payer: Self-pay

## 2020-12-13 DIAGNOSIS — D696 Thrombocytopenia, unspecified: Secondary | ICD-10-CM | POA: Diagnosis not present

## 2020-12-13 DIAGNOSIS — E7800 Pure hypercholesterolemia, unspecified: Secondary | ICD-10-CM | POA: Diagnosis not present

## 2020-12-13 DIAGNOSIS — E1159 Type 2 diabetes mellitus with other circulatory complications: Secondary | ICD-10-CM | POA: Diagnosis not present

## 2020-12-13 DIAGNOSIS — Z1231 Encounter for screening mammogram for malignant neoplasm of breast: Secondary | ICD-10-CM | POA: Diagnosis not present

## 2020-12-13 LAB — CBC WITH DIFFERENTIAL/PLATELET
Basophils Absolute: 0 10*3/uL (ref 0.0–0.1)
Basophils Relative: 0.3 % (ref 0.0–3.0)
Eosinophils Absolute: 0.1 10*3/uL (ref 0.0–0.7)
Eosinophils Relative: 1.6 % (ref 0.0–5.0)
HCT: 41.7 % (ref 36.0–46.0)
Hemoglobin: 14.2 g/dL (ref 12.0–15.0)
Lymphocytes Relative: 27 % (ref 12.0–46.0)
Lymphs Abs: 1.4 10*3/uL (ref 0.7–4.0)
MCHC: 34 g/dL (ref 30.0–36.0)
MCV: 93.8 fl (ref 78.0–100.0)
Monocytes Absolute: 0.4 10*3/uL (ref 0.1–1.0)
Monocytes Relative: 6.8 % (ref 3.0–12.0)
Neutro Abs: 3.4 10*3/uL (ref 1.4–7.7)
Neutrophils Relative %: 64.3 % (ref 43.0–77.0)
Platelets: 154 10*3/uL (ref 150.0–400.0)
RBC: 4.45 Mil/uL (ref 3.87–5.11)
RDW: 13.5 % (ref 11.5–15.5)
WBC: 5.3 10*3/uL (ref 4.0–10.5)

## 2020-12-13 LAB — LIPID PANEL
Cholesterol: 145 mg/dL (ref 0–200)
HDL: 59.5 mg/dL (ref >39.00)
LDL Cholesterol: 62 mg/dL (ref 0–99)
NonHDL: 85.6
Total CHOL/HDL Ratio: 2
Triglycerides: 119 mg/dL (ref 0.0–149.0)
VLDL: 23.8 mg/dL (ref 0.0–40.0)

## 2020-12-13 LAB — HEPATIC FUNCTION PANEL
ALT: 26 U/L (ref 0–35)
AST: 24 U/L (ref 0–37)
Albumin: 4.3 g/dL (ref 3.5–5.2)
Alkaline Phosphatase: 57 U/L (ref 39–117)
Bilirubin, Direct: 0.2 mg/dL (ref 0.0–0.3)
Total Bilirubin: 0.9 mg/dL (ref 0.2–1.2)
Total Protein: 6.1 g/dL (ref 6.0–8.3)

## 2020-12-13 LAB — BASIC METABOLIC PANEL
BUN: 20 mg/dL (ref 6–23)
CO2: 27 mEq/L (ref 19–32)
Calcium: 9.2 mg/dL (ref 8.4–10.5)
Chloride: 106 mEq/L (ref 96–112)
Creatinine, Ser: 0.67 mg/dL (ref 0.40–1.20)
GFR: 88.15 mL/min (ref >60.00)
Glucose, Bld: 162 mg/dL — ABNORMAL HIGH (ref 70–99)
Potassium: 4.1 mEq/L (ref 3.5–5.1)
Sodium: 141 mEq/L (ref 135–145)

## 2020-12-13 LAB — HEMOGLOBIN A1C: Hgb A1c MFr Bld: 7.3 % — ABNORMAL HIGH (ref 4.6–6.5)

## 2020-12-14 ENCOUNTER — Encounter: Payer: Self-pay | Admitting: Internal Medicine

## 2020-12-14 ENCOUNTER — Ambulatory Visit (INDEPENDENT_AMBULATORY_CARE_PROVIDER_SITE_OTHER): Payer: PPO | Admitting: Internal Medicine

## 2020-12-14 DIAGNOSIS — Z86010 Personal history of colonic polyps: Secondary | ICD-10-CM

## 2020-12-14 DIAGNOSIS — D696 Thrombocytopenia, unspecified: Secondary | ICD-10-CM

## 2020-12-14 DIAGNOSIS — E1159 Type 2 diabetes mellitus with other circulatory complications: Secondary | ICD-10-CM | POA: Diagnosis not present

## 2020-12-14 DIAGNOSIS — E7800 Pure hypercholesterolemia, unspecified: Secondary | ICD-10-CM

## 2020-12-14 DIAGNOSIS — Z8673 Personal history of transient ischemic attack (TIA), and cerebral infarction without residual deficits: Secondary | ICD-10-CM

## 2020-12-14 MED ORDER — METFORMIN HCL ER 500 MG PO TB24: 500.0000 mg | ORAL_TABLET | Freq: Two times a day (BID) | ORAL | 1 refills | Status: DC

## 2020-12-14 MED ORDER — ROSUVASTATIN CALCIUM 5 MG PO TABS: 5.0000 mg | ORAL_TABLET | Freq: Every day | ORAL | 1 refills | Status: DC

## 2020-12-14 NOTE — Progress Notes (Signed)
[name], female   DOB: 12-Dec-1950, 70 y.o.   MRN: [id number]   Subjective:    [name], female    DOB: 1951-05-15, 70 y.o.   MRN: [id number]  HPI This visit occurred during the SARS-CoV-2 public health emergency.  Safety protocols were in place, including screening questions prior to the visit, additional usage of staff PPE, and extensive cleaning of exam room while observing appropriate contact time as indicated for disinfecting solutions.   Patient here for a scheduled follow up.  Here to follow up regarding her blood sugar, blood pressure and cholesterol.  Reports feeling well.  Sugars - 140s-160s.  No chest pain or sob reported.  No abdominal pain.  Bowels moving.  Blood pressure doing well.  Discussed diet and exercise.  A1c 7.3.  decreased from last check.  Discussed medication.   Past Medical History:  Diagnosis Date   Colon polyps    hyperplastic   Diabetes mellitus without complication ([place])    Diverticulosis    Hx of adenomatous colonic polyps    tubular adenoma   Hypercholesterolemia    Serrated adenoma of colon    Stroke (cerebrum) ([place])    Thrombocytopenia ([place])    Uterus descensus    Past Surgical History:  Procedure Laterality Date   ABDOMINAL HYSTERECTOMY  2009   CHOLECYSTECTOMY  2007   COLONOSCOPY N/A 04/05/2015   Procedure: COLONOSCOPY;  Surgeon: Lollie Sails, MD;  Location: [facility] ENDOSCOPY;  Service: Endoscopy;  Laterality: N/A;   COLONOSCOPY WITH PROPOFOL N/A 10/09/2016   Procedure: COLONOSCOPY WITH PROPOFOL;  Surgeon: Lollie Sails, MD;  Location: [facility] ENDOSCOPY;  Service: Endoscopy;  Laterality: N/A;   COLONOSCOPY WITH PROPOFOL N/A 12/02/2019   Procedure: COLONOSCOPY WITH PROPOFOL;  Surgeon: Toledo, Benay Pike, MD;  Location: [facility] ENDOSCOPY;  Service: Gastroenterology;  Laterality: N/A;   DILATION AND CURETTAGE OF UTERUS  2001 and 2006   FOOT SURGERY  2007   TONSILLECTOMY  1960   TUBAL LIGATION      vocal cord cyst removed     Family History  Problem Relation Age of Onset   Colon cancer Mother    Diabetes Mother    Kidney disease Mother    Heart disease Mother    Prostate cancer Father    Diabetes Father    Glaucoma Father    Uterine cancer Maternal Aunt    Breast cancer Paternal Aunt        x2   Stroke Paternal Grandmother    Stroke Paternal Grandfather    Diabetes Brother    Diabetes Brother    Diabetes Other    Social History   Socioeconomic History   Marital status: Married    Spouse name: Not on file   Number of children: Not on file   Years of education: Not on file   Highest education level: Not on file  Occupational History   Not on file  Tobacco Use   Smoking status: Never   Smokeless tobacco: Never  Vaping Use   Vaping Use: Never used  Substance and Sexual Activity   Alcohol use: No    Alcohol/week: 0.0 standard drinks   Drug use: No   Sexual activity: Never  Other Topics Concern   Not on file  Social History Narrative   Not on file   Social Determinants of Health   Financial Resource Strain: Low Risk    Difficulty of Paying Living Expenses: Not hard at all  Food  Insecurity: No Food Insecurity   Worried About Charity fundraiser in the Last Year: Never true   Ran Out of Food in the Last Year: Never true  Transportation Needs: No Transportation Needs   Lack of Transportation (Medical): No   Lack of Transportation (Non-Medical): No  Physical Activity: Insufficiently Active   Days of Exercise per Week: 3 days   Minutes of Exercise per Session: 20 min  Stress: No Stress Concern Present   Feeling of Stress : Not at all  Social Connections: Unknown   Frequency of Communication with Friends and Family: Not on file   Frequency of Social Gatherings with Friends and Family: Not on file   Attends Religious Services: Not on file   Active Member of Clubs or Organizations: Not on file   Attends Archivist Meetings: Not on file   Marital  Status: Married    Review of Systems  Constitutional:  Negative for appetite change and unexpected weight change.  HENT:  Negative for congestion and sinus pressure.   Respiratory:  Negative for cough, chest tightness and shortness of breath.   Cardiovascular:  Negative for chest pain, palpitations and leg swelling.  Gastrointestinal:  Negative for abdominal pain, diarrhea, nausea and vomiting.  Genitourinary:  Negative for difficulty urinating and dysuria.  Musculoskeletal:  Negative for joint swelling and myalgias.  Skin:  Negative for color change and rash.  Neurological:  Negative for dizziness, light-headedness and headaches.  Psychiatric/Behavioral:  Negative for agitation and dysphoric mood.       Objective:    Physical Exam Vitals reviewed.  Constitutional:      General: She is not in acute distress.    Appearance: Normal appearance.  HENT:     Head: Normocephalic and atraumatic.     Right Ear: External ear normal.     Left Ear: External ear normal.  Eyes:     General: No scleral icterus.       Right eye: No discharge.        Left eye: No discharge.     Conjunctiva/sclera: Conjunctivae normal.  Neck:     Thyroid: No thyromegaly.  Cardiovascular:     Rate and Rhythm: Normal rate and regular rhythm.  Pulmonary:     Effort: No respiratory distress.     Breath sounds: Normal breath sounds. No wheezing.  Abdominal:     General: Bowel sounds are normal.     Palpations: Abdomen is soft.     Tenderness: There is no abdominal tenderness.  Musculoskeletal:        General: No swelling or tenderness.     Cervical back: Neck supple. No tenderness.  Lymphadenopathy:     Cervical: No cervical adenopathy.  Skin:    Findings: No erythema or rash.  Neurological:     Mental Status: She is alert.  Psychiatric:        Mood and Affect: Mood normal.        Behavior: Behavior normal.    BP 126/80   Pulse 68   Temp 98.2 F (36.8 C)   Ht 5\' 5"  (1.651 m)   Wt 178 lb 9.6 oz  (81 kg)   SpO2 93%   BMI 29.72 kg/m  Wt Readings from Last 3 Encounters:  12/14/20 178 lb 9.6 oz (81 kg)  10/17/20 178 lb (80.7 kg)  09/23/20 177 lb (80.3 kg)    Outpatient Encounter Medications as of 12/14/2020  Medication Sig   aspirin 81 MG tablet Take 81  mg by mouth daily.   Cholecalciferol 25 MCG (1000 UT) tablet Take 1,000 Units by mouth daily.    colestipol (COLESTID) 1 g tablet Take 1 g by mouth daily.    dorzolamide-timolol (COSOPT) 22.3-6.8 MG/ML ophthalmic solution Place 1 drop into both eyes 2 (two) times daily.   glucose blood (ONETOUCH VERIO) test strip USE AS INSTRUCTED TO CHECK BLOOD SUGARS DAILY. DX E11.9   Multiple Vitamins-Minerals (MULTIVITAMIN ADULT PO) Take by mouth daily.   Probiotic Product (PROBIOTIC DAILY) CAPS Take 1 capsule by mouth daily.   TRAVATAN Z 0.004 % SOLN ophthalmic solution Place 1 drop into both eyes at bedtime.   triamcinolone ointment (KENALOG) 0.5 % Apply 1 application topically 2 (two) times daily. For itching.   [DISCONTINUED] metFORMIN (GLUCOPHAGE-XR) 500 MG 24 hr tablet TAKE 1 TABLET BY MOUTH EVERY DAY WITH BREAKFAST   [DISCONTINUED] rosuvastatin (CRESTOR) 5 MG tablet TAKE 1 TABLET BY MOUTH EVERY DAY   metFORMIN (GLUCOPHAGE-XR) 500 MG 24 hr tablet Take 1 tablet (500 mg total) by mouth 2 (two) times daily before a meal.   rosuvastatin (CRESTOR) 5 MG tablet Take 1 tablet (5 mg total) by mouth daily.   [DISCONTINUED] CRANBERRY PO Take by mouth. (Patient not taking: Reported on 12/14/2020)   [DISCONTINUED] Omega-3 Fatty Acids (OMEGA-3 FISH OIL) 1200 MG CAPS Take 1,200 mg by mouth daily.  (Patient not taking: Reported on 12/14/2020)   No facility-administered encounter medications on file as of 12/14/2020.     Lab Results  Component Value Date   WBC 5.3 12/13/2020   HGB 14.2 12/13/2020   HCT 41.7 12/13/2020   PLT 154.0 12/13/2020   GLUCOSE 162 (H) 12/13/2020   CHOL 145 12/13/2020   TRIG 119.0 12/13/2020   HDL 59.50 12/13/2020   LDLCALC 62  12/13/2020   ALT 26 12/13/2020   AST 24 12/13/2020   NA 141 12/13/2020   K 4.1 12/13/2020   CL 106 12/13/2020   CREATININE 0.67 12/13/2020   BUN 20 12/13/2020   CO2 27 12/13/2020   TSH 1.74 08/29/2020   HGBA1C 7.3 (H) 12/13/2020   MICROALBUR <0.7 12/14/2019       Assessment & Plan:   Problem List Items Addressed This Visit     History of colonic polyps    Colonoscopy 11/2019 - diverticulosis.  Recommended f/u colonoscopy in 5 years.        History of CVA (cerebrovascular accident)    Continue aspirin.        Hypercholesteremia    Continue crestor.  Low cholesterol diet and exercise.  Follow lipid panel and liver function tests.        Relevant Medications   rosuvastatin (CRESTOR) 5 MG tablet   Other Relevant Orders   Hepatic function panel   Lipid panel   Thrombocytopenia (HCC)    Platelet count wnl.         Type 2 diabetes mellitus with vascular disease ([place])    Discussed diet and exercise.  a1c 7.3.  Has improved from previous check.  On metformin 500mg  q day.  Increase to bid.  Follow sugars.  Follow metabolic panel and M0N.        Relevant Medications   metFORMIN (GLUCOPHAGE-XR) 500 MG 24 hr tablet   rosuvastatin (CRESTOR) 5 MG tablet   Other Relevant Orders   Hemoglobin U2V   Basic metabolic panel   Microalbumin / creatinine urine ratio     Einar Pheasant, MD

## 2020-12-15 ENCOUNTER — Ambulatory Visit: Payer: PPO | Admitting: Podiatry

## 2020-12-15 ENCOUNTER — Other Ambulatory Visit: Payer: Self-pay

## 2020-12-15 ENCOUNTER — Encounter: Payer: Self-pay | Admitting: Podiatry

## 2020-12-15 DIAGNOSIS — D696 Thrombocytopenia, unspecified: Secondary | ICD-10-CM

## 2020-12-15 DIAGNOSIS — M2012 Hallux valgus (acquired), left foot: Secondary | ICD-10-CM | POA: Diagnosis not present

## 2020-12-15 DIAGNOSIS — B351 Tinea unguium: Secondary | ICD-10-CM

## 2020-12-15 DIAGNOSIS — E119 Type 2 diabetes mellitus without complications: Secondary | ICD-10-CM

## 2020-12-15 DIAGNOSIS — M79674 Pain in right toe(s): Secondary | ICD-10-CM

## 2020-12-15 NOTE — Progress Notes (Signed)
This patient returns to my office for at risk foot care.  This patient requires this care by a professional since this patient will be at risk due to having diabetes and throbocytopenia..    This patient is unable to cut nails herself since the patient cannot reach her nails.These nails are painful walking and wearing shoes.  This patient presents for at risk foot care today.  General Appearance  Alert, conversant and in no acute stress.  Vascular  Dorsalis pedis and posterior tibial  pulses are palpable  bilaterally.  Capillary return is within normal limits  bilaterally. Temperature is within normal limits  bilaterally.  Neurologic  Senn-Weinstein monofilament wire test within normal limits  bilaterally. Muscle power within normal limits bilaterally.  Nails Thick disfigured discolored nails with subungual debris  from hallux to fifth toes bilaterally. No evidence of bacterial infection or drainage bilaterally.  Orthopedic  No limitations of motion  feet .  No crepitus or effusions noted.  No bony pathology or digital deformities noted. Plantar flexed 2,3 right foot.  Skin  normotropic skin  noted bilaterally.  No signs of infections or ulcers noted.   Asymptomatic porokeratosis sub 2 right foot.  Onychomycosis  Pain in right toes  Pain in left toes  Consent was obtained for treatment procedures.   Mechanical debridement of nails 1-5  bilaterally performed with a nail nipper.  Filed with dremel without incident.    Return office visit  4 months                   Told patient to return for periodic foot care and evaluation due to potential at risk complications.   Shyla Gayheart DPM  

## 2020-12-22 ENCOUNTER — Encounter: Payer: Self-pay | Admitting: Internal Medicine

## 2020-12-22 NOTE — Assessment & Plan Note (Signed)
Continue aspirin 

## 2020-12-22 NOTE — Assessment & Plan Note (Signed)
Continue crestor.  Low cholesterol diet and exercise. Follow lipid panel and liver function tests.   

## 2020-12-22 NOTE — Assessment & Plan Note (Signed)
Discussed diet and exercise.  a1c 7.3.  Has improved from previous check.  On metformin '500mg'$  q day.  Increase to bid.  Follow sugars.  Follow metabolic panel and [id number].

## 2020-12-22 NOTE — Assessment & Plan Note (Signed)
Colonoscopy 11/2019 - diverticulosis.  Recommended f/u colonoscopy in 5 years.  

## 2020-12-22 NOTE — Assessment & Plan Note (Signed)
Platelet count wnl.

## 2021-01-16 ENCOUNTER — Telehealth: Payer: Self-pay

## 2021-01-16 NOTE — Progress Notes (Signed)
[facility] New Patient Evaluation and Consultation  Referring Provider: McLean-Scocuzza, Olivia Mackie * PCP: Einar Pheasant, MD Date of Service: 01/17/2021  SUBJECTIVE Chief Complaint: New Patient (Initial Visit)- prolapse  History of Present Illness: Victoria Harris is a 70 y.o. White or Caucasian female seen in consultation at the request of Dr. Terese Door for evaluation of prolapse.    Review of records from Dr Tyler Aas significant for: History of hysterectomy for prolapse. Now has prolapse and incontinence.  Has symptoms of UTI but urine culture on 09/22/20 negative.   Urinary Symptoms: Leaks urine with with movement to the bathroom Leaks 3-4 time(s) per day.  Pad use: 1-2 liners/ mini-pads per day.   She is bothered by her UI symptoms. Has been happening for several years.   Day time voids 10.  Nocturia: 2-3 times per night to void. Voiding dysfunction: she empties her bladder well.  does not use a catheter to empty bladder.  When urinating, she feels dribbling after finishing Drinks: 1 cup coffee in AM, otherwise water.   UTIs: 1 UTI's in the last year.   Denies history of blood in urine and kidney or bladder stones  Pelvic Organ Prolapse Symptoms:                  She Admits to a feeling of a bulge the vaginal area. It has been present for years.  She Admits to seeing a bulge.  This bulge is sometimes bothersome.  Bowel Symptom: Bowel movements: 3-4 time(s) per day Stool consistency: soft  or loose Straining: no.  Splinting: no.  Incomplete evacuation: no.  She Admits to accidental bowel leakage / fecal incontinence  Occurs: randomly  Consistency with leakage: liquid. Depends on certain things that she eats. Takes imodium occasionally. Has tried fiber supplementation.  Bowel regimen: Colestid- sees GI at [facility] Last colonoscopy: Date 11/2019, Results- repeat 5 years  Sexual Function Sexually active: no.   Pelvic Pain Denies  pelvic pain   Past Medical History:  Past Medical History:  Diagnosis Date   Colon polyps    hyperplastic   Diabetes mellitus without complication ([place])    Diverticulosis    Hx of adenomatous colonic polyps    tubular adenoma   Hypercholesterolemia    Serrated adenoma of colon    Stroke (cerebrum) ([place])    Thrombocytopenia ([place])    Uterus descensus      Past Surgical History:   Past Surgical History:  Procedure Laterality Date   ABDOMINAL HYSTERECTOMY  2009   CHOLECYSTECTOMY  2007   COLONOSCOPY N/A 04/05/2015   Procedure: COLONOSCOPY;  Surgeon: Lollie Sails, MD;  Location: [facility] ENDOSCOPY;  Service: Endoscopy;  Laterality: N/A;   COLONOSCOPY WITH PROPOFOL N/A 10/09/2016   Procedure: COLONOSCOPY WITH PROPOFOL;  Surgeon: Lollie Sails, MD;  Location: [facility] ENDOSCOPY;  Service: Endoscopy;  Laterality: N/A;   COLONOSCOPY WITH PROPOFOL N/A 12/02/2019   Procedure: COLONOSCOPY WITH PROPOFOL;  Surgeon: Toledo, Benay Pike, MD;  Location: [facility] ENDOSCOPY;  Service: Gastroenterology;  Laterality: N/A;   DILATION AND CURETTAGE OF UTERUS  2001 and 2006   FOOT SURGERY  2007   TONSILLECTOMY  1960   TUBAL LIGATION     vocal cord cyst removed       Past OB/GYN History: OB History  Gravida Para Term Preterm AB Living  4         3  SAB IAB Ectopic Multiple Live Births          4    #  Outcome Date GA Lbr Len/2nd Weight Sex Delivery Anes PTL Lv  4 Gravida           3 Gravida           2 Gravida           1 Gravida           SVD x4 S/p hysterectomy   Medications: She has a current medication list which includes the following prescription(s): mirabegron er, aspirin, cholecalciferol, colestipol, dorzolamide-timolol, onetouch verio, metformin, multiple vitamin, probiotic daily, rosuvastatin, travatan z, and triamcinolone ointment.   Allergies: Patient is allergic to no known drug allergy.   Social History:  Social History   Tobacco Use   Smoking status: Never   Smokeless  tobacco: Never  Vaping Use   Vaping Use: Never used  Substance Use Topics   Alcohol use: No    Alcohol/week: 0.0 standard drinks   Drug use: No    Relationship status: married   Family History:   Family History  Problem Relation Age of Onset   Colon cancer Mother    Diabetes Mother    Kidney disease Mother    Heart disease Mother    Prostate cancer Father    Diabetes Father    Glaucoma Father    Uterine cancer Maternal Aunt    Breast cancer Paternal Aunt        x2   Stroke Paternal Grandmother    Stroke Paternal Grandfather    Diabetes Brother    Diabetes Brother    Diabetes Other      Review of Systems: Review of Systems  Constitutional:  Negative for fever, malaise/fatigue and weight loss.  Respiratory:  Negative for cough, shortness of breath and wheezing.   Cardiovascular:  Negative for chest pain, palpitations and leg swelling.  Gastrointestinal:  Negative for abdominal pain and blood in stool.  Genitourinary:  Negative for dysuria.  Musculoskeletal:  Negative for myalgias.  Skin:  Negative for rash.  Neurological:  Negative for dizziness and headaches.  Endo/Heme/Allergies:  Does not bruise/bleed easily.  Psychiatric/Behavioral:  Negative for depression. The patient is not nervous/anxious.     OBJECTIVE Physical Exam: Vitals:   01/17/21 1052  BP: 125/86  Pulse: 66  Weight: 177 lb 4.8 oz (80.4 kg)  Height: '5\' 5"'$  (1.651 m)    Physical Exam Constitutional:      General: She is not in acute distress. Pulmonary:     Effort: Pulmonary effort is normal.  Abdominal:     General: There is no distension.     Palpations: Abdomen is soft.     Tenderness: There is no abdominal tenderness. There is no rebound.  Musculoskeletal:        General: No swelling. Normal range of motion.  Skin:    General: Skin is warm and dry.     Findings: No rash.  Neurological:     Mental Status: She is alert and oriented to person, place, and time.  Psychiatric:         Mood and Affect: Mood normal.        Behavior: Behavior normal.     GU / Detailed Urogynecologic Evaluation:  Pelvic Exam: Normal external female genitalia; Bartholin's and Skene's glands normal in appearance; urethral meatus normal in appearance, no urethral masses or discharge.   CST: negative  s/p hysterectomy: Speculum exam reveals normal vaginal mucosa with  atrophy and normal vaginal cuff.  Adnexa no mass, fullness, tenderness.     Pelvic floor  strength II/V, puborectalis III/V external anal sphincter II/V  Pelvic floor musculature: Right levator non-tender, Right obturator non-tender, Left levator non-tender, Left obturator non-tender  POP-Q:   POP-Q  3                                            Aa   6                                           Ba  6                                              C   6                                            Gh  3                                            Pb  7                                            tvl   -2                                            Ap  -2                                            Bp                                                 D     Rectal Exam:  Diminished sphincter tone, small distal rectocele, enterocoele present, no rectal masses  Post-Void Residual (PVR) by Bladder Scan: In order to evaluate bladder emptying, we discussed obtaining a postvoid residual and she agreed to this procedure.  Procedure: The ultrasound unit was placed on the patient's abdomen in the suprapubic region after the patient had voided. A PVR of 6 ml was obtained by bladder scan.  Laboratory Results: POC urine: small leukocytes, negative nitrites  ASSESSMENT AND PLAN Victoria Harris is a 70 y.o. with:  1. Urinary frequency   2. Overactive bladder   3. Incontinence of feces, unspecified fecal incontinence type   4. Vaginal vault prolapse after hysterectomy   5. Prolapse of anterior vaginal wall     OAB We discussed  the symptoms of overactive bladder (OAB), which include urinary urgency, urinary frequency, nocturia, with  or without urge incontinence.  While we do not know the exact etiology of OAB, several treatment options exist. We discussed management including behavioral therapy (decreasing bladder irritants, urge suppression strategies, timed voids, bladder retraining), physical therapy, medication; for refractory cases posterior tibial nerve stimulation, sacral neuromodulation, and intravesical botulinum toxin injection.  - She is interested in starting medication. Myrbetriq '25mg'$  prescribed.  For Beta-3 agonist medication, we discussed the potential side effect of elevated blood pressure which is more likely to occur in individuals with uncontrolled hypertension. - She would also like physical therapy. Referral placed to [facility] rehab  2. Fecal incontinence - Treatment options include anti-diarrhea medication (loperamide/ Imodium OTC or prescription lomotil), fiber supplements, physical therapy, and possible sacral neuromodulation or surgery.   - She will start with the imodium and physical therapy  3. Stage IV anterior, Stage I posterior, Stage IV apical prolapse -For treatment of pelvic organ prolapse, we discussed options for management including expectant management, conservative management, and surgical management, such as Kegels, a pessary, pelvic floor physical therapy, and specific surgical procedures. - She is potentially interested in a pessary but wants to focus on incontinence which is more bothersome for her at this time.   Return 6 weeks for follow up.   Jaquita Folds, MD   Medical Decision Making:  - Reviewed/ ordered a clinical laboratory test - Review and summation of prior records

## 2021-01-16 NOTE — Telephone Encounter (Signed)
Nidhi Meccia is a 70 y.o. female was called back re: New pt Pre appt call to collect history information. Pt verified using 2 identifiers. Confirmation that I am speaking with the correct person.  Chart was updated: -Allergy -Medication -Confirm pharmacy -OB history  Pt was notified to arrive 15 min early and we will need a urine sample when she arrives. Pt verbalized understanding.

## 2021-01-16 NOTE — Telephone Encounter (Signed)
Attempt made to contact Victoria Harris is a 70 y.o. female re: New pt Pre appt call to collect history information.  -Allergy -Medication -Confirm pharmacy -OB history   Pt was not available. LM on the VM for the patient to call back

## 2021-01-17 ENCOUNTER — Ambulatory Visit (INDEPENDENT_AMBULATORY_CARE_PROVIDER_SITE_OTHER): Payer: PPO | Admitting: Obstetrics and Gynecology

## 2021-01-17 ENCOUNTER — Other Ambulatory Visit: Payer: Self-pay

## 2021-01-17 ENCOUNTER — Encounter: Payer: Self-pay | Admitting: Obstetrics and Gynecology

## 2021-01-17 VITALS — BP 125/86 | HR 66 | Ht 65.0 in | Wt 177.3 lb

## 2021-01-17 DIAGNOSIS — R350 Frequency of micturition: Secondary | ICD-10-CM

## 2021-01-17 DIAGNOSIS — N8110 Cystocele, unspecified: Secondary | ICD-10-CM | POA: Diagnosis not present

## 2021-01-17 DIAGNOSIS — N993 Prolapse of vaginal vault after hysterectomy: Secondary | ICD-10-CM

## 2021-01-17 DIAGNOSIS — N3281 Overactive bladder: Secondary | ICD-10-CM | POA: Diagnosis not present

## 2021-01-17 DIAGNOSIS — R159 Full incontinence of feces: Secondary | ICD-10-CM

## 2021-01-17 LAB — POCT URINALYSIS DIPSTICK
Appearance: ABNORMAL
Bilirubin, UA: NEGATIVE
Blood, UA: NEGATIVE
Glucose, UA: NEGATIVE
Ketones, UA: NEGATIVE
Nitrite, UA: NEGATIVE
Protein, UA: NEGATIVE
Spec Grav, UA: >=1.03 — AB (ref 1.010–1.025)
Urobilinogen, UA: 0.2 E.U./dL
pH, UA: 5.5 (ref 5.0–8.0)

## 2021-01-17 MED ORDER — MIRABEGRON ER 25 MG PO TB24: 25.0000 mg | ORAL_TABLET | Freq: Every day | ORAL | 5 refills | Status: DC

## 2021-01-17 NOTE — Patient Instructions (Addendum)
Accidental Bowel Leakage: Our goal is to achieve formed bowel movements daily or every-other-day without leakage.  You may need to try different combinations of the following options to find what works best for you.  Some management options include: Dietary changes (more leafy greens, vegetables and fruits; less processed foods) Fiber supplementation (Metamucil or something with psyllium as active ingredient) Over-the-counter imodium (tablets or liquid) to help solidify the stool and prevent leakage of stool.  You have a stage 4 (out of 4) prolapse.  We discussed the fact that it is not life threatening but there are several treatment options. For treatment of pelvic organ prolapse, we discussed options for management including expectant management, conservative management, and surgical management, such as Kegels, a pessary, pelvic floor physical therapy, and specific surgical procedures.    We discussed the symptoms of overactive bladder (OAB), which include urinary urgency, urinary frequency, night-time urination, with or without urge incontinence.  We discussed management including behavioral therapy (decreasing bladder irritants by following a bladder diet, urge suppression strategies, timed voids, bladder retraining), physical therapy, medication.

## 2021-02-07 ENCOUNTER — Ambulatory Visit: Payer: PPO | Admitting: Physical Therapy

## 2021-02-08 ENCOUNTER — Ambulatory Visit: Payer: PPO | Admitting: Obstetrics and Gynecology

## 2021-02-13 ENCOUNTER — Other Ambulatory Visit: Payer: Self-pay | Admitting: Internal Medicine

## 2021-02-14 ENCOUNTER — Other Ambulatory Visit: Payer: Self-pay

## 2021-02-14 ENCOUNTER — Ambulatory Visit: Payer: PPO | Attending: Obstetrics and Gynecology | Admitting: Physical Therapy

## 2021-02-14 ENCOUNTER — Encounter: Payer: Self-pay | Admitting: Physical Therapy

## 2021-02-14 DIAGNOSIS — R293 Abnormal posture: Secondary | ICD-10-CM | POA: Diagnosis not present

## 2021-02-14 DIAGNOSIS — R279 Unspecified lack of coordination: Secondary | ICD-10-CM | POA: Insufficient documentation

## 2021-02-14 DIAGNOSIS — M6281 Muscle weakness (generalized): Secondary | ICD-10-CM | POA: Insufficient documentation

## 2021-02-14 NOTE — Therapy (Signed)
[facility] [address] [place], [place], [address] Phone: [phone]   Fax:  [phone]  Physical Therapy Evaluation  Patient Details  Name: Victoria Harris MRN: [id number] Date of Birth: 04-28-1951 Referring Provider (PT): Sherlene Shams  Encounter Date: 02/14/2021   PT End of Session - 02/14/21 1014     Visit Number 1    Number of Visits 12    Date for PT Re-Evaluation 05/09/21    Authorization Type IE: 02/14/2021    PT Start Time 1030    PT Stop Time 1115    PT Time Calculation (min) 45 min    Activity Tolerance Patient tolerated treatment well    Behavior During Therapy [facility] for tasks assessed/performed             Past Medical History:  Diagnosis Date   Colon polyps    hyperplastic   Diabetes mellitus without complication ([place])    Diverticulosis    Hx of adenomatous colonic polyps    tubular adenoma   Hypercholesterolemia    Serrated adenoma of colon    Stroke (cerebrum) ([place])    Thrombocytopenia ([place])    Uterus descensus     Past Surgical History:  Procedure Laterality Date   ABDOMINAL HYSTERECTOMY  2009   CHOLECYSTECTOMY  2007   COLONOSCOPY N/A 04/05/2015   Procedure: COLONOSCOPY;  Surgeon: Lollie Sails, MD;  Location: [facility] ENDOSCOPY;  Service: Endoscopy;  Laterality: N/A;   COLONOSCOPY WITH PROPOFOL N/A 10/09/2016   Procedure: COLONOSCOPY WITH PROPOFOL;  Surgeon: Lollie Sails, MD;  Location: [facility] ENDOSCOPY;  Service: Endoscopy;  Laterality: N/A;   COLONOSCOPY WITH PROPOFOL N/A 12/02/2019   Procedure: COLONOSCOPY WITH PROPOFOL;  Surgeon: Toledo, Benay Pike, MD;  Location: [facility] ENDOSCOPY;  Service: Gastroenterology;  Laterality: N/A;   DILATION AND CURETTAGE OF UTERUS  2001 and 2006   FOOT SURGERY  2007   TONSILLECTOMY  1960   TUBAL LIGATION     vocal cord cyst removed      There were no vitals filed for this visit.  PELVIC FLOOR PHYSICAL THERAPY EVALUATION   SCREENING Red  Flags: none Have you had any night sweats? Unexplained weight loss? Saddle anesthesia? Unexplained changes in bowel or bladder habits?   Mental Status Patient is oriented to person, place and time.  Recent memory is intact.  Remote memory is intact.  Attention span and concentration are intact.  Expressive speech is intact.  Patient's fund of knowledge is within normal limits for educational level.  POSTURE/OBSERVATIONS:  Rounded shoulders in sitting with crossed leg and increased adduction of B hips    SUBJECTIVE  Chief Complaint: Patient went to Dr. Wannetta Sender and was told she had stage IV anterior, stage IV apical, and stage I posterior prolapse. Patient is on Metformin and has found she has more problems bowel incontinence. Patient reports family members have also had problems with Metformin and their bowel movements. Patient also has increased urinary urge in the night that results in urinary incontinence because she cannot make it to the bathroom in time. Patient has attempted walking for physical activity but patient reports barely getting down the road and having to call her husband because she's had a fecal incontinence episode. Patient has tried Colace, Immodium, and increased fiber content but has not found anything to help the fecal incontinence. Patient has had gallbladder removed and is aware of foods that can upset her stomach. Patient has the sensation for a BM but unable  to make it to the bathroom. Patient reports having to carry extra undergarments and pads in her purse because the fecal incontinence could happen at any moment of the day whether she is out grocery shopping or in her home. Patient will begin to have fecal incontinence episodes as soon as she begins to pull her pants down or as soon as she gets out the car to rush to the bathroom.   Pertinent History:  Falls: negative Pulmonary disease/dysfunction: negative  Surgical history: Positive for see above    Obstetrical History: G4P4 Deliveries: vaginal, (10 lb, 9 lb 14 oz, 11 lb, 7 lb) Tearing/Episiotomy: P1, patient cannot recall how much tearing   Gynecological History: Hysterectomy: yes, abdominal  Pelvic Organ Prolapse: positive, stage IV anteior, stage I posterior, stage IV apical prolapse  Pain with exam: no Heaviness/pressure: yes, especially at the end of the day if she has been lifting and working in the garden    Urinary History: Incontinence: positive Onset: several years Triggers: being close to a bathroom, getting out of the car, when the urge arises Amount: Min Protective undergarments: yes, StayFree Light  Number used/day: 1x/day, 2x/day at most  Nocturia: 3x/night Frequency of urination: will assess next session Toileting posture: feet flat Pain with urination: negative  Difficulty initiating urination: negative  Intermittent stream: positive Frequent UTI: negative  "Just in case": yes   Gastrointestinal History: [place] Stool Chart: Type 4 occasionally, mostly Type 6, slimy-like Frequency of BMs: 4x/day Toileting posture: feet flat Straining: negative Incontinence: positive, changing pads 1x/day, some days 3x/day   Sexual activity/pain: patient does not have any concerns  Location of pain: patient does not have concerns of pain  Current activities:  Shopping "town day", church, gardening, cooking, walking  Patient Goals:  More control of the bowels, feeling less embarrassed, control of bladder leaks     TREATMENT  Pre-treatment assessment: RANGE OF MOTION: Deferred 2/2 to time constraints   LEFT RIGHT  Lumbar forward flexion (65):      Lumbar extension (30):     Lumbar lateral flexion (25):     Thoracic and Lumbar rotation (30 degrees):       Hip Flexion (0-125):      Hip IR (0-45):     Hip ER (0-45):     Hip Abduction (0-40):     Hip extension (0-15):      Noted discrepancies between AROM and PROM:   SENSATION: Deferred 2/2 to time  constraints Grossly intact to light touch bilateral LEs as determined by testing dermatomes L2-S2  STRENGTH: MMT Deferred 2/2 to time constraints  RLE LLE  Hip Flexion    Hip Extension    Hip Abduction     Hip Adduction     Hip ER     Hip IR     Knee Extension    Knee Flexion    Dorsiflexion     Plantarflexion (seated)     ABDOMINAL: Deferred 2/2 to time constraints Diastasis:  Scar mobility: present/mobile perpendicular, parallel Rib flare: present/absent  SPECIAL TESTS: Deferred 2/2 to time constraints Slump (SN 83, -LR 0.32): R: negative positive L: negative positive  SLR (SN 92, -LR 0.29): R: negative positive L: negative positive  FABER (SN 81): R: negative positive L: negative positive FADIR (SN 94): R: negative positive L: negative positive  Distraction (GY69): R: negative positive L: negative positive  Compression (SN/SP 69): R: negative positive L: negative positive  Stork/March (SP 93): R: negative positive L: negative positive  EXTERNAL PELVIC EXAM: Deferred 2/2 to time constraints, Patient educated on the purpose of the pelvic exam and articulated understanding; patient consented to the exam verbally. Palpation: Breath coordination: present/absent/inconsistent Voluntary Contraction: present/absent Relaxation: full/delayed/non-relaxing Perineal movement with sustained IAP increase ("bear down"): descent/no change/elevation/excessive descent Perineal movement with rapid IAP increase ("cough"): elevation/no change/descent  INTERNAL VAGINAL EXAM: Deferred 2/2 to time constraints Introitus Appears:  Skin integrity:  Scar mobility: Strength (PERF):  Symmetry: Palpation: Prolapse:  ASSESSMENT Patient is a 70 year old presenting to clinic with chief complaints of urinary and bowel incontinence. Today's evaluation suggest deficits in PFM coordination, PFM strength, pelvic organ support, posture, IAP management as evidenced by stage IV anterior, stage IV apical, and  stage I posterior prolapse, nocturia (3x/night), heaviness and pressure at the vaginal opening, shifting toileting positions to fully empty bladder, bowel leakage with changing pads up to 3x/day, and urinary leakage. Patient's responses on FOTO-PFDI Bowel (38), FOTO-Urinary Problems (48), FOTO-PFDI Urinary (33) outcomes measures indicate increase in functional limitations/disability/distress. Patient's progress may be limited due to secondary comorbidities and stage of prolapse; however, patient's motivation is advantageous. Patient was able to achieve basic understanding of PFM in bowel/bladder and posture during today's evaluation and responded positively to educational interventions. Patient will benefit from continued skilled therapeutic intervention to address deficits in PFM coordination, PFM strength, pelvic organ support, posture, and IAP management in order to improve function, and increase overall QOL.  EDUCATION Patient educated on what to expect during course of physical therapy, POC, and provided with fiber intake handout and protection pads for bowel leakage. Patient verbalized understanding and returned demonstration. Patient will benefit from further education in order to maximize compliance and understanding to achieve established long-term goals.      Objective measurements completed on examination: See above findings.     PT Long Term Goals - 02/14/21 1319       PT LONG TERM GOAL #1   Title Patient will report decreased reliance on protective undergarments as indicated by < 3/24 hour period to demonstrate improved bowel control and allow for increased participation in activities including, but not limited to: walking, grocery shopping, and gardening.    Baseline IE: 3x/day or more    Time 12    Period Weeks    Status New    Target Date 05/09/21      PT LONG TERM GOAL #2   Title Patient will report less than/equal to 2x/night of emptying bladder with toileting strategies  before bed in order to reduce disruption of sleep and improve overall sleep quality.    Baseline IE: 3x/night    Time 12    Period Weeks    Status New    Target Date 05/09/21      PT LONG TERM GOAL #3   Title Patient will demonstrate circumferential and sequential contraction of >4/5 MMT, > 6 sec hold x10 and 5 consecutive quick flicks with </= 10 min rest between testing bouts, and relaxation of the PFM coordinated with breath for improved management of intra-abdominal pressure and normal bowel and bladder function without the presence of pain nor incontinence in order to improve participation at home and in the community.    Baseline IE: will assess next visit    Time 12    Period Weeks    Status New    Target Date 05/09/21      PT LONG TERM GOAL #4   Title Patient will be able to achieve a score of >/=58 on  the FOTO-Urinary Problem outcome measure to demonstrate a clinically significant change in symptoms and improvement of overall QOL.    Baseline IE: 48    Time 12    Period Weeks    Status New    Target Date 05/09/21      PT LONG TERM GOAL #5   Title Patient will be able to demonstrate having quite a bit or moderate bladder control confidence  in order to reduce urinary incontinence episodes inside and outside the home.    Baseline IE: little confidence    Time 12    Period Weeks    Status New    Target Date 05/09/21               Plan - 02/14/21 1015     Clinical Impression Statement Patient is a 70 year old presenting to clinic with chief complaints of urinary and bowel incontinence. Today's evaluation suggest deficits in PFM coordination, PFM strength, pelvic organ support, posture, IAP management as evidenced by stage IV anterior, stage IV apical, and stage I posterior prolapse, nocturia (3x/night), heaviness and pressure at the vaginal opening, shifting toileting positions to fully empty bladder, bowel leakage with changing pads up to 3x/day, and urinary leakage.  Patient's responses on FOTO-PFDI Bowel (38), FOTO-Urinary Problems (48), FOTO-PFDI Urinary (33) outcomes measures indicate increase in functional limitations/disability/distress. Patient's progress may be limited due to secondary comorbidities and stage of prolapse; however, patient's motivation is advantageous. Patient was able to achieve basic understanding of PFM in bowel/bladder and posture during today's evaluation and responded positively to educational interventions. Patient will benefit from continued skilled therapeutic intervention to address deficits in PFM coordination, PFM strength, pelvic organ support, posture, and IAP management in order to improve function, and increase overall QOL.    Personal Factors and Comorbidities Age;Comorbidity 3+;Past/Current Experience;Time since onset of injury/illness/exacerbation    Comorbidities DM, diverticulosis, colonic polyps, hypercholesterolemia, stroke, thrombocytopenia    Examination-Activity Limitations Bed Mobility;Hygiene/Grooming;Bend;Lift;Squat;Locomotion Level;Stand;Continence;Toileting    Examination-Participation Restrictions Interpersonal Relationship;Church;Cleaning;Laundry;Yard Work;Community Activity;Shop    Stability/Clinical Decision Making Evolving/Moderate complexity    Clinical Decision Making Moderate    Rehab Potential Fair    PT Frequency 1x / week    PT Duration 12 weeks    PT Treatment/Interventions ADLs/Self Care Home Management;Cryotherapy;Electrical Stimulation;Biofeedback;Moist Heat;DME Instruction;Gait training;Stair training;Functional mobility training;Therapeutic activities;Therapeutic exercise;Balance training;Neuromuscular re-education;Patient/family education;Cognitive remediation;Orthotic Fit/Training;Manual techniques;Scar mobilization;Taping;Compression bandaging;Spinal Manipulations;Joint Manipulations    PT Next Visit Plan physical assessment    PT Home Exercise Plan fiber intake handout, fecal incontinence  padding    Consulted and Agree with Plan of Care Patient             Patient will benefit from skilled therapeutic intervention in order to improve the following deficits and impairments:  Decreased endurance, Increased muscle spasms, Decreased scar mobility, Improper body mechanics, Decreased activity tolerance, Decreased coordination, Decreased strength, Postural dysfunction, Decreased mobility, Decreased skin integrity, Hypomobility  Visit Diagnosis: Muscle weakness (generalized)  Unspecified lack of coordination  Posture imbalance     Problem List Patient Active Problem List   Diagnosis Date Noted   Tick bite of left thigh 10/17/2020   Itching 10/17/2020   Prolapse of female bladder, acquired 09/23/2020   Loose stools 04/30/2020   History of COVID-19 06/28/2019   Cough 09/02/2017   Glaucoma 03/25/2016   Type 2 diabetes mellitus with vascular disease ([place]) 11/27/2015   History of colonic polyps 04/07/2015   History of CVA (cerebrovascular accident) 08/02/2014   Stress 08/02/2014   Plantar  fasciitis 07/05/2014   BMI 30.0-30.9,adult 07/05/2014   Health care maintenance 07/05/2014   Breast pain, right 05/25/2014   Pain of left heel 01/03/2014   Pain in soft tissues of limb 01/03/2014   Foot fracture, left 07/07/2013   Thrombocytopenia ([place]) 03/26/2012   Hypercholesteremia 03/26/2012   Brittini Brubeck, SPT  This entire session was performed under direct supervision and direction of a licensed Chiropractor . I have personally read, edited and approve of the note as written. Myles Gip PT, DPT [id number]  02/14/2021, 5:36 PM  [facility] [address] [place], [place], [address] Phone: [phone]   Fax:  [phone]  Name: Victoria Harris MRN: [id number] Date of Birth: 20-Aug-1950

## 2021-02-21 ENCOUNTER — Ambulatory Visit: Payer: PPO | Admitting: Physical Therapy

## 2021-02-21 ENCOUNTER — Telehealth: Payer: Self-pay | Admitting: Internal Medicine

## 2021-02-21 ENCOUNTER — Other Ambulatory Visit: Payer: Self-pay

## 2021-02-21 ENCOUNTER — Encounter: Payer: Self-pay | Admitting: Physical Therapy

## 2021-02-21 DIAGNOSIS — M6281 Muscle weakness (generalized): Secondary | ICD-10-CM | POA: Diagnosis not present

## 2021-02-21 DIAGNOSIS — R293 Abnormal posture: Secondary | ICD-10-CM

## 2021-02-21 DIAGNOSIS — R279 Unspecified lack of coordination: Secondary | ICD-10-CM

## 2021-02-21 NOTE — Telephone Encounter (Signed)
Patient is aware of below. She will hold metformin and let me know if diarrhea resolves.

## 2021-02-21 NOTE — Telephone Encounter (Signed)
Patient is taking the metformin and is causing increased diarrhea after her taking her second pill.Please advise.

## 2021-02-21 NOTE — Telephone Encounter (Signed)
Patient is not tolerating increasing her metormin to bid. Do you want me to see if she would be agreeable to see Catie to discuss other treatment options or is there something else you would like to try?

## 2021-02-21 NOTE — Telephone Encounter (Signed)
How are sugars doing?  Have her hold metformin today and tomorrow.  Call us with update of symptoms.  If diarrhea resolves, can restart extended release metformin q day.  If persistent problems, will need to be evaluated.

## 2021-02-21 NOTE — Therapy (Signed)
[facility] [address] [place], [place], [address] Phone: [phone]   Fax:  [phone]  Physical Therapy Treatment  Patient Details  Name: Victoria Harris MRN: [id number] Date of Birth: November 25, 1950 Referring Provider (PT): Sherlene Shams   Encounter Date: 02/21/2021   PT End of Session - 02/21/21 1016     Visit Number 2    Number of Visits 12    Date for PT Re-Evaluation 05/09/21    Authorization Type IE: 02/14/2021    PT Start Time 1017    PT Stop Time 1110    PT Time Calculation (min) 53 min    Activity Tolerance Patient tolerated treatment well    Behavior During Therapy [facility] for tasks assessed/performed             Past Medical History:  Diagnosis Date   Colon polyps    hyperplastic   Diabetes mellitus without complication ([place])    Diverticulosis    Hx of adenomatous colonic polyps    tubular adenoma   Hypercholesterolemia    Serrated adenoma of colon    Stroke (cerebrum) ([place])    Thrombocytopenia ([place])    Uterus descensus     Past Surgical History:  Procedure Laterality Date   ABDOMINAL HYSTERECTOMY  2009   CHOLECYSTECTOMY  2007   COLONOSCOPY N/A 04/05/2015   Procedure: COLONOSCOPY;  Surgeon: Lollie Sails, MD;  Location: [facility] ENDOSCOPY;  Service: Endoscopy;  Laterality: N/A;   COLONOSCOPY WITH PROPOFOL N/A 10/09/2016   Procedure: COLONOSCOPY WITH PROPOFOL;  Surgeon: Lollie Sails, MD;  Location[facility] ENDOSCOPY;  Service: Endoscopy;  Laterality: N/A;   COLONOSCOPY WITH PROPOFOL N/A 12/02/2019   Procedure: COLONOSCOPY WITH PROPOFOL;  Surgeon: Toledo, Benay Pike, MD;  Location: [facility] ENDOSCOPY;  Service: Gastroenterology;  Laterality: N/A;   DILATION AND CURETTAGE OF UTERUS  2001 and 2006   FOOT SURGERY  2007   TONSILLECTOMY  1960   TUBAL LIGATION     vocal cord cyst removed      There were no vitals filed for this visit.   Subjective Assessment - 02/21/21 1016     Subjective Patient  reports no new changes from initial evaluation. Patient states not having an appointment with PCP until November but will hopefully call soon because she believes the Metformin is causing the bowel leakage. Patient did purchase B-sure absorbency pads and could only find the light ones. Patient had a funeral to attend yesterday and had to change the pad 3x. Patient has had more instances of bowel leakage in the morning which she states is not common.    Currently in Pain? No/denies             TREATMENT   Pre-treatment assessment:  POSTURE/OBSERVATIONS:  Rounded shoulders in sitting with crossed leg and increased adduction of B hips  Iliac crest height: increased height R IC  RANGE OF MOTION:      LEFT RIGHT  Lumbar forward flexion (65):   WNL      Lumbar extension (30):  WNL      Lumbar lateral flexion (25):    WNL  WNL  Thoracic and Lumbar rotation (30 degrees):      WNL  WNL  Hip Flexion (0-125):     WNL WNL  Hip IR (0-45):   WNL WNL  Hip ER (0-45):   WNL WNL  Hip Abduction (0-40):   WNL WNL  Hip extension (0-15):   WNL WNL  SENSATION: Grossly intact to light touch bilateral LEs as determined by testing dermatomes L2-S2   STRENGTH: MMT    RLE LLE  Hip Flexion  4  4  Hip Extension  5  5  Hip Abduction   5  5  Hip Adduction   5  5  Hip ER   5  5  Hip IR   5  5  Knee Extension  5  5  Knee Flexion  5  5  Dorsiflexion   5  5  Plantarflexion (seated)  5  5    ABDOMINAL:  Diastasis: 2.5 fingers above, 3 fingers at umbilicus, 2 fingers below umbilicus with increased breath holding  Rib flare: absent    SPECIAL TESTS:  SLR (SN 92, -LR 0.29): R: negative L: negative  FABER (SN 81): R: negative L: negative  FADIR (SN 94): R: negative L: negative    EXTERNAL PELVIC EXAM: Deferred 2/2 time constraints, Patient educated on the purpose of the pelvic exam and articulated understanding; patient consented to the exam verbally. Palpation: Breath coordination:  present/absent/inconsistent Voluntary Contraction: present/absent Relaxation: full/delayed/non-relaxing Perineal movement with sustained IAP increase ("bear down"): descent/no change/elevation/excessive descent Perineal movement with rapid IAP increase ("cough"): elevation/no change/descent   Neuromuscular Re-education:  Donning/doffing of L heel lift to ensure proper alignment of the pelvis  Supine hooklying diaphragmatic breathing with VCs and TCs for improved IAP management and decreased pressure on the PFM   Patient educated throughout session on appropriate technique and form using multi-modal cueing, HEP, and activity modification. Patient articulated understanding and returned demonstration.  Patient response to interventions:  Patient stated she was going to call her doctor regarding the increase in bowel symptoms that could be caused by the Metformin. Patient felt that the diaphragmatic breathing took a lot of concentration.   ASSESSMENT Patient presents to clinic with excellent motivation to participate in today's session. Upon physical assessment, patient has deficits in PFM coordination, PFM strength, pelvic organ support, posture, and IAP management as evidenced by minimal diastasis above the umbilicus (2.5 fingers), increased R IC height, and increased breath holding and rectus abdominus mm compensation with diaphragmatic breathing. Patient was able to achieve basic understanding of breathing mechanics and coordination of the PFM with minimal cueing and responded positively to active and educational interventions. Patient will benefit from continued skilled therapeutic intervention to address deficits in PFM coordination, PFM strength, pelvic organ support, posture, and IAP management in order to improve function, and increase overall QOL.         PT Long Term Goals - 02/14/21 1319       PT LONG TERM GOAL #1   Title Patient will report decreased reliance on protective  undergarments as indicated by < 3/24 hour period to demonstrate improved bowel control and allow for increased participation in activities including, but not limited to: walking, grocery shopping, and gardening.    Baseline IE: 3x/day or more    Time 12    Period Weeks    Status New    Target Date 05/09/21      PT LONG TERM GOAL #2   Title Patient will report less than/equal to 2x/night of emptying bladder with toileting strategies before bed in order to reduce disruption of sleep and improve overall sleep quality.    Baseline IE: 3x/night    Time 12    Period Weeks    Status New    Target Date 05/09/21      PT LONG TERM GOAL #3  Title Patient will demonstrate circumferential and sequential contraction of >4/5 MMT, > 6 sec hold x10 and 5 consecutive quick flicks with </= 10 min rest between testing bouts, and relaxation of the PFM coordinated with breath for improved management of intra-abdominal pressure and normal bowel and bladder function without the presence of pain nor incontinence in order to improve participation at home and in the community.    Baseline IE: will assess next visit    Time 12    Period Weeks    Status New    Target Date 05/09/21      PT LONG TERM GOAL #4   Title Patient will be able to achieve a score of >/=58 on the FOTO-Urinary Problem outcome measure to demonstrate a clinically significant change in symptoms and improvement of overall QOL.    Baseline IE: 48    Time 12    Period Weeks    Status New    Target Date 05/09/21      PT LONG TERM GOAL #5   Title Patient will be able to demonstrate having quite a bit or moderate bladder control confidence  in order to reduce urinary incontinence episodes inside and outside the home.    Baseline IE: little confidence    Time 12    Period Weeks    Status New    Target Date 05/09/21                   Plan - 02/21/21 1018     Clinical Impression Statement Patient presents to clinic with excellent  motivation to participate in today's session. Upon physical assessment, patient has deficits in PFM coordination, PFM strength, pelvic organ support, posture, and IAP management as evidenced by minimal diastasis above the umbilicus (2.5 fingers), increased R IC height, and increased breath holding and rectus abdominus mm compensation with diaphragmatic breathing. Patient was able to achieve basic understanding of breathing mechanics and coordination of the PFM with minimal cueing and responded positively to active and educational interventions. Patient will benefit from continued skilled therapeutic intervention to address deficits in PFM coordination, PFM strength, pelvic organ support, posture, and IAP management in order to improve function, and increase overall QOL.    Personal Factors and Comorbidities Age;Comorbidity 3+;Past/Current Experience;Time since onset of injury/illness/exacerbation    Comorbidities DM, diverticulosis, colonic polyps, hypercholesterolemia, stroke, thrombocytopenia    Examination-Activity Limitations Bed Mobility;Hygiene/Grooming;Bend;Lift;Squat;Locomotion Level;Stand;Continence;Toileting    Examination-Participation Restrictions Interpersonal Relationship;Church;Cleaning;Laundry;Yard Work;Community Activity;Shop    Stability/Clinical Decision Making Evolving/Moderate complexity    Rehab Potential Fair    PT Frequency 1x / week    PT Duration 12 weeks    PT Treatment/Interventions ADLs/Self Care Home Management;Cryotherapy;Electrical Stimulation;Biofeedback;Moist Heat;DME Instruction;Gait training;Stair training;Functional mobility training;Therapeutic activities;Therapeutic exercise;Balance training;Neuromuscular re-education;Patient/family education;Cognitive remediation;Orthotic Fit/Training;Manual techniques;Scar mobilization;Taping;Compression bandaging;Spinal Manipulations;Joint Manipulations    PT Next Visit Plan PFM external, begin TrA    PT Home Exercise Plan  diaphragmatic breathing    Consulted and Agree with Plan of Care Patient             Patient will benefit from skilled therapeutic intervention in order to improve the following deficits and impairments:  Decreased endurance, Increased muscle spasms, Decreased scar mobility, Improper body mechanics, Decreased activity tolerance, Decreased coordination, Decreased strength, Postural dysfunction, Decreased mobility, Decreased skin integrity, Hypomobility  Visit Diagnosis: Muscle weakness (generalized)  Unspecified lack of coordination  Posture imbalance     Problem List Patient Active Problem List   Diagnosis Date Noted   Tick bite of left  thigh 10/17/2020   Itching 10/17/2020   Prolapse of female bladder, acquired 09/23/2020   Loose stools 04/30/2020   History of COVID-19 06/28/2019   Cough 09/02/2017   Glaucoma 03/25/2016   Type 2 diabetes mellitus with vascular disease ([place]) 11/27/2015   History of colonic polyps 04/07/2015   History of CVA (cerebrovascular accident) 08/02/2014   Stress 08/02/2014   Plantar fasciitis 07/05/2014   BMI 30.0-30.9,adult 07/05/2014   Health care maintenance 07/05/2014   Breast pain, right 05/25/2014   Pain of left heel 01/03/2014   Pain in soft tissues of limb 01/03/2014   Foot fracture, left 07/07/2013   Thrombocytopenia ([place]) 03/26/2012   Hypercholesteremia 03/26/2012    Lewin Pellow, SPT  This entire session was performed under direct supervision and direction of a licensed Chiropractor . I have personally read, edited and approve of the note as written.  Myles Gip PT, DPT [phone]  02/21/2021, 1:53 PM  [facility] [address] [place], [place], [address] Phone: [phone]   Fax:  [phone]  Name: Victoria Harris MRN: [id number] Date of Birth: Sep 18, 1950

## 2021-02-27 NOTE — Telephone Encounter (Signed)
This morning she had piece of toast and peanut butter and sugar free peach jam and keto granola. After she ate she went to the bathroom stool was oily . She found out that it was her gall bladder. She is not going a frequently after stopping the metformin. She has not taken any metformin since Tuesday night.  Blood sugars this morning before eating 147. She she go back taking her metformin in the morning?

## 2021-02-27 NOTE — Telephone Encounter (Signed)
Remain off metformin for now.  Monitor diet and sugar intake.  Keep diary if any triggers - affecting her bowels.  If persistent diarrhea, would like to check stool to confirm no c.diff.  confirm she is eating and drinking ok.  If persistent problems, will need to be evaluated.

## 2021-02-27 NOTE — Telephone Encounter (Signed)
Patient stated she is doing better and she is eating and drinking well. Also keeping up with her BS. Patient has been informed of PCP directions.

## 2021-02-28 ENCOUNTER — Ambulatory Visit: Payer: PPO | Attending: Obstetrics and Gynecology | Admitting: Physical Therapy

## 2021-02-28 ENCOUNTER — Other Ambulatory Visit: Payer: Self-pay

## 2021-02-28 ENCOUNTER — Encounter: Payer: Self-pay | Admitting: Physical Therapy

## 2021-02-28 DIAGNOSIS — R279 Unspecified lack of coordination: Secondary | ICD-10-CM | POA: Insufficient documentation

## 2021-02-28 DIAGNOSIS — M6281 Muscle weakness (generalized): Secondary | ICD-10-CM | POA: Diagnosis not present

## 2021-02-28 DIAGNOSIS — R293 Abnormal posture: Secondary | ICD-10-CM | POA: Diagnosis not present

## 2021-02-28 NOTE — Therapy (Signed)
[facility] [address] [place], [place], [address] Phone: [phone]   Fax:  [phone]  Physical Therapy Treatment  Patient Details  Name: Victoria Harris MRN: [id number] Date of Birth: 1951/05/23 Referring Provider (PT): Sherlene Shams   Encounter Date: 02/28/2021   PT End of Session - 02/28/21 1027     Visit Number 3    Number of Visits 12    Date for PT Re-Evaluation 05/09/21    Authorization Type IE: 02/14/2021    PT Start Time 1030    PT Stop Time 1112    PT Time Calculation (min) 42 min    Activity Tolerance Patient tolerated treatment well    Behavior During Therapy [facility] for tasks assessed/performed             Past Medical History:  Diagnosis Date   Colon polyps    hyperplastic   Diabetes mellitus without complication ([place])    Diverticulosis    Hx of adenomatous colonic polyps    tubular adenoma   Hypercholesterolemia    Serrated adenoma of colon    Stroke (cerebrum) ([place])    Thrombocytopenia ([place])    Uterus descensus     Past Surgical History:  Procedure Laterality Date   ABDOMINAL HYSTERECTOMY  2009   CHOLECYSTECTOMY  2007   COLONOSCOPY N/A 04/05/2015   Procedure: COLONOSCOPY;  Surgeon: Lollie Sails, MD;  Location: [facility] ENDOSCOPY;  Service: Endoscopy;  Laterality: N/A;   COLONOSCOPY WITH PROPOFOL N/A 10/09/2016   Procedure: COLONOSCOPY WITH PROPOFOL;  Surgeon: Lollie Sails, MD;  Location[facility] ENDOSCOPY;  Service: Endoscopy;  Laterality: N/A;   COLONOSCOPY WITH PROPOFOL N/A 12/02/2019   Procedure: COLONOSCOPY WITH PROPOFOL;  Surgeon: Toledo, Benay Pike, MD;  Location: [facility] ENDOSCOPY;  Service: Gastroenterology;  Laterality: N/A;   DILATION AND CURETTAGE OF UTERUS  2001 and 2006   FOOT SURGERY  2007   TONSILLECTOMY  1960   TUBAL LIGATION     vocal cord cyst removed      There were no vitals filed for this visit.   Subjective Assessment - 02/28/21 1027     Subjective Patient  contacted doctors office about Metformin and they told her to stop taking Metformin. They asked for her to call Friday but patient forgot and called yesterday. Doctor stated to continue not taking Metformin. Patient reports feeling the urge to have bowel movements but no increase in leakage. Patient has had to clean undergarments but reports not needing to wear a pad. Patient gets the urge to use the restroom and can make it but states bowels start to come once she begins to pull pants down. Patient continues to check her sugar in the mornings, this morning (147), and knows to keep it under 180.    Currently in Pain? No/denies            TREATMENT   Neuromuscular Re-education:  Supine hooklying diaphragmatic breathing with VCs and TCs for improved IAP management and decreased pressure on the PFM   Positional variation of hips above shoulders to decrease load on PFM and allow for increased pelvic organ support  Supine hooklying with pillow underneath hips and coordinated breath, 1-2 min hold,   Childs pose with pillow at heels for tactile feedback and coordinated breath, 1-2 min hold   Standing hip hinge with UE resting on chair and coordinated breath, 1 min hold   Reviewed log rolling technique for decreased pressure on the PFM. Patient educated throughout  session on appropriate technique and form using multi-modal cueing, HEP, and activity modification. Patient articulated understanding and returned demonstration.  Patient response to interventions:  Patient stated she felt "something move" while in childs pose but did not cause any pain or discomfort.   ASSESSMENT Patient presents to clinic with excellent motivation to participate in today's session. Upon physical assessment, patient has deficits in PFM coordination, PFM strength, pelvic organ support, posture, and IAP management. Patient with improved bowel leakage symptoms and stated she will continue to monitor blood glucose, and manage  diabetes through portion control. Patient performed a variety of positional variations for increased pelvic organ support and decreased load on the PFM with moderate cueing for decreased body compensations. Patient was able to achieve basic understanding of breathing mechanics and coordination of the PFM and responded positively to active and educational interventions. Patient will benefit from continued skilled therapeutic intervention to address deficits in PFM coordination, PFM strength, pelvic organ support, posture, and IAP management in order to improve function, and increase overall QOL.       PT Long Term Goals - 02/14/21 1319       PT LONG TERM GOAL #1   Title Patient will report decreased reliance on protective undergarments as indicated by < 3/24 hour period to demonstrate improved bowel control and allow for increased participation in activities including, but not limited to: walking, grocery shopping, and gardening.    Baseline IE: 3x/day or more    Time 12    Period Weeks    Status New    Target Date 05/09/21      PT LONG TERM GOAL #2   Title Patient will report less than/equal to 2x/night of emptying bladder with toileting strategies before bed in order to reduce disruption of sleep and improve overall sleep quality.    Baseline IE: 3x/night    Time 12    Period Weeks    Status New    Target Date 05/09/21      PT LONG TERM GOAL #3   Title Patient will demonstrate circumferential and sequential contraction of >4/5 MMT, > 6 sec hold x10 and 5 consecutive quick flicks with </= 10 min rest between testing bouts, and relaxation of the PFM coordinated with breath for improved management of intra-abdominal pressure and normal bowel and bladder function without the presence of pain nor incontinence in order to improve participation at home and in the community.    Baseline IE: will assess next visit    Time 12    Period Weeks    Status New    Target Date 05/09/21      PT LONG  TERM GOAL #4   Title Patient will be able to achieve a score of >/=58 on the FOTO-Urinary Problem outcome measure to demonstrate a clinically significant change in symptoms and improvement of overall QOL.    Baseline IE: 48    Time 12    Period Weeks    Status New    Target Date 05/09/21      PT LONG TERM GOAL #5   Title Patient will be able to demonstrate having quite a bit or moderate bladder control confidence  in order to reduce urinary incontinence episodes inside and outside the home.    Baseline IE: little confidence    Time 12    Period Weeks    Status New    Target Date 05/09/21  Plan - 02/28/21 1028     Clinical Impression Statement Patient presents to clinic with excellent motivation to participate in today's session. Upon physical assessment, patient has deficits in PFM coordination, PFM strength, pelvic organ support, posture, and IAP management. Patient with improved bowel leakage symptoms and stated she will continue to monitor blood glucose, and manage diabetes through portion control. Patient performed a variety of positional variations for increased pelvic organ support and decreased load on the PFM with moderate cueing for decreased body compensations. Patient was able to achieve basic understanding of breathing mechanics and coordination of the PFM and responded positively to active and educational interventions. Patient will benefit from continued skilled therapeutic intervention to address deficits in PFM coordination, PFM strength, pelvic organ support, posture, and IAP management in order to improve function, and increase overall QOL.    Personal Factors and Comorbidities Age;Comorbidity 3+;Past/Current Experience;Time since onset of injury/illness/exacerbation    Comorbidities DM, diverticulosis, colonic polyps, hypercholesterolemia, stroke, thrombocytopenia    Examination-Activity Limitations Bed  Mobility;Hygiene/Grooming;Bend;Lift;Squat;Locomotion Level;Stand;Continence;Toileting    Examination-Participation Restrictions Interpersonal Relationship;Church;Cleaning;Laundry;Yard Work;Community Activity;Shop    Stability/Clinical Decision Making Evolving/Moderate complexity    Rehab Potential Fair    PT Frequency 1x / week    PT Duration 12 weeks    PT Treatment/Interventions ADLs/Self Care Home Management;Cryotherapy;Electrical Stimulation;Biofeedback;Moist Heat;DME Instruction;Gait training;Stair training;Functional mobility training;Therapeutic activities;Therapeutic exercise;Balance training;Neuromuscular re-education;Patient/family education;Cognitive remediation;Orthotic Fit/Training;Manual techniques;Scar mobilization;Taping;Compression bandaging;Spinal Manipulations;Joint Manipulations    PT Next Visit Plan PFM external, begin TrA    PT Home Exercise Plan positional variation handout for prolapse    Consulted and Agree with Plan of Care Patient             Patient will benefit from skilled therapeutic intervention in order to improve the following deficits and impairments:  Decreased endurance, Increased muscle spasms, Decreased scar mobility, Improper body mechanics, Decreased activity tolerance, Decreased coordination, Decreased strength, Postural dysfunction, Decreased mobility, Decreased skin integrity, Hypomobility  Visit Diagnosis: Muscle weakness (generalized)  Unspecified lack of coordination  Posture imbalance     Problem List Patient Active Problem List   Diagnosis Date Noted   Tick bite of left thigh 10/17/2020   Itching 10/17/2020   Prolapse of female bladder, acquired 09/23/2020   Loose stools 04/30/2020   History of COVID-19 06/28/2019   Cough 09/02/2017   Glaucoma 03/25/2016   Type 2 diabetes mellitus with vascular disease ([place]) 11/27/2015   History of colonic polyps 04/07/2015   History of CVA (cerebrovascular accident) 08/02/2014   Stress  08/02/2014   Plantar fasciitis 07/05/2014   BMI 30.0-30.9,adult 07/05/2014   Health care maintenance 07/05/2014   Breast pain, right 05/25/2014   Pain of left heel 01/03/2014   Pain in soft tissues of limb 01/03/2014   Foot fracture, left 07/07/2013   Thrombocytopenia ([place]) 03/26/2012   Hypercholesteremia 03/26/2012   Victoria Harris, SPT  This entire session was performed under direct supervision and direction of a licensed Chiropractor . I have personally read, edited and approve of the note as written.  Myles Gip PT, DPT [phone]  02/28/2021, 1:23 PM  [facility] [address] [place], [place], [address] Phone: [phone]   Fax:  [phone]  Name: Victoria Harris MRN: [id number] Date of Birth: Sep 22, 1950

## 2021-03-06 ENCOUNTER — Other Ambulatory Visit: Payer: Self-pay

## 2021-03-06 ENCOUNTER — Ambulatory Visit: Payer: PPO | Admitting: Obstetrics and Gynecology

## 2021-03-06 ENCOUNTER — Encounter: Payer: Self-pay | Admitting: Obstetrics and Gynecology

## 2021-03-06 VITALS — BP 121/82 | HR 82

## 2021-03-06 DIAGNOSIS — R159 Full incontinence of feces: Secondary | ICD-10-CM

## 2021-03-06 DIAGNOSIS — N3281 Overactive bladder: Secondary | ICD-10-CM | POA: Diagnosis not present

## 2021-03-06 NOTE — Progress Notes (Signed)
[facility] Return Visit  SUBJECTIVE  History of Present Illness: Victoria Harris is a 70 y.o. female seen in follow-up for OAB, fecal incontinence and prolapse. Plan at last visit was to start Myrbetriq and physical therapy for the incontinence.   She feels that Victoria Harris has been working really well. Leakage and urgency is a lot less. Has been doing PT exercises, but sometimes has a hard time fitting them in. She stopped her metformin recently and has not had any further episodes of bowel leakage. Took one dose of imodium last week which helped.    Past Medical History: Patient  has a past medical history of Colon polyps, Diabetes mellitus without complication ([place]), Diverticulosis, adenomatous colonic polyps, Hypercholesterolemia, Serrated adenoma of colon, Stroke (cerebrum) ([place]), Thrombocytopenia ([place]), and Uterus descensus.   Past Surgical History: She  has a past surgical history that includes Cholecystectomy (2007); Tonsillectomy (1960); Abdominal hysterectomy (2009); Foot surgery (2007); Dilation and curettage of uterus (2001 and 2006); vocal cord cyst removed; Colonoscopy (N/A, 04/05/2015); Tubal ligation; Colonoscopy with propofol (N/A, 10/09/2016); and Colonoscopy with propofol (N/A, 12/02/2019).   Medications: She has a current medication list which includes the following prescription(s): aspirin, cholecalciferol, colestipol, dorzolamide-timolol, metformin, mirabegron er, multiple vitamin, onetouch verio, probiotic daily, rosuvastatin, travatan z, and triamcinolone ointment.   Allergies: Patient is allergic to no known drug allergy.   Social History: Patient  reports that she has never smoked. She has never used smokeless tobacco. She reports that she does not drink alcohol and does not use drugs.      OBJECTIVE     Physical Exam: Vitals:   03/06/21 1407  BP: 121/82  Pulse: 82   Gen: No apparent distress, A&O x 3.  Detailed Urogynecologic Evaluation:   Deferred.    ASSESSMENT AND PLAN    Victoria Harris is a 70 y.o. with:  1. Overactive bladder   2. Incontinence of feces, unspecified fecal incontinence type    OAB - continue with myrbetriq 25 mg. She would eventually like to try to wean off once she has had more physical therapy sessions.   2. Fecal incontinence - improved since stopping metformin - take imodium as needed  Return 3 months  Victoria Folds, MD  Time spent: I spent 20 minutes dedicated to the care of this patient on the date of this encounter to include pre-visit review of records, face-to-face time with the patient and post visit documentation.

## 2021-03-07 ENCOUNTER — Ambulatory Visit: Payer: PPO | Admitting: Physical Therapy

## 2021-03-07 ENCOUNTER — Encounter: Payer: Self-pay | Admitting: Physical Therapy

## 2021-03-07 DIAGNOSIS — M6281 Muscle weakness (generalized): Secondary | ICD-10-CM | POA: Diagnosis not present

## 2021-03-07 DIAGNOSIS — R293 Abnormal posture: Secondary | ICD-10-CM

## 2021-03-07 DIAGNOSIS — R279 Unspecified lack of coordination: Secondary | ICD-10-CM

## 2021-03-07 NOTE — Therapy (Signed)
[facility] [address] [place], [place], [address] Phone: [phone]   Fax:  [phone]  Physical Therapy Treatment  Patient Details  Name: Tenley Winward MRN: [id number] Date of Birth: [birth year] Referring Provider (PT): Sherlene Shams   Encounter Date: 03/07/2021   PT End of Session - 03/07/21 1019     Visit Number 4    Number of Visits 12    Date for PT Re-Evaluation 05/09/21    Authorization Type IE: 02/14/2021    PT Start Time 1025    PT Stop Time 1105    PT Time Calculation (min) 40 min    Activity Tolerance Patient tolerated treatment well    Behavior During Therapy [facility] for tasks assessed/performed             Past Medical History:  Diagnosis Date   Colon polyps    hyperplastic   Diabetes mellitus without complication ([place])    Diverticulosis    Hx of adenomatous colonic polyps    tubular adenoma   Hypercholesterolemia    Serrated adenoma of colon    Stroke (cerebrum) ([place])    Thrombocytopenia ([place])    Uterus descensus     Past Surgical History:  Procedure Laterality Date   ABDOMINAL HYSTERECTOMY  2009   CHOLECYSTECTOMY  2007   COLONOSCOPY N/A 04/05/2015   Procedure: COLONOSCOPY;  Surgeon: Lollie Sails, MD;  Location: [facility] ENDOSCOPY;  Service: Endoscopy;  Laterality: N/A;   COLONOSCOPY WITH PROPOFOL N/A 10/09/2016   Procedure: COLONOSCOPY WITH PROPOFOL;  Surgeon: Lollie Sails, MD;  Location: [facility] ENDOSCOPY;  Service: Endoscopy;  Laterality: N/A;   COLONOSCOPY WITH PROPOFOL N/A 12/02/2019   Procedure: COLONOSCOPY WITH PROPOFOL;  Surgeon: Toledo, Benay Pike, MD;  Location: [facility] ENDOSCOPY;  Service: Gastroenterology;  Laterality: N/A;   DILATION AND CURETTAGE OF UTERUS  2001 and 2006   FOOT SURGERY  2007   TONSILLECTOMY  1960   TUBAL LIGATION     vocal cord cyst removed      There were no vitals filed for this visit.   Subjective Assessment - 03/07/21 1019     Subjective  Patient reports only having one episode of urgency with bowel leakage but has continued not to wear protective undergarments for bowel leakage. Patient is continuing to monitor blood sugars every day since she is not taking Metformin and they have been pretty consistent. Patient has appointment with doctor in November to discuss treatment going forward for diabetes. Patient also reports that the OAB medication is helping and she doesn't feel the urge to urinate as often.    Currently in Pain? No/denies             TREATMENT   Neuromuscular Re-education:  Supine hooklying diaphragmatic breathing with VCs and TCs for improved IAP management and decreased pressure on the PFM  Seated diaphragmatic breathing with VCs and TCs for improved IAP management and decreased pressure on the PFM   Supine hooklying TrA activation with DRAM compression, VCs and TCs for IAP management  Seated TrA activation with DRAM compression, VCs and TCs for IAP management   Patient discussion on deep core anatomy and the parasympathetic/sympathetic nervous system in regards to targeting the vagus nerve through diaphragmatic breathing.   Patient educated throughout session on appropriate technique and form using multi-modal cueing, HEP, and activity modification. Patient articulated understanding and returned demonstration.  Patient response to interventions:  Patient responded best to activating the TrA and feeling a "subtle  tightness" through an extended exhale until the TrA naturally activates. Patient educated to take several belly breaths in between doing the next rep to avoid hyperventilation.   ASSESSMENT Patient presents to clinic with excellent motivation to participate in today's session. Patient has deficits in PFM coordination, PFM strength, pelvic organ support, posture, and IAP management. Patient with improved bowel and bladder leakage symptoms since last session. Patient performed TrA activation with an  extended exhale and moderate verbal and tactile cueing in order to achieve adequate intrinsic  feedback from the deep core mm. Patient was able to achieve basic understanding of the anatomy of the deep core, IAP management, diaphragmatic breathing and responded positively to active and educational interventions. Patient will benefit from continued skilled therapeutic intervention to address deficits in PFM coordination, PFM strength, pelvic organ support, posture, and IAP management in order to improve function, and increase overall QOL.        PT Long Term Goals - 02/14/21 1319       PT LONG TERM GOAL #1   Title Patient will report decreased reliance on protective undergarments as indicated by < 3/24 hour period to demonstrate improved bowel control and allow for increased participation in activities including, but not limited to: walking, grocery shopping, and gardening.    Baseline IE: 3x/day or more    Time 12    Period Weeks    Status New    Target Date 05/09/21      PT LONG TERM GOAL #2   Title Patient will report less than/equal to 2x/night of emptying bladder with toileting strategies before bed in order to reduce disruption of sleep and improve overall sleep quality.    Baseline IE: 3x/night    Time 12    Period Weeks    Status New    Target Date 05/09/21      PT LONG TERM GOAL #3   Title Patient will demonstrate circumferential and sequential contraction of >4/5 MMT, > 6 sec hold x10 and 5 consecutive quick flicks with </= 10 min rest between testing bouts, and relaxation of the PFM coordinated with breath for improved management of intra-abdominal pressure and normal bowel and bladder function without the presence of pain nor incontinence in order to improve participation at home and in the community.    Baseline IE: will assess next visit    Time 12    Period Weeks    Status New    Target Date 05/09/21      PT LONG TERM GOAL #4   Title Patient will be able to achieve a  score of >/=58 on the FOTO-Urinary Problem outcome measure to demonstrate a clinically significant change in symptoms and improvement of overall QOL.    Baseline IE: 48    Time 12    Period Weeks    Status New    Target Date 05/09/21      PT LONG TERM GOAL #5   Title Patient will be able to demonstrate having quite a bit or moderate bladder control confidence  in order to reduce urinary incontinence episodes inside and outside the home.    Baseline IE: little confidence    Time 12    Period Weeks    Status New    Target Date 05/09/21                   Plan - 03/07/21 1019     Clinical Impression Statement Patient presents to clinic with excellent motivation to participate  in today's session. Patient has deficits in PFM coordination, PFM strength, pelvic organ support, posture, and IAP management. Patient with improved bowel and bladder leakage symptoms since last session. Patient performed TrA activation with an extended exhale and moderate verbal and tactile cueing in order to achieve adequate intrinsic feedback from the deep core mm. Patient was able to achieve basic understanding of the anatomy of the deep core, IAP management, diaphragmatic breathing and responded positively to active and educational interventions. Patient will benefit from continued skilled therapeutic intervention to address deficits in PFM coordination, PFM strength, pelvic organ support, posture, and IAP management in order to improve function, and increase overall QOL.    Personal Factors and Comorbidities Age;Comorbidity 3+;Past/Current Experience;Time since onset of injury/illness/exacerbation    Comorbidities DM, diverticulosis, colonic polyps, hypercholesterolemia, stroke, thrombocytopenia    Examination-Activity Limitations Bed Mobility;Hygiene/Grooming;Bend;Lift;Squat;Locomotion Level;Stand;Continence;Toileting    Examination-Participation Restrictions Interpersonal  Relationship;Church;Cleaning;Laundry;Yard Work;Community Activity;Shop    Stability/Clinical Decision Making Evolving/Moderate complexity    Rehab Potential Fair    PT Frequency 1x / week    PT Duration 12 weeks    PT Treatment/Interventions ADLs/Self Care Home Management;Cryotherapy;Electrical Stimulation;Biofeedback;Moist Heat;DME Instruction;Gait training;Stair training;Functional mobility training;Therapeutic activities;Therapeutic exercise;Balance training;Neuromuscular re-education;Patient/family education;Cognitive remediation;Orthotic Fit/Training;Manual techniques;Scar mobilization;Taping;Compression bandaging;Spinal Manipulations;Joint Manipulations    PT Next Visit Plan PFM external, progress TrA    PT Home Exercise Plan seated diaphragmatic breathing, supine TrA activation    Consulted and Agree with Plan of Care Patient             Patient will benefit from skilled therapeutic intervention in order to improve the following deficits and impairments:  Decreased endurance, Increased muscle spasms, Decreased scar mobility, Improper body mechanics, Decreased activity tolerance, Decreased coordination, Decreased strength, Postural dysfunction, Decreased mobility, Decreased skin integrity, Hypomobility  Visit Diagnosis: Muscle weakness (generalized)  Unspecified lack of coordination  Posture imbalance     Problem List Patient Active Problem List   Diagnosis Date Noted   Tick bite of left thigh 10/17/2020   Itching 10/17/2020   Prolapse of female bladder, acquired 09/23/2020   Loose stools 04/30/2020   History of COVID-19 06/28/2019   Cough 09/02/2017   Glaucoma 03/25/2016   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 I have personally read, edited and approve of the note as written.  Myles Gip PT, DPT [phone]  03/07/2021, 2:10 PM  [place] [facility] [address] [place], [place], [address] Phone: [phone]   Fax:  [phone]  Name: Mieko Kneebone MRN: [id number] Date of Birth: 1951/03/02

## 2021-03-14 ENCOUNTER — Encounter: Payer: PPO | Admitting: Physical Therapy

## 2021-03-15 ENCOUNTER — Encounter: Payer: Self-pay | Admitting: Physical Therapy

## 2021-03-15 ENCOUNTER — Other Ambulatory Visit: Payer: Self-pay

## 2021-03-15 ENCOUNTER — Ambulatory Visit: Payer: PPO | Admitting: Physical Therapy

## 2021-03-15 DIAGNOSIS — R279 Unspecified lack of coordination: Secondary | ICD-10-CM

## 2021-03-15 DIAGNOSIS — R293 Abnormal posture: Secondary | ICD-10-CM

## 2021-03-15 DIAGNOSIS — M6281 Muscle weakness (generalized): Secondary | ICD-10-CM | POA: Diagnosis not present

## 2021-03-15 NOTE — Therapy (Signed)
[facility] [address] [place], [place], [address] Phone: [phone]   Fax:  [phone]  Physical Therapy Treatment  Patient Details  Name: Victoria Harris MRN: [id number] Date of Birth: 1950-12-09 Referring Provider (PT): Sherlene Shams   Encounter Date: 03/15/2021   PT End of Session - 03/15/21 1030     Visit Number 5    Number of Visits 12    Date for PT Re-Evaluation 05/09/21    Authorization Type IE: 02/14/2021    PT Start Time 1030    PT Stop Time 1110    PT Time Calculation (min) 40 min    Activity Tolerance Patient tolerated treatment well    Behavior During Therapy [facility] for tasks assessed/performed             Past Medical History:  Diagnosis Date   Colon polyps    hyperplastic   Diabetes mellitus without complication ([place])    Diverticulosis    Hx of adenomatous colonic polyps    tubular adenoma   Hypercholesterolemia    Serrated adenoma of colon    Stroke (cerebrum) ([place])    Thrombocytopenia ([place])    Uterus descensus     Past Surgical History:  Procedure Laterality Date   ABDOMINAL HYSTERECTOMY  2009   CHOLECYSTECTOMY  2007   COLONOSCOPY N/A 04/05/2015   Procedure: COLONOSCOPY;  Surgeon: Lollie Sails, MD;  Location: [facility] ENDOSCOPY;  Service: Endoscopy;  Laterality: N/A;   COLONOSCOPY WITH PROPOFOL N/A 10/09/2016   Procedure: COLONOSCOPY WITH PROPOFOL;  Surgeon: Lollie Sails, MD;  Location: [facility] ENDOSCOPY;  Service: Endoscopy;  Laterality: N/A;   COLONOSCOPY WITH PROPOFOL N/A 12/02/2019   Procedure: COLONOSCOPY WITH PROPOFOL;  Surgeon: Toledo, Benay Pike, MD;  Location: [facility] ENDOSCOPY;  Service: Gastroenterology;  Laterality: N/A;   DILATION AND CURETTAGE OF UTERUS  2001 and 2006   FOOT SURGERY  2007   TONSILLECTOMY  1960   TUBAL LIGATION     vocal cord cyst removed      There were no vitals filed for this visit.   Subjective Assessment - 03/15/21 1030     Subjective  Patient reports practicing the supine diaphragmatic breathing and positional variations at home. Patient states that she has been having trouble incorporating her HEP into her daily routine. She feels that she has always had trouble with exercises in general. Patient had appointment with Dr. Wannetta Sender and doctor gave refill of OAB medication. Doctor would like to re-evaluate in a month and possibly discontinue OAB medication if patient feels pelvic floor PT is helping.    Currently in Pain? No/denies            TREATMENT   Neuromuscular Re-education:  Supine hooklying diaphragmatic breathing with VCs and TCs for improved IAP management and decreased pressure on the PFM   Supine hooklying TrA activation with bent knee fallouts, x6 mins, VCs and TCs as needed for movement sequencing    Supine hooklying bridges, x10, with VCs and TCs for improved LE strengthening to decrease load on PFM   Reviewed positional variation of hips above shoulders to decrease load on PFM and HEP to ensure proper form.   Patient educated throughout session on appropriate technique and form using multi-modal cueing, HEP, and activity modification. Patient articulated understanding and returned demonstration.  Patient response to interventions:  Patient responded well to the TrA activation sequence with LE movement and stated feeling a " slight inner tightness."    ASSESSMENT Patient  presents to clinic with excellent motivation to participate in today's session. Patient has deficits in PFM coordination, PFM strength, pelvic organ support, posture, and IAP management. Patient performed a deep core movement sequence with moderate cueing in order to achieve adequate intrinsic feedback from the transverse abdominus mm. Patient demonstrates improvement in diaphragmatic breathing and continues to practice at home. Patient was able to achieve basic understanding of PFM function in IAP management and responded positively to  active and educational interventions. Patient will benefit from continued skilled therapeutic intervention to address deficits in PFM coordination, PFM strength, pelvic organ support, posture, and IAP management in order to improve function, and increase overall QOL.       PT Long Term Goals - 02/14/21 1319       PT LONG TERM GOAL #1   Title Patient will report decreased reliance on protective undergarments as indicated by < 3/24 hour period to demonstrate improved bowel control and allow for increased participation in activities including, but not limited to: walking, grocery shopping, and gardening.    Baseline IE: 3x/day or more    Time 12    Period Weeks    Status New    Target Date 05/09/21      PT LONG TERM GOAL #2   Title Patient will report less than/equal to 2x/night of emptying bladder with toileting strategies before bed in order to reduce disruption of sleep and improve overall sleep quality.    Baseline IE: 3x/night    Time 12    Period Weeks    Status New    Target Date 05/09/21      PT LONG TERM GOAL #3   Title Patient will demonstrate circumferential and sequential contraction of >4/5 MMT, > 6 sec hold x10 and 5 consecutive quick flicks with </= 10 min rest between testing bouts, and relaxation of the PFM coordinated with breath for improved management of intra-abdominal pressure and normal bowel and bladder function without the presence of pain nor incontinence in order to improve participation at home and in the community.    Baseline IE: will assess next visit    Time 12    Period Weeks    Status New    Target Date 05/09/21      PT LONG TERM GOAL #4   Title Patient will be able to achieve a score of >/=58 on the FOTO-Urinary Problem outcome measure to demonstrate a clinically significant change in symptoms and improvement of overall QOL.    Baseline IE: 48    Time 12    Period Weeks    Status New    Target Date 05/09/21      PT LONG TERM GOAL #5   Title  Patient will be able to demonstrate having quite a bit or moderate bladder control confidence  in order to reduce urinary incontinence episodes inside and outside the home.    Baseline IE: little confidence    Time 12    Period Weeks    Status New    Target Date 05/09/21                   Plan - 03/15/21 1030     Clinical Impression Statement Patient presents to clinic with excellent motivation to participate in today's session. Patient has deficits in PFM coordination, PFM strength, pelvic organ support, posture, and IAP management. Patient performed a deep core movement sequence with moderate cueing in order to achieve adequate intrinsic feedback from the transverse abdominus mm.  Patient demonstrates improvement in diaphragmatic breathing and continues to practice at home. Patient was able to achieve basic understanding of PFM function in IAP management and responded positively to active and educational interventions. Patient will benefit from continued skilled therapeutic intervention to address deficits in PFM coordination, PFM strength, pelvic organ support, posture, and IAP management in order to improve function, and increase overall QOL.    Personal Factors and Comorbidities Age;Comorbidity 3+;Past/Current Experience;Time since onset of injury/illness/exacerbation    Comorbidities DM, diverticulosis, colonic polyps, hypercholesterolemia, stroke, thrombocytopenia    Examination-Activity Limitations Bed Mobility;Hygiene/Grooming;Bend;Lift;Squat;Locomotion Level;Stand;Continence;Toileting    Examination-Participation Restrictions Interpersonal Relationship;Church;Cleaning;Laundry;Yard Work;Community Activity;Shop    Stability/Clinical Decision Making Evolving/Moderate complexity    Rehab Potential Fair    PT Frequency 1x / week    PT Duration 12 weeks    PT Treatment/Interventions ADLs/Self Care Home Management;Cryotherapy;Electrical Stimulation;Biofeedback;Moist Heat;DME  Instruction;Gait training;Stair training;Functional mobility training;Therapeutic activities;Therapeutic exercise;Balance training;Neuromuscular re-education;Patient/family education;Cognitive remediation;Orthotic Fit/Training;Manual techniques;Scar mobilization;Taping;Compression bandaging;Spinal Manipulations;Joint Manipulations    PT Next Visit Plan PFM external, TrA progression    PT Home Exercise Plan TrA with bent knee fall outs, supine bridging    Consulted and Agree with Plan of Care Patient             Patient will benefit from skilled therapeutic intervention in order to improve the following deficits and impairments:  Decreased endurance, Increased muscle spasms, Decreased scar mobility, Improper body mechanics, Decreased activity tolerance, Decreased coordination, Decreased strength, Postural dysfunction, Decreased mobility, Decreased skin integrity, Hypomobility  Visit Diagnosis: Muscle weakness (generalized)  Unspecified lack of coordination  Posture imbalance     Problem List Patient Active Problem List   Diagnosis Date Noted   Tick bite of left thigh 10/17/2020   Itching 10/17/2020   Prolapse of female bladder, acquired 09/23/2020   Loose stools 04/30/2020   History of COVID-19 06/28/2019   Cough 09/02/2017   Glaucoma 03/25/2016   Type 2 diabetes mellitus with vascular disease ([place]) 11/27/2015   History of colonic polyps 04/07/2015   History of CVA (cerebrovascular accident) 08/02/2014   Stress 08/02/2014   Plantar fasciitis 07/05/2014   BMI 30.0-30.9,adult 07/05/2014   Health care maintenance 07/05/2014   Breast pain, right 05/25/2014   Pain of left heel 01/03/2014   Pain in soft tissues of limb 01/03/2014   Foot fracture, left 07/07/2013   Thrombocytopenia ([place]) 03/26/2012   Hypercholesteremia 03/26/2012    Tabita Corbo, Student-PT  This entire session was performed under direct supervision and direction of a licensed Chief Executive Officer. I have personally read, edited and approve of the note as written.  Myles Gip PT, DPT [phone]  03/15/2021, 11:28 AM  [facility] [address] [place], [place], [address] Phone: [phone]   Fax:  [phone]  Name: Victoria Harris MRN: [id number] Date of Birth: 19-Feb-1951

## 2021-03-21 ENCOUNTER — Encounter: Payer: PPO | Admitting: Physical Therapy

## 2021-03-28 ENCOUNTER — Encounter: Payer: Self-pay | Admitting: Physical Therapy

## 2021-03-28 ENCOUNTER — Other Ambulatory Visit: Payer: Self-pay

## 2021-03-28 ENCOUNTER — Ambulatory Visit: Payer: PPO | Attending: Obstetrics and Gynecology | Admitting: Physical Therapy

## 2021-03-28 DIAGNOSIS — M6281 Muscle weakness (generalized): Secondary | ICD-10-CM | POA: Diagnosis not present

## 2021-03-28 DIAGNOSIS — R293 Abnormal posture: Secondary | ICD-10-CM | POA: Diagnosis not present

## 2021-03-28 DIAGNOSIS — R279 Unspecified lack of coordination: Secondary | ICD-10-CM | POA: Insufficient documentation

## 2021-03-28 NOTE — Therapy (Signed)
[facility] [address] [place], [place], [address] Phone: [phone]   Fax:  [phone]  Physical Therapy Treatment  Patient Details  Name: Victoria Harris MRN: [id number] Date of Birth: [birth year] Referring Provider (PT): Sherlene Shams   Encounter Date: 03/28/2021   PT End of Session - 03/28/21 1025     Visit Number 6    Number of Visits 12    Date for PT Re-Evaluation 05/09/21    Authorization Type IE: 02/14/2021    PT Start Time 1030    PT Stop Time 1110    PT Time Calculation (min) 40 min    Activity Tolerance Patient tolerated treatment well    Behavior During Therapy [facility] for tasks assessed/performed             Past Medical History:  Diagnosis Date   Colon polyps    hyperplastic   Diabetes mellitus without complication ([place])    Diverticulosis    Hx of adenomatous colonic polyps    tubular adenoma   Hypercholesterolemia    Serrated adenoma of colon    Stroke (cerebrum) ([place])    Thrombocytopenia ([place])    Uterus descensus     Past Surgical History:  Procedure Laterality Date   ABDOMINAL HYSTERECTOMY  2009   CHOLECYSTECTOMY  2007   COLONOSCOPY N/A 04/05/2015   Procedure: COLONOSCOPY;  Surgeon: Lollie Sails, MD;  Location: [facility] ENDOSCOPY;  Service: Endoscopy;  Laterality: N/A;   COLONOSCOPY WITH PROPOFOL N/A 10/09/2016   Procedure: COLONOSCOPY WITH PROPOFOL;  Surgeon: Lollie Sails, MD;  Location: [facility] ENDOSCOPY;  Service: Endoscopy;  Laterality: N/A;   COLONOSCOPY WITH PROPOFOL N/A 12/02/2019   Procedure: COLONOSCOPY WITH PROPOFOL;  Surgeon: Toledo, Benay Pike, MD;  Location: [facility] ENDOSCOPY;  Service: Gastroenterology;  Laterality: N/A;   DILATION AND CURETTAGE OF UTERUS  2001 and 2006   FOOT SURGERY  2007   TONSILLECTOMY  1960   TUBAL LIGATION     vocal cord cyst removed      There were no vitals filed for this visit.   Subjective Assessment - 03/28/21 1032     Subjective Patient  notes she had a nice relaxing trip to the [place]. Patient notes that when out on the boat, she was doing well. She only had one time where she needed to interrupt the trip for a bathroom break. Patient notes a bit of GI upset after indulging in fried seafood. Patient notes that when in [place] she was able to delay and control urge using breathing techniques and PFM contraction.    Currently in Pain? No/denies             TREATMENT   Neuromuscular Re-education:  Reassessed goals; see below.  Patient education on "the knack" for transfers to interrupt breath holding habit/strategy for improved pelvic organ support and decreased risk of UI/FI.  Sit to stand with "knack" for improved PFM coordination. VCs as needed for sequencing.    Patient educated throughout session on appropriate technique and form using multi-modal cueing, HEP, and activity modification. Patient articulated understanding and returned demonstration.  Patient response to interventions:  Patient comfortable to trial new PFM strength exercises.   ASSESSMENT Patient presents to clinic with excellent motivation to participate in today's session. Patient has deficits in PFM coordination, PFM strength, pelvic organ support, posture, and IAP management. Patient indicating significant progress toward goal achievement during today's session and responded positively to active and educational interventions. Patient will benefit from  continued skilled therapeutic intervention to address deficits in PFM coordination, PFM strength, pelvic organ support, posture, and IAP management in order to improve function, and increase overall QOL.    PT Long Term Goals - 03/28/21 1052       PT LONG TERM GOAL #1   Title Patient will report decreased reliance on protective undergarments as indicated by < 3/24 hour period to demonstrate improved bowel control and allow for increased participation in activities including, but not limited to:  walking, grocery shopping, and gardening.    Baseline IE: 3x/day or more; 11/1: 0-1x/day depending on community activities    Time 12    Period Weeks    Status On-going    Target Date 05/09/21      PT LONG TERM GOAL #2   Title Patient will report less than/equal to 2x/night of emptying bladder with toileting strategies before bed in order to reduce disruption of sleep and improve overall sleep quality.    Baseline IE: 3x/night; 11/1: 1-2x/night    Time 12    Period Weeks    Status On-going    Target Date 05/09/21      PT LONG TERM GOAL #3   Title Patient will demonstrate circumferential and sequential contraction of >4/5 MMT, > 6 sec hold x10 and 5 consecutive quick flicks with </= 10 min rest between testing bouts, and relaxation of the PFM coordinated with breath for improved management of intra-abdominal pressure and normal bowel and bladder function without the presence of pain nor incontinence in order to improve participation at home and in the community.    Baseline IE: will assess next visit    Time 12    Period Weeks    Status New    Target Date 05/09/21      PT LONG TERM GOAL #4   Title Patient will be able to achieve a score of >/=58 on the FOTO-Urinary Problem outcome measure to demonstrate a clinically significant change in symptoms and improvement of overall QOL.    Baseline IE: 48; 11/1: 61    Time 12    Period Weeks    Status On-going    Target Date 05/09/21      PT LONG TERM GOAL #5   Title Patient will be able to demonstrate having quite a bit or moderate bladder control confidence  in order to reduce urinary incontinence episodes inside and outside the home.    Baseline IE: little confidence; 11/1: moderate confidence    Time 12    Period Weeks    Status On-going    Target Date 05/09/21                   Plan - 03/28/21 1026     Clinical Impression Statement Patient presents to clinic with excellent motivation to participate in today's session.  Patient has deficits in PFM coordination, PFM strength, pelvic organ support, posture, and IAP management. Patient indicating significant progress toward goal achievement during today's session and responded positively to active and educational interventions. Patient will benefit from continued skilled therapeutic intervention to address deficits in PFM coordination, PFM strength, pelvic organ support, posture, and IAP management in order to improve function, and increase overall QOL.    Personal Factors and Comorbidities Age;Comorbidity 3+;Past/Current Experience;Time since onset of injury/illness/exacerbation    Comorbidities DM, diverticulosis, colonic polyps, hypercholesterolemia, stroke, thrombocytopenia    Examination-Activity Limitations Bed Mobility;Hygiene/Grooming;Bend;Lift;Squat;Locomotion Level;Stand;Continence;Toileting    Examination-Participation Restrictions Interpersonal Relationship;Church;Cleaning;Laundry;Yard Work;Community Activity;Shop    Stability/Clinical Decision  Making Evolving/Moderate complexity    Rehab Potential Fair    PT Frequency 1x / week    PT Duration 12 weeks    PT Treatment/Interventions ADLs/Self Care Home Management;Cryotherapy;Electrical Stimulation;Biofeedback;Moist Heat;DME Instruction;Gait training;Stair training;Functional mobility training;Therapeutic activities;Therapeutic exercise;Balance training;Neuromuscular re-education;Patient/family education;Cognitive remediation;Orthotic Fit/Training;Manual techniques;Scar mobilization;Taping;Compression bandaging;Spinal Manipulations;Joint Manipulations    PT Next Visit Plan PFM external    PT Home Exercise Plan TrA with bent knee fall outs, supine bridging    Consulted and Agree with Plan of Care Patient             Patient will benefit from skilled therapeutic intervention in order to improve the following deficits and impairments:  Decreased endurance, Increased muscle spasms, Decreased scar mobility,  Improper body mechanics, Decreased activity tolerance, Decreased coordination, Decreased strength, Postural dysfunction, Decreased mobility, Decreased skin integrity, Hypomobility  Visit Diagnosis: Muscle weakness (generalized)  Unspecified lack of coordination  Posture imbalance     Problem List Patient Active Problem List   Diagnosis Date Noted   Tick bite of left thigh 10/17/2020   Itching 10/17/2020   Prolapse of female bladder, acquired 09/23/2020   Loose stools 04/30/2020   History of COVID-19 06/28/2019   Cough 09/02/2017   Glaucoma 03/25/2016   Type 2 diabetes mellitus with vascular disease ([place]) 11/27/2015   History of colonic polyps 04/07/2015   History of CVA (cerebrovascular accident) 08/02/2014   Stress 08/02/2014   Plantar fasciitis 07/05/2014   BMI 30.0-30.9,adult 07/05/2014   Health care maintenance 07/05/2014   Breast pain, right 05/25/2014   Pain of left heel 01/03/2014   Pain in soft tissues of limb 01/03/2014   Foot fracture, left 07/07/2013   Thrombocytopenia ([place]) 03/26/2012   Hypercholesteremia 03/26/2012    Myles Gip PT, DPT [phone]  03/28/2021, 11:31 AM  [facility] [address] [place], [place], [address] Phone: [phone]   Fax:  [phone]  Name: Victoria Harris MRN: [id number] Date of Birth: September 18, 1950

## 2021-04-03 ENCOUNTER — Other Ambulatory Visit: Payer: Self-pay

## 2021-04-03 ENCOUNTER — Ambulatory Visit: Payer: PPO | Admitting: Physical Therapy

## 2021-04-03 ENCOUNTER — Encounter: Payer: Self-pay | Admitting: Physical Therapy

## 2021-04-03 DIAGNOSIS — R279 Unspecified lack of coordination: Secondary | ICD-10-CM

## 2021-04-03 DIAGNOSIS — M6281 Muscle weakness (generalized): Secondary | ICD-10-CM

## 2021-04-03 DIAGNOSIS — R293 Abnormal posture: Secondary | ICD-10-CM

## 2021-04-03 NOTE — Therapy (Signed)
[facility] [address] [place], [place], [address] Phone: [phone]   Fax:  [phone]  Physical Therapy Treatment  Patient Details  Name: Victoria Harris MRN: [id number] Date of Birth: 1951-04-14 Referring Provider (PT): Sherlene Shams   Encounter Date: 04/03/2021   PT End of Session - 04/03/21 1038     Visit Number 7    Number of Visits 12    Date for PT Re-Evaluation 05/09/21    Authorization Type IE: 02/14/2021    PT Start Time 1035    PT Stop Time 1115    PT Time Calculation (min) 40 min    Activity Tolerance Patient tolerated treatment well    Behavior During Therapy [facility] for tasks assessed/performed             Past Medical History:  Diagnosis Date   Colon polyps    hyperplastic   Diabetes mellitus without complication ([place])    Diverticulosis    Hx of adenomatous colonic polyps    tubular adenoma   Hypercholesterolemia    Serrated adenoma of colon    Stroke (cerebrum) ([place])    Thrombocytopenia ([place])    Uterus descensus     Past Surgical History:  Procedure Laterality Date   ABDOMINAL HYSTERECTOMY  2009   CHOLECYSTECTOMY  2007   COLONOSCOPY N/A 04/05/2015   Procedure: COLONOSCOPY;  Surgeon: Lollie Sails, MD;  Location: [facility] ENDOSCOPY;  Service: Endoscopy;  Laterality: N/A;   COLONOSCOPY WITH PROPOFOL N/A 10/09/2016   Procedure: COLONOSCOPY WITH PROPOFOL;  Surgeon: Lollie Sails, MD;  Location: [facility] ENDOSCOPY;  Service: Endoscopy;  Laterality: N/A;   COLONOSCOPY WITH PROPOFOL N/A 12/02/2019   Procedure: COLONOSCOPY WITH PROPOFOL;  Surgeon: Toledo, Benay Pike, MD;  Location: [facility] ENDOSCOPY;  Service: Gastroenterology;  Laterality: N/A;   DILATION AND CURETTAGE OF UTERUS  2001 and 2006   FOOT SURGERY  2007   TONSILLECTOMY  1960   TUBAL LIGATION     vocal cord cyst removed      There were no vitals filed for this visit.   Subjective Assessment - 04/03/21 1037     Subjective Patient  reports that everything still seems to be going well. She states "no surprises" and notes only one occurrence of UUI in the middle of the night when she delayed longer to avoid disrupting sleep.    Currently in Pain? No/denies             TREATMENT   Neuromuscular Re-education:  Sidelying diaphragmatic breathing with VCs and TCs for downregulation of the nervous system and improved management of IAP Sidelying, thoracolumbar rotations (open-book) bilaterally with diaphragmatic breathing for improved diaphragmatic and rib cage excursion. VCs and TCs to prevent compensations. Sidelying, PFM lengthening with inhalation. VCs and TCs to decrease compensatory patterns and encourage optimal relaxation of the PFM. Sidelying, PFM contractions (endurance 3 sec x4; x6 fast twitch) with exhalation. VCs and TCs to decrease compensatory patterns and encourage activation of the PFM.    Patient educated throughout session on appropriate technique and form using multi-modal cueing, HEP, and activity modification. Patient articulated understanding and returned demonstration.  Patient response to interventions:  Patient comfortable to trial new PFM exercises.   ASSESSMENT Patient presents to clinic with excellent motivation to participate in today's session. Patient has deficits in PFM coordination, PFM strength, pelvic organ support, posture, and IAP management. Patient with palpable squeeze and lift from PFM during today's session and responded positively to active interventions. Patient  will benefit from continued skilled therapeutic intervention to address deficits in PFM coordination, PFM strength, pelvic organ support, posture, and IAP management in order to improve function, and increase overall QOL.     PT Long Term Goals - 03/28/21 1052       PT LONG TERM GOAL #1   Title Patient will report decreased reliance on protective undergarments as indicated by < 3/24 hour period to demonstrate  improved bowel control and allow for increased participation in activities including, but not limited to: walking, grocery shopping, and gardening.    Baseline IE: 3x/day or more; 11/1: 0-1x/day depending on community activities    Time 12    Period Weeks    Status On-going    Target Date 05/09/21      PT LONG TERM GOAL #2   Title Patient will report less than/equal to 2x/night of emptying bladder with toileting strategies before bed in order to reduce disruption of sleep and improve overall sleep quality.    Baseline IE: 3x/night; 11/1: 1-2x/night    Time 12    Period Weeks    Status On-going    Target Date 05/09/21      PT LONG TERM GOAL #3   Title Patient will demonstrate circumferential and sequential contraction of >4/5 MMT, > 6 sec hold x10 and 5 consecutive quick flicks with </= 10 min rest between testing bouts, and relaxation of the PFM coordinated with breath for improved management of intra-abdominal pressure and normal bowel and bladder function without the presence of pain nor incontinence in order to improve participation at home and in the community.    Baseline IE: will assess next visit    Time 12    Period Weeks    Status New    Target Date 05/09/21      PT LONG TERM GOAL #4   Title Patient will be able to achieve a score of >/=58 on the FOTO-Urinary Problem outcome measure to demonstrate a clinically significant change in symptoms and improvement of overall QOL.    Baseline IE: 48; 11/1: 61    Time 12    Period Weeks    Status On-going    Target Date 05/09/21      PT LONG TERM GOAL #5   Title Patient will be able to demonstrate having quite a bit or moderate bladder control confidence  in order to reduce urinary incontinence episodes inside and outside the home.    Baseline IE: little confidence; 11/1: moderate confidence    Time 12    Period Weeks    Status On-going    Target Date 05/09/21                   Plan - 04/03/21 1040     Clinical  Impression Statement Patient presents to clinic with excellent motivation to participate in today's session. Patient has deficits in PFM coordination, PFM strength, pelvic organ support, posture, and IAP management. Patient with palpable squeeze and lift from PFM during today's session and responded positively to active interventions. Patient will benefit from continued skilled therapeutic intervention to address deficits in PFM coordination, PFM strength, pelvic organ support, posture, and IAP management in order to improve function, and increase overall QOL.    Personal Factors and Comorbidities Age;Comorbidity 3+;Past/Current Experience;Time since onset of injury/illness/exacerbation    Comorbidities DM, diverticulosis, colonic polyps, hypercholesterolemia, stroke, thrombocytopenia    Examination-Activity Limitations Bed Mobility;Hygiene/Grooming;Bend;Lift;Squat;Locomotion Level;Stand;Continence;Toileting    Examination-Participation Restrictions Interpersonal Relationship;Church;Cleaning;Laundry;Yard Work;Community Activity;Shop  Stability/Clinical Decision Making Evolving/Moderate complexity    Rehab Potential Fair    PT Frequency 1x / week    PT Duration 12 weeks    PT Treatment/Interventions ADLs/Self Care Home Management;Cryotherapy;Electrical Stimulation;Biofeedback;Moist Heat;DME Instruction;Gait training;Stair training;Functional mobility training;Therapeutic activities;Therapeutic exercise;Balance training;Neuromuscular re-education;Patient/family education;Cognitive remediation;Orthotic Fit/Training;Manual techniques;Scar mobilization;Taping;Compression bandaging;Spinal Manipulations;Joint Manipulations    PT Next Visit Plan PFM progress    PT Home Exercise Plan TrA with bent knee fall outs, supine bridging    Consulted and Agree with Plan of Care Patient             Patient will benefit from skilled therapeutic intervention in order to improve the following deficits and  impairments:  Decreased endurance, Increased muscle spasms, Decreased scar mobility, Improper body mechanics, Decreased activity tolerance, Decreased coordination, Decreased strength, Postural dysfunction, Decreased mobility, Decreased skin integrity, Hypomobility  Visit Diagnosis: Muscle weakness (generalized)  Posture imbalance  Unspecified lack of coordination     Problem List Patient Active Problem List   Diagnosis Date Noted   Tick bite of left thigh 10/17/2020   Itching 10/17/2020   Prolapse of female bladder, acquired 09/23/2020   Loose stools 04/30/2020   History of COVID-19 06/28/2019   Cough 09/02/2017   Glaucoma 03/25/2016   Type 2 diabetes mellitus with vascular disease ([place]) 11/27/2015   History of colonic polyps 04/07/2015   History of CVA (cerebrovascular accident) 08/02/2014   Stress 08/02/2014   Plantar fasciitis 07/05/2014   BMI 30.0-30.9,adult 07/05/2014   Health care maintenance 07/05/2014   Breast pain, right 05/25/2014   Pain of left heel 01/03/2014   Pain in soft tissues of limb 01/03/2014   Foot fracture, left 07/07/2013   Thrombocytopenia ([place]) 03/26/2012   Hypercholesteremia 03/26/2012   Myles Gip PT, DPT [phone]   04/03/2021, 1:25 PM  [facility] [address] [place], [place], [address] Phone: [phone]   Fax:  [phone]  Name: Victoria Harris MRN: [id number] Date of Birth: [birth year]

## 2021-04-04 ENCOUNTER — Encounter: Payer: PPO | Admitting: Physical Therapy

## 2021-04-04 DIAGNOSIS — K529 Noninfective gastroenteritis and colitis, unspecified: Secondary | ICD-10-CM | POA: Diagnosis not present

## 2021-04-11 ENCOUNTER — Other Ambulatory Visit: Payer: Self-pay

## 2021-04-11 ENCOUNTER — Ambulatory Visit: Payer: PPO | Admitting: Physical Therapy

## 2021-04-11 ENCOUNTER — Encounter: Payer: Self-pay | Admitting: Physical Therapy

## 2021-04-11 DIAGNOSIS — M6281 Muscle weakness (generalized): Secondary | ICD-10-CM

## 2021-04-11 DIAGNOSIS — R293 Abnormal posture: Secondary | ICD-10-CM

## 2021-04-11 DIAGNOSIS — R279 Unspecified lack of coordination: Secondary | ICD-10-CM

## 2021-04-11 NOTE — Therapy (Signed)
[facility] [address]. [place], [place], [address] Phone: [phone]   Fax:  [phone]  Physical Therapy Treatment  Patient Details  Name: Victoria Harris MRN: [id number] Date of Birth: 07-12-1950 Referring Provider (PT): Sherlene Shams   Encounter Date: 04/11/2021   PT End of Session - 04/11/21 1045     Visit Number 8    Number of Visits 12    Date for PT Re-Evaluation 05/09/21    Authorization Type IE: 02/14/2021    PT Start Time 1030    PT Stop Time 1110    PT Time Calculation (min) 40 min    Activity Tolerance Patient tolerated treatment well    Behavior During Therapy [facility] for tasks assessed/performed             Past Medical History:  Diagnosis Date   Colon polyps    hyperplastic   Diabetes mellitus without complication ([place])    Diverticulosis    Hx of adenomatous colonic polyps    tubular adenoma   Hypercholesterolemia    Serrated adenoma of colon    Stroke (cerebrum) ([place])    Thrombocytopenia ([place])    Uterus descensus     Past Surgical History:  Procedure Laterality Date   ABDOMINAL HYSTERECTOMY  2009   CHOLECYSTECTOMY  2007   COLONOSCOPY N/A 04/05/2015   Procedure: COLONOSCOPY;  Surgeon: Lollie Sails, MD;  Location: [facility] ENDOSCOPY;  Service: Endoscopy;  Laterality: N/A;   COLONOSCOPY WITH PROPOFOL N/A 10/09/2016   Procedure: COLONOSCOPY WITH PROPOFOL;  Surgeon: Lollie Sails, MD;  Location: [facility] ENDOSCOPY;  Service: Endoscopy;  Laterality: N/A;   COLONOSCOPY WITH PROPOFOL N/A 12/02/2019   Procedure: COLONOSCOPY WITH PROPOFOL;  Surgeon: Toledo, Benay Pike, MD;  Location: [facility] ENDOSCOPY;  Service: Gastroenterology;  Laterality: N/A;   DILATION AND CURETTAGE OF UTERUS  2001 and 2006   FOOT SURGERY  2007   TONSILLECTOMY  1960   TUBAL LIGATION     vocal cord cyst removed      There were no vitals filed for this visit.   Subjective Assessment - 04/11/21 1032     Subjective  Patient notes that she had a positive visit with GI and is excited to report that she continues to have more regulated bowels. Patient is having BM 1-2x/day. Patient does endorse that she has an occasional oily stol, but GI educated patient on the normalcy of this in the absence of a gallbladder. Patient notes one night an incidence of UUI in the middle of sleep cycle. Patient notes she was able to use PFM, breath, and urge suppression strategies to delay urge to defecate until getting to the toilet. Patient excited to report that she was even able to enjoy a meal out in [place] and was able to make the drive home with good bowel control and without any incident.    Currently in Pain? No/denies             TREATMENT   Neuromuscular Re-education:  Patient education on body mechanics and postural strategies for pelvic organ position management during cooking tasks.  Practiced "knack" with movement of 5#, 10#, 18# weighted objects at counter height, thigh height, and carrying object for short distances (~10 ft).     Patient educated throughout session on appropriate technique and form using multi-modal cueing, HEP, and activity modification. Patient articulated understanding and returned demonstration.  Patient response to interventions:  Patient comfortable with body mechanics application.   ASSESSMENT Patient  presents to clinic with excellent motivation to participate in today's session. Patient has deficits in PFM coordination, PFM strength, pelvic organ support, posture, and IAP management. Patient able to perform carrying and lifting tasks with good body mechanics and minimal cueing during today's session and responded positively to active interventions. Patient will benefit from continued skilled therapeutic intervention to address deficits in PFM coordination, PFM strength, pelvic organ support, posture, and IAP management in order to improve function, and increase overall QOL.     PT Long Term Goals - 03/28/21 1052       PT LONG TERM GOAL #1   Title Patient will report decreased reliance on protective undergarments as indicated by < 3/24 hour period to demonstrate improved bowel control and allow for increased participation in activities including, but not limited to: walking, grocery shopping, and gardening.    Baseline IE: 3x/day or more; 11/1: 0-1x/day depending on community activities    Time 12    Period Weeks    Status On-going    Target Date 05/09/21      PT LONG TERM GOAL #2   Title Patient will report less than/equal to 2x/night of emptying bladder with toileting strategies before bed in order to reduce disruption of sleep and improve overall sleep quality.    Baseline IE: 3x/night; 11/1: 1-2x/night    Time 12    Period Weeks    Status On-going    Target Date 05/09/21      PT LONG TERM GOAL #3   Title Patient will demonstrate circumferential and sequential contraction of >4/5 MMT, > 6 sec hold x10 and 5 consecutive quick flicks with </= 10 min rest between testing bouts, and relaxation of the PFM coordinated with breath for improved management of intra-abdominal pressure and normal bowel and bladder function without the presence of pain nor incontinence in order to improve participation at home and in the community.    Baseline IE: will assess next visit    Time 12    Period Weeks    Status New    Target Date 05/09/21      PT LONG TERM GOAL #4   Title Patient will be able to achieve a score of >/=58 on the FOTO-Urinary Problem outcome measure to demonstrate a clinically significant change in symptoms and improvement of overall QOL.    Baseline IE: 48; 11/1: 61    Time 12    Period Weeks    Status On-going    Target Date 05/09/21      PT LONG TERM GOAL #5   Title Patient will be able to demonstrate having quite a bit or moderate bladder control confidence  in order to reduce urinary incontinence episodes inside and outside the home.    Baseline IE:  little confidence; 11/1: moderate confidence    Time 12    Period Weeks    Status On-going    Target Date 05/09/21                   Plan - 04/11/21 1045     Clinical Impression Statement Patient presents to clinic with excellent motivation to participate in today's session. Patient has deficits in PFM coordination, PFM strength, pelvic organ support, posture, and IAP management. Patient able to perform carrying and lifting tasks with good body mechanics and minimal cueing during today's session and responded positively to active interventions. Patient will benefit from continued skilled therapeutic intervention to address deficits in PFM coordination, PFM strength, pelvic organ support,  posture, and IAP management in order to improve function, and increase overall QOL.    Personal Factors and Comorbidities Age;Comorbidity 3+;Past/Current Experience;Time since onset of injury/illness/exacerbation    Comorbidities DM, diverticulosis, colonic polyps, hypercholesterolemia, stroke, thrombocytopenia    Examination-Activity Limitations Bed Mobility;Hygiene/Grooming;Bend;Lift;Squat;Locomotion Level;Stand;Continence;Toileting    Examination-Participation Restrictions Interpersonal Relationship;Church;Cleaning;Laundry;Yard Work;Community Activity;Shop    Stability/Clinical Decision Making Evolving/Moderate complexity    Rehab Potential Fair    PT Frequency 1x / week    PT Duration 12 weeks    PT Treatment/Interventions ADLs/Self Care Home Management;Cryotherapy;Electrical Stimulation;Biofeedback;Moist Heat;DME Instruction;Gait training;Stair training;Functional mobility training;Therapeutic activities;Therapeutic exercise;Balance training;Neuromuscular re-education;Patient/family education;Cognitive remediation;Orthotic Fit/Training;Manual techniques;Scar mobilization;Taping;Compression bandaging;Spinal Manipulations;Joint Manipulations    PT Next Visit Plan PFM progress    PT Home Exercise Plan  TrA with bent knee fall outs, supine bridging    Consulted and Agree with Plan of Care Patient             Patient will benefit from skilled therapeutic intervention in order to improve the following deficits and impairments:  Decreased endurance, Increased muscle spasms, Decreased scar mobility, Improper body mechanics, Decreased activity tolerance, Decreased coordination, Decreased strength, Postural dysfunction, Decreased mobility, Decreased skin integrity, Hypomobility  Visit Diagnosis: Muscle weakness (generalized)  Posture imbalance  Unspecified lack of coordination     Problem List Patient Active Problem List   Diagnosis Date Noted   Tick bite of left thigh 10/17/2020   Itching 10/17/2020   Prolapse of female bladder, acquired 09/23/2020   Loose stools 04/30/2020   History of COVID-19 06/28/2019   Cough 09/02/2017   Glaucoma 03/25/2016   Type 2 diabetes mellitus with vascular disease ([place]) 11/27/2015   History of colonic polyps 04/07/2015   History of CVA (cerebrovascular accident) 08/02/2014   Stress 08/02/2014   Plantar fasciitis 07/05/2014   BMI 30.0-30.9,adult 07/05/2014   Health care maintenance 07/05/2014   Breast pain, right 05/25/2014   Pain of left heel 01/03/2014   Pain in soft tissues of limb 01/03/2014   Foot fracture, left 07/07/2013   Thrombocytopenia ([place]) 03/26/2012   Hypercholesteremia 03/26/2012    Myles Gip PT, DPT [phone]  04/11/2021, 1:14 PM  [facility] [address] [place], [place], [address] Phone: [phone]   Fax:  [phone]  Name: Victoria Harris MRN: [id number] Date of Birth: Sep 01, 1950

## 2021-04-17 ENCOUNTER — Ambulatory Visit: Payer: PPO | Admitting: Podiatry

## 2021-04-24 ENCOUNTER — Other Ambulatory Visit: Payer: Self-pay

## 2021-04-24 ENCOUNTER — Other Ambulatory Visit (INDEPENDENT_AMBULATORY_CARE_PROVIDER_SITE_OTHER): Payer: PPO

## 2021-04-24 DIAGNOSIS — E7800 Pure hypercholesterolemia, unspecified: Secondary | ICD-10-CM | POA: Diagnosis not present

## 2021-04-24 DIAGNOSIS — E1159 Type 2 diabetes mellitus with other circulatory complications: Secondary | ICD-10-CM

## 2021-04-24 LAB — LIPID PANEL
Cholesterol: 187 mg/dL (ref 0–200)
HDL: 52.6 mg/dL (ref >39.00)
LDL Cholesterol: 97 mg/dL (ref 0–99)
NonHDL: 134.87
Total CHOL/HDL Ratio: 4
Triglycerides: 189 mg/dL — ABNORMAL HIGH (ref 0.0–149.0)
VLDL: 37.8 mg/dL (ref 0.0–40.0)

## 2021-04-24 LAB — HEPATIC FUNCTION PANEL
ALT: 20 U/L (ref 0–35)
AST: 19 U/L (ref 0–37)
Albumin: 4.5 g/dL (ref 3.5–5.2)
Alkaline Phosphatase: 61 U/L (ref 39–117)
Bilirubin, Direct: 0.1 mg/dL (ref 0.0–0.3)
Total Bilirubin: 0.7 mg/dL (ref 0.2–1.2)
Total Protein: 6.5 g/dL (ref 6.0–8.3)

## 2021-04-24 LAB — BASIC METABOLIC PANEL
BUN: 17 mg/dL (ref 6–23)
CO2: 28 mEq/L (ref 19–32)
Calcium: 9.6 mg/dL (ref 8.4–10.5)
Chloride: 105 mEq/L (ref 96–112)
Creatinine, Ser: 0.65 mg/dL (ref 0.40–1.20)
GFR: 88.57 mL/min (ref >60.00)
Glucose, Bld: 173 mg/dL — ABNORMAL HIGH (ref 70–99)
Potassium: 4.1 mEq/L (ref 3.5–5.1)
Sodium: 141 mEq/L (ref 135–145)

## 2021-04-24 LAB — MICROALBUMIN / CREATININE URINE RATIO
Creatinine,U: 15.4 mg/dL
Microalb Creat Ratio: 4.5 mg/g (ref 0.0–30.0)
Microalb, Ur: <0.7 mg/dL (ref 0.0–1.9)

## 2021-04-24 LAB — HEMOGLOBIN A1C: Hgb A1c MFr Bld: 7.4 % — ABNORMAL HIGH (ref 4.6–6.5)

## 2021-04-25 ENCOUNTER — Other Ambulatory Visit: Payer: Self-pay

## 2021-04-25 ENCOUNTER — Ambulatory Visit (INDEPENDENT_AMBULATORY_CARE_PROVIDER_SITE_OTHER): Payer: PPO | Admitting: Internal Medicine

## 2021-04-25 ENCOUNTER — Ambulatory Visit (INDEPENDENT_AMBULATORY_CARE_PROVIDER_SITE_OTHER): Payer: PPO

## 2021-04-25 DIAGNOSIS — Z8673 Personal history of transient ischemic attack (TIA), and cerebral infarction without residual deficits: Secondary | ICD-10-CM | POA: Diagnosis not present

## 2021-04-25 DIAGNOSIS — R195 Other fecal abnormalities: Secondary | ICD-10-CM | POA: Diagnosis not present

## 2021-04-25 DIAGNOSIS — Z86010 Personal history of colonic polyps: Secondary | ICD-10-CM | POA: Diagnosis not present

## 2021-04-25 DIAGNOSIS — M25532 Pain in left wrist: Secondary | ICD-10-CM | POA: Diagnosis not present

## 2021-04-25 DIAGNOSIS — S6992XA Unspecified injury of left wrist, hand and finger(s), initial encounter: Secondary | ICD-10-CM | POA: Diagnosis not present

## 2021-04-25 DIAGNOSIS — D696 Thrombocytopenia, unspecified: Secondary | ICD-10-CM

## 2021-04-25 DIAGNOSIS — E1159 Type 2 diabetes mellitus with other circulatory complications: Secondary | ICD-10-CM

## 2021-04-25 DIAGNOSIS — E7800 Pure hypercholesterolemia, unspecified: Secondary | ICD-10-CM

## 2021-04-25 NOTE — Progress Notes (Signed)
[name], female   DOB: [birth year], 70 y.o.   MRN: [id number]   Subjective:    [name], female    DOB: 05/10/1951, 70 y.o.   MRN: [id number]  This visit occurred during the SARS-CoV-2 public health emergency.  Safety protocols were in place, including screening questions prior to the visit, additional usage of staff PPE, and extensive cleaning of exam room while observing appropriate contact time as indicated for disinfecting solutions.   Patient here for a scheduled follow up.    HPI Here to follow up regarding her blood sugars and cholesterol.  Also has a history of diarrhea/loose stools.  Colonoscopy 11/2019.  Seeing GI.  On colestid.  Helping.  Symptoms improved.  No chest pain or sob reported.  No abdominal pain.  Seeing urology - [facility] - recommended myrbetriq - pelvic wall PT.  Fell 04/22/21 - foot caught.  She was not watching where she was walking.  No LOC.  Face hit trash can.  No residual headache or dizziness.  Hit left wrist, shoulder hip and knee.  Residual pain - left wrist.  No significant pain or concerns - since fall.     Past Medical History:  Diagnosis Date   Colon polyps    hyperplastic   Diabetes mellitus without complication ([place])    Diverticulosis    Hx of adenomatous colonic polyps    tubular adenoma   Hypercholesterolemia    Serrated adenoma of colon    Stroke (cerebrum) ([place])    Thrombocytopenia ([place])    Uterus descensus    Past Surgical History:  Procedure Laterality Date   ABDOMINAL HYSTERECTOMY  2009   CHOLECYSTECTOMY  2007   COLONOSCOPY N/A 04/05/2015   Procedure: COLONOSCOPY;  Surgeon: Lollie Sails, MD;  Location: [facility] ENDOSCOPY;  Service: Endoscopy;  Laterality: N/A;   COLONOSCOPY WITH PROPOFOL N/A 10/09/2016   Procedure: COLONOSCOPY WITH PROPOFOL;  Surgeon: Lollie Sails, MD;  Location: [facility] ENDOSCOPY;  Service: Endoscopy;  Laterality: N/A;   COLONOSCOPY WITH PROPOFOL N/A 12/02/2019    Procedure: COLONOSCOPY WITH PROPOFOL;  Surgeon: Toledo, Benay Pike, MD;  Location: [facility] ENDOSCOPY;  Service: Gastroenterology;  Laterality: N/A;   DILATION AND CURETTAGE OF UTERUS  2001 and 2006   FOOT SURGERY  2007   TONSILLECTOMY  1960   TUBAL LIGATION     vocal cord cyst removed     Family History  Problem Relation Age of Onset   Colon cancer Mother    Diabetes Mother    Kidney disease Mother    Heart disease Mother    Prostate cancer Father    Diabetes Father    Glaucoma Father    Uterine cancer Maternal Aunt    Breast cancer Paternal Aunt        x2   Stroke Paternal Grandmother    Stroke Paternal Grandfather    Diabetes Brother    Diabetes Brother    Diabetes Other    Social History   Socioeconomic History   Marital status: Married    Spouse name: Not on file   Number of children: Not on file   Years of education: Not on file   Highest education level: Not on file  Occupational History   Not on file  Tobacco Use   Smoking status: Never   Smokeless tobacco: Never  Vaping Use   Vaping Use: Never used  Substance and Sexual Activity   Alcohol use: No    Alcohol/week: 0.0 standard  drinks   Drug use: No   Sexual activity: Not Currently  Other Topics Concern   Not on file  Social History Narrative   Not on file   Social Determinants of Health   Financial Resource Strain: Low Risk    Difficulty of Paying Living Expenses: Not hard at all  Food Insecurity: No Food Insecurity   Worried About Charity fundraiser in the Last Year: Never true   [place] in the Last Year: Never true  Transportation Needs: No Transportation Needs   Lack of Transportation (Medical): No   Lack of Transportation (Non-Medical): No  Physical Activity: Insufficiently Active   Days of Exercise per Week: 3 days   Minutes of Exercise per Session: 20 min  Stress: No Stress Concern Present   Feeling of Stress : Not at all  Social Connections: Unknown   Frequency of Communication with  Friends and Family: Not on file   Frequency of Social Gatherings with Friends and Family: Not on file   Attends Religious Services: Not on file   Active Member of Clubs or Organizations: Not on file   Attends Archivist Meetings: Not on file   Marital Status: Married     Review of Systems  Constitutional:  Negative for appetite change and unexpected weight change.  HENT:  Negative for congestion and sinus pressure.   Respiratory:  Negative for cough, chest tightness and shortness of breath.   Cardiovascular:  Negative for chest pain, palpitations and leg swelling.  Gastrointestinal:  Negative for abdominal pain, diarrhea, nausea and vomiting.  Genitourinary:  Negative for difficulty urinating and dysuria.  Musculoskeletal:  Negative for joint swelling and myalgias.       Persistent left wrist pain.   Skin:  Negative for color change and rash.  Neurological:  Negative for dizziness, light-headedness and headaches.  Psychiatric/Behavioral:  Negative for agitation and dysphoric mood.       Objective:     BP 130/74   Pulse 75   Temp 97.6 F (36.4 C)   Resp 16   Ht 5\' 5"  (1.651 m)   Wt 174 lb 12.8 oz (79.3 kg)   SpO2 98%   BMI 29.09 kg/m  Wt Readings from Last 3 Encounters:  04/26/21 174 lb (78.9 kg)  04/25/21 174 lb 12.8 oz (79.3 kg)  01/17/21 177 lb 4.8 oz (80.4 kg)    Physical Exam Vitals reviewed.  Constitutional:      General: She is not in acute distress.    Appearance: Normal appearance.  HENT:     Head: Normocephalic and atraumatic.     Right Ear: External ear normal.     Left Ear: External ear normal.  Eyes:     General: No scleral icterus.       Right eye: No discharge.        Left eye: No discharge.     Conjunctiva/sclera: Conjunctivae normal.  Neck:     Thyroid: No thyromegaly.  Cardiovascular:     Rate and Rhythm: Normal rate and regular rhythm.  Pulmonary:     Effort: No respiratory distress.     Breath sounds: Normal breath sounds. No  wheezing.  Abdominal:     General: Bowel sounds are normal.     Palpations: Abdomen is soft.     Tenderness: There is no abdominal tenderness.  Musculoskeletal:     Cervical back: Neck supple. No tenderness.     Comments: Slight increased soft tissue swelling -  left wrist.  Good rom.  Minimal pain to palpation. No increased erythema.   Lymphadenopathy:     Cervical: No cervical adenopathy.  Skin:    Findings: No erythema or rash.  Neurological:     Mental Status: She is alert.  Psychiatric:        Mood and Affect: Mood normal.        Behavior: Behavior normal.     Outpatient Encounter Medications as of 04/25/2021  Medication Sig   aspirin 81 MG tablet Take 81 mg by mouth daily.   Cholecalciferol 25 MCG (1000 UT) tablet Take 1,000 Units by mouth daily.    colestipol (COLESTID) 1 g tablet Take 1 g by mouth daily.    dorzolamide-timolol (COSOPT) 22.3-6.8 MG/ML ophthalmic solution Place 1 drop into both eyes 2 (two) times daily.   metFORMIN (GLUCOPHAGE-XR) 500 MG 24 hr tablet Take 1 tablet (500 mg total) by mouth 2 (two) times daily before a meal.   mirabegron ER (MYRBETRIQ) 25 MG TB24 tablet Take 1 tablet (25 mg total) by mouth daily.   Multiple Vitamins-Minerals (MULTIVITAMIN ADULT PO) Take by mouth daily.   ONETOUCH VERIO test strip USE AS INSTRUCTED TO CHECK BLOOD SUGARS DAILY. DX E11.9   Probiotic Product (PROBIOTIC DAILY) CAPS Take 1 capsule by mouth daily.   rosuvastatin (CRESTOR) 5 MG tablet Take 1 tablet (5 mg total) by mouth daily.   TRAVATAN Z 0.004 % SOLN ophthalmic solution Place 1 drop into both eyes at bedtime.   triamcinolone ointment (KENALOG) 0.5 % Apply 1 application topically 2 (two) times daily. For itching.   No facility-administered encounter medications on file as of 04/25/2021.     Lab Results  Component Value Date   WBC 5.3 12/13/2020   HGB 14.2 12/13/2020   HCT 41.7 12/13/2020   PLT 154.0 12/13/2020   GLUCOSE 173 (H) 04/24/2021   CHOL 187 04/24/2021    TRIG 189.0 (H) 04/24/2021   HDL 52.60 04/24/2021   LDLCALC 97 04/24/2021   ALT 20 04/24/2021   AST 19 04/24/2021   NA 141 04/24/2021   K 4.1 04/24/2021   CL 105 04/24/2021   CREATININE 0.65 04/24/2021   BUN 17 04/24/2021   CO2 28 04/24/2021   TSH 1.74 08/29/2020   HGBA1C 7.4 (H) 04/24/2021   MICROALBUR <0.7 04/24/2021    MM 3D SCREEN BREAST BILATERAL  Result Date: 12/14/2020 CLINICAL DATA:  Screening. EXAM: DIGITAL SCREENING BILATERAL MAMMOGRAM WITH TOMOSYNTHESIS AND CAD TECHNIQUE: Bilateral screening digital craniocaudal and mediolateral oblique mammograms were obtained. Bilateral screening digital breast tomosynthesis was performed. The images were evaluated with computer-aided detection. COMPARISON:  Previous exam(s). ACR Breast Density Category b: There are scattered areas of fibroglandular density. FINDINGS: There are no findings suspicious for malignancy. IMPRESSION: No mammographic evidence of malignancy. A result letter of this screening mammogram will be mailed directly to the patient. RECOMMENDATION: Screening mammogram in one year. (Code:SM-B-01Y) BI-RADS CATEGORY  1: Negative. Electronically Signed   By: Margarette Canada M.D.   On: 12/14/2020 12:19      Assessment & Plan:   Problem List Items Addressed This Visit     History of colonic polyps    Colonoscopy 11/2019 - diverticulosis.  Recommended f/u colonoscopy in 5 years.       History of CVA (cerebrovascular accident)    Continue aspirin.       Hypercholesteremia    Continue crestor.  Low cholesterol diet and exercise.  Follow lipid panel and liver function tests.  Relevant Orders   Hepatic function panel   Lipid panel   TSH   Left wrist pain    Persistent pain s/p fall.  Check xray.  Further w/up pending results.        Relevant Orders   DG Wrist 2 Views Left (Completed)   Loose stools    Better.  Off metformin.  On colestid.  Colonoscopy 11/2019.        Thrombocytopenia ([place])    Follow cbc.        Relevant Orders   CBC with Differential/Platelet   Type 2 diabetes mellitus with vascular disease ([place])    Off metformin.  Bowels doing better off.  Low carb diet and exercise.  Recent a1c increased 7.4.  Discussed medication treatment options.  She desires to work on diet and exercise before adding additional medication.  Discussed if persistent elevation, will require medication.        Relevant Orders   Hemoglobin L8H   Basic metabolic panel     Einar Pheasant, MD

## 2021-04-26 ENCOUNTER — Ambulatory Visit (INDEPENDENT_AMBULATORY_CARE_PROVIDER_SITE_OTHER): Payer: PPO

## 2021-04-26 VITALS — Ht 65.0 in | Wt 174.0 lb

## 2021-04-26 DIAGNOSIS — Z0000 Encounter for general adult medical examination without abnormal findings: Secondary | ICD-10-CM

## 2021-04-26 NOTE — Progress Notes (Signed)
Subjective:   Victoria Harris is a 70 y.o. female who presents for Medicare Annual (Subsequent) preventive examination.  Review of Systems    No ROS.  Medicare Wellness Virtual Visit.  Visual/audio telehealth visit, UTA vital signs.   See social history for additional risk factors.   Cardiac Risk Factors include: advanced age (>5men, >52 women);diabetes mellitus     Objective:    Today's Vitals   04/26/21 1004  Weight: 174 lb (78.9 kg)  Height: 5\' 5"  (1.651 m)   Body mass index is 28.96 kg/m.  Advanced Directives 04/26/2021 04/25/2020 03/15/2020 12/02/2019 04/21/2019 04/16/2018 04/02/2017  Does Patient Have a Medical Advance Directive? Yes Yes No Yes Yes Yes Yes  Type of Paramedic of [place];Living will [place];Living will - [place];Living will [place];Living will [place];Living will Living will;Healthcare Power of Attorney  Does patient want to make changes to medical advance directive? No - Patient declined No - Patient declined - - No - Patient declined No - Patient declined No - Patient declined  Copy of [place] in Chart? No - copy requested No - copy requested - No - copy requested No - copy requested No - copy requested No - copy requested  Would patient like information on creating a medical advance directive? - - No - Patient declined - - - -    Current Medications (verified) Outpatient Encounter Medications as of 04/26/2021  Medication Sig   aspirin 81 MG tablet Take 81 mg by mouth daily.   Cholecalciferol 25 MCG (1000 UT) tablet Take 1,000 Units by mouth daily.    colestipol (COLESTID) 1 g tablet Take 1 g by mouth daily.    dorzolamide-timolol (COSOPT) 22.3-6.8 MG/ML ophthalmic solution Place 1 drop into both eyes 2 (two) times daily.   metFORMIN (GLUCOPHAGE-XR) 500 MG 24 hr tablet Take 1 tablet (500 mg total) by mouth 2 (two) times  daily before a meal.   mirabegron ER (MYRBETRIQ) 25 MG TB24 tablet Take 1 tablet (25 mg total) by mouth daily.   Multiple Vitamins-Minerals (MULTIVITAMIN ADULT PO) Take by mouth daily.   ONETOUCH VERIO test strip USE AS INSTRUCTED TO CHECK BLOOD SUGARS DAILY. DX E11.9   Probiotic Product (PROBIOTIC DAILY) CAPS Take 1 capsule by mouth daily.   rosuvastatin (CRESTOR) 5 MG tablet Take 1 tablet (5 mg total) by mouth daily.   TRAVATAN Z 0.004 % SOLN ophthalmic solution Place 1 drop into both eyes at bedtime.   triamcinolone ointment (KENALOG) 0.5 % Apply 1 application topically 2 (two) times daily. For itching.   No facility-administered encounter medications on file as of 04/26/2021.    Allergies (verified) No known drug allergy   History: Past Medical History:  Diagnosis Date   Colon polyps    hyperplastic   Diabetes mellitus without complication ([place])    Diverticulosis    Hx of adenomatous colonic polyps    tubular adenoma   Hypercholesterolemia    Serrated adenoma of colon    Stroke (cerebrum) ([place])    Thrombocytopenia ([place])    Uterus descensus    Past Surgical History:  Procedure Laterality Date   ABDOMINAL HYSTERECTOMY  2009   CHOLECYSTECTOMY  2007   COLONOSCOPY N/A 04/05/2015   Procedure: COLONOSCOPY;  Surgeon: Lollie Sails, MD;  Location: [facility] ENDOSCOPY;  Service: Endoscopy;  Laterality: N/A;   COLONOSCOPY WITH PROPOFOL N/A 10/09/2016   Procedure: COLONOSCOPY WITH PROPOFOL;  Surgeon:  Lollie Sails, MD;  Location: [facility] ENDOSCOPY;  Service: Endoscopy;  Laterality: N/A;   COLONOSCOPY WITH PROPOFOL N/A 12/02/2019   Procedure: COLONOSCOPY WITH PROPOFOL;  Surgeon: Toledo, Benay Pike, MD;  Location: [facility] ENDOSCOPY;  Service: Gastroenterology;  Laterality: N/A;   DILATION AND CURETTAGE OF UTERUS  2001 and 2006   FOOT SURGERY  2007   TONSILLECTOMY  1960   TUBAL LIGATION     vocal cord cyst removed     Family History  Problem Relation Age of Onset   Colon cancer Mother     Diabetes Mother    Kidney disease Mother    Heart disease Mother    Prostate cancer Father    Diabetes Father    Glaucoma Father    Uterine cancer Maternal Aunt    Breast cancer Paternal Aunt        x2   Stroke Paternal Grandmother    Stroke Paternal Grandfather    Diabetes Brother    Diabetes Brother    Diabetes Other    Social History   Socioeconomic History   Marital status: Married    Spouse name: Not on file   Number of children: Not on file   Years of education: Not on file   Highest education level: Not on file  Occupational History   Not on file  Tobacco Use   Smoking status: Never   Smokeless tobacco: Never  Vaping Use   Vaping Use: Never used  Substance and Sexual Activity   Alcohol use: No    Alcohol/week: 0.0 standard drinks   Drug use: No   Sexual activity: Not Currently  Other Topics Concern   Not on file  Social History Narrative   Not on file   Social Determinants of Health   Financial Resource Strain: Low Risk    Difficulty of Paying Living Expenses: Not hard at all  Food Insecurity: No Food Insecurity   Worried About Charity fundraiser in the Last Year: Never true   [place] in the Last Year: Never true  Transportation Needs: No Transportation Needs   Lack of Transportation (Medical): No   Lack of Transportation (Non-Medical): No  Physical Activity: Insufficiently Active   Days of Exercise per Week: 3 days   Minutes of Exercise per Session: 20 min  Stress: No Stress Concern Present   Feeling of Stress : Not at all  Social Connections: Unknown   Frequency of Communication with Friends and Family: Not on file   Frequency of Social Gatherings with Friends and Family: Not on file   Attends Religious Services: Not on Electrical engineer or Organizations: Not on file   Attends Archivist Meetings: Not on file   Marital Status: Married    Tobacco Counseling Counseling given: Not Answered   Clinical  Intake:  Pre-visit preparation completed: Yes        Diabetes: Yes (Followed by pcp)  How often do you need to have someone help you when you read instructions, pamphlets, or other written materials from your doctor or pharmacy?: 1 - Never  Nutrition Risk Assessment: Has the patient had any N/V/D within the last 2 months?  No  Does the patient have any non-healing wounds?  No  Has the patient had any unintentional weight loss or weight gain?  No   Diabetes: How often do you monitor your CBG's? Daily.   Financial Strains and Diabetes Management: Are you having any financial strains  with the device, your supplies or your medication? No .  Does the patient want to be seen by Chronic Care Management for management of their diabetes?  No  Would the patient like to be referred to a Nutritionist or for Diabetic Management?  No     Interpreter Needed?: No      Activities of Daily Living In your present state of health, do you have any difficulty performing the following activities: 04/26/2021  Hearing? N  Vision? N  Difficulty concentrating or making decisions? N  Walking or climbing stairs? N  Dressing or bathing? N  Doing errands, shopping? N  Preparing Food and eating ? N  Using the Toilet? N  In the past six months, have you accidently leaked urine? Y  Comment Followed by Urology. Wears daily pad.  Do you have problems with loss of bowel control? N  Managing your Medications? N  Managing your Finances? N  Housekeeping or managing your Housekeeping? N  Some recent data might be hidden    Patient Care Team: Einar Pheasant, MD as PCP - General (Internal Medicine)  Indicate any recent Medical Services you may have received from other than [facility] providers in the past year (date may be approximate).     Assessment:   This is a routine wellness examination for [place].  Virtual Visit via Telephone Note  I connected with  Darl Pikes on 04/26/21 at  9:45  AM EST by telephone and verified that I am speaking with the correct person using two identifiers.  Location: Patient: home Provider: office Persons participating in the virtual visit: patient/Nurse Health Advisor   I discussed the limitations, risks, security and privacy concerns of performing an evaluation and management service by telephone and the availability of in person appointments. The patient expressed understanding and agreed to proceed.  Interactive audio and video telecommunications were attempted between this nurse and patient, however failed, due to patient having technical difficulties OR patient did not have access to video capability.  We continued and completed visit with audio only.  Some vital signs may be absent or patient reported.   Hearing/Vision screen Hearing Screening - Comments:: Patient is able to hear conversational tones without difficulty. No issues reported. Vision Screening - Comments:: Followed by [facility]  Wears corrective lenses  OV every 6 months; glaucoma; drops in use  They have regular follow up with the ophthalmologist  Dietary issues and exercise activities discussed: Current Exercise Habits: Home exercise routine, Type of exercise: walking, Intensity: Mild   Goals Addressed               This Visit's Progress     Patient Stated     Increase physical activity (pt-stated)        Walk for exercise       Depression Screen PHQ 2/9 Scores 04/26/2021 12/14/2020 04/25/2020 03/15/2020 04/21/2019 10/07/2018 04/16/2018  PHQ - 2 Score 0 0 0 0 0 0 0  PHQ- 9 Score - - - - - - -    Fall Risk Fall Risk  04/26/2021 12/14/2020 09/23/2020 06/24/2020 04/25/2020  Falls in the past year? 1 0 0 0 1  Comment - - - - no falls since last week's visit 11/11  Number falls in past yr: - 0 0 0 -  Injury with Fall? - 0 0 0 -  Follow up - Falls evaluation completed Falls evaluation completed Falls evaluation completed Falls evaluation completed     [place]  TO THE HOME: Home free of loose throw rugs in walkways, pet beds, electrical cords, etc? Yes  Adequate lighting in your home to reduce risk of falls? Yes   ASSISTIVE DEVICES UTILIZED TO PREVENT FALLS: Life alert? No  Use of a cane, walker or w/c? No   TIMED UP AND GO: Was the test performed? No .    Cognitive Function: MMSE - Mini Mental State Exam 04/16/2018 04/02/2017  Orientation to time 5 5  Orientation to Place 5 5  Registration 3 3  Attention/ Calculation 5 5  Recall 3 3  Language- name 2 objects 2 2  Language- repeat 1 1  Language- follow 3 step command 3 3  Language- read & follow direction 1 1  Write a sentence 1 1  Copy design 1 1  Total score 30 30     6CIT Screen 04/26/2021 04/21/2019  What Year? 0 points 0 points  What month? 0 points 0 points  What time? 0 points 0 points  Count back from 20 - 0 points  Months in reverse 0 points 0 points  Repeat phrase - 0 points  Total Score - 0    Immunizations Immunization History  Administered Date(s) Administered   Fluad Quad(high Dose 65+) 04/21/2019, 04/29/2020   Influenza, High Dose Seasonal PF 03/23/2016, 04/04/2017, 04/18/2018   Influenza,inj,Quad PF,6+ Mos 05/25/2015   Pneumococcal Conjugate-13 07/19/2015   Pneumococcal Polysaccharide-23 11/29/2016   Zoster Recombinat (Shingrix) 04/14/2021   Zoster, Live 09/17/2013   Flu Vaccine status: Due, Education has been provided regarding the importance of this vaccine. Advised may receive this vaccine at local pharmacy or Health Dept. Aware to provide a copy of the vaccination record if obtained from local pharmacy or Health Dept. Verbalized acceptance and understanding. Deferred.   Tdap-  Due, Education has been provided regarding the importance of this vaccine. Advised may receive this vaccine at local pharmacy or Health Dept. Aware to provide a copy of the vaccination record if obtained from local pharmacy or Health Dept.  Verbalized acceptance and understanding. Deferred.   Screening Tests Health Maintenance  Topic Date Due   OPHTHALMOLOGY EXAM  04/19/2021   COVID-19 Vaccine (1) 05/11/2021 (Originally 05/15/1951)   INFLUENZA VACCINE  08/25/2021 (Originally 12/26/2020)   TETANUS/TDAP  04/26/2022 (Originally 11/13/1969)   FOOT EXAM  04/29/2021   Zoster Vaccines- Shingrix (2 of 2) 06/09/2021   HEMOGLOBIN A1C  10/22/2021   MAMMOGRAM  12/13/2021   URINE MICROALBUMIN  04/24/2022   COLONOSCOPY (Pts 45-61yrs Insurance coverage will need to be confirmed)  12/01/2029   Pneumonia Vaccine 46+ Years old  Completed   DEXA SCAN  Completed   Hepatitis C Screening  Completed   HPV VACCINES  Aged Out   Health Maintenance Health Maintenance Due  Topic Date Due   OPHTHALMOLOGY EXAM  04/19/2021   Lung Cancer Screening: (Low Dose CT Chest recommended if Age 52-80 years, 30 pack-year currently smoking OR have quit w/in 15years.) does not qualify.   Vision Screening: Recommended annual ophthalmology exams for early detection of glaucoma and other disorders of the eye.  Dental Screening: Recommended annual dental exams for proper oral hygiene  Community Resource Referral / Chronic Care Management: CRR required this visit?  No   CCM required this visit?  No      Plan:   Keep all routine maintenance appointments.   I have personally reviewed and noted the following in the patient's chart:   Medical and social history Use of alcohol, tobacco or illicit  drugs  Current medications and supplements including opioid prescriptions. Not taking opioid.  Functional ability and status Nutritional status Physical activity Advanced directives List of other physicians Hospitalizations, surgeries, and ER visits in previous 12 months Vitals Screenings to include cognitive, depression, and falls Referrals and appointments  In addition, I have reviewed and discussed with patient certain preventive protocols, quality metrics,  and best practice recommendations. A written personalized care plan for preventive services as well as general preventive health recommendations were provided to patient.     Varney Biles, LPN   [id number]/71/[id number]

## 2021-04-26 NOTE — Patient Instructions (Addendum)
[name] , Thank you for taking time to come for your Medicare Wellness Visit. I appreciate your ongoing commitment to your health goals. Please review the following plan we discussed and let me know if I can assist you in the future.   These are the goals we discussed:  Goals       Patient Stated     Increase physical activity (pt-stated)      Walk for exercise        This is a list of the screening recommended for you and due dates:  Health Maintenance  Topic Date Due   Eye exam for diabetics  04/19/2021   COVID-19 Vaccine (1) 05/11/2021*   Flu Shot  08/25/2021*   Tetanus Vaccine  04/26/2022*   Complete foot exam   04/29/2021   Zoster (Shingles) Vaccine (2 of 2) 06/09/2021   Hemoglobin A1C  10/22/2021   Mammogram  12/13/2021   Urine Protein Check  04/24/2022   Colon Cancer Screening  12/01/2029   Pneumonia Vaccine  Completed   DEXA scan (bone density measurement)  Completed   Hepatitis C Screening: USPSTF Recommendation to screen - Ages 52-79 yo.  Completed   HPV Vaccine  Aged Out  *Topic was postponed. The date shown is not the original due date.    Advanced directives: End of life planning; Advance aging; Advanced directives discussed.  Copy of current HCPOA/Living Will requested.    Conditions/risks identified: none new  Follow up in one year for your annual wellness visit    Preventive Care 65 Years and Older, Female Preventive care refers to lifestyle choices and visits with your health care provider that can promote health and wellness. What does preventive care include? A yearly physical exam. This is also called an annual well check. Dental exams once or twice a year. Routine eye exams. Ask your health care provider how often you should have your eyes checked. Personal lifestyle choices, including: Daily care of your teeth and gums. Regular physical activity. Eating a healthy diet. Avoiding tobacco and drug use. Limiting alcohol use. Practicing safe  sex. Taking low-dose aspirin every day. Taking vitamin and mineral supplements as recommended by your health care provider. What happens during an annual well check? The services and screenings done by your health care provider during your annual well check will depend on your age, overall health, lifestyle risk factors, and family history of disease. Counseling  Your health care provider may ask you questions about your: Alcohol use. Tobacco use. Drug use. Emotional well-being. Home and relationship well-being. Sexual activity. Eating habits. History of falls. Memory and ability to understand (cognition). Work and work Statistician. Reproductive health. Screening  You may have the following tests or measurements: Height, weight, and BMI. Blood pressure. Lipid and cholesterol levels. These may be checked every 5 years, or more frequently if you are over 64 years old. Skin check. Lung cancer screening. You may have this screening every year starting at age 71 if you have a 30-pack-year history of smoking and currently smoke or have quit within the past 15 years. Fecal occult blood test (FOBT) of the stool. You may have this test every year starting at age 66. Flexible sigmoidoscopy or colonoscopy. You may have a sigmoidoscopy every 5 years or a colonoscopy every 10 years starting at age 34. Hepatitis C blood test. Hepatitis B blood test. Sexually transmitted disease (STD) testing. Diabetes screening. This is done by checking your blood sugar (glucose) after you have not eaten for  a while (fasting). You may have this done every 1-3 years. Bone density scan. This is done to screen for osteoporosis. You may have this done starting at age 27. Mammogram. This may be done every 1-2 years. Talk to your health care provider about how often you should have regular mammograms. Talk with your health care provider about your test results, treatment options, and if necessary, the need for more  tests. Vaccines  Your health care provider may recommend certain vaccines, such as: Influenza vaccine. This is recommended every year. Tetanus, diphtheria, and acellular pertussis (Tdap, Td) vaccine. You may need a Td booster every 10 years. Zoster vaccine. You may need this after age 21. Pneumococcal 13-valent conjugate (PCV13) vaccine. One dose is recommended after age 61. Pneumococcal polysaccharide (PPSV23) vaccine. One dose is recommended after age 6. Talk to your health care provider about which screenings and vaccines you need and how often you need them. This information is not intended to replace advice given to you by your health care provider. Make sure you discuss any questions you have with your health care provider. Document Released: 06/10/2015 Document Revised: 02/01/2016 Document Reviewed: 03/15/2015 Elsevier Interactive Patient Education  2017 [place] Prevention in the Home Falls can cause injuries. They can happen to people of all ages. There are many things you can do to make your home safe and to help prevent falls. What can I do on the outside of my home? Regularly fix the edges of walkways and driveways and fix any cracks. Remove anything that might make you trip as you walk through a door, such as a raised step or threshold. Trim any bushes or trees on the path to your home. Use bright outdoor lighting. Clear any walking paths of anything that might make someone trip, such as rocks or tools. Regularly check to see if handrails are loose or broken. Make sure that both sides of any steps have handrails. Any raised decks and porches should have guardrails on the edges. Have any leaves, snow, or ice cleared regularly. Use sand or salt on walking paths during winter. Clean up any spills in your garage right away. This includes oil or grease spills. What can I do in the bathroom? Use night lights. Install grab bars by the toilet and in the tub and shower.  Do not use towel bars as grab bars. Use non-skid mats or decals in the tub or shower. If you need to sit down in the shower, use a plastic, non-slip stool. Keep the floor dry. Clean up any water that spills on the floor as soon as it happens. Remove soap buildup in the tub or shower regularly. Attach bath mats securely with double-sided non-slip rug tape. Do not have throw rugs and other things on the floor that can make you trip. What can I do in the bedroom? Use night lights. Make sure that you have a light by your bed that is easy to reach. Do not use any sheets or blankets that are too big for your bed. They should not hang down onto the floor. Have a firm chair that has side arms. You can use this for support while you get dressed. Do not have throw rugs and other things on the floor that can make you trip. What can I do in the kitchen? Clean up any spills right away. Avoid walking on wet floors. Keep items that you use a lot in easy-to-reach places. If you need to reach something above  you, use a strong step stool that has a grab bar. Keep electrical cords out of the way. Do not use floor polish or wax that makes floors slippery. If you must use wax, use non-skid floor wax. Do not have throw rugs and other things on the floor that can make you trip. What can I do with my stairs? Do not leave any items on the stairs. Make sure that there are handrails on both sides of the stairs and use them. Fix handrails that are broken or loose. Make sure that handrails are as long as the stairways. Check any carpeting to make sure that it is firmly attached to the stairs. Fix any carpet that is loose or worn. Avoid having throw rugs at the top or bottom of the stairs. If you do have throw rugs, attach them to the floor with carpet tape. Make sure that you have a light switch at the top of the stairs and the bottom of the stairs. If you do not have them, ask someone to add them for you. What else  can I do to help prevent falls? Wear shoes that: Do not have high heels. Have rubber bottoms. Are comfortable and fit you well. Are closed at the toe. Do not wear sandals. If you use a stepladder: Make sure that it is fully opened. Do not climb a closed stepladder. Make sure that both sides of the stepladder are locked into place. Ask someone to hold it for you, if possible. Clearly mark and make sure that you can see: Any grab bars or handrails. First and last steps. Where the edge of each step is. Use tools that help you move around (mobility aids) if they are needed. These include: Canes. Walkers. Scooters. Crutches. Turn on the lights when you go into a dark area. Replace any light bulbs as soon as they burn out. Set up your furniture so you have a clear path. Avoid moving your furniture around. If any of your floors are uneven, fix them. If there are any pets around you, be aware of where they are. Review your medicines with your doctor. Some medicines can make you feel dizzy. This can increase your chance of falling. Ask your doctor what other things that you can do to help prevent falls. This information is not intended to replace advice given to you by your health care provider. Make sure you discuss any questions you have with your health care provider. Document Released: 03/10/2009 Document Revised: 10/20/2015 Document Reviewed: 06/18/2014 Elsevier Interactive Patient Education  2017 [employer].

## 2021-04-27 ENCOUNTER — Telehealth: Payer: Self-pay | Admitting: Internal Medicine

## 2021-04-27 NOTE — Telephone Encounter (Signed)
Patient called about x-ray results.

## 2021-04-30 ENCOUNTER — Encounter: Payer: Self-pay | Admitting: Internal Medicine

## 2021-04-30 NOTE — Assessment & Plan Note (Signed)
Off metformin.  Bowels doing better off.  Low carb diet and exercise.  Recent a1c increased 7.4.  Discussed medication treatment options.  She desires to work on diet and exercise before adding additional medication.  Discussed if persistent elevation, will require medication.

## 2021-04-30 NOTE — Assessment & Plan Note (Signed)
Continue aspirin 

## 2021-04-30 NOTE — Assessment & Plan Note (Signed)
Colonoscopy 11/2019 - diverticulosis.  Recommended f/u colonoscopy in 5 years.  

## 2021-04-30 NOTE — Assessment & Plan Note (Signed)
Continue crestor.  Low cholesterol diet and exercise. Follow lipid panel and liver function tests.   

## 2021-04-30 NOTE — Assessment & Plan Note (Signed)
Better.  Off metformin.  On colestid.  Colonoscopy 11/2019.

## 2021-04-30 NOTE — Assessment & Plan Note (Signed)
Persistent pain s/p fall.  Check xray.  Further w/up pending results.

## 2021-04-30 NOTE — Assessment & Plan Note (Signed)
Follow cbc.  

## 2021-05-02 ENCOUNTER — Ambulatory Visit: Payer: PPO | Attending: Obstetrics and Gynecology | Admitting: Physical Therapy

## 2021-05-02 ENCOUNTER — Encounter: Payer: Self-pay | Admitting: Physical Therapy

## 2021-05-02 ENCOUNTER — Other Ambulatory Visit: Payer: Self-pay

## 2021-05-02 DIAGNOSIS — R279 Unspecified lack of coordination: Secondary | ICD-10-CM | POA: Insufficient documentation

## 2021-05-02 DIAGNOSIS — R293 Abnormal posture: Secondary | ICD-10-CM | POA: Diagnosis not present

## 2021-05-02 DIAGNOSIS — M6281 Muscle weakness (generalized): Secondary | ICD-10-CM | POA: Insufficient documentation

## 2021-05-02 NOTE — Therapy (Signed)
[facility] [address] [place], [place], [address] Phone: [phone]   Fax:  [phone]  Physical Therapy Treatment  Patient Details  Name: Victoria Harris MRN: [id number] Date of Birth: 02-Oct-1950 Referring Provider (PT): Sherlene Shams   Encounter Date: 05/02/2021   PT End of Session - 05/02/21 1047     Visit Number 9    Number of Visits 12    Date for PT Re-Evaluation 05/09/21    Authorization Type IE: 02/14/2021    PT Start Time 1030    PT Stop Time 1110    PT Time Calculation (min) 40 min    Activity Tolerance Patient tolerated treatment well    Behavior During Therapy [facility] for tasks assessed/performed             Past Medical History:  Diagnosis Date   Colon polyps    hyperplastic   Diabetes mellitus without complication ([place])    Diverticulosis    Hx of adenomatous colonic polyps    tubular adenoma   Hypercholesterolemia    Serrated adenoma of colon    Stroke (cerebrum) ([place])    Thrombocytopenia ([place])    Uterus descensus     Past Surgical History:  Procedure Laterality Date   ABDOMINAL HYSTERECTOMY  2009   CHOLECYSTECTOMY  2007   COLONOSCOPY N/A 04/05/2015   Procedure: COLONOSCOPY;  Surgeon: Lollie Sails, MD;  Location: [facility] ENDOSCOPY;  Service: Endoscopy;  Laterality: N/A;   COLONOSCOPY WITH PROPOFOL N/A 10/09/2016   Procedure: COLONOSCOPY WITH PROPOFOL;  Surgeon: Lollie Sails, MD;  Location: [facility] ENDOSCOPY;  Service: Endoscopy;  Laterality: N/A;   COLONOSCOPY WITH PROPOFOL N/A 12/02/2019   Procedure: COLONOSCOPY WITH PROPOFOL;  Surgeon: Toledo, Benay Pike, MD;  Location: [facility] ENDOSCOPY;  Service: Gastroenterology;  Laterality: N/A;   DILATION AND CURETTAGE OF UTERUS  2001 and 2006   FOOT SURGERY  2007   TONSILLECTOMY  1960   TUBAL LIGATION     vocal cord cyst removed      There were no vitals filed for this visit.   Subjective Assessment - 05/02/21 1034     Subjective Patient  notes she is continuing to recover from a fall. She has continued to manage DM without medication. Patient notes increased family stress as well. Patient states that her PF symptoms have continued to improve. She has had well controlled bladder and bowel compared to start of PT.    Currently in Pain? No/denies            TREATMENT   Neuromuscular Re-education:  Reassessed goals; see below.    Patient educated throughout session on appropriate technique and form using multi-modal cueing, HEP, and activity modification. Patient articulated understanding and returned demonstration.  Patient response to interventions:  Patient comfortable to return if more symptoms arise.   ASSESSMENT Patient presents to clinic with excellent motivation to participate in today's session. Patient has deficits in PFM coordination, PFM strength, pelvic organ support, posture, and IAP management. Patient indicating continued symptom management with HEP during today's session and has made significant progress toward or achievement of goals set forth. DPT and patient discussed continued HEP adherence to maintain progress and possible return to clinic if symptoms re-emerge or worsen. Patient may benefit from continued skilled therapeutic intervention to address deficits in PFM coordination, PFM strength, pelvic organ support, posture, and IAP management in order to improve function, and increase overall QOL.    PT Long Term Goals - 05/02/21 1051  PT LONG TERM GOAL #1   Title Patient will report decreased reliance on protective undergarments as indicated by < 3/24 hour period to demonstrate improved bowel control and allow for increased participation in activities including, but not limited to: walking, grocery shopping, and gardening.    Baseline IE: 3x/day or more; 11/1: 0-1x/day depending on community activities; 12/6: 0-1x/ day depending on community outings    Time 12    Period Weeks    Status Achieved     Target Date 05/09/21      PT LONG TERM GOAL #2   Title Patient will report less than/equal to 2x/night of emptying bladder with toileting strategies before bed in order to reduce disruption of sleep and improve overall sleep quality.    Baseline IE: 3x/night; 11/1: 1-2x/night; 12/6: 1-2x/night    Time 12    Period Weeks    Status Achieved    Target Date 05/09/21      PT LONG TERM GOAL #3   Title Patient will demonstrate circumferential and sequential contraction of >4/5 MMT, > 6 sec hold x10 and 5 consecutive quick flicks with </= 10 min rest between testing bouts, and relaxation of the PFM coordinated with breath for improved management of intra-abdominal pressure and normal bowel and bladder function without the presence of pain nor incontinence in order to improve participation at home and in the community.    Baseline IE: will assess next visit; 11/7: endurance 3 sec x4; x6 fast twitch; 12/6:    Time 12    Period Weeks    Status On-going    Target Date 05/09/21      PT LONG TERM GOAL #4   Title Patient will be able to achieve a score of >/=58 on the FOTO-Urinary Problem outcome measure to demonstrate a clinically significant change in symptoms and improvement of overall QOL.    Baseline IE: 48; 11/1: 61; 12/6: 61    Time 12    Period Weeks    Status Achieved    Target Date 05/09/21      PT LONG TERM GOAL #5   Title Patient will be able to demonstrate having quite a bit or moderate bladder control confidence  in order to reduce urinary incontinence episodes inside and outside the home.    Baseline IE: little confidence; 11/1: moderate confidence; 12/6: moderate to complete confidence    Time 12    Period Weeks    Status Achieved    Target Date 05/09/21                   Plan - 05/02/21 1050     Clinical Impression Statement Patient presents to clinic with excellent motivation to participate in today's session. Patient has deficits in PFM coordination, PFM  strength, pelvic organ support, posture, and IAP management. Patient indicating continued symptom management with HEP during today's session and has made significant progress toward or achievement of goals set forth. DPT and patient discussed continued HEP adherence to maintain progress and possible return to clinic if symptoms re-emerge or worsen. Patient may benefit from continued skilled therapeutic intervention to address deficits in PFM coordination, PFM strength, pelvic organ support, posture, and IAP management in order to improve function, and increase overall QOL.    Personal Factors and Comorbidities Age;Comorbidity 3+;Past/Current Experience;Time since onset of injury/illness/exacerbation    Comorbidities DM, diverticulosis, colonic polyps, hypercholesterolemia, stroke, thrombocytopenia    Examination-Activity Limitations Bed Mobility;Hygiene/Grooming;Bend;Lift;Squat;Locomotion Level;Stand;Continence;Toileting    Examination-Participation Restrictions Interpersonal Relationship;Church;Cleaning;Laundry;Yard Work;Community Activity;Shop  Stability/Clinical Decision Making Evolving/Moderate complexity    Rehab Potential Fair    PT Frequency 1x / week    PT Duration 12 weeks    PT Treatment/Interventions ADLs/Self Care Home Management;Cryotherapy;Electrical Stimulation;Biofeedback;Moist Heat;DME Instruction;Gait training;Stair training;Functional mobility training;Therapeutic activities;Therapeutic exercise;Balance training;Neuromuscular re-education;Patient/family education;Cognitive remediation;Orthotic Fit/Training;Manual techniques;Scar mobilization;Taping;Compression bandaging;Spinal Manipulations;Joint Manipulations    PT Next Visit Plan PFM progress    PT Home Exercise Plan TrA with bent knee fall outs, supine bridging    Consulted and Agree with Plan of Care Patient             Patient will benefit from skilled therapeutic intervention in order to improve the following deficits  and impairments:  Decreased endurance, Increased muscle spasms, Decreased scar mobility, Improper body mechanics, Decreased activity tolerance, Decreased coordination, Decreased strength, Postural dysfunction, Decreased mobility, Decreased skin integrity, Hypomobility  Visit Diagnosis: Muscle weakness (generalized)  Posture imbalance  Unspecified lack of coordination     Problem List Patient Active Problem List   Diagnosis Date Noted   Left wrist pain 04/25/2021   Tick bite of left thigh 10/17/2020   Itching 10/17/2020   Prolapse of female bladder, acquired 09/23/2020   Loose stools 04/30/2020   History of COVID-19 06/28/2019   Cough 09/02/2017   Glaucoma 03/25/2016   Type 2 diabetes mellitus with vascular disease ([place]) 11/27/2015   History of colonic polyps 04/07/2015   History of CVA (cerebrovascular accident) 08/02/2014   Stress 08/02/2014   Plantar fasciitis 07/05/2014   BMI 30.0-30.9,adult 07/05/2014   Health care maintenance 07/05/2014   Breast pain, right 05/25/2014   Pain of left heel 01/03/2014   Pain in soft tissues of limb 01/03/2014   Foot fracture, left 07/07/2013   Thrombocytopenia ([place]) 03/26/2012   Hypercholesteremia 03/26/2012    Myles Gip PT, DPT [phone]  05/02/2021, 2:16 PM  [facility] [address] [place], [place], [address] Phone: [phone]   Fax:  [phone]  Name: Victoria Harris MRN: [id number] Date of Birth: 05-Sep-1950

## 2021-05-03 DIAGNOSIS — H401111 Primary open-angle glaucoma, right eye, mild stage: Secondary | ICD-10-CM | POA: Diagnosis not present

## 2021-05-08 NOTE — Telephone Encounter (Signed)
Per chart, results given

## 2021-05-11 DIAGNOSIS — H401111 Primary open-angle glaucoma, right eye, mild stage: Secondary | ICD-10-CM | POA: Diagnosis not present

## 2021-05-11 LAB — HM DIABETES EYE EXAM

## 2021-05-16 ENCOUNTER — Encounter: Payer: Self-pay | Admitting: Physical Therapy

## 2021-05-18 ENCOUNTER — Other Ambulatory Visit: Payer: Self-pay

## 2021-05-18 ENCOUNTER — Encounter: Payer: Self-pay | Admitting: Podiatry

## 2021-05-18 ENCOUNTER — Ambulatory Visit: Payer: PPO | Admitting: Podiatry

## 2021-05-18 DIAGNOSIS — M79674 Pain in right toe(s): Secondary | ICD-10-CM

## 2021-05-18 DIAGNOSIS — E119 Type 2 diabetes mellitus without complications: Secondary | ICD-10-CM | POA: Diagnosis not present

## 2021-05-18 DIAGNOSIS — D696 Thrombocytopenia, unspecified: Secondary | ICD-10-CM

## 2021-05-18 DIAGNOSIS — B351 Tinea unguium: Secondary | ICD-10-CM | POA: Diagnosis not present

## 2021-05-18 DIAGNOSIS — M2012 Hallux valgus (acquired), left foot: Secondary | ICD-10-CM

## 2021-05-18 NOTE — Progress Notes (Signed)
This patient returns to my office for at risk foot care.  This patient requires this care by a professional since this patient will be at risk due to having diabetes and throbocytopenia..    This patient is unable to cut nails herself since the patient cannot reach her nails.These nails are painful walking and wearing shoes.  This patient presents for at risk foot care today.  General Appearance  Alert, conversant and in no acute stress.  Vascular  Dorsalis pedis and posterior tibial  pulses are palpable  bilaterally.  Capillary return is within normal limits  bilaterally. Temperature is within normal limits  bilaterally.  Neurologic  Senn-Weinstein monofilament wire test within normal limits  bilaterally. Muscle power within normal limits bilaterally.  Nails Thick disfigured discolored nails with subungual debris  from hallux to fifth toes bilaterally. No evidence of bacterial infection or drainage bilaterally.  Orthopedic  No limitations of motion  feet .  No crepitus or effusions noted.  No bony pathology or digital deformities noted. Plantar flexed 2,3 right foot.  Skin  normotropic skin  noted bilaterally.  No signs of infections or ulcers noted.   Asymptomatic porokeratosis sub 2 right foot.  Onychomycosis  Pain in right toes  Pain in left toes  Consent was obtained for treatment procedures.   Mechanical debridement of nails 1-5  bilaterally performed with a nail nipper.  Filed with dremel without incident.    Return office visit  4 months                   Told patient to return for periodic foot care and evaluation due to potential at risk complications.   Denissa Cozart DPM  

## 2021-06-07 ENCOUNTER — Ambulatory Visit: Payer: PPO | Admitting: Obstetrics and Gynecology

## 2021-06-29 NOTE — Progress Notes (Signed)
[facility] Return Visit  SUBJECTIVE  History of Present Illness: Victoria Harris is a 71 y.o. female seen in follow-up for OAB, fecal incontinence and prolapse. She has been on Myrbetriq and physical therapy for the incontinence.   She feels that her symptoms are better. Still has occasional leakage at night on the way to the bathroom or coming home but is not happening every day.   BP is elevated today. She states she normally checks at home and it has been fine. She received bad news right before today's visit and feels this may be causing stress.   Past Medical History: Patient  has a past medical history of Colon polyps, Diabetes mellitus without complication ([place]), Diverticulosis, adenomatous colonic polyps, Hypercholesterolemia, Serrated adenoma of colon, Stroke (cerebrum) ([place]), Thrombocytopenia ([place]), and Uterus descensus.   Past Surgical History: She  has a past surgical history that includes Cholecystectomy (2007); Tonsillectomy (1960); Abdominal hysterectomy (2009); Foot surgery (2007); Dilation and curettage of uterus (2001 and 2006); vocal cord cyst removed; Colonoscopy (N/A, 04/05/2015); Tubal ligation; Colonoscopy with propofol (N/A, 10/09/2016); and Colonoscopy with propofol (N/A, 12/02/2019).   Medications: She has a current medication list which includes the following prescription(s): aspirin, cholecalciferol, colestipol, dorzolamide-timolol, mirabegron er, multiple vitamin, onetouch verio, probiotic daily, rosuvastatin, travatan z, and triamcinolone ointment.   Allergies: Patient is allergic to no known drug allergy.   Social History: Patient  reports that she has never smoked. She has never used smokeless tobacco. She reports that she does not drink alcohol and does not use drugs.      OBJECTIVE     Physical Exam: Vitals:   06/30/21 1057  BP: (!) 153/88  Pulse: 64    Gen: No apparent distress, A&O x 3.  Detailed Urogynecologic Evaluation:   Deferred.    ASSESSMENT AND PLAN    [name] is a 71 y.o. with:  No diagnosis found.  OAB - She would like to try to come off the medication. Discontinue the Myrbetriq for at least 2 weeks to assess symptoms.  - If she does not notice a difference in the medication, then can stay off of it - If symptoms return, she will let me know and I can refill her prescription. In that case she will need to closely monitor her BP  Return if needed  Jaquita Folds, MD  Time spent: I spent 20 minutes dedicated to the care of this patient on the date of this encounter to include pre-visit review of records, face-to-face time with the patient and post visit documentation.

## 2021-06-30 ENCOUNTER — Ambulatory Visit: Payer: PPO | Admitting: Obstetrics and Gynecology

## 2021-06-30 ENCOUNTER — Encounter: Payer: Self-pay | Admitting: Obstetrics and Gynecology

## 2021-06-30 ENCOUNTER — Other Ambulatory Visit: Payer: Self-pay

## 2021-06-30 VITALS — BP 153/88 | HR 64

## 2021-06-30 DIAGNOSIS — N3281 Overactive bladder: Secondary | ICD-10-CM | POA: Diagnosis not present

## 2021-06-30 NOTE — Patient Instructions (Signed)
Stop Myrbetriq for at least 2 weeks and see what symptoms are like. If symptoms return, let us know and we can renew your prescription. You will have to watch your blood pressure on this medication, as it was elevated today but not elevated previous visits.

## 2021-08-22 ENCOUNTER — Other Ambulatory Visit (INDEPENDENT_AMBULATORY_CARE_PROVIDER_SITE_OTHER): Payer: PPO

## 2021-08-22 ENCOUNTER — Other Ambulatory Visit: Payer: Self-pay

## 2021-08-22 DIAGNOSIS — D696 Thrombocytopenia, unspecified: Secondary | ICD-10-CM

## 2021-08-22 DIAGNOSIS — E1159 Type 2 diabetes mellitus with other circulatory complications: Secondary | ICD-10-CM | POA: Diagnosis not present

## 2021-08-22 DIAGNOSIS — E7800 Pure hypercholesterolemia, unspecified: Secondary | ICD-10-CM | POA: Diagnosis not present

## 2021-08-22 LAB — CBC WITH DIFFERENTIAL/PLATELET
Basophils Absolute: 0 10*3/uL (ref 0.0–0.1)
Basophils Relative: 0.4 % (ref 0.0–3.0)
Eosinophils Absolute: 0.1 10*3/uL (ref 0.0–0.7)
Eosinophils Relative: 1.8 % (ref 0.0–5.0)
HCT: 43.9 % (ref 36.0–46.0)
Hemoglobin: 14.5 g/dL (ref 12.0–15.0)
Lymphocytes Relative: 27 % (ref 12.0–46.0)
Lymphs Abs: 1.5 10*3/uL (ref 0.7–4.0)
MCHC: 33.1 g/dL (ref 30.0–36.0)
MCV: 95.3 fl (ref 78.0–100.0)
Monocytes Absolute: 0.3 10*3/uL (ref 0.1–1.0)
Monocytes Relative: 5.9 % (ref 3.0–12.0)
Neutro Abs: 3.7 10*3/uL (ref 1.4–7.7)
Neutrophils Relative %: 64.9 % (ref 43.0–77.0)
Platelets: 168 10*3/uL (ref 150.0–400.0)
RBC: 4.6 Mil/uL (ref 3.87–5.11)
RDW: 13.3 % (ref 11.5–15.5)
WBC: 5.7 10*3/uL (ref 4.0–10.5)

## 2021-08-22 LAB — LIPID PANEL
Cholesterol: 177 mg/dL (ref 0–200)
HDL: 61 mg/dL (ref >39.00)
LDL Cholesterol: 89 mg/dL (ref 0–99)
NonHDL: 115.64
Total CHOL/HDL Ratio: 3
Triglycerides: 135 mg/dL (ref 0.0–149.0)
VLDL: 27 mg/dL (ref 0.0–40.0)

## 2021-08-22 LAB — BASIC METABOLIC PANEL
BUN: 19 mg/dL (ref 6–23)
CO2: 29 mEq/L (ref 19–32)
Calcium: 9.4 mg/dL (ref 8.4–10.5)
Chloride: 104 mEq/L (ref 96–112)
Creatinine, Ser: 0.65 mg/dL (ref 0.40–1.20)
GFR: 88.36 mL/min (ref >60.00)
Glucose, Bld: 185 mg/dL — ABNORMAL HIGH (ref 70–99)
Potassium: 4 mEq/L (ref 3.5–5.1)
Sodium: 141 mEq/L (ref 135–145)

## 2021-08-22 LAB — HEPATIC FUNCTION PANEL
ALT: 25 U/L (ref 0–35)
AST: 24 U/L (ref 0–37)
Albumin: 4.3 g/dL (ref 3.5–5.2)
Alkaline Phosphatase: 62 U/L (ref 39–117)
Bilirubin, Direct: 0.2 mg/dL (ref 0.0–0.3)
Total Bilirubin: 0.8 mg/dL (ref 0.2–1.2)
Total Protein: 6.3 g/dL (ref 6.0–8.3)

## 2021-08-22 LAB — HEMOGLOBIN A1C: Hgb A1c MFr Bld: 8.2 % — ABNORMAL HIGH (ref 4.6–6.5)

## 2021-08-22 LAB — TSH: TSH: 2.03 u[IU]/mL (ref 0.35–5.50)

## 2021-08-24 ENCOUNTER — Ambulatory Visit (INDEPENDENT_AMBULATORY_CARE_PROVIDER_SITE_OTHER): Payer: PPO | Admitting: Internal Medicine

## 2021-08-24 VITALS — BP 130/80 | HR 59 | Temp 97.9°F | Resp 16 | Ht 65.0 in | Wt 172.6 lb

## 2021-08-24 DIAGNOSIS — E1159 Type 2 diabetes mellitus with other circulatory complications: Secondary | ICD-10-CM | POA: Diagnosis not present

## 2021-08-24 DIAGNOSIS — Z8673 Personal history of transient ischemic attack (TIA), and cerebral infarction without residual deficits: Secondary | ICD-10-CM

## 2021-08-24 DIAGNOSIS — D696 Thrombocytopenia, unspecified: Secondary | ICD-10-CM | POA: Diagnosis not present

## 2021-08-24 DIAGNOSIS — Z86010 Personal history of colonic polyps: Secondary | ICD-10-CM | POA: Diagnosis not present

## 2021-08-24 DIAGNOSIS — E7800 Pure hypercholesterolemia, unspecified: Secondary | ICD-10-CM | POA: Diagnosis not present

## 2021-08-24 MED ORDER — EMPAGLIFLOZIN 10 MG PO TABS: 10.0000 mg | ORAL_TABLET | Freq: Every day | ORAL | 2 refills | Status: DC

## 2021-08-24 NOTE — Progress Notes (Signed)
[name], female   DOB: Apr 04, 1951, 71 y.o.   MRN: [id number] ? ? ?Subjective:  ? ? [name], female    DOB: 1950/12/09, 71 y.o.   MRN: [id number] ? ?This visit occurred during the SARS-CoV-2 public health emergency.  Safety protocols were in place, including screening questions prior to the visit, additional usage of staff PPE, and extensive cleaning of exam room while observing appropriate contact time as indicated for disinfecting solutions.  ? ?Patient here for a scheduled follow up.  ? ?Chief Complaint  ?Patient presents with  ? Hyperlipidemia  ? Hypertension  ? .  ? ?HPI ?Here to follow up regarding her diabetes and cholesterol.  Recent labs - a1c - 8.2.  she is watching her sugars.  Stays active.  Discussed diet.  She is monitoring carb intake.  No chest pain or sob reported.  No abdominal pain.  Bowels doing better.  Not having to take colsetid on a regular basis.  No history of recurring UTIs.  No history of yeast infections.  Discussed labs.  Discussed treatment options.  Prefers not to do injection.  Had intolerance to metformin.  States am sugars 160-170s.  After eating - some 200 sugars.  ? ? ?Past Medical History:  ?Diagnosis Date  ? Colon polyps   ? hyperplastic  ? Diabetes mellitus without complication ([place])   ? Diverticulosis   ? Hx of adenomatous colonic polyps   ? tubular adenoma  ? Hypercholesterolemia   ? Serrated adenoma of colon   ? Stroke (cerebrum) ([place])   ? Thrombocytopenia ([place])   ? Uterus descensus   ? ?Past Surgical History:  ?Procedure Laterality Date  ? ABDOMINAL HYSTERECTOMY  2009  ? CHOLECYSTECTOMY  2007  ? COLONOSCOPY N/A 04/05/2015  ? Procedure: COLONOSCOPY;  Surgeon: Lollie Sails, MD;  Location: [facility] ENDOSCOPY;  Service: Endoscopy;  Laterality: N/A;  ? COLONOSCOPY WITH PROPOFOL N/A 10/09/2016  ? Procedure: COLONOSCOPY WITH PROPOFOL;  Surgeon: Lollie Sails, MD;  Location: [facility] ENDOSCOPY;  Service: Endoscopy;  Laterality: N/A;   ? COLONOSCOPY WITH PROPOFOL N/A 12/02/2019  ? Procedure: COLONOSCOPY WITH PROPOFOL;  Surgeon: Toledo, Benay Pike, MD;  Location: [facility] ENDOSCOPY;  Service: Gastroenterology;  Laterality: N/A;  ? [place] OF UTERUS  2001 and 2006  ? FOOT SURGERY  2007  ? TONSILLECTOMY  1960  ? TUBAL LIGATION    ? vocal cord cyst removed    ? ?Family History  ?Problem Relation Age of Onset  ? Colon cancer Mother   ? Diabetes Mother   ? Kidney disease Mother   ? Heart disease Mother   ? Prostate cancer Father   ? Diabetes Father   ? Glaucoma Father   ? Uterine cancer Maternal Aunt   ? Breast cancer Paternal Aunt   ?     x2  ? Stroke Paternal Grandmother   ? Stroke Paternal Grandfather   ? Diabetes Brother   ? Diabetes Brother   ? Diabetes Other   ? ?Social History  ? ?Socioeconomic History  ? Marital status: Married  ?  Spouse name: Not on file  ? Number of children: Not on file  ? Years of education: Not on file  ? Highest education level: Not on file  ?Occupational History  ? Not on file  ?Tobacco Use  ? Smoking status: Never  ? Smokeless tobacco: Never  ?Vaping Use  ? Vaping Use: Never used  ?Substance and Sexual Activity  ?  Alcohol use: No  ?  Alcohol/week: 0.0 standard drinks  ? Drug use: No  ? Sexual activity: Not Currently  ?Other Topics Concern  ? Not on file  ?Social History Narrative  ? Not on file  ? ?Social Determinants of Health  ? ?Financial Resource Strain: Low Risk   ? Difficulty of Paying Living Expenses: Not hard at all  ?Food Insecurity: No Food Insecurity  ? Worried About Charity fundraiser in the Last Year: Never true  ? Ran Out of Food in the Last Year: Never true  ?Transportation Needs: No Transportation Needs  ? Lack of Transportation (Medical): No  ? Lack of Transportation (Non-Medical): No  ?Physical Activity: Insufficiently Active  ? Days of Exercise per Week: 3 days  ? Minutes of Exercise per Session: 20 min  ?Stress: No Stress Concern Present  ? Feeling of Stress : Not at all  ?Social  Connections: Unknown  ? Frequency of Communication with Friends and Family: Not on file  ? Frequency of Social Gatherings with Friends and Family: Not on file  ? Attends Religious Services: Not on file  ? Active Member of Clubs or Organizations: Not on file  ? Attends Archivist Meetings: Not on file  ? Marital Status: Married  ? ? ? ?Review of Systems  ?Constitutional:  Negative for appetite change and unexpected weight change.  ?HENT:  Negative for congestion and sinus pressure.   ?Respiratory:  Negative for cough, chest tightness and shortness of breath.   ?Cardiovascular:  Negative for chest pain, palpitations and leg swelling.  ?Gastrointestinal:  Negative for abdominal pain, diarrhea, nausea and vomiting.  ?Genitourinary:  Negative for difficulty urinating and dysuria.  ?Musculoskeletal:  Negative for joint swelling and myalgias.  ?Skin:  Negative for color change and rash.  ?Neurological:  Negative for dizziness, light-headedness and headaches.  ?Psychiatric/Behavioral:  Negative for agitation and dysphoric mood.   ? ?   ?Objective:  ?  ? ?BP 130/80   Pulse (!) 59   Temp 97.9 ?F (36.6 ?C)   Resp 16   Ht '5\' 5"'$  (1.651 m)   Wt 172 lb 9.6 oz (78.3 kg)   SpO2 97%   BMI 28.72 kg/m?  ?Wt Readings from Last 3 Encounters:  ?08/24/21 172 lb 9.6 oz (78.3 kg)  ?04/26/21 174 lb (78.9 kg)  ?04/25/21 174 lb 12.8 oz (79.3 kg)  ? ? ?Physical Exam ?Vitals reviewed.  ?Constitutional:   ?   General: She is not in acute distress. ?   Appearance: Normal appearance.  ?HENT:  ?   Head: Normocephalic and atraumatic.  ?   Right Ear: External ear normal.  ?   Left Ear: External ear normal.  ?Eyes:  ?   General: No scleral icterus.    ?   Right eye: No discharge.     ?   Left eye: No discharge.  ?   Conjunctiva/sclera: Conjunctivae normal.  ?Neck:  ?   Thyroid: No thyromegaly.  ?Cardiovascular:  ?   Rate and Rhythm: Normal rate and regular rhythm.  ?Pulmonary:  ?   Effort: No respiratory distress.  ?   Breath sounds:  Normal breath sounds. No wheezing.  ?Abdominal:  ?   General: Bowel sounds are normal.  ?   Palpations: Abdomen is soft.  ?   Tenderness: There is no abdominal tenderness.  ?Musculoskeletal:     ?   General: No swelling or tenderness.  ?   Cervical back: Neck supple. No tenderness.  ?  Lymphadenopathy:  ?   Cervical: No cervical adenopathy.  ?Skin: ?   Findings: No erythema or rash.  ?Neurological:  ?   Mental Status: She is alert.  ?Psychiatric:     ?   Mood and Affect: Mood normal.     ?   Behavior: Behavior normal.  ? ? ? ?Outpatient Encounter Medications as of 08/24/2021  ?Medication Sig  ? empagliflozin (JARDIANCE) 10 MG TABS tablet Take 1 tablet (10 mg total) by mouth daily before breakfast.  ? aspirin 81 MG tablet Take 81 mg by mouth daily.  ? Cholecalciferol 25 MCG (1000 UT) tablet Take 1,000 Units by mouth daily.   ? colestipol (COLESTID) 1 g tablet Take 1 g by mouth daily.   ? dorzolamide-timolol (COSOPT) 22.3-6.8 MG/ML ophthalmic solution Place 1 drop into both eyes 2 (two) times daily.  ? mirabegron ER (MYRBETRIQ) 25 MG TB24 tablet Take 1 tablet (25 mg total) by mouth daily.  ? Multiple Vitamins-Minerals (MULTIVITAMIN ADULT PO) Take by mouth daily.  ? ONETOUCH VERIO test strip USE AS INSTRUCTED TO CHECK BLOOD SUGARS DAILY. DX E11.9  ? Probiotic Product (PROBIOTIC DAILY) CAPS Take 1 capsule by mouth daily.  ? rosuvastatin (CRESTOR) 5 MG tablet Take 1 tablet (5 mg total) by mouth daily.  ? TRAVATAN Z 0.004 % SOLN ophthalmic solution Place 1 drop into both eyes at bedtime.  ? triamcinolone ointment (KENALOG) 0.5 % Apply 1 application topically 2 (two) times daily. For itching.  ? ?No facility-administered encounter medications on file as of 08/24/2021.  ?  ? ?Lab Results  ?Component Value Date  ? WBC 5.7 08/22/2021  ? HGB 14.5 08/22/2021  ? HCT 43.9 08/22/2021  ? PLT 168.0 08/22/2021  ? GLUCOSE 185 (H) 08/22/2021  ? CHOL 177 08/22/2021  ? TRIG 135.0 08/22/2021  ? HDL 61.00 08/22/2021  ? [place] 89 08/22/2021  ?  ALT 25 08/22/2021  ? AST 24 08/22/2021  ? NA 141 08/22/2021  ? K 4.0 08/22/2021  ? CL 104 08/22/2021  ? CREATININE 0.65 08/22/2021  ? BUN 19 08/22/2021  ? CO2 29 08/22/2021  ? TSH 2.03 08/22/2021  ? HGBA1C 8.2 (H) 03/

## 2021-08-27 ENCOUNTER — Encounter: Payer: Self-pay | Admitting: Internal Medicine

## 2021-08-27 NOTE — Assessment & Plan Note (Signed)
Continue crestor.  Low cholesterol diet and exercise. Follow lipid panel and liver function tests.   

## 2021-08-27 NOTE — Assessment & Plan Note (Signed)
Follow cbc.  

## 2021-08-27 NOTE — Assessment & Plan Note (Signed)
Bowels doing better off metformin.  Discussed recent a1c - elevation.  Discussed low carb diet and exercise.  Discussed treatment options.  Desires not to start injections.  Had intolerance to metformin.  Agreeable to jardiance.  Start 73m q day.  Follow sugars.  Follow met b and a1c.  ?

## 2021-08-27 NOTE — Assessment & Plan Note (Signed)
Colonoscopy 11/2019 - diverticulosis.  Recommended f/u colonoscopy in 5 years.  

## 2021-08-27 NOTE — Assessment & Plan Note (Signed)
Continue aspirin 

## 2021-09-05 ENCOUNTER — Telehealth: Payer: Self-pay | Admitting: Internal Medicine

## 2021-09-05 NOTE — Telephone Encounter (Signed)
Patient dropped off sugar readings. Readings are up front in Dr Bary Leriche color folder. ?

## 2021-09-06 ENCOUNTER — Other Ambulatory Visit: Payer: Self-pay | Admitting: Internal Medicine

## 2021-09-07 ENCOUNTER — Encounter: Payer: Self-pay | Admitting: Internal Medicine

## 2021-09-07 ENCOUNTER — Ambulatory Visit (INDEPENDENT_AMBULATORY_CARE_PROVIDER_SITE_OTHER): Payer: PPO | Admitting: Internal Medicine

## 2021-09-07 DIAGNOSIS — R531 Weakness: Secondary | ICD-10-CM

## 2021-09-07 DIAGNOSIS — Z8673 Personal history of transient ischemic attack (TIA), and cerebral infarction without residual deficits: Secondary | ICD-10-CM

## 2021-09-07 DIAGNOSIS — R42 Dizziness and giddiness: Secondary | ICD-10-CM | POA: Diagnosis not present

## 2021-09-07 DIAGNOSIS — R55 Syncope and collapse: Secondary | ICD-10-CM

## 2021-09-07 DIAGNOSIS — N3000 Acute cystitis without hematuria: Secondary | ICD-10-CM | POA: Diagnosis not present

## 2021-09-07 DIAGNOSIS — E119 Type 2 diabetes mellitus without complications: Secondary | ICD-10-CM | POA: Diagnosis not present

## 2021-09-07 LAB — BRAIN NATRIURETIC PEPTIDE: Pro B Natriuretic peptide (BNP): 75 pg/mL (ref 0.0–100.0)

## 2021-09-07 LAB — TROPONIN I (HIGH SENSITIVITY): High Sens Troponin I: 4 ng/L (ref 2–17)

## 2021-09-07 LAB — D-DIMER, QUANTITATIVE: D-Dimer, Quant: 0.63 mcg/mL FEU — ABNORMAL HIGH (ref <0.50)

## 2021-09-07 MED ORDER — RYBELSUS 3 MG PO TABS: 3.0000 mg | ORAL_TABLET | Freq: Every day | ORAL | Status: DC

## 2021-09-07 MED ORDER — RYBELSUS 7 MG PO TABS: 7.0000 mg | ORAL_TABLET | Freq: Every day | ORAL | 0 refills | Status: DC

## 2021-09-07 NOTE — Progress Notes (Addendum)
No chief complaint on file. ? ?Walk in for pre-syncope h/o syncope ?Taking jardiance 10 mg x 2 weeks and noticed since taking jardiance felt like going to pass out or going to pass out but since passed out on boat w/in the last few weeks.  When she was on the boat she felt hot, weak, something not right when she was on the boat her husband checked sugar but before sugar checked family gave her candy when she felt like she was going to pass out. She had nausea w/o vomitting and ate viennas hot dogs but did not want to eat due to feeling sick while on the boat but after eating supper ok on this day. Another time while at home cooking and sat down to rest bp was 109/68 or even lower this am 111/68. ? ? ?Today she was [employer] walking around today hydrated with water and did not feel well so came here EKG NSR today lyring 134/84 hr 68 O2 90%, sitting 124/84 HR 72 O2 94%, standing 124/76 HR 72 O2 94% and standing again 136/92 HR 67 O2 92% ? ?She reports at baseline has issue with dehydration and since on Jardiance feels this has made worse  ? ?DM 2 on jardiance 10 improved BP in the am 116 instead of 160s-170s, 130s cbg in the am this am  ? ? ? ? ?Review of Systems  ?Constitutional:  Negative for weight loss.  ?HENT:  Negative for hearing loss.   ?Eyes:  Negative for blurred vision.  ?Respiratory:  Negative for shortness of breath.   ?Cardiovascular:  Negative for chest pain.  ?Gastrointestinal:  Negative for abdominal pain and blood in stool.  ?Genitourinary:  Negative for dysuria.  ?Musculoskeletal:  Negative for falls and joint pain.  ?Skin:  Negative for rash.  ?Neurological:  Negative for loss of consciousness and headaches.  ?Psychiatric/Behavioral:  Negative for depression.   ?Past Medical History:  ?Diagnosis Date  ? Colon polyps   ? hyperplastic  ? Diabetes mellitus without complication ([place])   ? Diverticulosis   ? Hx of adenomatous colonic polyps   ? tubular adenoma  ? Hypercholesterolemia   ? Serrated adenoma  of colon   ? Stroke (cerebrum) ([place])   ? Thrombocytopenia ([place])   ? Uterus descensus   ? ?Past Surgical History:  ?Procedure Laterality Date  ? ABDOMINAL HYSTERECTOMY  2009  ? CHOLECYSTECTOMY  2007  ? COLONOSCOPY N/A 04/05/2015  ? Procedure: COLONOSCOPY;  Surgeon: Lollie Sails, MD;  Location: [facility] ENDOSCOPY;  Service: Endoscopy;  Laterality: N/A;  ? COLONOSCOPY WITH PROPOFOL N/A 10/09/2016  ? Procedure: COLONOSCOPY WITH PROPOFOL;  Surgeon: Lollie Sails, MD;  Location: [facility] ENDOSCOPY;  Service: Endoscopy;  Laterality: N/A;  ? COLONOSCOPY WITH PROPOFOL N/A 12/02/2019  ? Procedure: COLONOSCOPY WITH PROPOFOL;  Surgeon: Toledo, Benay Pike, MD;  Location: [facility] ENDOSCOPY;  Service: Gastroenterology;  Laterality: N/A;  ? [place] OF UTERUS  2001 and 2006  ? FOOT SURGERY  2007  ? TONSILLECTOMY  1960  ? TUBAL LIGATION    ? vocal cord cyst removed    ? ?Family History  ?Problem Relation Age of Onset  ? Colon cancer Mother   ? Diabetes Mother   ? Kidney disease Mother   ? Heart disease Mother   ? Prostate cancer Father   ? Diabetes Father   ? Glaucoma Father   ? Uterine cancer Maternal Aunt   ? Breast cancer Paternal Aunt   ?  x2  ? Stroke Paternal Grandmother   ? Stroke Paternal Grandfather   ? Diabetes Brother   ? Diabetes Brother   ? Diabetes Other   ? ?Social History  ? ?Socioeconomic History  ? Marital status: Married  ?  Spouse name: Not on file  ? Number of children: Not on file  ? Years of education: Not on file  ? Highest education level: Not on file  ?Occupational History  ? Not on file  ?Tobacco Use  ? Smoking status: Never  ? Smokeless tobacco: Never  ?Vaping Use  ? Vaping Use: Never used  ?Substance and Sexual Activity  ? Alcohol use: No  ?  Alcohol/week: 0.0 standard drinks  ? Drug use: No  ? Sexual activity: Not Currently  ?Other Topics Concern  ? Not on file  ?Social History Narrative  ? Not on file  ? ?Social Determinants of Health  ? ?Financial Resource Strain: Low Risk   ? Difficulty of  Paying Living Expenses: Not hard at all  ?Food Insecurity: No Food Insecurity  ? Worried About Charity fundraiser in the Last Year: Never true  ? Ran Out of Food in the Last Year: Never true  ?Transportation Needs: No Transportation Needs  ? Lack of Transportation (Medical): No  ? Lack of Transportation (Non-Medical): No  ?Physical Activity: Insufficiently Active  ? Days of Exercise per Week: 3 days  ? Minutes of Exercise per Session: 20 min  ?Stress: No Stress Concern Present  ? Feeling of Stress : Not at all  ?Social Connections: Unknown  ? Frequency of Communication with Friends and Family: Not on file  ? Frequency of Social Gatherings with Friends and Family: Not on file  ? Attends Religious Services: Not on file  ? Active Member of Clubs or Organizations: Not on file  ? Attends Archivist Meetings: Not on file  ? Marital Status: Married  ?Intimate Partner Violence: Not At Risk  ? Fear of Current or Ex-Partner: No  ? Emotionally Abused: No  ? Physically Abused: No  ? Sexually Abused: No  ? ?Current Meds  ?Medication Sig  ? Semaglutide (RYBELSUS) 3 MG TABS Take 3 mg by mouth daily.  ? [START ON 10/07/2021] Semaglutide (RYBELSUS) 7 MG TABS Take 7 mg by mouth daily.  ? ?Allergies  ?Allergen Reactions  ? Glucophage Xr [Metformin]   ?  diarrhea  ? ?Recent Results (from the past 2160 hour(s))  ?CBC with Differential/Platelet     Status: None  ? Collection Time: 08/22/21  8:49 AM  ?Result Value Ref Range  ? WBC 5.7 4.0 - 10.5 K/uL  ? RBC 4.60 3.87 - 5.11 Mil/uL  ? Hemoglobin 14.5 12.0 - 15.0 g/dL  ? HCT 43.9 36.0 - 46.0 %  ? MCV 95.3 78.0 - 100.0 fl  ? MCHC 33.1 30.0 - 36.0 g/dL  ? RDW 13.3 11.5 - 15.5 %  ? Platelets 168.0 150.0 - 400.0 K/uL  ? Neutrophils Relative % 64.9 43.0 - 77.0 %  ? Lymphocytes Relative 27.0 12.0 - 46.0 %  ? Monocytes Relative 5.9 3.0 - 12.0 %  ? Eosinophils Relative 1.8 0.0 - 5.0 %  ? Basophils Relative 0.4 0.0 - 3.0 %  ? Neutro Abs 3.7 1.4 - 7.7 K/uL  ? Lymphs Abs 1.5 0.7 - 4.0 K/uL   ? Monocytes Absolute 0.3 0.1 - 1.0 K/uL  ? Eosinophils Absolute 0.1 0.0 - 0.7 K/uL  ? Basophils Absolute 0.0 0.0 - 0.1 K/uL  ?Basic  metabolic panel     Status: Abnormal  ? Collection Time: 08/22/21  8:49 AM  ?Result Value Ref Range  ? Sodium 141 135 - 145 mEq/L  ? Potassium 4.0 3.5 - 5.1 mEq/L  ? Chloride 104 96 - 112 mEq/L  ? CO2 29 19 - 32 mEq/L  ? Glucose, Bld 185 (H) 70 - 99 mg/dL  ? BUN 19 6 - 23 mg/dL  ? Creatinine, Ser 0.65 0.40 - 1.20 mg/dL  ? GFR 88.36 >60.00 mL/min  ?  Comment: Calculated using the CKD-EPI Creatinine Equation (2021)  ? Calcium 9.4 8.4 - 10.5 mg/dL  ?TSH     Status: None  ? Collection Time: 08/22/21  8:49 AM  ?Result Value Ref Range  ? TSH 2.03 0.35 - 5.50 uIU/mL  ?Lipid panel     Status: None  ? Collection Time: 08/22/21  8:49 AM  ?Result Value Ref Range  ? Cholesterol 177 0 - 200 mg/dL  ?  Comment: ATP III Classification       Desirable:  < 200 mg/dL               Borderline High:  200 - 239 mg/dL          High:  > = 240 mg/dL  ? Triglycerides 135.0 0.0 - 149.0 mg/dL  ?  Comment: Normal:  <150 mg/dLBorderline High:  150 - 199 mg/dL  ? HDL 61.00 >39.00 mg/dL  ? VLDL 27.0 0.0 - 40.0 mg/dL  ? LDL Cholesterol 89 0 - 99 mg/dL  ? Total CHOL/HDL Ratio 3   ?  Comment:                Men          Women1/2 Average Risk     3.4          3.3Average Risk          5.0          4.42X Average Risk          9.6          7.13X Average Risk          15.0          11.0                      ? NonHDL 115.64   ?  Comment: NOTE:  Non-HDL goal should be 30 mg/dL higher than patient's LDL goal (i.e. LDL goal of < 70 mg/dL, would have non-HDL goal of < 100 mg/dL)  ?Hepatic function panel     Status: None  ? Collection Time: 08/22/21  8:49 AM  ?Result Value Ref Range  ? Total Bilirubin 0.8 0.2 - 1.2 mg/dL  ? Bilirubin, Direct 0.2 0.0 - 0.3 mg/dL  ? Alkaline Phosphatase 62 39 - 117 U/L  ? AST 24 0 - 37 U/L  ? ALT 25 0 - 35 U/L  ? Total Protein 6.3 6.0 - 8.3 g/dL  ? Albumin 4.3 3.5 - 5.2 g/dL  ?Hemoglobin A1c      Status: Abnormal  ? Collection Time: 08/22/21  8:49 AM  ?Result Value Ref Range  ? Hgb A1c MFr Bld 8.2 (H) 4.6 - 6.5 %  ?  Comment: Glycemic Control Guidelines for People with Diabetes:Non Diabetic:  <6%

## 2021-09-07 NOTE — Progress Notes (Signed)
Patient was on boat last Friday and had these same feelings at the beach on boat fishing stated she had syncope episode, while on the boat she remembers waking and her family put candy in her mouth. Today in [place] [employer] and started feel the same again, patient stated she ate chicken nuggets and side salad around 12;30. Still feels weak and lite headed. ?

## 2021-09-07 NOTE — Telephone Encounter (Signed)
Placed in results folder.  

## 2021-09-07 NOTE — Patient Instructions (Addendum)
Ray Church, MD   ?[place]   ?[place], [place] [address]   ?Phone: [phone]   ?Fax: [place] [place]   ?[place], [place] [address]   ?Phone: [phone]   ?Fax: [phone]  ?Consider neurology if you continue to pass out and CT scan head  ? ?Please stop Jardiance 10 mg daily and follow up with Dr. Nicki Reaper  ?Start rybelsus 3 mg daily x 1 month if tolerating increase to 7 mg daily sent to your pharmacy  ? ?Semaglutide Tablets ?What is this medication? ?SEMAGLUTIDE (SEM a GLOO tide) treats type 2 diabetes. It works by increasing insulin levels in your body, which decreases your blood sugar (glucose). It also reduces the amount of sugar released into the blood and slows down your digestion. Changes to diet and exercise are often combined with this medication. ?This medicine may be used for other purposes; ask your health care provider or pharmacist if you have questions. ?COMMON BRAND NAME(S): Rybelsus ?What should I tell my care team before I take this medication? ?They need to know if you have any of these conditions: ?Endocrine tumors (MEN 2) or if someone in your family had these tumors ?Eye disease ?History of pancreatitis ?Kidney disease ?Stomach or intestine problems ?Thyroid cancer or if someone in your family had thyroid cancer ?Vision problems ?An unusual or allergic reaction to semaglutide, other medications, foods, dyes, or preservatives ?Pregnant or trying to get pregnant ?Breast-feeding ?How should I use this medication? ?Take this medication by mouth. Take it as directed on the prescription label at the same time every day. Take the dose right after waking up. Do not eat or drink anything before taking it. Do not take it with any other drink except a glass of plain water that is less than 4 ounces (less than 120 mL). Do not cut, crush or chew this medication. Swallow the tablets whole. After taking it, do not eat breakfast, drink, or take  any other medications or vitamins for at least 30 minutes. Keep taking it unless your care team tells you to stop. ?A special MedGuide will be given to you by the pharmacist with each prescription and refill. Be sure to read this information carefully each time. ?Talk to your care team about the use of this medication in children. Special care may be needed. ?Overdosage: If you think you have taken too much of this medicine contact a poison control center or emergency room at once. ?NOTE: This medicine is only for you. Do not share this medicine with others. ?What if I miss a dose? ?If you miss a dose, skip it. Take your next dose at the normal time. Do not take extra or 2 doses at the same time to make up for the missed dose. ?What may interact with this medication? ?What may interact with this medication? ?Aminophylline ?Carbamazepine ?Cyclosporine ?Digoxin ?Levothyroxine ?Other medications for diabetes ?Phenytoin ?Tacrolimus ?Theophylline ?Warfarin ?Many medications may cause changes in blood sugar, these include: ?Alcohol containing beverages ?Antiviral medications for HIV or AIDS ?Aspirin and aspirin-like medications ?Certain medications for blood pressure, heart disease, irregular heart beat ?Chromium ?Diuretics ?Female hormones, such as estrogens or progestins, birth control pills ?Fenofibrate ?Gemfibrozil ?Isoniazid ?Lanreotide ?Female hormones or anabolic steroids ?MAOIs like Carbex, Eldepryl, Marplan, Nardil, and Parnate ?Medications for weight loss ?Medications for allergies, asthma, cold, or cough ?Medications for depression, anxiety, or psychotic disturbances ?Niacin ?Nicotine ?NSAIDs, medications for pain and inflammation, like ibuprofen or naproxen ?Octreotide ?  Pasireotide ?Pentamidine ?Phenytoin ?Probenecid ?Quinolone antibiotics such as ciprofloxacin, levofloxacin, ofloxacin ?Some herbal dietary supplements ?Steroid medications such as prednisone or cortisone ?Sulfamethoxazole; trimethoprim ?Thyroid  hormones ?Some medications can hide the warning symptoms of low blood sugar (hypoglycemia). You may need to monitor your blood sugar more closely if you are taking one of these medications. These include: ?Beta-blockers, often used for high blood pressure or heart problems (examples include atenolol, metoprolol, propranolol) ?Clonidine ?Guanethidine ?Reserpine ?This list may not describe all possible interactions. Give your health care provider a list of all the medicines, herbs, non-prescription drugs, or dietary supplements you use. Also tell them if you smoke, drink alcohol, or use illegal drugs. Some items may interact with your medicine. ?What should I watch for while using this medication? ?Visit your care team for regular checks on your progress. ?Check with your care team if you have severe diarrhea, nausea, and vomiting, or if you sweat a lot. The loss of too much body fluid may make it dangerous for you to take this medication. ?A test called the HbA1C (A1C) will be monitored. This is a simple blood test. It measures your blood sugar control over the last 2 to 3 months. You will receive this test every 3 to 6 months. ?Learn how to check your blood sugar. Learn the symptoms of low and high blood sugar and how to manage them. ?Always carry a quick-source of sugar with you in case you have symptoms of low blood sugar. Examples include hard sugar candy or glucose tablets. Make sure others know that you can choke if you eat or drink when you develop serious symptoms of low blood sugar, such as seizures or unconsciousness. Get medical help at once. ?Tell your care team if you have high blood sugar. You might need to change the dose of your medication. If you are sick or exercising more than usual, you might need to change the dose of your medication. ?Do not skip meals. Ask your care team if you should avoid alcohol. Many nonprescription cough and cold products contain sugar or alcohol. These can affect blood  sugar. ?Wear a medical ID bracelet or chain. Carry a card that describes your condition. List the medications and doses you take on the card. ?Do not become pregnant while taking this medication. Women should inform their care team if they wish to become pregnant or think they might be pregnant. There is a potential for serious side effects to an unborn child. Talk to your care team for more information. Do not breast-feed an infant while taking this medication. ?What side effects may I notice from receiving this medication? ?Side effects that you should report to your care team as soon as possible: ?Allergic reactions--skin rash, itching, hives, swelling of the face, lips, tongue, or throat ?Change in vision ?Dehydration--increased thirst, dry mouth, feeling faint or lightheaded, headache, dark yellow or brown urine ?Gallbladder problems--severe stomach pain, nausea, vomiting, fever ?Heart palpitations--rapid, pounding, or irregular heartbeat ?Kidney injury--decrease in the amount of urine, swelling of the ankles, hands, or feet ?Pancreatitis--severe stomach pain that spreads to your back or gets worse after eating or when touched, fever, nausea, vomiting ?Thyroid cancer--new mass or lump in the neck, pain or trouble swallowing, trouble breathing, hoarseness ?Side effects that usually do not require medical attention (report to your care team if they continue or are bothersome): ?Diarrhea ?Loss of appetite ?Nausea ?Stomach pain ?Vomiting ?This list may not describe all possible side effects. Call your doctor for medical advice about  side effects. You may report side effects to FDA at 1-800-FDA-1088. ?Where should I keep my medication? ?Keep out of the reach of children and pets. ?Store at room temperature between 20 and 25 degrees C (68 and 77 degrees F). Keep this medication in the original container. Protect from moisture. Keep the container tightly closed. Get rid of any unused medication after the expiration  date. ?To get rid of medications that are no longer needed or have expired: ?Take the medication to a medication take-back program. Check with your pharmacy or law enforcement to find a location. ?If you c

## 2021-09-08 DIAGNOSIS — R42 Dizziness and giddiness: Secondary | ICD-10-CM | POA: Diagnosis not present

## 2021-09-08 DIAGNOSIS — R531 Weakness: Secondary | ICD-10-CM | POA: Diagnosis not present

## 2021-09-08 DIAGNOSIS — E785 Hyperlipidemia, unspecified: Secondary | ICD-10-CM | POA: Diagnosis not present

## 2021-09-08 DIAGNOSIS — R55 Syncope and collapse: Secondary | ICD-10-CM | POA: Diagnosis not present

## 2021-09-08 DIAGNOSIS — E119 Type 2 diabetes mellitus without complications: Secondary | ICD-10-CM | POA: Diagnosis not present

## 2021-09-08 DIAGNOSIS — R791 Abnormal coagulation profile: Secondary | ICD-10-CM | POA: Diagnosis not present

## 2021-09-08 LAB — URINALYSIS, ROUTINE W REFLEX MICROSCOPIC
Bilirubin Urine: NEGATIVE
Hgb urine dipstick: NEGATIVE
Hyaline Cast: NONE SEEN /LPF
Ketones, ur: NEGATIVE
Nitrite: NEGATIVE
Protein, ur: NEGATIVE
Specific Gravity, Urine: 1.038 — ABNORMAL HIGH (ref 1.001–1.035)
WBC, UA: >=60 /HPF — AB (ref 0–5)
pH: 5.5 (ref 5.0–8.0)

## 2021-09-08 LAB — URINE CULTURE
MICRO NUMBER:: 13259848
SPECIMEN QUALITY:: ADEQUATE

## 2021-09-08 LAB — MICROSCOPIC MESSAGE

## 2021-09-08 NOTE — Telephone Encounter (Signed)
Reviewed blood sugars.  Sugars appear to be improved compared to previous overall sugar check (recent elevated a1c).  Continue low carb diet.  Stay hydrated.  In reviewing chart, was evaluated by  Dr Olivia Mackie 09/07/21.  Off jardiance now.  Confirm feeling better.  Keep [place] posted on how she is doing and how sugars are doing.  Let me know if needs anything or if needs earlier appt ?

## 2021-09-08 NOTE — Telephone Encounter (Signed)
Spoke with pt and she stated that she is feeling much better. She stated that she started on the Rybelsus '3mg'$  tablet this morning and has a months supply that Dr. Olivia Mackie gave her yesterday. Pt stated her fasting blood sugar was 138 this morning and 126 about 2 pm today. She is continuing to check her sugars and record them and will let us know if she needs anything or a sooner appt. ?

## 2021-09-09 DIAGNOSIS — R55 Syncope and collapse: Secondary | ICD-10-CM | POA: Diagnosis not present

## 2021-09-10 ENCOUNTER — Encounter: Payer: Self-pay | Admitting: Internal Medicine

## 2021-09-10 ENCOUNTER — Other Ambulatory Visit: Payer: Self-pay

## 2021-09-10 ENCOUNTER — Emergency Department: Payer: PPO

## 2021-09-10 ENCOUNTER — Emergency Department
Admission: EM | Admit: 2021-09-10 | Discharge: 2021-09-10 | Disposition: A | Discharge status: Home / Self Care | Payer: PPO | Attending: Student in an Organized Health Care Education/Training Program | Admitting: Student in an Organized Health Care Education/Training Program

## 2021-09-10 DIAGNOSIS — R791 Abnormal coagulation profile: Secondary | ICD-10-CM | POA: Diagnosis not present

## 2021-09-10 DIAGNOSIS — R5381 Other malaise: Secondary | ICD-10-CM | POA: Diagnosis not present

## 2021-09-10 DIAGNOSIS — E119 Type 2 diabetes mellitus without complications: Secondary | ICD-10-CM | POA: Diagnosis not present

## 2021-09-10 DIAGNOSIS — E860 Dehydration: Secondary | ICD-10-CM

## 2021-09-10 DIAGNOSIS — R531 Weakness: Secondary | ICD-10-CM | POA: Insufficient documentation

## 2021-09-10 DIAGNOSIS — R7989 Other specified abnormal findings of blood chemistry: Secondary | ICD-10-CM | POA: Insufficient documentation

## 2021-09-10 LAB — COMPREHENSIVE METABOLIC PANEL
ALT: 24 U/L (ref 0–44)
AST: 29 U/L (ref 15–41)
Albumin: 4.2 g/dL (ref 3.5–5.0)
Alkaline Phosphatase: 49 U/L (ref 38–126)
Anion gap: 10 (ref 5–15)
BUN: 20 mg/dL (ref 8–23)
CO2: 26 mmol/L (ref 22–32)
Calcium: 9.8 mg/dL (ref 8.9–10.3)
Chloride: 107 mmol/L (ref 98–111)
Creatinine, Ser: 0.59 mg/dL (ref 0.44–1.00)
GFR, Estimated: >60 mL/min (ref >60)
Glucose, Bld: 130 mg/dL — ABNORMAL HIGH (ref 70–99)
Potassium: 3.6 mmol/L (ref 3.5–5.1)
Sodium: 143 mmol/L (ref 135–145)
Total Bilirubin: 1 mg/dL (ref 0.3–1.2)
Total Protein: 6.8 g/dL (ref 6.5–8.1)

## 2021-09-10 LAB — CBC
HCT: 43.5 % (ref 36.0–46.0)
Hemoglobin: 14.7 g/dL (ref 12.0–15.0)
MCH: 30.9 pg (ref 26.0–34.0)
MCHC: 33.8 g/dL (ref 30.0–36.0)
MCV: 91.4 fL (ref 80.0–100.0)
Platelets: 161 10*3/uL (ref 150–400)
RBC: 4.76 MIL/uL (ref 3.87–5.11)
RDW: 12.6 % (ref 11.5–15.5)
WBC: 6.4 10*3/uL (ref 4.0–10.5)
nRBC: 0 % (ref 0.0–0.2)

## 2021-09-10 LAB — TROPONIN I (HIGH SENSITIVITY)
Troponin I (High Sensitivity): 5 ng/L (ref <18)
Troponin I (High Sensitivity): 6 ng/L (ref <18)

## 2021-09-10 LAB — URINALYSIS, ROUTINE W REFLEX MICROSCOPIC
Bilirubin Urine: NEGATIVE
Glucose, UA: NEGATIVE mg/dL
Hgb urine dipstick: NEGATIVE
Ketones, ur: 5 mg/dL — AB
Leukocytes,Ua: NEGATIVE
Nitrite: NEGATIVE
Protein, ur: NEGATIVE mg/dL
Specific Gravity, Urine: 1.005 (ref 1.005–1.030)
pH: 7 (ref 5.0–8.0)

## 2021-09-10 LAB — TSH: TSH: 1.145 u[IU]/mL (ref 0.350–4.500)

## 2021-09-10 MED ORDER — SODIUM CHLORIDE 0.9 % IV BOLUS
1000.0000 mL | Freq: Once | INTRAVENOUS | Status: AC
Start: 2021-09-10 — End: 2021-09-10
  Administered 2021-09-10: 1000 mL via INTRAVENOUS

## 2021-09-10 NOTE — ED Triage Notes (Signed)
BIB [facility] from home. Pt  has had weakness x 2 hours. Pt also complaint of frequent urination. UA tested thursday and she didn't get results. D dimer was high on thursday so she was senmt to hospital for eval and was told she was normal. Was seen at [facility].  Pt has been taking a new diabetes medicine for about 3 days as well.  ?BGL 138 ?T 97.7 ? ?

## 2021-09-10 NOTE — Discharge Instructions (Signed)
Please seek medical attention for any high fevers, chest pain, shortness of breath, change in behavior, persistent vomiting, bloody stool or any other new or concerning symptoms.  

## 2021-09-10 NOTE — ED Provider Notes (Signed)
? ?[facility] ?Provider Note ? ? ? Event Date/Time  ? First MD Initiated Contact with Patient 09/10/21 1317   ?  (approximate) ? ? ?History  ? ?Weakness ? ? ?HPI ? ?Victoria Harris is a 71 y.o. female history of diabetes presents to the ER for evaluation of generalized malaise feeling weak all over for the past 2 weeks that she recently started [place].  Has had extensive work-up at outpatient clinic with results of D-dimer is told to go to [facility] for CTA which was reassuring.  She is denying any chest pain or pressure and is having increasing urinary frequency and urgency. ?  ? ? ?Physical Exam  ? ?Triage Vital Signs: ?ED Triage Vitals  ?Enc Vitals Group  ?   BP 09/10/21 1320 (!) 177/89  ?   Pulse Rate 09/10/21 1320 61  ?   Resp 09/10/21 1320 16  ?   Temp 09/10/21 1320 98.3 ?F (36.8 ?C)  ?   Temp Source 09/10/21 1320 Oral  ?   SpO2 09/10/21 1320 99 %  ?   Weight 09/10/21 1317 169 lb (76.7 kg)  ?   Height 09/10/21 1317 '5\' 5"'$  (1.651 m)  ?   Head Circumference --   ?   Peak Flow --   ?   Pain Score 09/10/21 1316 0  ?   Pain Loc --   ?   Pain Edu? --   ?   Excl. in [place]? --   ? ? ?Most recent vital signs: ?Vitals:  ? 09/10/21 1445 09/10/21 1500  ?BP: 125/78 (!) 133/91  ?Pulse: 64 (!) 54  ?Resp: (!) 23 10  ?Temp:    ?SpO2: 93% 96%  ? ? ? ?Constitutional: Alert  ?Eyes: Conjunctivae are normal.  ?Head: Atraumatic. ?Nose: No congestion/rhinnorhea. ?Mouth/Throat: Mucous membranes are moist.   ?Neck: Painless ROM.  ?Cardiovascular:   Good peripheral circulation. ?Respiratory: Normal respiratory effort.  No retractions.  ?Gastrointestinal: Soft and nontender in all four quadrants ?Musculoskeletal:  no deformity ?Neurologic:  MAE spontaneously. No gross focal neurologic deficits are appreciated.  ?Skin:  Skin is warm, dry and intact. No rash noted. ?Psychiatric: Mood and affect are normal. Speech and behavior are normal. ? ? ? ?ED Results / Procedures / Treatments  ? ?Labs ?(all labs  ordered are listed, but only abnormal results are displayed) ?Labs Reviewed  ?COMPREHENSIVE METABOLIC PANEL - Abnormal; Notable for the following components:  ?    Result Value  ? Glucose, Bld 130 (*)   ? All other components within normal limits  ?URINALYSIS, ROUTINE W REFLEX MICROSCOPIC - Abnormal; Notable for the following components:  ? Color, Urine YELLOW (*)   ? APPearance HAZY (*)   ? Ketones, ur 5 (*)   ? All other components within normal limits  ?CBC  ?TSH  ?TROPONIN I (HIGH SENSITIVITY)  ?TROPONIN I (HIGH SENSITIVITY)  ? ? ? ?EKG ? ?ED ECG REPORT ?I, Merlyn Lot, the attending physician, personally viewed and interpreted this ECG. ? ? Date: 09/10/2021 ? EKG Time: 13:18 ? Rate: 60 ? Rhythm: sinus ? Axis: normal ? Intervals:normal ? ST&T Change: no stemi, no depressions ? ? ? ?RADIOLOGY ?Please see ED Course for my review and interpretation. ? ?I personally reviewed all radiographic images ordered to evaluate for the above acute complaints and reviewed radiology reports and findings.  These findings were personally discussed with the patient.  Please see medical record for radiology report. ? ? ? ?PROCEDURES: ? ?Critical  Care performed: No ? ?Procedures ? ? ?MEDICATIONS ORDERED IN ED: ?Medications  ?sodium chloride 0.9 % bolus 1,000 mL (has no administration in time range)  ? ? ? ?IMPRESSION / MDM / ASSESSMENT AND PLAN / ED COURSE  ?I reviewed the triage vital signs and the nursing notes. ?             ?               ? ?Differential diagnosis includes, but is not limited to, AKI, DKA, HHS, electrolyte abnormality, anemia, sepsis, UTI, pneumonia, ACS, CHF ? ?Patient presenting with symptoms as described above.  Is afebrile hemodynamically stable.  Does have dry mucous mucous membranes.  Blood work sent for above differential.  She does not have any focal deficits.  She denies any pain.  Possible dehydration. ? ? ?Clinical Course as of 09/10/21 1524  ?Sun Sep 10, 2021  ?1459 Mild hyperglycemia renal  function normal no anemia no UTI troponin negative.  Chest x-ray reassuring no white count.  Clinically very well-appearing no numbness or tingling.  Not consistent with CVA or mass.  [PR]  ?1523 Patient signed out to oncoming physician pending follow-up blood work.  Ultrasound lower extremities ordered as she had elevated D-dimer to complete DVT work-up as an outpatient she is not having any symptoms to suggest DVT.  Anticipate if normal will be appropriate for outpatient follow-up. [PR]  ?  ?Clinical Course User Index ?[PR] Merlyn Lot, MD  ? ? ? ?FINAL CLINICAL IMPRESSION(S) / ED DIAGNOSES  ? ?Final diagnoses:  ?Dehydration  ? ? ? ?Rx / DC Orders  ? ?ED Discharge Orders   ? ? None  ? ?  ? ? ? ?Note:  This document was prepared using Dragon voice recognition software and may include unintentional dictation errors. ? ?  ?Merlyn Lot, MD ?09/10/21 1524 ? ?

## 2021-09-11 NOTE — Telephone Encounter (Signed)
Tell her to stop the rybelsus.  I will work her in.  Confirm eating and staying hydrated.   ?

## 2021-09-11 NOTE — Telephone Encounter (Signed)
Need to know how she is feeling.  Is she doing ok?  I will find a spot to work her in, but I need to know how she is doing - any acute issues now? ?

## 2021-09-11 NOTE — Telephone Encounter (Signed)
Pt confirmed she is drinking and eating ?

## 2021-09-11 NOTE — Telephone Encounter (Signed)
The patient went to the emergency room yesterday. She was instructed to make an appointment with her PCP. I asked the patient if should would like to follow up with Dr. Olivia Mackie she stated she wants to see Dr. Nicki Reaper her provider of 25 years. I do not have an appointment anytime soon. ?

## 2021-09-11 NOTE — Telephone Encounter (Signed)
S/w pt - states she is feeling weak and tired today. ?States she is not taking her diabetic medication ( switched to Rybelsus by Dr Olivia Mackie last week ) ?Stated she has experienced too many side effects, and she feels they are doing more harm then good. ?

## 2021-09-11 NOTE — Telephone Encounter (Signed)
Patient called back regarding appointment. ?

## 2021-09-12 ENCOUNTER — Encounter: Payer: Self-pay | Admitting: Internal Medicine

## 2021-09-12 ENCOUNTER — Ambulatory Visit (INDEPENDENT_AMBULATORY_CARE_PROVIDER_SITE_OTHER): Payer: PPO | Admitting: Internal Medicine

## 2021-09-12 DIAGNOSIS — E7800 Pure hypercholesterolemia, unspecified: Secondary | ICD-10-CM

## 2021-09-12 DIAGNOSIS — D696 Thrombocytopenia, unspecified: Secondary | ICD-10-CM

## 2021-09-12 DIAGNOSIS — E1159 Type 2 diabetes mellitus with other circulatory complications: Secondary | ICD-10-CM

## 2021-09-12 NOTE — Progress Notes (Signed)
[name], female   DOB: 17-Jan-1951, 71 y.o.   MRN: [id number] ? ? ?Subjective:  ? ? [name], female    DOB: 18-Mar-1951, 71 y.o.   MRN: [id number] ? ?This visit occurred during the SARS-CoV-2 public health emergency.  Safety protocols were in place, including screening questions prior to the visit, additional usage of staff PPE, and extensive cleaning of exam room while observing appropriate contact time as indicated for disinfecting solutions.  ? ?Patient here for ER follow up.  ? ?Chief Complaint  ?Patient presents with  ? Follow-up  ?  F/u after ED - pt reports feeling much better off D.M medications, does state she is slightly light headed.  ? .  ? ?HPI ?Recently evaluated - elevated blood sugars.  Started on jardiance.  Was fishing (on a boat recently).  Did not feel right.  Sat down.  Ate candy.  Does not remember fully.  Family present.  No seizure activity.  Felt sick on her stomach.  Had bowel movement.  Rested the next couple of days.  Several days later - shopping - noticed increased urination.  Felt a weak feeling again.  Drove herself to the office.  Evaluated by Dr Aundra Dubin.  Was taken off jardiance.  Started on rybelsus.  Ddimer was checked.  Positive.  Evaluated.  CT chest negative.  Started rybelsus.  Continued to feel weak.  Noticed some chills.  Increased urination.  To ER /16/23.  Urine ok.  Troponin negative.  Ultrasound - negative DVT.  Discharged.  States since discharge, she feels some better.  Energy getting better.  Trying to stay hydrated.  Eating.  No nausea or vomiting.  No chest pain.  No cough or congestion.   ? ? ?Past Medical History:  ?Diagnosis Date  ? Colon polyps   ? hyperplastic  ? Diabetes mellitus without complication ([place])   ? Diverticulosis   ? Hx of adenomatous colonic polyps   ? tubular adenoma  ? Hypercholesterolemia   ? Serrated adenoma of colon   ? Stroke (cerebrum) ([place])   ? Thrombocytopenia ([place])   ? Uterus descensus    ? ?Past Surgical History:  ?Procedure Laterality Date  ? ABDOMINAL HYSTERECTOMY  2009  ? CHOLECYSTECTOMY  2007  ? COLONOSCOPY N/A 04/05/2015  ? Procedure: COLONOSCOPY;  Surgeon: Lollie Sails, MD;  Location: [facility] ENDOSCOPY;  Service: Endoscopy;  Laterality: N/A;  ? COLONOSCOPY WITH PROPOFOL N/A 10/09/2016  ? Procedure: COLONOSCOPY WITH PROPOFOL;  Surgeon: Lollie Sails, MD;  Location: [facility] ENDOSCOPY;  Service: Endoscopy;  Laterality: N/A;  ? COLONOSCOPY WITH PROPOFOL N/A 12/02/2019  ? Procedure: COLONOSCOPY WITH PROPOFOL;  Surgeon: Toledo, Benay Pike, MD;  Location: [facility] ENDOSCOPY;  Service: Gastroenterology;  Laterality: N/A;  ? Lucky OF UTERUS  2001 and 2006  ? FOOT SURGERY  2007  ? TONSILLECTOMY  1960  ? TUBAL LIGATION    ? vocal cord cyst removed    ? ?Family History  ?Problem Relation Age of Onset  ? Colon cancer Mother   ? Diabetes Mother   ? Kidney disease Mother   ? Heart disease Mother   ? Prostate cancer Father   ? Diabetes Father   ? Glaucoma Father   ? Uterine cancer Maternal Aunt   ? Breast cancer Paternal Aunt   ?     x2  ? Stroke Paternal Grandmother   ? Stroke Paternal Grandfather   ? Diabetes Brother   ? Diabetes Brother   ?  Diabetes Other   ? ?Social History  ? ?Socioeconomic History  ? Marital status: Married  ?  Spouse name: Not on file  ? Number of children: Not on file  ? Years of education: Not on file  ? Highest education level: Not on file  ?Occupational History  ? Not on file  ?Tobacco Use  ? Smoking status: Never  ? Smokeless tobacco: Never  ?Vaping Use  ? Vaping Use: Never used  ?Substance and Sexual Activity  ? Alcohol use: No  ?  Alcohol/week: 0.0 standard drinks  ? Drug use: No  ? Sexual activity: Not Currently  ?Other Topics Concern  ? Not on file  ?Social History Narrative  ? Not on file  ? ?Social Determinants of Health  ? ?Financial Resource Strain: Low Risk   ? Difficulty of Paying Living Expenses: Not hard at all  ?Food Insecurity: No Food Insecurity  ?  Worried About Charity fundraiser in the Last Year: Never true  ? Ran Out of Food in the Last Year: Never true  ?Transportation Needs: No Transportation Needs  ? Lack of Transportation (Medical): No  ? Lack of Transportation (Non-Medical): No  ?Physical Activity: Insufficiently Active  ? Days of Exercise per Week: 3 days  ? Minutes of Exercise per Session: 20 min  ?Stress: No Stress Concern Present  ? Feeling of Stress : Not at all  ?Social Connections: Unknown  ? Frequency of Communication with Friends and Family: Not on file  ? Frequency of Social Gatherings with Friends and Family: Not on file  ? Attends Religious Services: Not on file  ? Active Member of Clubs or Organizations: Not on file  ? Attends Archivist Meetings: Not on file  ? Marital Status: Married  ? ? ? ?Review of Systems  ?Constitutional:  Positive for fatigue. Negative for unexpected weight change.  ?HENT:  Negative for congestion and sinus pressure.   ?Respiratory:  Negative for cough, chest tightness and shortness of breath.   ?Cardiovascular:  Negative for chest pain, palpitations and leg swelling.  ?Gastrointestinal:  Negative for abdominal pain, diarrhea, nausea and vomiting.  ?Genitourinary:  Negative for difficulty urinating and dysuria.  ?Musculoskeletal:  Negative for joint swelling and myalgias.  ?Skin:  Negative for color change and rash.  ?Neurological:  Negative for dizziness, light-headedness and headaches.  ?Psychiatric/Behavioral:  Negative for agitation and dysphoric mood.   ? ?   ?Objective:  ?  ? ?BP 140/84 (BP Location: Left Arm, Patient Position: Sitting, Cuff Size: Small)   Pulse 60   Temp 98.2 ?F (36.8 ?C) (Temporal)   Resp 16   Ht '5\' 5"'$  (1.651 m)   Wt 169 lb (76.7 kg)   SpO2 94%   BMI 28.12 kg/m?  ?Wt Readings from Last 3 Encounters:  ?09/12/21 169 lb (76.7 kg)  ?09/10/21 169 lb (76.7 kg)  ?08/24/21 172 lb 9.6 oz (78.3 kg)  ? ? ?Physical Exam ?Vitals reviewed.  ?Constitutional:   ?   General: She is not in  acute distress. ?   Appearance: Normal appearance.  ?HENT:  ?   Head: Normocephalic and atraumatic.  ?   Right Ear: External ear normal.  ?   Left Ear: External ear normal.  ?Eyes:  ?   General: No scleral icterus.    ?   Right eye: No discharge.     ?   Left eye: No discharge.  ?   Conjunctiva/sclera: Conjunctivae normal.  ?Neck:  ?  Thyroid: No thyromegaly.  ?Cardiovascular:  ?   Rate and Rhythm: Normal rate and regular rhythm.  ?Pulmonary:  ?   Effort: No respiratory distress.  ?   Breath sounds: Normal breath sounds. No wheezing.  ?Abdominal:  ?   General: Bowel sounds are normal.  ?   Palpations: Abdomen is soft.  ?   Tenderness: There is no abdominal tenderness.  ?Musculoskeletal:     ?   General: No swelling or tenderness.  ?   Cervical back: Neck supple. No tenderness.  ?Lymphadenopathy:  ?   Cervical: No cervical adenopathy.  ?Skin: ?   Findings: No erythema or rash.  ?Neurological:  ?   Mental Status: She is alert.  ?Psychiatric:     ?   Mood and Affect: Mood normal.     ?   Behavior: Behavior normal.  ? ? ? ?Outpatient Encounter Medications as of 09/12/2021  ?Medication Sig  ? aspirin 81 MG tablet Take 81 mg by mouth daily.  ? Cholecalciferol 25 MCG (1000 UT) tablet Take 1,000 Units by mouth daily.   ? colestipol (COLESTID) 1 g tablet Take 1 g by mouth daily.   ? dorzolamide-timolol (COSOPT) 22.3-6.8 MG/ML ophthalmic solution Place 1 drop into both eyes 2 (two) times daily.  ? mirabegron ER (MYRBETRIQ) 25 MG TB24 tablet Take 1 tablet (25 mg total) by mouth daily.  ? Multiple Vitamins-Minerals (MULTIVITAMIN ADULT PO) Take by mouth daily.  ? ONETOUCH VERIO test strip USE AS INSTRUCTED TO CHECK BLOOD SUGARS DAILY. DX E11.9  ? Probiotic Product (PROBIOTIC DAILY) CAPS Take 1 capsule by mouth daily.  ? rosuvastatin (CRESTOR) 5 MG tablet TAKE 1 TABLET (5 MG TOTAL) BY MOUTH DAILY.  ? TRAVATAN Z 0.004 % SOLN ophthalmic solution Place 1 drop into both eyes at bedtime.  ? triamcinolone ointment (KENALOG) 0.5 % Apply  1 application topically 2 (two) times daily. For itching.  ? [DISCONTINUED] Semaglutide (RYBELSUS) 3 MG TABS Take 3 mg by mouth daily. (Patient not taking: Reported on 09/12/2021)  ? [DISCONTINUED] Semaglutide (RYBE

## 2021-09-12 NOTE — Telephone Encounter (Signed)
Appt scheduled for 09/12/21 at 8:30.  ?

## 2021-09-13 ENCOUNTER — Telehealth: Payer: Self-pay

## 2021-09-13 NOTE — Telephone Encounter (Signed)
I called patient in regards to PA for Rybelsus 7 mg. She stated that she had recently been very sick & cause was still unknown. She did see Dr. Nicki Reaper yesterday & she is on hold with all diabetes medications. She was unsure if she would resume taking the Rybelsus & she was also taking the 3 mg as she had some samples. For now I have deleted out PA.  ?

## 2021-09-13 NOTE — Telephone Encounter (Signed)
Pt may do ozempic ? If tolerating rybelsus ?  ?Hold rybelsus 3 mg for now and hold PA for this medication per sarah pates ? ?

## 2021-09-13 NOTE — Telephone Encounter (Signed)
Close  

## 2021-09-14 ENCOUNTER — Encounter: Payer: Self-pay | Admitting: Podiatry

## 2021-09-14 ENCOUNTER — Ambulatory Visit: Payer: PPO | Admitting: Podiatry

## 2021-09-14 DIAGNOSIS — M79675 Pain in left toe(s): Secondary | ICD-10-CM | POA: Diagnosis not present

## 2021-09-14 DIAGNOSIS — B351 Tinea unguium: Secondary | ICD-10-CM

## 2021-09-14 DIAGNOSIS — E119 Type 2 diabetes mellitus without complications: Secondary | ICD-10-CM | POA: Diagnosis not present

## 2021-09-14 DIAGNOSIS — M79674 Pain in right toe(s): Secondary | ICD-10-CM | POA: Diagnosis not present

## 2021-09-14 DIAGNOSIS — M2012 Hallux valgus (acquired), left foot: Secondary | ICD-10-CM

## 2021-09-14 DIAGNOSIS — L84 Corns and callosities: Secondary | ICD-10-CM | POA: Insufficient documentation

## 2021-09-14 DIAGNOSIS — D696 Thrombocytopenia, unspecified: Secondary | ICD-10-CM | POA: Diagnosis not present

## 2021-09-14 NOTE — Progress Notes (Signed)
This patient returns to my office for at risk foot care.  This patient requires this care by a professional since this patient will be at risk due to having diabetes and throbocytopenia..    This patient is unable to cut nails herself since the patient cannot reach her nails.These nails are painful walking and wearing shoes.  This patient presents for at risk foot care today.  General Appearance  Alert, conversant and in no acute stress.  Vascular  Dorsalis pedis and posterior tibial  pulses are palpable  bilaterally.  Capillary return is within normal limits  bilaterally. Temperature is within normal limits  bilaterally.  Neurologic  Senn-Weinstein monofilament wire test within normal limits  bilaterally. Muscle power within normal limits bilaterally.  Nails Thick disfigured discolored nails with subungual debris  from hallux to fifth toes bilaterally. No evidence of bacterial infection or drainage bilaterally.  Orthopedic  No limitations of motion  feet .  No crepitus or effusions noted.  No bony pathology or digital deformities noted. Plantar flexed 2,3 right foot.  Skin  normotropic skin  noted bilaterally.  No signs of infections or ulcers noted.   Asymptomatic porokeratosis sub 2 right foot.  Onychomycosis  Pain in right toes  Pain in left toes  Consent was obtained for treatment procedures.   Mechanical debridement of nails 1-5  bilaterally performed with a nail nipper.  Filed with dremel without incident.    Return office visit  4 months                   Told patient to return for periodic foot care and evaluation due to potential at risk complications.   Zandrea Kenealy DPM  

## 2021-09-15 NOTE — Telephone Encounter (Signed)
Pt called stating she reporting her sugar readings. Pt said she is feeling better ?Wed morning 167 during the day 138 and mid afternoon 155 ?Thursday morning 140 then at 4:25 pm it was 99  ?Lat night at 8:55 it was 178  ?Friday morning 160  ?BP 115/70 and 109/63 this morning ?

## 2021-09-15 NOTE — Telephone Encounter (Signed)
S/w pt - advised sugar ok, remain off meds. ?Will continue diet, very happy with it. ?Will monitor and call back Wednesday with update.  ?Advised to let me know if any issues. ?

## 2021-09-15 NOTE — Telephone Encounter (Signed)
Please call.  Sugars ok.  Remain off sugar medication. Continue diet.  We will follow.  Continue to monitor sugars and call with update. Also, let us know if any problems.  ?

## 2021-09-16 ENCOUNTER — Encounter: Payer: Self-pay | Admitting: Internal Medicine

## 2021-09-16 NOTE — Assessment & Plan Note (Signed)
Follow cbc.  

## 2021-09-16 NOTE — Assessment & Plan Note (Signed)
Bowels better off metformin.  Recent a1c elevated.  Did not tolerate jardiance or rybelsus.  Watching her diet.  Will remain off medication for now.  Have her spot check sugars.  Send in readings.  Stay hydrated. Call with update over the next few days.   ?

## 2021-09-16 NOTE — Assessment & Plan Note (Signed)
Continue crestor.  Low cholesterol diet and exercise. Follow lipid panel and liver function tests.   

## 2021-09-19 DIAGNOSIS — L578 Other skin changes due to chronic exposure to nonionizing radiation: Secondary | ICD-10-CM | POA: Diagnosis not present

## 2021-09-19 DIAGNOSIS — L918 Other hypertrophic disorders of the skin: Secondary | ICD-10-CM | POA: Diagnosis not present

## 2021-09-19 DIAGNOSIS — Z86018 Personal history of other benign neoplasm: Secondary | ICD-10-CM | POA: Diagnosis not present

## 2021-09-19 DIAGNOSIS — L821 Other seborrheic keratosis: Secondary | ICD-10-CM | POA: Diagnosis not present

## 2021-09-19 DIAGNOSIS — L300 Nummular dermatitis: Secondary | ICD-10-CM | POA: Diagnosis not present

## 2021-09-19 DIAGNOSIS — Z85828 Personal history of other malignant neoplasm of skin: Secondary | ICD-10-CM | POA: Diagnosis not present

## 2021-09-19 DIAGNOSIS — L570 Actinic keratosis: Secondary | ICD-10-CM | POA: Diagnosis not present

## 2021-09-19 NOTE — Telephone Encounter (Signed)
Pt called about her sugars levels ?Friday morning 160, 4:40 pm-91, 8:21 pm 137 ?Satuday morning 131, 10 am-154, 2:23 pm 158, 8:39 pm 138 ?Sunday morning 121, 3 pm-184, 9:58 pm-106 ?Monday morning 133, 2:04 pm 144, 8:32 pm 166 ?Tuesday morning 141, 11:12 am 121  ?122/74-BP and pt is feeling great ? ? ?

## 2021-09-19 NOTE — Telephone Encounter (Signed)
Blood sugars are overall ok.  Continue to keep [place] posted.  Glad she is feeling better. Let us know if needs anything ?

## 2021-09-21 NOTE — Telephone Encounter (Signed)
Pt advised. ?Will continue to keep [place] updated and will call if needs anything or anything adverse happens. ?

## 2021-09-26 NOTE — Telephone Encounter (Signed)
Pt called in about blood sugar levels... ?April 26: 113 at 6:50am, 154 at 9:44am, 184 at 10:05pm - 1 hour after she ate... ?April 27: 130 at 6:26am, 79 at 5:08pm, 234 at 8:53pm ate chinese food... ?April 28: 119 at 6:41am, 158 at 10:30am, 153 at 4:00pm, 113 at 7pm....  ?April 29: 152 at 6:37am, 139 at 10:20am, 176 at 6:23pm..... ?April 30: 151 at 6:29am, 154 at 9:26am, 201 at 3:21pm (after lunch), 97 at 8:16pm, 181 at 10:50pm (Pt stated she might have ate something)... ?May 1: 128 at 6:18am, 157 at 9:45am, 177 at 9:35pm... ?May 2: 178 at 6:35am, 127 at 10:12am, ? ?Pt stated that blood pressure is still good and she have been feeling great.... Yesterday BP was 116/70...  ? ? ?

## 2021-09-27 ENCOUNTER — Telehealth: Payer: Self-pay

## 2021-09-27 NOTE — Telephone Encounter (Signed)
Pt advised.

## 2021-09-27 NOTE — Telephone Encounter (Signed)
S/w pt - advised readings looking good. ?Continue monitoring and watching diet. ?Keep [place] updated. ?Pt agrees. ?

## 2021-09-27 NOTE — Telephone Encounter (Signed)
Reviewed sugars.  Sugars ok.  Continue low carb diet and exercise.  We will follow.  ?

## 2021-09-27 NOTE — Telephone Encounter (Signed)
Patient returned phone call from office.  Patient said she thinks it may be Luna Fuse trying to reach her.  Please call. ?

## 2021-10-04 NOTE — Telephone Encounter (Signed)
Reviewed sugars.  Readings are ok.  Continue to monitor.   ?

## 2021-10-04 NOTE — Telephone Encounter (Signed)
5/3-152 @ 6:30 am, 111 @ 11:42 am, 125 @ 3:40 pm ? ?5/4- 133 @ 626 am, 144 @ 9:32 am, 97@ 4:50 pm, 187 @ 9:07 pm ? ?5/5- 136 @ 624 am, 176 @ 9:57 am, 178 @ 2:30 pm, 133 @ 7:19 pm ? ?5/6- 145 @ 623 am, 150 @ 10:05 am, 177 @ 230 pm, 161 @ 9:50 pm ? ?5/7 139 @ 6:17 am, 179 @ 237 pm ? ?5/8- 142 @ 655 am, 82 @ 425 pm, 166 '@10'$  pm ? ?5/9-153 @ 632 am, 178 @ 953 am, 172 @ 519 pm, 112 @ 932 pm ? ?5/10 129 628 am ? ?

## 2021-10-05 NOTE — Telephone Encounter (Signed)
Pt advised - will continue to monitor and report ?

## 2021-10-10 NOTE — Telephone Encounter (Signed)
Pt called in about blood sugar levels:  ?May 11: 128 at 6:22am, 104 at 4:19pm (before she ate), 160 at 10:05pm... ?May 12: 139 at 6:33am, 120 at 10:30am, 152 at 3:29pm, 161 at 10:53pm... ?May 13:157 at 6:32am, 142 at 10am, 156 at 9:25pm... ?May 14: 143 at 6:31am, 137 at 4:36pm, 151 at 10:25pm... ?May 15: 141 at 6:02am, 154 at 9:59am, 129 at 5:19pm, 137 at 10:13pm... ?May 16: 123 at 6:26am, 180 at 9:41am ? ?Pt stated that she will call next week to give the other readings...  ?

## 2021-10-10 NOTE — Telephone Encounter (Signed)
Thanks for calling in.  Sugars are overall doing better.  Continue low carb diet and exercise.  Remain off medication.  Let us know if any problems.  ?

## 2021-10-10 NOTE — Telephone Encounter (Signed)
Pt advised continue diet and exercise.  ?Will continue to monitor and report  ?

## 2021-10-19 ENCOUNTER — Telehealth: Payer: Self-pay

## 2021-10-19 NOTE — Telephone Encounter (Signed)
Notify - her sugar overall appears to be doing ok.  Continue to monitor.

## 2021-10-19 NOTE — Telephone Encounter (Signed)
Pt advised. Will continue to monitor and let us know.

## 2021-10-19 NOTE — Telephone Encounter (Signed)
Patient states she would like to give [place] her sugar readings for Dr. Einar Pheasant.  Wed, 10/11/2021 -  3:30am - 126 (patient states she had a flight that morning and took it before she went to the airport) 6:35pm - 125  Thurs., 10/12/2021 -  6:23am - 131    10:50am - 152    9:34pm - 131  Fri., 10/13/2021 -  6:45am - 120    3:29pm - 159    10:30pm - 205 (patient states her grandson graduated and they had a late reception where she ate half a cupcake and some chips with pork, salsa, sour cream, and jalapenos.)  Sat., 10/14/2021- 7:10am - 125    10:50am - 168 (Patient states she had no other readings for this day because her new pack of strips were giving her an error message)  Nancy Fetter., 10/15/2021 - Patient states no readings because of error message.   Mon., 10/16/2021 -  6:52am - 153 (Patient states no other readings for this day because she was flying back)  Tues., 10/17/2021 -  7:14am - 124    10:54am - 202    12:11pm - 134 (Patient states she had a funeral that day, so no other readings)  Wed., 10/18/2021 6:52am - 164    11:18am - 162    6:08pm - 157  Thurs., 10/19/2021 6:43am - 143    9:44am - 165

## 2021-11-01 ENCOUNTER — Telehealth: Payer: Self-pay

## 2021-11-01 NOTE — Telephone Encounter (Signed)
Patient called to give [place] her sugar readings:  10/20/2021 (Fri.) - 137 6:49am    215 4:45pm 10/21/2021 (Sat.) -  155 6:32am    152 4:10pm 10/22/2021 (Sun.) -  148 6:01am    183 9:21pm 10/23/2021 (Mon.) -  164 6:36am    166 8:46pm 10/24/2021 (Tues.) -  169 6:22am    191 8:47pm 10/25/2021 (Wed.) -  152 6:53am    174 3:36pm 10/26/2021 (Thurs.) -  141 6:30am      154 8:55pm 10/27/2021 (Fri.) -  143 6:31am    167 8:55pm 10/28/2021 (Sat.) -  184 6:45am    165 10:50am    169 10:25pm 10/29/2021 Nancy Fetter.) -  155 6:14am    Patient states did not check sugar again on Sunday. 10/30/2021 (Mon.) -  151 7:02am    139 5:26pm 10/31/2021 (Tues.) -  151 6:28am    109 9:43pm 11/01/2021 (Wed.) -  138 6:24am    163 10:01am  Patient states she is going on vacation next week, 11/06/2021 and will return on 11/14/2021.  Patient states she is packing some good snacks to eat.  Patient states she may not be able to check her sugars as frequently while she is on vacation.

## 2021-11-01 NOTE — Telephone Encounter (Signed)
Pt advised.

## 2021-11-01 NOTE — Telephone Encounter (Signed)
Overall sugars appear to be doing ok.  Let us know if any problems.

## 2021-11-15 ENCOUNTER — Other Ambulatory Visit: Payer: Self-pay | Admitting: Internal Medicine

## 2021-11-15 DIAGNOSIS — Z1231 Encounter for screening mammogram for malignant neoplasm of breast: Secondary | ICD-10-CM

## 2021-11-21 ENCOUNTER — Other Ambulatory Visit (INDEPENDENT_AMBULATORY_CARE_PROVIDER_SITE_OTHER): Payer: PPO

## 2021-11-21 DIAGNOSIS — E7800 Pure hypercholesterolemia, unspecified: Secondary | ICD-10-CM | POA: Diagnosis not present

## 2021-11-21 DIAGNOSIS — E1159 Type 2 diabetes mellitus with other circulatory complications: Secondary | ICD-10-CM

## 2021-11-21 LAB — BASIC METABOLIC PANEL
BUN: 19 mg/dL (ref 6–23)
CO2: 27 mEq/L (ref 19–32)
Calcium: 9.4 mg/dL (ref 8.4–10.5)
Chloride: 106 mEq/L (ref 96–112)
Creatinine, Ser: 0.58 mg/dL (ref 0.40–1.20)
GFR: 90.66 mL/min (ref >60.00)
Glucose, Bld: 157 mg/dL — ABNORMAL HIGH (ref 70–99)
Potassium: 4.1 mEq/L (ref 3.5–5.1)
Sodium: 140 mEq/L (ref 135–145)

## 2021-11-21 LAB — LIPID PANEL
Cholesterol: 155 mg/dL (ref 0–200)
HDL: 54.7 mg/dL (ref >39.00)
LDL Cholesterol: 80 mg/dL (ref 0–99)
NonHDL: 99.87
Total CHOL/HDL Ratio: 3
Triglycerides: 98 mg/dL (ref 0.0–149.0)
VLDL: 19.6 mg/dL (ref 0.0–40.0)

## 2021-11-21 LAB — HEPATIC FUNCTION PANEL
ALT: 21 U/L (ref 0–35)
AST: 22 U/L (ref 0–37)
Albumin: 4.3 g/dL (ref 3.5–5.2)
Alkaline Phosphatase: 55 U/L (ref 39–117)
Bilirubin, Direct: 0.1 mg/dL (ref 0.0–0.3)
Total Bilirubin: 0.8 mg/dL (ref 0.2–1.2)
Total Protein: 6.2 g/dL (ref 6.0–8.3)

## 2021-11-21 LAB — HEMOGLOBIN A1C: Hgb A1c MFr Bld: 7 % — ABNORMAL HIGH (ref 4.6–6.5)

## 2021-11-23 ENCOUNTER — Telehealth: Payer: Self-pay

## 2021-11-23 DIAGNOSIS — H401111 Primary open-angle glaucoma, right eye, mild stage: Secondary | ICD-10-CM | POA: Diagnosis not present

## 2021-11-23 LAB — HM DIABETES EYE EXAM

## 2021-11-23 NOTE — Telephone Encounter (Signed)
Pt advised.

## 2021-11-23 NOTE — Telephone Encounter (Signed)
Patient states she would like to know if we have her lab results.  Patient states she is especially interested in knowing her A1c result.  Patient states she has an appointment with Dr. Einar Pheasant tomorrow, but she doesn't like negative surprises, so she would like to go ahead and find out ahead of time.  Please call.

## 2021-11-23 NOTE — Telephone Encounter (Signed)
Please call and notify her that her a1c has improved - a1c 7.0 (down from 8.2).

## 2021-11-24 ENCOUNTER — Encounter: Payer: Self-pay | Admitting: Internal Medicine

## 2021-11-24 ENCOUNTER — Ambulatory Visit (INDEPENDENT_AMBULATORY_CARE_PROVIDER_SITE_OTHER): Payer: PPO | Admitting: Internal Medicine

## 2021-11-24 VITALS — BP 122/74 | HR 64 | Temp 97.7°F | Ht 65.0 in | Wt 171.0 lb

## 2021-11-24 DIAGNOSIS — Z8673 Personal history of transient ischemic attack (TIA), and cerebral infarction without residual deficits: Secondary | ICD-10-CM | POA: Diagnosis not present

## 2021-11-24 DIAGNOSIS — D696 Thrombocytopenia, unspecified: Secondary | ICD-10-CM | POA: Diagnosis not present

## 2021-11-24 DIAGNOSIS — E7800 Pure hypercholesterolemia, unspecified: Secondary | ICD-10-CM | POA: Diagnosis not present

## 2021-11-24 DIAGNOSIS — E1159 Type 2 diabetes mellitus with other circulatory complications: Secondary | ICD-10-CM

## 2021-11-24 DIAGNOSIS — Z0000 Encounter for general adult medical examination without abnormal findings: Secondary | ICD-10-CM | POA: Diagnosis not present

## 2021-11-24 DIAGNOSIS — Z86010 Personal history of colonic polyps: Secondary | ICD-10-CM

## 2021-11-24 NOTE — Progress Notes (Unsigned)
[name], female   DOB: 04/14/1951, 71 y.o.   MRN: [id number]   Subjective:    [name], female    DOB: [birth year], 71 y.o.   MRN: [id number]   Patient here for her physical exam.   Chief Complaint  Patient presents with   Annual Exam   .   HPI A1c was elevated last check.  She has adjusted her diet.  Decreased carbs/sugars.  Stays active.  A1c improved 7.0 now. No chest pain or sob reported.  No abdominal pain or bowel change reported.  Saw eye MD yesterday.  Everything ok.  Sugars in am 130.  Mostly less than 180 in the evening.  Overall she feels she is doing well.    Past Medical History:  Diagnosis Date   Colon polyps    hyperplastic   Diabetes mellitus without complication ([place])    Diverticulosis    Hx of adenomatous colonic polyps    tubular adenoma   Hypercholesterolemia    Serrated adenoma of colon    Stroke (cerebrum) ([place])    Thrombocytopenia ([place])    Uterus descensus    Past Surgical History:  Procedure Laterality Date   ABDOMINAL HYSTERECTOMY  2009   CHOLECYSTECTOMY  2007   COLONOSCOPY N/A 04/05/2015   Procedure: COLONOSCOPY;  Surgeon: Lollie Sails, MD;  Location: [facility] ENDOSCOPY;  Service: Endoscopy;  Laterality: N/A;   COLONOSCOPY WITH PROPOFOL N/A 10/09/2016   Procedure: COLONOSCOPY WITH PROPOFOL;  Surgeon: Lollie Sails, MD;  Location: [facility] ENDOSCOPY;  Service: Endoscopy;  Laterality: N/A;   COLONOSCOPY WITH PROPOFOL N/A 12/02/2019   Procedure: COLONOSCOPY WITH PROPOFOL;  Surgeon: Toledo, Benay Pike, MD;  Location: [facility] ENDOSCOPY;  Service: Gastroenterology;  Laterality: N/A;   DILATION AND CURETTAGE OF UTERUS  2001 and 2006   FOOT SURGERY  2007   TONSILLECTOMY  1960   TUBAL LIGATION     vocal cord cyst removed     Family History  Problem Relation Age of Onset   Colon cancer Mother    Diabetes Mother    Kidney disease Mother    Heart disease Mother    Prostate cancer Father    Diabetes Father     Glaucoma Father    Uterine cancer Maternal Aunt    Breast cancer Paternal Aunt        x2   Stroke Paternal Grandmother    Stroke Paternal Grandfather    Diabetes Brother    Diabetes Brother    Diabetes Other    Social History   Socioeconomic History   Marital status: Married    Spouse name: Not on file   Number of children: Not on file   Years of education: Not on file   Highest education level: Not on file  Occupational History   Not on file  Tobacco Use   Smoking status: Never   Smokeless tobacco: Never  Vaping Use   Vaping Use: Never used  Substance and Sexual Activity   Alcohol use: No    Alcohol/week: 0.0 standard drinks of alcohol   Drug use: No   Sexual activity: Not Currently  Other Topics Concern   Not on file  Social History Narrative   Not on file   Social Determinants of Health   Financial Resource Strain: Low Risk  (04/26/2021)   Overall Financial Resource Strain (CARDIA)    Difficulty of Paying Living Expenses: Not hard at all  Food Insecurity: No Food Insecurity (04/26/2021)  Hunger Vital Sign    Worried About Running Out of Food in the Last Year: Never true    Ran Out of Food in the Last Year: Never true  Transportation Needs: No Transportation Needs (04/26/2021)   PRAPARE - Hydrologist (Medical): No    Lack of Transportation (Non-Medical): No  Physical Activity: Insufficiently Active (04/26/2021)   Exercise Vital Sign    Days of Exercise per Week: 3 days    Minutes of Exercise per Session: 20 min  Stress: No Stress Concern Present (04/26/2021)   [place]    Feeling of Stress : Not at all  Social Connections: Unknown (04/26/2021)   Social Connection and Isolation Panel [NHANES]    Frequency of Communication with Friends and Family: Not on file    Frequency of Social Gatherings with Friends and Family: Not on file    Attends Religious Services:  Not on file    Active Member of Clubs or Organizations: Not on file    Attends Archivist Meetings: Not on file    Marital Status: Married     Review of Systems  Constitutional:  Negative for appetite change and unexpected weight change.  HENT:  Negative for congestion, sinus pressure and sore throat.   Eyes:  Negative for pain and visual disturbance.  Respiratory:  Negative for cough, chest tightness and shortness of breath.   Cardiovascular:  Negative for chest pain, palpitations and leg swelling.  Gastrointestinal:  Negative for abdominal pain, diarrhea, nausea and vomiting.  Genitourinary:  Negative for difficulty urinating and dysuria.  Musculoskeletal:  Negative for joint swelling and myalgias.  Skin:  Negative for color change and rash.  Neurological:  Negative for dizziness, light-headedness and headaches.  Hematological:  Negative for adenopathy. Does not bruise/bleed easily.  Psychiatric/Behavioral:  Negative for agitation and dysphoric mood.        Objective:     BP 122/74 (BP Location: Left Arm, Patient Position: Sitting, Cuff Size: Normal)   Pulse 64   Temp 97.7 F (36.5 C) (Oral)   Ht 5' 5"  (1.651 m)   Wt 171 lb (77.6 kg)   SpO2 96%   BMI 28.46 kg/m  Wt Readings from Last 3 Encounters:  11/24/21 171 lb (77.6 kg)  09/12/21 169 lb (76.7 kg)  09/10/21 169 lb (76.7 kg)    Physical Exam Vitals reviewed.  Constitutional:      General: She is not in acute distress.    Appearance: Normal appearance. She is well-developed.  HENT:     Head: Normocephalic and atraumatic.     Right Ear: External ear normal.     Left Ear: External ear normal.  Eyes:     General: No scleral icterus.       Right eye: No discharge.        Left eye: No discharge.     Conjunctiva/sclera: Conjunctivae normal.  Neck:     Thyroid: No thyromegaly.  Cardiovascular:     Rate and Rhythm: Normal rate and regular rhythm.  Pulmonary:     Effort: No tachypnea, accessory muscle  usage or respiratory distress.     Breath sounds: Normal breath sounds. No decreased breath sounds or wheezing.  Chest:  Breasts:    Right: No inverted nipple, mass, nipple discharge or tenderness (no axillary adenopathy).     Left: No inverted nipple, mass, nipple discharge or tenderness (no axilarry adenopathy).  Abdominal:  General: Bowel sounds are normal.     Palpations: Abdomen is soft.     Tenderness: There is no abdominal tenderness.  Musculoskeletal:        General: No swelling or tenderness.     Cervical back: Neck supple.  Lymphadenopathy:     Cervical: No cervical adenopathy.  Skin:    Findings: No erythema or rash.  Neurological:     Mental Status: She is alert and oriented to person, place, and time.  Psychiatric:        Mood and Affect: Mood normal.        Behavior: Behavior normal.      Outpatient Encounter Medications as of 11/24/2021  Medication Sig   aspirin 81 MG tablet Take 81 mg by mouth daily.   Cholecalciferol 25 MCG (1000 UT) tablet Take 1,000 Units by mouth daily.    dorzolamide-timolol (COSOPT) 22.3-6.8 MG/ML ophthalmic solution Place 1 drop into both eyes 2 (two) times daily.   Multiple Vitamins-Minerals (MULTIVITAMIN ADULT PO) Take by mouth daily.   ONETOUCH VERIO test strip USE AS INSTRUCTED TO CHECK BLOOD SUGARS DAILY. DX E11.9   Probiotic Product (PROBIOTIC DAILY) CAPS Take 1 capsule by mouth daily.   rosuvastatin (CRESTOR) 5 MG tablet TAKE 1 TABLET (5 MG TOTAL) BY MOUTH DAILY.   triamcinolone ointment (KENALOG) 0.5 % Apply 1 application topically 2 (two) times daily. For itching.   latanoprost (XALATAN) 0.005 % ophthalmic solution 1 drop at bedtime.   [DISCONTINUED] colestipol (COLESTID) 1 g tablet Take 1 g by mouth daily.    [DISCONTINUED] mirabegron ER (MYRBETRIQ) 25 MG TB24 tablet Take 1 tablet (25 mg total) by mouth daily. (Patient not taking: Reported on 11/24/2021)   [DISCONTINUED] TRAVATAN Z 0.004 % SOLN ophthalmic solution Place 1 drop  into both eyes at bedtime. (Patient not taking: Reported on 11/24/2021)   No facility-administered encounter medications on file as of 11/24/2021.     Lab Results  Component Value Date   WBC 6.4 09/10/2021   HGB 14.7 09/10/2021   HCT 43.5 09/10/2021   PLT 161 09/10/2021   GLUCOSE 157 (H) 11/21/2021   CHOL 155 11/21/2021   TRIG 98.0 11/21/2021   HDL 54.70 11/21/2021   LDLCALC 80 11/21/2021   ALT 21 11/21/2021   AST 22 11/21/2021   NA 140 11/21/2021   K 4.1 11/21/2021   CL 106 11/21/2021   CREATININE 0.58 11/21/2021   BUN 19 11/21/2021   CO2 27 11/21/2021   TSH 1.145 09/10/2021   HGBA1C 7.0 (H) 11/21/2021   MICROALBUR <0.7 04/24/2021    US Venous Img Lower Bilateral  Result Date: 09/10/2021 CLINICAL DATA:  Elevated D-dimer. EXAM: BILATERAL LOWER EXTREMITY VENOUS DOPPLER ULTRASOUND TECHNIQUE: Gray-scale sonography with compression, as well as color and duplex ultrasound, were performed to evaluate the deep venous system(s) from the level of the common femoral vein through the popliteal and proximal calf veins. COMPARISON:  None. FINDINGS: VENOUS Normal compressibility of the common femoral, superficial femoral, and popliteal veins, as well as the visualized calf veins. Visualized portions of profunda femoral vein and great saphenous vein unremarkable. No filling defects to suggest DVT on grayscale or color Doppler imaging. Doppler waveforms show normal direction of venous flow, normal respiratory plasticity and response to augmentation. Limited views of the contralateral common femoral vein are unremarkable. OTHER None. Limitations: none IMPRESSION: No evidence of a deep venous thrombosis of either lower extremity. Electronically Signed   By: Lajean Manes M.D.   On: 09/10/2021 15:44  DG Chest Portable 1 View  Result Date: 09/10/2021 CLINICAL DATA:  [place]ala[place].  Weakness for 2 hours. EXAM: PORTABLE CHEST 1 VIEW COMPARISON:  06/23/2019 FINDINGS: The heart size and mediastinal contours are  within normal limits. Both lungs are clear. The visualized skeletal structures are unremarkable. IMPRESSION: No active disease. Electronically Signed   By: Kerby Moors M.D.   On: 09/10/2021 13:53       Assessment & Plan:   Problem List Items Addressed This Visit     Health care maintenance    Physical today 11/24/21.  Mammogram 12/14/20- Birads I.  Scheduled for f/u mammogram 12/20/21. Colonoscopy 12/02/19 - moderate diverticulosis otherwise normal.  Bone density 12/03/19 - normal.       History of colonic polyps    Colonoscopy 11/2019 - diverticulosis.  Recommended f/u colonoscopy in 5 years.       History of CVA (cerebrovascular accident)    Continue daily aspirin.       Hypercholesteremia    Continue crestor.  Low cholesterol diet and exercise.  Follow lipid panel and liver function tests.       Relevant Orders   Lipid Profile   Hepatic function panel   Thrombocytopenia (HCC)    Follow cbc.       Type 2 diabetes mellitus with vascular disease ([place])    Bowels better off metformin.  Previous a1c elevated.  Did not tolerate jardiance or rybelsus.  Has adjusted her diet.  Monitoring carb intake.  Stays active.  a1c improved on recent check - 7.0. Will remain off medication.  Continue low carb diet and exercise.  Follow met b and a1c.  Eye exam 11/23/21 - ok.       Relevant Orders   HgB R8Q   Basic Metabolic Panel (BMET)   Other Visit Diagnoses     Routine general medical examination at a health care facility    -  Primary        Einar Pheasant, MD

## 2021-11-25 ENCOUNTER — Encounter: Payer: Self-pay | Admitting: Internal Medicine

## 2021-11-25 NOTE — Assessment & Plan Note (Addendum)
Continue daily aspirin 

## 2021-11-25 NOTE — Assessment & Plan Note (Signed)
Continue crestor.  Low cholesterol diet and exercise. Follow lipid panel and liver function tests.   

## 2021-11-25 NOTE — Assessment & Plan Note (Signed)
Follow cbc.  

## 2021-11-25 NOTE — Assessment & Plan Note (Signed)
Physical today 11/24/21.  Mammogram 12/14/20- Birads I.  Scheduled for f/u mammogram 12/20/21. Colonoscopy 12/02/19 - moderate diverticulosis otherwise normal.  Bone density 12/03/19 - normal.

## 2021-11-25 NOTE — Assessment & Plan Note (Addendum)
Bowels better off metformin.  Previous a1c elevated.  Did not tolerate jardiance or rybelsus.  Has adjusted her diet.  Monitoring carb intake.  Stays active.  a1c improved on recent check - 7.0. Will remain off medication.  Continue low carb diet and exercise.  Follow met b and a1c.  Eye exam 11/23/21 - ok.

## 2021-11-25 NOTE — Assessment & Plan Note (Signed)
Colonoscopy 11/2019 - diverticulosis.  Recommended f/u colonoscopy in 5 years.  

## 2021-11-27 ENCOUNTER — Other Ambulatory Visit: Payer: Self-pay

## 2021-11-27 ENCOUNTER — Telehealth: Payer: Self-pay | Admitting: Internal Medicine

## 2021-11-27 MED ORDER — ONETOUCH VERIO VI STRP: ORAL_STRIP | 12 refills | Status: DC

## 2021-11-27 NOTE — Telephone Encounter (Signed)
sent 

## 2021-11-27 NOTE — Telephone Encounter (Signed)
Pt called wanting to know if the provider will increase her test strips because she is using them more frequently.

## 2021-12-06 ENCOUNTER — Telehealth: Payer: Self-pay | Admitting: Internal Medicine

## 2021-12-06 NOTE — Telephone Encounter (Signed)
S/w pt -  advised I received pt readings, sent them to Dr Nicki Reaper. Dr Nicki Reaper out of office - will call pt when Dr returns and reviews

## 2021-12-06 NOTE — Telephone Encounter (Signed)
Pt called in with her sugar readings  July 1- 147 at 6:37 am, 162 at 8:20 am, 112 at 10:32 pm  July 2 122 at 6:29 am, 116 at 4:10 pm, 136 at 9:43 pm  July 3 -153 at 6:50 am, 118 at 6;27 pm, 156 at 10:09 pm   July 4-152 at 6:40 am, 145 at 10:35 am, 124 at 10:14 pm  July 5, 131 at 5:48 am, 114 at 10:18 am, 183 at 9:42 pm (only been an hour since she ate something)  7-6 111 at 6:44 am, 145 at 10:31 am, 178 at 10:06 pm  7-7 144 at 6:35 am, 133 at 12:06 pm, 156 at 9:04 pm  7-8 144 at 6:25 am, 158 at 4:50 pm, 192 at 7:45 pm  7-9 134 at 6:06 am, 171 at 4:07 pm   7-10 150 at 6:27 am, 108 at 3:15 pm 144 at 9:03 pm  711 161 at 6:53 am, 131 at 4:21 pm, 120 at 8:11 pm  712-134 at 6:35 am 155 at 10:07 am

## 2021-12-06 NOTE — Telephone Encounter (Signed)
Patient returning a call from [place].

## 2021-12-06 NOTE — Telephone Encounter (Signed)
Lm for pt to cb to let her know I got her readings, will send to Dr Nicki Reaper but she is out of the office until Tuesday.

## 2021-12-07 NOTE — Telephone Encounter (Signed)
Please notify - sugars overall look good.  Continue to monitor.

## 2021-12-08 NOTE — Telephone Encounter (Signed)
Pt advised.

## 2021-12-20 ENCOUNTER — Ambulatory Visit
Admission: RE | Admit: 2021-12-20 | Discharge: 2021-12-20 | Disposition: A | Discharge status: Home / Self Care | Payer: PPO | Source: Ambulatory Visit | Attending: Internal Medicine | Admitting: Internal Medicine

## 2021-12-20 DIAGNOSIS — Z1231 Encounter for screening mammogram for malignant neoplasm of breast: Secondary | ICD-10-CM | POA: Insufficient documentation

## 2021-12-22 NOTE — Telephone Encounter (Signed)
S/w pt - has been travelling with family - stated had been eating some out of the ordinary, not too bad but not normally her routine ( ice cream, bread, hush puppies, corn, etc ) Pt advised to watch diet, and continue monitoring. Pt stated now that she is back home and in her normal routine she is sure her numbers will look better the next time she sends them in.

## 2021-12-22 NOTE — Telephone Encounter (Signed)
Sugar Readings  7/13-118 @ 6:18 am, 141 @ 10:05 am, 150 @ 9:05 pm  7/14-143 @ 6:26 am, 173 @ 9:22 am, 206 @ 10:33 pm  7/15-138 @ 6:07 am, 173 @ 6:27 pm, 144 @ 10:01 pm  7/16-163 @ 6:57 am, 245 @ 11:09 pm  7/17-162 @ 7:38 am, 122 @ 3:11 pm  7/18-163 at 6:59 am, 138 at 4:31 pm,  7/19 160 @ 7:05 am, 158 @ 9:53 pm  7/20-152 @ 7:15 am, 195 @ 10:31 am,165 9:21 pm  7/21-158 @ 6:18 am, 199 @ 9:20 pm  7/22 156 @ 6:49 am 182 '@10'$ :15 am, 167 @ 8:34 pm  7/23 146 @ 6:22 am, 152 @ 3:37 pm, 173 @ 8:36 pm  7/24-140 @ 6:47 am, 197 @ 10:36 am, 123 at 5:33 pm, 220 at 9:22 pm  7/25-157 at 6:38 am, 199 @ 4:24 pm, 162 @ 9:11 pm  7/26-156 at 6:18 am, 110 at 12:07 pm, 209 at 10:30 pm  7/27-131 at 6:10 am  7/28-134 at 6:46 am

## 2021-12-22 NOTE — Telephone Encounter (Signed)
Reviewed sugar readings.  Appears to be increasing some.  Continue low carb diet and exercise.  Continue to follow.

## 2022-01-11 ENCOUNTER — Encounter: Payer: Self-pay | Admitting: Podiatry

## 2022-01-11 ENCOUNTER — Ambulatory Visit: Payer: PPO | Admitting: Podiatry

## 2022-01-11 DIAGNOSIS — B351 Tinea unguium: Secondary | ICD-10-CM

## 2022-01-11 DIAGNOSIS — L84 Corns and callosities: Secondary | ICD-10-CM

## 2022-01-11 DIAGNOSIS — E119 Type 2 diabetes mellitus without complications: Secondary | ICD-10-CM | POA: Diagnosis not present

## 2022-01-11 DIAGNOSIS — M79674 Pain in right toe(s): Secondary | ICD-10-CM | POA: Diagnosis not present

## 2022-01-11 DIAGNOSIS — D696 Thrombocytopenia, unspecified: Secondary | ICD-10-CM

## 2022-01-11 NOTE — Progress Notes (Signed)
This patient returns to my office for at risk foot care.  This patient requires this care by a professional since this patient will be at risk due to having diabetes and throbocytopenia..    This patient is unable to cut nails herself since the patient cannot reach her nails.These nails are painful walking and wearing shoes.  This patient presents for at risk foot care today.  General Appearance  Alert, conversant and in no acute stress.  Vascular  Dorsalis pedis and posterior tibial  pulses are palpable  bilaterally.  Capillary return is within normal limits  bilaterally. Temperature is within normal limits  bilaterally.  Neurologic  Senn-Weinstein monofilament wire test within normal limits  bilaterally. Muscle power within normal limits bilaterally.  Nails Thick disfigured discolored nails with subungual debris  from hallux to fifth toes bilaterally. No evidence of bacterial infection or drainage bilaterally.  Orthopedic  No limitations of motion  feet .  No crepitus or effusions noted.  No bony pathology or digital deformities noted. Plantar flexed 2,3 right foot.  Skin  normotropic skin  noted bilaterally.  No signs of infections or ulcers noted.   Asymptomatic porokeratosis sub 2 right foot.  Onychomycosis  Pain in right toes  Pain in left toes  Consent was obtained for treatment procedures.   Mechanical debridement of nails 1-5  bilaterally performed with a nail nipper.  Filed with dremel without incident.    Return office visit  4 months                   Told patient to return for periodic foot care and evaluation due to potential at risk complications.   Mica Releford DPM  

## 2022-01-15 ENCOUNTER — Telehealth: Payer: Self-pay | Admitting: Internal Medicine

## 2022-01-15 NOTE — Telephone Encounter (Signed)
Sugar readings  7/29-137 at 6:21 am, 129 at 12 pm, 2:42 at 10 pm 7/30-128 at 6:19 am, 157 at 4 pm, 94 at 10:50 pm 7/31-131 at 6:17 am, 143 at 10:34 pm 8/1-138 at 6:11 am, 148 at 9 pm 8/2-142 at 6:21 am, 153 at 1:48 pm, 143 at 9:10 pm 8/3-134 at 6:22 am, 180 at 10:20 am, 105 at 4:31 pm  8/4-144 at 6:13 am, 183 at 2:30 pm , 206 at 9:10 pm 8/5-142 at 6:30 am, 150 at 3:45 pm, 171 at 9:31 pm 8/6-150 at 6:45 am, 126 at 4:06 pm  8/7-153 at 6 am, 122 at 8 pm 8/8-153 at 6:30 am, 172 at 2:40 pm, 146 at 10:10 pm 8/9-154 at 6:50 am, 152 at 10:46 am, 106 at 5:32 pm, 206 at 9:46 pm 8/10-148 at 6:39 am 8/11-132 at 6:36 am, 192 at 11:17 pm 8/12-140 at 6:50 am, 163 at 10:33 am, 194 at 9:40 pm 8/13-163 at 6:07 am, 134 at 10:08 pm 814-194 at 6:38 am, 168 at 8:15 pm 8/15-148 at 6:35 am  8/16-165 at 6:19 am, 143 at 11:09 pm 817-159 at 6:26 am, 99 at 3:52 pm, 162 at 9:29 pm 8/18-158 at 6:32 am, 165 at 9:56 am, 122 at 8:45 pm 8/19-159 at 6:31 am, 145 at 10:36 pm 8/20-146 at 6:11 am, 150 at 12:38 pm, 179 at 10:07 pm 821-137 at 6:40 am, 229 at 9:38 am

## 2022-01-16 ENCOUNTER — Other Ambulatory Visit: Payer: Self-pay

## 2022-01-16 MED ORDER — ONETOUCH VERIO VI STRP: ORAL_STRIP | 12 refills | Status: DC

## 2022-01-16 NOTE — Telephone Encounter (Signed)
Thank her for the update regarding her blood sugars.  Some variation, but overall appear to be under reasonable control.  Continue low carb diet and exercise.  Will follow.

## 2022-01-16 NOTE — Telephone Encounter (Signed)
Pt called and notified that blood sugars were okay. I also sent in patient's glucose testing strips for 3 a day so that she can get enough to last 3 months.

## 2022-02-06 ENCOUNTER — Other Ambulatory Visit: Payer: Self-pay

## 2022-02-06 DIAGNOSIS — E1159 Type 2 diabetes mellitus with other circulatory complications: Secondary | ICD-10-CM

## 2022-02-06 MED ORDER — ONETOUCH VERIO VI STRP: ORAL_STRIP | 12 refills | Status: DC

## 2022-02-14 ENCOUNTER — Telehealth: Payer: Self-pay

## 2022-02-14 NOTE — Telephone Encounter (Signed)
Patient states she is giving [place] her sugar numbers for Dr. Einar Pheasant:  01/16/22 -  6:21am - 141   5:30pm - 118 01/17/22 -  6:31am - 133   10:31am - 135 01/18/22 -  6:43am - 141   4:06pm - 91   9:43pm - 141 01/19/22-   6:33am - 150   10:12am - 147 01/20/22 -  6:37am - 151   10:10am - 182   9:12pm - 154 01/21/22 -  6:14am - 167 01/22/22 -  6:44am - 177   10:27am - 169   9:54pm - 175 01/23/22 -  4:10am - 132 (leaving on trip)   10:13pm - 130 01/24/22 -  6:29am - 138   5:30pm - 168   10:05pm - 203 01/25/22 -  6:58am - 154   9:31pm - 185 01/26/22 -  6:35am - 157   12:30pm - 128 (before eating)  10:18pm - 159 01/27/22 -  6:45am - 175   9:40pm - 148 01/28/22 -  6:37am - 144   12:32pm - 161 01/29/22 -  6:37am - 155   12:05pm - 152 (before eating) 01/30/22 - 7:42am - 174   4:18pm - 145 01/31/22 -  7:37am - 166 02/01/22 -  6:57am - 180   3:27pm - 156 02/02/22 -  6:06am - 191   10:15pm - 146 02/03/22 -  7:12am - 151   12:11pm - 193   9:53pm - 159 02/04/22 - 6:12am - 181   9:50pm - 168 02/05/22 - 6:44am - 175   10:00am - 189   9:21pm - 150 02/06/22 - 6:48am - 152   10:00am- 220   12:40pm - 136   9:15pm - 143 02/07/22 -  7:00am - 148   9:32am - 171   6:30pm - 150 02/08/22 - 6:11am - 171   3:54pm - 102 02/09/22 -  6:18am - 129   9:54am - 162   5:15pm - 123 02/10/22 -  6:47am - 141   10:34am - 168 02/11/22 -  6:20am - 150 02/12/22 -  6:42am - 163   10:30am - 179 02/13/22 -  6:52am - 172   11:32am - 142   4:00pm - 175   9:57pm - 129 02/14/22 -  6:45am - 154   10:22am - 119

## 2022-02-15 NOTE — Telephone Encounter (Signed)
Spoke to patient and she verbalized understanding and stated that she will continue to eat low carb diet and continue exercising

## 2022-02-15 NOTE — Telephone Encounter (Signed)
LVM to call back.

## 2022-02-15 NOTE — Telephone Encounter (Signed)
Notify - I reviewed her sugars.  Some variation, but overall appear to be ok.  Continue low carb diet and exercise.  We will follow

## 2022-02-22 ENCOUNTER — Other Ambulatory Visit: Payer: Self-pay | Admitting: Internal Medicine

## 2022-02-22 DIAGNOSIS — E1159 Type 2 diabetes mellitus with other circulatory complications: Secondary | ICD-10-CM

## 2022-02-26 ENCOUNTER — Telehealth: Payer: Self-pay | Admitting: Internal Medicine

## 2022-02-26 NOTE — Telephone Encounter (Signed)
Patient called in her sugar levels.   9/21 @ 6:32am - 157, 9:34pm - 166  9/22 @ 9:23RA-076, 9:56am -160, 2:45pm-198  9/23 @ 6:57am- 147, 10:00am -226, 8:15pm - 155  9/24 @ 6:14 am -158, 10:05 p, 159  9/25 @ 6:49 am 166, 10:18 am -183, 10:31 pm- 156  9/26 @ 6:57 am-171, 9:11 pm 131  9/27 @ 6:48 am -165, 10:04 pm -111  9/28 @ 6:52am- 134, 10 pm -202 (1 hour after eating chicken nuggets)  9/29 2 6:48- 165,   9/30 @ 6:44 am - 143, 11:45 am 154, 4:22 pm - 122  10/1 @ 6:30 am 146, 4:11 pm  141, 11:16 pm - 160  10/2 @ 7:02am- 145, 10:34 am 141

## 2022-02-27 NOTE — Telephone Encounter (Signed)
Notify - sugars overall appear to be relatively stable.  Continue to follow.  Continue diet and exercise as she is doing.

## 2022-02-27 NOTE — Telephone Encounter (Signed)
Spoke to Patient regarding her sugar readings. Patient verbalized understanding and is agreeable.

## 2022-03-15 ENCOUNTER — Telehealth: Payer: Self-pay | Admitting: Internal Medicine

## 2022-03-15 NOTE — Telephone Encounter (Signed)
Pt called in with her sugar readings  10/3-6:30 am-152, 10:23 am-140, 10 pm-110  10/4 6:45 am-119, 4:39 pm -10, 10:13 pm-159   10/5 6:31 am-142, 6:03 pm-100, 11:12 am-187  10/6 6:15 am-120, 4:15 pm-105, 9:06 pm-153  10/7 6:54 am-143, 9:11 pm-115  10/8 6:28 am-132, 4:34 pm-142, 11:10 pm-125  10/9 6:29 am-131, 4 pm-188,   10/10 8:35 am-151, 4 pm 120, 9:37 pm-197  10/11 7:20 am-134  10/12 7:44 am-146, 8:48 pm-183  10/13 7:09 am-130, 2:56 pm-158  10/14 6:38 am-151, 9:04 pm-93  10/15 6:16 am-138, 4:39 pm-147, 10:07 pm-152  10/16 7:01 am-144, 10:23 pm-155  10/17 6:26 am-152, 6:11 pm-114, 10:05 pm-114  10/18 6:46 am-138, 4:31 pm-96,10:10 pm-133  10/19  9:33 am-168

## 2022-03-16 NOTE — Telephone Encounter (Signed)
Pt called and advised on below. She said that she hasn't had any lows but she does begin to feel that she needs to eat something sometimes befor her regular meals. When she has checked readings are usually in the 90's at this time. That's the lowest she is really seeing.

## 2022-03-16 NOTE — Telephone Encounter (Signed)
Reviewed sugar readings.  Blood sugars appear to be doing ok.  Confirm with her - no low sugars.

## 2022-03-16 NOTE — Telephone Encounter (Signed)
Noted.  Just educate her on eating regular meals and not going long periods without eating.  Let us know if any problems.

## 2022-03-19 ENCOUNTER — Encounter: Payer: Self-pay | Admitting: *Deleted

## 2022-03-26 ENCOUNTER — Other Ambulatory Visit (INDEPENDENT_AMBULATORY_CARE_PROVIDER_SITE_OTHER): Payer: PPO

## 2022-03-26 DIAGNOSIS — E1159 Type 2 diabetes mellitus with other circulatory complications: Secondary | ICD-10-CM

## 2022-03-26 DIAGNOSIS — E7800 Pure hypercholesterolemia, unspecified: Secondary | ICD-10-CM | POA: Diagnosis not present

## 2022-03-26 LAB — BASIC METABOLIC PANEL
BUN: 15 mg/dL (ref 6–23)
CO2: 28 mEq/L (ref 19–32)
Calcium: 9.4 mg/dL (ref 8.4–10.5)
Chloride: 107 mEq/L (ref 96–112)
Creatinine, Ser: 0.52 mg/dL (ref 0.40–1.20)
GFR: 92.86 mL/min (ref >60.00)
Glucose, Bld: 149 mg/dL — ABNORMAL HIGH (ref 70–99)
Potassium: 4 mEq/L (ref 3.5–5.1)
Sodium: 142 mEq/L (ref 135–145)

## 2022-03-26 LAB — HEPATIC FUNCTION PANEL
ALT: 19 U/L (ref 0–35)
AST: 21 U/L (ref 0–37)
Albumin: 4.3 g/dL (ref 3.5–5.2)
Alkaline Phosphatase: 53 U/L (ref 39–117)
Bilirubin, Direct: 0.1 mg/dL (ref 0.0–0.3)
Total Bilirubin: 0.7 mg/dL (ref 0.2–1.2)
Total Protein: 6.2 g/dL (ref 6.0–8.3)

## 2022-03-26 LAB — LIPID PANEL
Cholesterol: 177 mg/dL (ref 0–200)
HDL: 60.5 mg/dL (ref >39.00)
LDL Cholesterol: 82 mg/dL (ref 0–99)
NonHDL: 116.82
Total CHOL/HDL Ratio: 3
Triglycerides: 175 mg/dL — ABNORMAL HIGH (ref 0.0–149.0)
VLDL: 35 mg/dL (ref 0.0–40.0)

## 2022-03-26 LAB — HEMOGLOBIN A1C: Hgb A1c MFr Bld: 7.2 % — ABNORMAL HIGH (ref 4.6–6.5)

## 2022-03-28 ENCOUNTER — Encounter: Payer: Self-pay | Admitting: Internal Medicine

## 2022-03-28 ENCOUNTER — Ambulatory Visit (INDEPENDENT_AMBULATORY_CARE_PROVIDER_SITE_OTHER): Payer: PPO | Admitting: Internal Medicine

## 2022-03-28 VITALS — BP 122/74 | HR 72 | Temp 98.0°F | Resp 17 | Ht 65.0 in | Wt 170.6 lb

## 2022-03-28 DIAGNOSIS — D696 Thrombocytopenia, unspecified: Secondary | ICD-10-CM

## 2022-03-28 DIAGNOSIS — Z23 Encounter for immunization: Secondary | ICD-10-CM | POA: Diagnosis not present

## 2022-03-28 DIAGNOSIS — E1159 Type 2 diabetes mellitus with other circulatory complications: Secondary | ICD-10-CM | POA: Diagnosis not present

## 2022-03-28 DIAGNOSIS — E7800 Pure hypercholesterolemia, unspecified: Secondary | ICD-10-CM | POA: Diagnosis not present

## 2022-03-28 DIAGNOSIS — M5441 Lumbago with sciatica, right side: Secondary | ICD-10-CM

## 2022-03-28 DIAGNOSIS — F439 Reaction to severe stress, unspecified: Secondary | ICD-10-CM | POA: Diagnosis not present

## 2022-03-28 DIAGNOSIS — Z86010 Personal history of colonic polyps: Secondary | ICD-10-CM

## 2022-03-28 MED ORDER — ROSUVASTATIN CALCIUM 5 MG PO TABS: 5.0000 mg | ORAL_TABLET | Freq: Every day | ORAL | 1 refills | Status: DC

## 2022-03-28 NOTE — Progress Notes (Signed)
[name], female   DOB: 07-05-1950, 71 y.o.   MRN: [id number]   Subjective:    [name], female    DOB: 12/18/1950, 71 y.o.   MRN: [id number]   Patient here for  Chief Complaint  Patient presents with   Follow-up   Diabetes   .   HPI Here to follow up regarding her diabetes, cholesterol and blood pressure. Bowels better off metformin.  Did not tolerate jardiance or rybelsus.  On no medication now.  Trying to watch her diet.  Low carb diet and exercise.  No chest pain or sob reported.  No abdominal pain reported.  Does have issues with pain - right buttock into calf.  Worse - first up in am.  Taking ibuprofen /tylenol.  Seeing Dr Emmit Pomfret - TENS.   Discussed labs.    Past Medical History:  Diagnosis Date   Colon polyps    hyperplastic   Diabetes mellitus without complication ([place])    Diverticulosis    Hx of adenomatous colonic polyps    tubular adenoma   Hypercholesterolemia    Serrated adenoma of colon    Stroke (cerebrum) ([place])    Thrombocytopenia ([place])    Uterus descensus    Past Surgical History:  Procedure Laterality Date   ABDOMINAL HYSTERECTOMY  2009   CHOLECYSTECTOMY  2007   COLONOSCOPY N/A 04/05/2015   Procedure: COLONOSCOPY;  Surgeon: Lollie Sails, MD;  Location: [facility] ENDOSCOPY;  Service: Endoscopy;  Laterality: N/A;   COLONOSCOPY WITH PROPOFOL N/A 10/09/2016   Procedure: COLONOSCOPY WITH PROPOFOL;  Surgeon: Lollie Sails, MD;  Location: [facility] ENDOSCOPY;  Service: Endoscopy;  Laterality: N/A;   COLONOSCOPY WITH PROPOFOL N/A 12/02/2019   Procedure: COLONOSCOPY WITH PROPOFOL;  Surgeon: Toledo, Benay Pike, MD;  Location: [facility] ENDOSCOPY;  Service: Gastroenterology;  Laterality: N/A;   DILATION AND CURETTAGE OF UTERUS  2001 and 2006   FOOT SURGERY  2007   TONSILLECTOMY  1960   TUBAL LIGATION     vocal cord cyst removed     Family History  Problem Relation Age of Onset   Colon cancer Mother    Diabetes Mother     Kidney disease Mother    Heart disease Mother    Prostate cancer Father    Diabetes Father    Glaucoma Father    Uterine cancer Maternal Aunt    Breast cancer Paternal Aunt        x2   Stroke Paternal Grandmother    Stroke Paternal Grandfather    Diabetes Brother    Diabetes Brother    Diabetes Other    Social History   Socioeconomic History   Marital status: Married    Spouse name: Not on file   Number of children: Not on file   Years of education: Not on file   Highest education level: Not on file  Occupational History   Not on file  Tobacco Use   Smoking status: Never   Smokeless tobacco: Never  Vaping Use   Vaping Use: Never used  Substance and Sexual Activity   Alcohol use: No    Alcohol/week: 0.0 standard drinks of alcohol   Drug use: No   Sexual activity: Not Currently  Other Topics Concern   Not on file  Social History Narrative   Not on file   Social Determinants of Health   Financial Resource Strain: Low Risk  (04/26/2021)   Overall Financial Resource Strain (CARDIA)    Difficulty  of Paying Living Expenses: Not hard at all  Food Insecurity: No Food Insecurity (04/26/2021)   Hunger Vital Sign    Worried About Running Out of Food in the Last Year: Never true    Ran Out of Food in the Last Year: Never true  Transportation Needs: No Transportation Needs (04/26/2021)   PRAPARE - Hydrologist (Medical): No    Lack of Transportation (Non-Medical): No  Physical Activity: Insufficiently Active (04/26/2021)   Exercise Vital Sign    Days of Exercise per Week: 3 days    Minutes of Exercise per Session: 20 min  Stress: No Stress Concern Present (04/26/2021)   [place]    Feeling of Stress : Not at all  Social Connections: Unknown (04/26/2021)   Social Connection and Isolation Panel [NHANES]    Frequency of Communication with Friends and Family: Not on file     Frequency of Social Gatherings with Friends and Family: Not on file    Attends Religious Services: Not on file    Active Member of Clubs or Organizations: Not on file    Attends Archivist Meetings: Not on file    Marital Status: Married     Review of Systems  Constitutional:  Negative for appetite change and unexpected weight change.  HENT:  Negative for congestion and sinus pressure.   Respiratory:  Negative for cough, chest tightness and shortness of breath.   Cardiovascular:  Negative for chest pain, palpitations and leg swelling.  Gastrointestinal:  Negative for abdominal pain, diarrhea, nausea and vomiting.  Genitourinary:  Negative for difficulty urinating and dysuria.  Musculoskeletal:  Negative for myalgias.       Right buttock/leg pain as outlined.   Skin:  Negative for color change and rash.  Neurological:  Negative for dizziness and headaches.  Psychiatric/Behavioral:  Negative for agitation and dysphoric mood.        Objective:     BP 122/74 (BP Location: Left Arm, Patient Position: Sitting, Cuff Size: Small)   Pulse 72   Temp 98 F (36.7 C) (Temporal)   Resp 17   Ht _0  (1.651 m)   Wt 170 lb 9.6 oz (77.4 kg)   SpO2 97%   BMI 28.39 kg/m  Wt Readings from Last 3 Encounters:  03/28/22 170 lb 9.6 oz (77.4 kg)  11/24/21 171 lb (77.6 kg)  09/12/21 169 lb (76.7 kg)    Physical Exam Vitals reviewed.  Constitutional:      General: She is not in acute distress.    Appearance: Normal appearance.  HENT:     Head: Normocephalic and atraumatic.     Right Ear: External ear normal.     Left Ear: External ear normal.  Eyes:     General: No scleral icterus.       Right eye: No discharge.        Left eye: No discharge.     Conjunctiva/sclera: Conjunctivae normal.  Neck:     Thyroid: No thyromegaly.  Cardiovascular:     Rate and Rhythm: Normal rate and regular rhythm.  Pulmonary:     [place]: No respiratory distress.     Breath sounds: Normal breath  sounds. No wheezing.  Abdominal:     General: Bowel sounds are normal.     Palpations: Abdomen is soft.     Tenderness: There is no abdominal tenderness.  Musculoskeletal:        General: No  swelling or tenderness.     Cervical back: Neck supple. No tenderness.  Lymphadenopathy:     Cervical: No cervical adenopathy.  Skin:    Findings: No erythema or rash.  Neurological:     Mental Status: She is alert.  Psychiatric:        Mood and Affect: Mood normal.        Behavior: Behavior normal.      Outpatient Encounter Medications as of 03/28/2022  Medication Sig   aspirin 81 MG tablet Take 81 mg by mouth daily.   Cholecalciferol 25 MCG (1000 UT) tablet Take 1,000 Units by mouth daily.    dorzolamide-timolol (COSOPT) 22.3-6.8 MG/ML ophthalmic solution Place 1 drop into both eyes 2 (two) times daily.   glucose blood (ONETOUCH VERIO) test strip USE AS INSTRUCTED TO CHECK BLOOD SUGARS DAILY. DX E11.9   latanoprost (XALATAN) 0.005 % ophthalmic solution 1 drop at bedtime.   Multiple Vitamins-Minerals (MULTIVITAMIN ADULT PO) Take by mouth daily.   Probiotic Product (PROBIOTIC DAILY) CAPS Take 1 capsule by mouth daily.   [DISCONTINUED] rosuvastatin (CRESTOR) 5 MG tablet TAKE 1 TABLET (5 MG TOTAL) BY MOUTH DAILY.   rosuvastatin (CRESTOR) 5 MG tablet Take 1 tablet (5 mg total) by mouth daily.   [DISCONTINUED] triamcinolone ointment (KENALOG) 0.5 % Apply 1 application topically 2 (two) times daily. For itching.   No facility-administered encounter medications on file as of 03/28/2022.     Lab Results  Component Value Date   WBC 6.4 09/10/2021   HGB 14.7 09/10/2021   HCT 43.5 09/10/2021   PLT 161 09/10/2021   GLUCOSE 149 (H) 03/26/2022   CHOL 177 03/26/2022   TRIG 175.0 (H) 03/26/2022   HDL 60.50 03/26/2022   LDLCALC 82 03/26/2022   ALT 19 03/26/2022   AST 21 03/26/2022   NA 142 03/26/2022   K 4.0 03/26/2022   CL 107 03/26/2022   CREATININE 0.52 03/26/2022   BUN 15 03/26/2022   CO2  28 03/26/2022   TSH 1.145 09/10/2021   HGBA1C 7.2 (H) 03/26/2022   MICROALBUR <0.7 04/24/2021    MM 3D SCREEN BREAST BILATERAL  Result Date: 12/21/2021 CLINICAL DATA:  Screening. EXAM: DIGITAL SCREENING BILATERAL MAMMOGRAM WITH TOMOSYNTHESIS AND CAD TECHNIQUE: Bilateral screening digital craniocaudal and mediolateral oblique mammograms were obtained. Bilateral screening digital breast tomosynthesis was performed. The images were evaluated with computer-aided detection. COMPARISON:  Previous exam(s). ACR Breast Density Category b: There are scattered areas of fibroglandular density. FINDINGS: There are no findings suspicious for malignancy. IMPRESSION: No mammographic evidence of malignancy. A result letter of this screening mammogram will be mailed directly to the patient. RECOMMENDATION: Screening mammogram in one year. (Code:SM-B-01Y) BI-RADS CATEGORY  1: Negative. Electronically Signed   By: Lajean Manes M.D.   On: 12/21/2021 12:18       Assessment & Plan:   Problem List Items Addressed This Visit     History of colonic polyps    Colonoscopy 11/2019 - diverticulosis.  Recommended f/u colonoscopy in 5 years.       Hypercholesteremia    Continue crestor.  Low cholesterol diet and exercise.  Follow lipid panel and liver function tests.  Lab Results  Component Value Date   CHOL 177 03/26/2022   HDL 60.50 03/26/2022   LDLCALC 82 03/26/2022   TRIG 175.0 (H) 03/26/2022   CHOLHDL 3 03/26/2022         Relevant Medications   rosuvastatin (CRESTOR) 5 MG tablet   Other Relevant Orders   Lipid  Profile   Basic Metabolic Panel (BMET)   Hepatic function panel   Low back pain    Pain in right buttock - into calf.  Stretches.  Seeing Dr Emmit Pomfret.  Notify if continues or if desires further evaluation.       Stress    Overall handling stress relatively well.  Follow.       Thrombocytopenia ([place])    Follow cbc.       Type 2 diabetes mellitus with vascular disease ([place])    Bowels better  off metformin.  A1c elevated above goal.  Did not tolerate jardiance or rybelsus.  Has adjusted her diet.  Monitoring carb intake.  Stays active.  a1c improved on recent check - 7.2. Wants to remain off medication.  Continue low carb diet and exercise.  Follow met b and a1c.  Eye exam 11/23/21 - ok.       Relevant Medications   rosuvastatin (CRESTOR) 5 MG tablet   Other Relevant Orders   Urine Microalbumin w/creat. ratio   HgB A1c   Other Visit Diagnoses     Need for influenza vaccination    -  Primary   Relevant Orders   Flu Vaccine QUAD High Dose(Fluad) (Completed)        Einar Pheasant, MD

## 2022-04-08 DIAGNOSIS — M545 Low back pain, unspecified: Secondary | ICD-10-CM | POA: Insufficient documentation

## 2022-04-08 NOTE — Addendum Note (Signed)
Addended by: Alisa Graff on: 04/08/2022 10:06 AM   Modules accepted: Level of Service

## 2022-04-08 NOTE — Assessment & Plan Note (Signed)
Colonoscopy 11/2019 - diverticulosis.  Recommended f/u colonoscopy in 5 years.

## 2022-04-08 NOTE — Assessment & Plan Note (Signed)
Follow cbc.  

## 2022-04-08 NOTE — Assessment & Plan Note (Signed)
Overall handling stress relatively well.  Follow.

## 2022-04-08 NOTE — Assessment & Plan Note (Signed)
Bowels better off metformin.  A1c elevated above goal.  Did not tolerate jardiance or rybelsus.  Has adjusted her diet.  Monitoring carb intake.  Stays active.  a1c improved on recent check - 7.2. Wants to remain off medication.  Continue low carb diet and exercise.  Follow met b and a1c.  Eye exam 11/23/21 - ok.

## 2022-04-08 NOTE — Assessment & Plan Note (Signed)
Continue crestor.  Low cholesterol diet and exercise.  Follow lipid panel and liver function tests.  Lab Results  Component Value Date   CHOL 177 03/26/2022   HDL 60.50 03/26/2022   LDLCALC 82 03/26/2022   TRIG 175.0 (H) 03/26/2022   CHOLHDL 3 03/26/2022

## 2022-04-08 NOTE — Assessment & Plan Note (Addendum)
Pain in right buttock - into calf.  Stretches.  Seeing Dr Emmit Pomfret.  Notify if continues or if desires further evaluation.

## 2022-04-09 ENCOUNTER — Other Ambulatory Visit: Payer: Self-pay

## 2022-04-09 ENCOUNTER — Telehealth: Payer: Self-pay | Admitting: Internal Medicine

## 2022-04-09 MED ORDER — MELOXICAM 7.5 MG PO TABS: 7.5000 mg | ORAL_TABLET | Freq: Every day | ORAL | 0 refills | Status: DC

## 2022-04-09 NOTE — Telephone Encounter (Signed)
Pt advised hold on aleve w/ meloxicam. Try meloxicam on its own first.  Advised monitor BP and GI for any changes and let me know  Rx sent Meloxicam 7.'5mg'$  1 QD PRN #15 ref x 0

## 2022-04-09 NOTE — Telephone Encounter (Signed)
Pt would like to be called in regards to her sciatic nerve. Pt stated she would like the provider to prescribe some medication for inflammation. Pt took meloxicam and aleve for pain. Pt stated she had an allergic reaction when she took the [facility] brand of arthritis pain relief '650mg'$ 

## 2022-04-09 NOTE — Telephone Encounter (Signed)
S/w pt - having sciatic nerve pain. Has been going to chiropractor,and has been piggy backing tylenol and advil for a few weeks now with no relief.  Saturday pt took 2 [facility] brand acetaminophen for arthritis '650mg'$  - broke out in hives.  Took charcoal liquid and took allergy pill and rash cleared with no further allergic issues.  Pt took 1 of husbands old Meloxicam and 1 aleve last night, and stated felt much better. Slept with no pain as well.  Pt states she noticed meloxicam is over 27 years old when she looked at it this morning, and is asking for rx to be sent to local pharm.

## 2022-04-09 NOTE — Telephone Encounter (Signed)
Would hold on taking alleve with meloxicam.  I can send in meloxicam rx, but she will need to monitor blood pressure and monitor for any GI issues.

## 2022-04-13 ENCOUNTER — Telehealth: Payer: Self-pay | Admitting: Internal Medicine

## 2022-04-13 NOTE — Telephone Encounter (Signed)
Sugar Readings  11/2-6:52 am 135, 9:03 pm 153  11/3-6:28 am 141, 11:58 am 114, 9:35 pm 160  11/4-6:29 am 128, 10:33 am 143, 6:08 pm 164, 9:56 pm 139  11/5-5:49 am 151, 3:30 pm 192, 10:13 pm 202  11/6-6:28 am 153, 9:20 pm 119  11/7-7:34 am 137, 10:58 am 160  118 7:18 am 143  11/9-6:41 am 122, 12:52 pm 107, 8:57 pm 137  11/10-6:58 am 165, 6:14 pm 125  11/11-6:55 am 160   11/12-5:11 am 144, 4:31 pm 155  11/13-5:47 am 145, 12:09 pm 146  11/14-6:37 am 148, 10:21 am 145  11/15-7:01 am 150, 5:40 pm 151   11/16-6:41 am 153, 10:38 am 147, 9:54 pm 260  11/17-6:35 am 154

## 2022-04-13 NOTE — Telephone Encounter (Signed)
Sugars overall ok.  Occasional elevation, but overall ok.  Continue low carb diet and exercise.  We will follow.

## 2022-04-13 NOTE — Telephone Encounter (Signed)
Pt advised.

## 2022-04-24 ENCOUNTER — Telehealth: Payer: Self-pay | Admitting: Internal Medicine

## 2022-04-24 NOTE — Telephone Encounter (Signed)
Patient called and said today will be her last day taking meloxicam (MOBIC) 7.5 MG tablet. She still is having pain and doesn't know what else to do.

## 2022-04-24 NOTE — Telephone Encounter (Signed)
S/w [place] - still having bad backpain.  Has been using tylenol and meloxicam and using ice and TENS machine and otc muscle rub.  Does help some, but still has pain.  States backpain is more intense some days then others but is always there.   Has been seeing chiropractor, doesn't help.   Wondering if she can try Tramadol ? Was told about it on Thanksgiving.  Also wondering if she should see ortho?

## 2022-04-24 NOTE — Telephone Encounter (Signed)
If persistent symptoms, would recommend ortho evaluation and then can determine best treatment.  Need to know if has preference of which MD to see.  Also, if acute issues, [facility] has a walk in clinic.

## 2022-04-25 DIAGNOSIS — M5416 Radiculopathy, lumbar region: Secondary | ICD-10-CM | POA: Diagnosis not present

## 2022-04-25 NOTE — Telephone Encounter (Signed)
S/w pt - increased pain this AM Pt advised to go to [facility] walk in clinic. Pt agreed.

## 2022-05-02 ENCOUNTER — Ambulatory Visit (INDEPENDENT_AMBULATORY_CARE_PROVIDER_SITE_OTHER): Payer: PPO

## 2022-05-02 VITALS — Ht 65.0 in | Wt 170.0 lb

## 2022-05-02 DIAGNOSIS — Z0000 Encounter for general adult medical examination without abnormal findings: Secondary | ICD-10-CM | POA: Diagnosis not present

## 2022-05-02 NOTE — Patient Instructions (Addendum)
Victoria Harris , Thank you for taking time to come for your Medicare Wellness Visit. I appreciate your ongoing commitment to your health goals. Please review the following plan we discussed and let me know if I can assist you in the future.   These are the goals we discussed:  Goals       Patient Stated     Monitor Blood Pressure (pt-stated)      Healthy diet Blood sugars controlled        This is a list of the screening recommended for you and due dates:  Health Maintenance  Topic Date Due   DTaP/Tdap/Td vaccine (1 - Tdap) Never done   Yearly kidney health urinalysis for diabetes  04/24/2022   Eye exam for diabetics  05/11/2022   Complete foot exam   05/18/2022   Hemoglobin A1C  09/25/2022   Mammogram  12/21/2022   Yearly kidney function blood test for diabetes  03/27/2023   Medicare Annual Wellness Visit  05/03/2023   Colon Cancer Screening  12/01/2029   Pneumonia Vaccine  Completed   Flu Shot  Completed   DEXA scan (bone density measurement)  Completed   Hepatitis C Screening: USPSTF Recommendation to screen - Ages 12-79 yo.  Completed   Zoster (Shingles) Vaccine  Completed   HPV Vaccine  Aged Out   COVID-19 Vaccine  Discontinued    Advanced directives: End of life planning; Advance aging; Advanced directives discussed.  Copy of current HCPOA/Living Will requested.    Conditions/risks identified: none new  Next appointment: Follow up in one year for your annual wellness visit    Preventive Care 65 Years and Older, Female Preventive care refers to lifestyle choices and visits with your health care provider that can promote health and wellness. What does preventive care include? A yearly physical exam. This is also called an annual well check. Dental exams once or twice a year. Routine eye exams. Ask your health care provider how often you should have your eyes checked. Personal lifestyle choices, including: Daily care of your teeth and gums. Regular physical  activity. Eating a healthy diet. Avoiding tobacco and drug use. Limiting alcohol use. Practicing safe sex. Taking low-dose aspirin every day. Taking vitamin and mineral supplements as recommended by your health care provider. What happens during an annual well check? The services and screenings done by your health care provider during your annual well check will depend on your age, overall health, lifestyle risk factors, and family history of disease. Counseling  Your health care provider may ask you questions about your: Alcohol use. Tobacco use. Drug use. Emotional well-being. Home and relationship well-being. Sexual activity. Eating habits. History of falls. Memory and ability to understand (cognition). Work and work Statistician. Reproductive health. Screening  You may have the following tests or measurements: Height, weight, and BMI. Blood pressure. Lipid and cholesterol levels. These may be checked every 5 years, or more frequently if you are over 36 years old. Skin check. Lung cancer screening. You may have this screening every year starting at age 53 if you have a 30-pack-year history of smoking and currently smoke or have quit within the past 15 years. Fecal occult blood test (FOBT) of the stool. You may have this test every year starting at age 62. Flexible sigmoidoscopy or colonoscopy. You may have a sigmoidoscopy every 5 years or a colonoscopy every 10 years starting at age 66. Hepatitis C blood test. Hepatitis B blood test. Sexually transmitted disease (STD) testing. Diabetes screening. This  is done by checking your blood sugar (glucose) after you have not eaten for a while (fasting). You may have this done every 1-3 years. Bone density scan. This is done to screen for osteoporosis. You may have this done starting at age 37. Mammogram. This may be done every 1-2 years. Talk to your health care provider about how often you should have regular mammograms. Talk with your  health care provider about your test results, treatment options, and if necessary, the need for more tests. Vaccines  Your health care provider may recommend certain vaccines, such as: Influenza vaccine. This is recommended every year. Tetanus, diphtheria, and acellular pertussis (Tdap, Td) vaccine. You may need a Td booster every 10 years. Zoster vaccine. You may need this after age 30. Pneumococcal 13-valent conjugate (PCV13) vaccine. One dose is recommended after age 106. Pneumococcal polysaccharide (PPSV23) vaccine. One dose is recommended after age 56. Talk to your health care provider about which screenings and vaccines you need and how often you need them. This information is not intended to replace advice given to you by your health care provider. Make sure you discuss any questions you have with your health care provider. Document Released: 06/10/2015 Document Revised: 02/01/2016 Document Reviewed: 03/15/2015 Elsevier Interactive Patient Education  2017 [place] Prevention in the Home Falls can cause injuries. They can happen to people of all ages. There are many things you can do to make your home safe and to help prevent falls. What can I do on the outside of my home? Regularly fix the edges of walkways and driveways and fix any cracks. Remove anything that might make you trip as you walk through a door, such as a raised step or threshold. Trim any bushes or trees on the path to your home. Use bright outdoor lighting. Clear any walking paths of anything that might make someone trip, such as rocks or tools. Regularly check to see if handrails are loose or broken. Make sure that both sides of any steps have handrails. Any raised decks and porches should have guardrails on the edges. Have any leaves, snow, or ice cleared regularly. Use sand or salt on walking paths during winter. Clean up any spills in your garage right away. This includes oil or grease spills. What can I  do in the bathroom? Use night lights. Install grab bars by the toilet and in the tub and shower. Do not use towel bars as grab bars. Use non-skid mats or decals in the tub or shower. If you need to sit down in the shower, use a plastic, non-slip stool. Keep the floor dry. Clean up any water that spills on the floor as soon as it happens. Remove soap buildup in the tub or shower regularly. Attach bath mats securely with double-sided non-slip rug tape. Do not have throw rugs and other things on the floor that can make you trip. What can I do in the bedroom? Use night lights. Make sure that you have a light by your bed that is easy to reach. Do not use any sheets or blankets that are too big for your bed. They should not hang down onto the floor. Have a firm chair that has side arms. You can use this for support while you get dressed. Do not have throw rugs and other things on the floor that can make you trip. What can I do in the kitchen? Clean up any spills right away. Avoid walking on wet floors. Keep items that  you use a lot in easy-to-reach places. If you need to reach something above you, use a strong step stool that has a grab bar. Keep electrical cords out of the way. Do not use floor polish or wax that makes floors slippery. If you must use wax, use non-skid floor wax. Do not have throw rugs and other things on the floor that can make you trip. What can I do with my stairs? Do not leave any items on the stairs. Make sure that there are handrails on both sides of the stairs and use them. Fix handrails that are broken or loose. Make sure that handrails are as long as the stairways. Check any carpeting to make sure that it is firmly attached to the stairs. Fix any carpet that is loose or worn. Avoid having throw rugs at the top or bottom of the stairs. If you do have throw rugs, attach them to the floor with carpet tape. Make sure that you have a light switch at the top of the stairs  and the bottom of the stairs. If you do not have them, ask someone to add them for you. What else can I do to help prevent falls? Wear shoes that: Do not have high heels. Have rubber bottoms. Are comfortable and fit you well. Are closed at the toe. Do not wear sandals. If you use a stepladder: Make sure that it is fully opened. Do not climb a closed stepladder. Make sure that both sides of the stepladder are locked into place. Ask someone to hold it for you, if possible. Clearly mark and make sure that you can see: Any grab bars or handrails. First and last steps. Where the edge of each step is. Use tools that help you move around (mobility aids) if they are needed. These include: Canes. Walkers. Scooters. Crutches. Turn on the lights when you go into a dark area. Replace any light bulbs as soon as they burn out. Set up your furniture so you have a clear path. Avoid moving your furniture around. If any of your floors are uneven, fix them. If there are any pets around you, be aware of where they are. Review your medicines with your doctor. Some medicines can make you feel dizzy. This can increase your chance of falling. Ask your doctor what other things that you can do to help prevent falls. This information is not intended to replace advice given to you by your health care provider. Make sure you discuss any questions you have with your health care provider. Document Released: 03/10/2009 Document Revised: 10/20/2015 Document Reviewed: 06/18/2014 Elsevier Interactive Patient Education  2017 [place]. Opioid Pain Medicine Management Opioids are powerful medicines that are used to treat moderate to severe pain. When used for short periods of time, they can help you to: Sleep better. Do better in physical or occupational therapy. Feel better in the first few days after an injury. Recover from surgery. Opioids should be taken with the supervision of a trained health care provider.  They should be taken for the shortest period of time possible. This is because opioids can be addictive, and the longer you take opioids, the greater your risk of addiction. This addiction can also be called opioid use disorder. What are the risks? Using opioid pain medicines for longer than 3 days increases your risk of side effects. Side effects include: Constipation. Nausea and vomiting. Breathing difficulties (respiratory depression). Drowsiness. Confusion. Opioid use disorder. Itching. Taking opioid pain medicine for a long  period of time can affect your ability to do daily tasks. It also puts you at risk for: Motor vehicle crashes. Depression. Suicide. Heart attack. Overdose, which can be life-threatening. What is a pain treatment plan? A pain treatment plan is an agreement between you and your health care provider. Pain is unique to each person, and treatments vary depending on your condition. To manage your pain, you and your health care provider need to work together. To help you do this: Discuss the goals of your treatment, including how much pain you might expect to have and how you will manage the pain. Review the risks and benefits of taking opioid medicines. Remember that a good treatment plan uses more than one approach and minimizes the chance of side effects. Be honest about the amount of medicines you take and about any drug or alcohol use. Get pain medicine prescriptions from only one health care provider. Pain can be managed with many types of alternative treatments. Ask your health care provider to refer you to one or more specialists who can help you manage pain through: Physical or occupational therapy. Counseling (cognitive behavioral therapy). Good nutrition. Biofeedback. Massage. Meditation. Non-opioid medicine. Following a gentle exercise program. How to use opioid pain medicine Taking medicine Take your pain medicine exactly as told by your health care  provider. Take it only when you need it. If your pain gets less severe, you may take less than your prescribed dose if your health care provider approves. If you are not having pain, do nottake pain medicine unless your health care provider tells you to take it. If your pain is severe, do nottry to treat it yourself by taking more pills than instructed on your prescription. Contact your health care provider for help. Write down the times when you take your pain medicine. It is easy to become confused while on pain medicine. Writing the time can help you avoid overdose. Take other over-the-counter or prescription medicines only as told by your health care provider. Keeping yourself and others safe  While you are taking opioid pain medicine: Do not drive, use machinery, or power tools. Do not sign legal documents. Do not drink alcohol. Do not take sleeping pills. Do not supervise children by yourself. Do not do activities that require climbing or being in high places. Do not go to a lake, river, ocean, spa, or swimming pool. Do not share your pain medicine with anyone. Keep pain medicine in a locked cabinet or in a secure area where pets and children cannot reach it. Stopping your use of opioids If you have been taking opioid medicine for more than a few weeks, you may need to slowly decrease (taper) how much you take until you stop completely. Tapering your use of opioids can decrease your risk of symptoms of withdrawal, such as: Pain and cramping in the abdomen. Nausea. Sweating. Sleepiness. Restlessness. Uncontrollable shaking (tremors). Cravings for the medicine. Do not attempt to taper your use of opioids on your own. Talk with your health care provider about how to do this. Your health care provider may prescribe a step-down schedule based on how much medicine you are taking and how long you have been taking it. Getting rid of leftover pills Do not save any leftover pills. Get rid of  leftover pills safely by: Taking the medicine to a prescription take-back program. This is usually offered by the county or law enforcement. Bringing them to a pharmacy that has a drug disposal container. Flushing them down  the toilet. Check the label or package insert of your medicine to see whether this is safe to do. Throwing them out in the trash. Check the label or package insert of your medicine to see whether this is safe to do. If it is safe to throw it out, remove the medicine from the original container, put it into a sealable bag or container, and mix it with used coffee grounds, food scraps, dirt, or cat litter before putting it in the trash. Follow these instructions at home: Activity Do exercises as told by your health care provider. Avoid activities that make your pain worse. Return to your normal activities as told by your health care provider. Ask your health care provider what activities are safe for you. General instructions You may need to take these actions to prevent or treat constipation: Drink enough fluid to keep your urine pale yellow. Take over-the-counter or prescription medicines. Eat foods that are high in fiber, such as beans, whole grains, and fresh fruits and vegetables. Limit foods that are high in fat and processed sugars, such as fried or sweet foods. Keep all follow-up visits. This is important. Where to find support If you have been taking opioids for a long time, you may benefit from receiving support for quitting from a local support group or counselor. Ask your health care provider for a referral to these resources in your area. Where to find more information Centers for Disease Control and Prevention (CDC): http://www.wolf.info/ [place] Food and Drug Administration (FDA): [place] Get help right away if: You may have taken too much of an opioid (overdosed). Common symptoms of an overdose: Your breathing is slower or more shallow than normal. You have a very  slow heartbeat (pulse). You have slurred speech. You have nausea and vomiting. Your pupils become very small. You have other potential symptoms: You are very confused. You faint or feel like you will faint. You have cold, clammy skin. You have blue lips or fingernails. You have thoughts of harming yourself or harming others. These symptoms may represent a serious problem that is an emergency. Do not wait to see if the symptoms will go away. Get medical help right away. Call your local emergency services (911 in the [place] Do not drive yourself to the hospital.  If you ever feel like you may hurt yourself or others, or have thoughts about taking your own life, get help right away. Go to your nearest emergency department or: Call your local emergency services (911 in the [place] Call the [facility] 316-285-6081 in the [place] Call a suicide crisis helpline, such as the [place] at 385-868-2414 or 988 in the [place]. This is open 24 hours a day in the [place] Text the Crisis Text Line at 701-413-7247 (in the [place].). Summary Opioid medicines can help you manage moderate to severe pain for a short period of time. A pain treatment plan is an agreement between you and your health care provider. Discuss the goals of your treatment, including how much pain you might expect to have and how you will manage the pain. If you think that you or someone else may have taken too much of an opioid, get medical help right away. This information is not intended to replace advice given to you by your health care provider. Make sure you discuss any questions you have with your health care provider. Document Revised: 12/07/2020 Document Reviewed: 08/24/2020 Elsevier Patient Education  [place].

## 2022-05-02 NOTE — Progress Notes (Signed)
Subjective:   Victoria Harris is a 71 y.o. female who presents for Medicare Annual (Subsequent) preventive examination.  Review of Systems    No ROS.  Medicare Wellness Virtual Visit.  Visual/audio telehealth visit, UTA vital signs.   See social history for additional risk factors.   Cardiac Risk Factors include: advanced age (>21mn, >>28women);diabetes mellitus;hypertension     Objective:    Today's Vitals   05/02/22 0952  Weight: 170 lb (77.1 kg)  Height: '5\' 5"'$  (1.651 m)   Body mass index is 28.29 kg/m.     05/02/2022   10:08 AM 09/10/2021    1:18 PM 04/26/2021   10:08 AM 04/25/2020    9:48 AM 03/15/2020   11:23 AM 12/02/2019   12:17 PM 04/21/2019    9:40 AM  Advanced Directives  Does Patient Have a Medical Advance Directive? Yes No Yes Yes No Yes Yes  Type of AParamed[place]of [place] will  [place] will [place] will  [place] will [place]st HomeLiving will  Does patient want to make changes to medical advance directive? No - Patient declined  No - Patient declined No - Patient declined   No - Patient declined  Copy of [place] Chart? No - copy requested  No - copy requested No - copy requested  No - copy requested No - copy requested  Would patient like information on creating a medical advance directive?     No - Patient declined      Current Medications (verified) Outpatient Encounter Medications as of 05/02/2022  Medication Sig   diclofenac (VOLTAREN) 75 MG EC tablet Take 75 mg by mouth 2 (two) times daily.   meloxicam (MOBIC) 7.5 MG tablet Take 1 tablet (7.5 mg total) by mouth daily. As needed   traMADol (ULTRAM) 50 MG tablet Take 50 mg by mouth 4 (four) times daily.   aspirin 81 MG tablet Take 81 mg by mouth daily.   Cholecalciferol 25 MCG (1000 UT) tablet Take 1,000 Units by mouth daily.     dorzolamide-timolol (COSOPT) 22.3-6.8 MG/ML ophthalmic solution Place 1 drop into both eyes 2 (two) times daily.   glucose blood (ONETOUCH VERIO) test strip USE AS INSTRUCTED TO CHECK BLOOD SUGARS DAILY. DX E11.9   latanoprost (XALATAN) 0.005 % ophthalmic solution 1 drop at bedtime.   Multiple Vitamins-Minerals (MULTIVITAMIN ADULT PO) Take by mouth daily.   Probiotic Product (PROBIOTIC DAILY) CAPS Take 1 capsule by mouth daily.   rosuvastatin (CRESTOR) 5 MG tablet Take 1 tablet (5 mg total) by mouth daily.   No facility-administered encounter medications on file as of 05/02/2022.    Allergies (verified) Glucophage xr [metformin] and Jardiance [empagliflozin]   History: Past Medical History:  Diagnosis Date   Colon polyps    hyperplastic   Diabetes mellitus without complication ([place]    Diverticulosis    Hx of adenomatous colonic polyps    tubular adenoma   Hypercholesterolemia    Serrated adenoma of colon    Stroke (cerebrum) ([place]    Thrombocytopenia ([place]    Uterus descensus    Past Surgical History:  Procedure Laterality Date   ABDOMINAL HYSTERECTOMY  2009   CHOLECYSTECTOMY  2007   COLONOSCOPY N/A 04/05/2015   Procedure: COLONOSCOPY;  Surgeon: MLollie Sails MD;  [facility];  Service: Endoscopy;  Laterality: N/A;   COLONOSCOPY WITH PROPOFOL N/A 10/09/2016   Procedure: COLONOSCOPY WITH PROPOFOL;  Surgeon: Lollie Sails, MD;  [facility];  Service: Endoscopy;  Laterality: N/A;   COLONOSCOPY WITH PROPOFOL N/A 12/02/2019   Procedure: COLONOSCOPY WITH PROPOFOL;  Surgeon: Toledo, Benay Pike, MD;  Location: [facility] ENDOSCOPY;  Service: Gastroenterology;  Laterality: N/A;   DILATION AND CURETTAGE OF UTERUS  2001 and 2006   FOOT SURGERY  2007   TONSILLECTOMY  1960   TUBAL LIGATION     vocal cord cyst removed     Family History  Problem Relation Age of Onset   Colon cancer Mother    Diabetes Mother    Kidney disease Mother    Heart disease Mother     Prostate cancer Father    Diabetes Father    Glaucoma Father    Uterine cancer Maternal Aunt    Breast cancer Paternal Aunt        x2   Stroke Paternal Grandmother    Stroke Paternal Grandfather    Diabetes Brother    Diabetes Brother    Diabetes Other    Social History   Socioeconomic History   Marital status: Married    Spouse name: Not on file   Number of children: Not on file   Years of education: Not on file   Highest education level: Not on file  Occupational History   Not on file  Tobacco Use   Smoking status: Never   Smokeless tobacco: Never  Vaping Use   Vaping Use: Never used  Substance and Sexual Activity   Alcohol use: No    Alcohol/week: 0.0 standard drinks of alcohol   Drug use: No   Sexual activity: Not Currently  Other Topics Concern   Not on file  Social History Narrative   Not on file   Social Determinants of Health   Financial Resource Strain: Low Risk  (05/02/2022)   Overall Financial Resource Strain (CARDIA)    Difficulty of Paying Living Expenses: Not hard at all  Food Insecurity: No Food Insecurity (05/02/2022)   Hunger Vital Sign    Worried About Running Out of Food in the Last Year: Never true    Ran Out of Food in the Last Year: Never true  Transportation Needs: No Transportation Needs (05/02/2022)   PRAPARE - Hydrologist (Medical): No    Lack of Transportation (Non-Medical): No  Physical Activity: Insufficiently Active (05/02/2022)   Exercise Vital Sign    Days of Exercise per Week: 2 days    Minutes of Exercise per Session: 30 min  Stress: No Stress Concern Present (05/02/2022)   [place]    Feeling of Stress : Not at all  Social Connections: Unknown (05/02/2022)   Social Connection and Isolation Panel [NHANES]    Frequency of Communication with Friends and Family: Not on file    Frequency of Social Gatherings with Friends and Family:  Not on file    Attends Religious Services: Not on file    Active Member of Clubs or Organizations: Not on file    Attends Archivist Meetings: Not on file    Marital Status: Married    Tobacco Counseling Counseling given: Not Answered   Clinical Intake:  Pre-visit preparation completed: Yes        Diabetes: Yes (Followed by PCP)  How often do you need to have someone help you when you read instructions, pamphlets, or other written materials from your doctor or pharmacy?: 1 - Never  Interpreter Needed?: No      Activities of Daily Living    05/02/2022   10:10 AM  In your present state of health, do you have any difficulty performing the following activities:  Hearing? 0  Vision? 0  Difficulty concentrating or making decisions? 0  Walking or climbing stairs? 0  Dressing or bathing? 0  Doing errands, shopping? 0  Preparing Food and eating ? N  Using the Toilet? N  In the past six months, have you accidently leaked urine? N  Do you have problems with loss of bowel control? N  Managing your Medications? N  Managing your Finances? N  Housekeeping or managing your Housekeeping? N    Patient Care Team: Einar Pheasant, MD as PCP - General (Internal Medicine)  Indicate any recent Medical Services you may have received from other than [facility] providers in the past year (date may be approximate).     Assessment:   This is a routine wellness examination for [place].  I connected with  Victoria Harris on 05/02/22 by a audio enabled telemedicine application and verified that I am speaking with the correct person using two identifiers.  Patient Location: Home  Provider Location: Office/Clinic  I discussed the limitations of evaluation and management by telemedicine. The patient expressed understanding and agreed to proceed.   Hearing/Vision screen Hearing Screening - Comments:: Patient is able to hear conversational tones without difficulty. No  issues reported.  Vision Screening - Comments:: Followed by [facility] Wears corrective lenses OV every 6 months; glaucoma; drops in use They have regular follow up with the ophthalmologist    Dietary issues and exercise activities discussed: Current Exercise Habits: Home exercise routine, Type of exercise: stretching, Intensity: Mild Starting physical [facility] at [facility] 05/03/22. Current chiropractor visits 1-2x weekly. Stretching/chair exercises   Goals Addressed               This Visit's Progress     Patient Stated     Monitor Blood Pressure (pt-stated)        Healthy diet Blood sugars controlled       Depression Screen    05/02/2022   10:06 AM 03/28/2022    9:03 AM 09/12/2021    8:31 AM 04/26/2021   10:07 AM 12/14/2020   11:57 AM 04/25/2020    9:47 AM 03/15/2020   11:22 AM  PHQ 2/9 Scores  PHQ - 2 Score 0 0 0 0 0 0 0    Fall Risk    05/02/2022   10:09 AM 03/28/2022    9:03 AM 09/12/2021    8:31 AM 04/26/2021   10:10 AM 12/14/2020   11:57 AM  Fall Risk   Falls in the past year? 0 0 0 1 0  Number falls in past yr: 0 0   0  Injury with Fall? 0 0   0  Risk for fall due to :  No Fall Risks No Fall Risks    Follow up Falls evaluation completed;Falls prevention discussed Falls evaluation completed Falls evaluation completed  Falls evaluation completed    FALL RISK PREVENTION PERTAINING TO THE HOME: Home free of loose throw rugs in walkways, pet beds, electrical cords, etc? Yes  Adequate lighting in your home to reduce risk of falls? Yes   ASSISTIVE DEVICES UTILIZED TO PREVENT FALLS: Life alert? No  Use of a cane, walker or w/c? No  Grab bars in the bathroom? Yes  Shower chair or bench in shower? No  Elevated toilet  seat or a handicapped toilet? No  TIMED UP AND GO: Was the test performed? No .   Cognitive Function:    04/16/2018    9:32 AM 04/02/2017   10:44 AM  MMSE - Mini Mental State Exam  Orientation to time 5 5  Orientation to Place  5 5  Registration 3 3  Attention/ Calculation 5 5  Recall 3 3  Language- name 2 objects 2 2  Language- repeat 1 1  Language- follow 3 step command 3 3  Language- read & follow direction 1 1  Write a sentence 1 1  Copy design 1 1  Total score 30 30        05/02/2022   10:11 AM 04/26/2021   10:27 AM 04/21/2019    9:47 AM  6CIT Screen  What Year? 0 points 0 points 0 points  What month? 0 points 0 points 0 points  What time? 0 points 0 points 0 points  Count back from 20 0 points  0 points  Months in reverse 0 points 0 points 0 points  Repeat phrase 0 points  0 points  Total Score 0 points  0 points    Immunizations Immunization History  Administered Date(s) Administered   Fluad Quad(high Dose 65+) 04/21/2019, 04/29/2020, 03/28/2022   Influenza, High Dose Seasonal PF 03/23/2016, 04/04/2017, 04/18/2018, 05/30/2021   Influenza,inj,Quad PF,6+ Mos 05/25/2015   Pneumococcal Conjugate-13 07/19/2015   Pneumococcal Polysaccharide-23 11/29/2016   Zoster Recombinat (Shingrix) 04/14/2021, 08/14/2021   Zoster, Live 09/17/2013   TDAP status: Due, Education has been provided regarding the importance of this vaccine. Advised may receive this vaccine at local pharmacy or Health Dept. Aware to provide a copy of the vaccination record if obtained from local pharmacy or Health Dept. Verbalized acceptance and understanding.  Screening Tests Health Maintenance  Topic Date Due   DTaP/Tdap/Td (1 - Tdap) Never done   Diabetic kidney evaluation - Urine ACR  04/24/2022   OPHTHALMOLOGY EXAM  05/11/2022   FOOT EXAM  05/18/2022   HEMOGLOBIN A1C  09/25/2022   MAMMOGRAM  12/21/2022   Diabetic kidney evaluation - GFR measurement  03/27/2023   Medicare Annual Wellness (AWV)  05/03/2023   COLONOSCOPY (Pts 45-75yr Insurance coverage will need to be confirmed)  12/01/2029   Pneumonia Vaccine 71 Years old  Completed   INFLUENZA VACCINE  Completed   DEXA SCAN  Completed   Hepatitis C Screening   Completed   Zoster Vaccines- Shingrix  Completed   HPV VACCINES  Aged Out   COVID-19 Vaccine  Discontinued    Health Maintenance Health Maintenance Due  Topic Date Due   DTaP/Tdap/Td (1 - Tdap) Never done   Diabetic kidney evaluation - Urine ACR  04/24/2022   Lung Cancer Screening: (Low Dose CT Chest recommended if Age 72-80years, 30 pack-year currently smoking OR have quit w/in 15years.) does not qualify.   Hepatitis C Screening: Completed 2018.  Vision Screening: Recommended annual ophthalmology exams for early detection of glaucoma and other disorders of the eye.  Dental Screening: Recommended annual dental exams for proper oral hygiene.  Community Resource Referral / Chronic Care Management: CRR required this visit?  No   CCM required this visit?  No      Plan:     I have personally reviewed and noted the following in the patient's chart:   Medical and social history Use of alcohol, tobacco or illicit drugs  Current medications and supplements including opioid prescriptions. Patient is currently taking opioid prescriptions.  Information provided to patient regarding non-opioid alternatives. Patient advised to discuss non-opioid treatment plan with their provider. Taking Tramadol. Followed by [facility].  Functional ability and status Nutritional status Physical activity Advanced directives List of other physicians Hospitalizations, surgeries, and ER visits in previous 12 months Vitals Screenings to include cognitive, depression, and falls Referrals and appointments  In addition, I have reviewed and discussed with patient certain preventive protocols, quality metrics, and best practice recommendations. A written personalized care plan for preventive services as well as general preventive health recommendations were provided to patient.     Leta Jungling, LPN   [id number]

## 2022-05-03 DIAGNOSIS — M6281 Muscle weakness (generalized): Secondary | ICD-10-CM | POA: Diagnosis not present

## 2022-05-03 DIAGNOSIS — M25551 Pain in right hip: Secondary | ICD-10-CM | POA: Diagnosis not present

## 2022-05-03 DIAGNOSIS — M545 Low back pain, unspecified: Secondary | ICD-10-CM | POA: Diagnosis not present

## 2022-05-06 ENCOUNTER — Other Ambulatory Visit: Payer: Self-pay

## 2022-05-06 ENCOUNTER — Emergency Department
Admission: EM | Admit: 2022-05-06 | Discharge: 2022-05-06 | Disposition: A | Discharge status: Home / Self Care | Payer: PPO | Attending: Emergency Medicine | Admitting: Emergency Medicine

## 2022-05-06 ENCOUNTER — Emergency Department: Payer: PPO

## 2022-05-06 DIAGNOSIS — M5440 Lumbago with sciatica, unspecified side: Secondary | ICD-10-CM | POA: Diagnosis not present

## 2022-05-06 DIAGNOSIS — M5430 Sciatica, unspecified side: Secondary | ICD-10-CM

## 2022-05-06 DIAGNOSIS — M545 Low back pain, unspecified: Secondary | ICD-10-CM | POA: Diagnosis not present

## 2022-05-06 DIAGNOSIS — A0472 Enterocolitis due to Clostridium difficile, not specified as recurrent: Secondary | ICD-10-CM | POA: Diagnosis not present

## 2022-05-06 DIAGNOSIS — R197 Diarrhea, unspecified: Secondary | ICD-10-CM | POA: Diagnosis not present

## 2022-05-06 DIAGNOSIS — R112 Nausea with vomiting, unspecified: Secondary | ICD-10-CM | POA: Diagnosis present

## 2022-05-06 LAB — CBC WITH DIFFERENTIAL/PLATELET
Abs Immature Granulocytes: 0.03 10*3/uL (ref 0.00–0.07)
Basophils Absolute: 0 10*3/uL (ref 0.0–0.1)
Basophils Relative: 0 %
Eosinophils Absolute: 0.1 10*3/uL (ref 0.0–0.5)
Eosinophils Relative: 1 %
HCT: 45.7 % (ref 36.0–46.0)
Hemoglobin: 16 g/dL — ABNORMAL HIGH (ref 12.0–15.0)
Immature Granulocytes: 0 %
Lymphocytes Relative: 23 %
Lymphs Abs: 2.1 10*3/uL (ref 0.7–4.0)
MCH: 31.3 pg (ref 26.0–34.0)
MCHC: 35 g/dL (ref 30.0–36.0)
MCV: 89.3 fL (ref 80.0–100.0)
Monocytes Absolute: 0.4 10*3/uL (ref 0.1–1.0)
Monocytes Relative: 5 %
Neutro Abs: 6.4 10*3/uL (ref 1.7–7.7)
Neutrophils Relative %: 71 %
Platelets: 262 10*3/uL (ref 150–400)
RBC: 5.12 MIL/uL — ABNORMAL HIGH (ref 3.87–5.11)
RDW: 12.4 % (ref 11.5–15.5)
WBC: 9 10*3/uL (ref 4.0–10.5)
nRBC: 0 % (ref 0.0–0.2)

## 2022-05-06 LAB — URINALYSIS, ROUTINE W REFLEX MICROSCOPIC
Bilirubin Urine: NEGATIVE
Glucose, UA: NEGATIVE mg/dL
Hgb urine dipstick: NEGATIVE
Ketones, ur: 80 mg/dL — AB
Nitrite: NEGATIVE
Protein, ur: 30 mg/dL — AB
Specific Gravity, Urine: 1.025 (ref 1.005–1.030)
pH: 5 (ref 5.0–8.0)

## 2022-05-06 LAB — C DIFFICILE QUICK SCREEN W PCR REFLEX
C Diff antigen: POSITIVE — AB
C Diff toxin: NEGATIVE

## 2022-05-06 LAB — COMPREHENSIVE METABOLIC PANEL
ALT: 24 U/L (ref 0–44)
AST: 24 U/L (ref 15–41)
Albumin: 4.4 g/dL (ref 3.5–5.0)
Alkaline Phosphatase: 61 U/L (ref 38–126)
Anion gap: 11 (ref 5–15)
BUN: 24 mg/dL — ABNORMAL HIGH (ref 8–23)
CO2: 19 mmol/L — ABNORMAL LOW (ref 22–32)
Calcium: 9.6 mg/dL (ref 8.9–10.3)
Chloride: 107 mmol/L (ref 98–111)
Creatinine, Ser: 0.51 mg/dL (ref 0.44–1.00)
GFR, Estimated: >60 mL/min (ref >60)
Glucose, Bld: 156 mg/dL — ABNORMAL HIGH (ref 70–99)
Potassium: 3.5 mmol/L (ref 3.5–5.1)
Sodium: 137 mmol/L (ref 135–145)
Total Bilirubin: 1.2 mg/dL (ref 0.3–1.2)
Total Protein: 7.3 g/dL (ref 6.5–8.1)

## 2022-05-06 LAB — GASTROINTESTINAL PANEL BY PCR, STOOL (REPLACES STOOL CULTURE)

## 2022-05-06 LAB — CBC
HCT: 45.7 % (ref 36.0–46.0)
Hemoglobin: 16.1 g/dL — ABNORMAL HIGH (ref 12.0–15.0)
MCH: 31.3 pg (ref 26.0–34.0)
MCHC: 35.2 g/dL (ref 30.0–36.0)
MCV: 88.9 fL (ref 80.0–100.0)
Platelets: 249 10*3/uL (ref 150–400)
RBC: 5.14 MIL/uL — ABNORMAL HIGH (ref 3.87–5.11)
RDW: 12.3 % (ref 11.5–15.5)
WBC: 9.2 10*3/uL (ref 4.0–10.5)
nRBC: 0 % (ref 0.0–0.2)

## 2022-05-06 LAB — LIPASE, BLOOD: Lipase: 36 U/L (ref 11–51)

## 2022-05-06 LAB — CLOSTRIDIUM DIFFICILE BY PCR, REFLEXED: Toxigenic C. Difficile by PCR: POSITIVE — AB

## 2022-05-06 MED ORDER — ONDANSETRON 4 MG PO TBDP: 4.0000 mg | ORAL_TABLET | Freq: Three times a day (TID) | ORAL | 0 refills | Status: DC | PRN

## 2022-05-06 MED ORDER — VANCOMYCIN HCL 125 MG PO CAPS: 125.0000 mg | ORAL_CAPSULE | Freq: Four times a day (QID) | ORAL | 0 refills | Status: DC

## 2022-05-06 MED ORDER — METHYLPREDNISOLONE SODIUM SUCC 125 MG IJ SOLR
60.0000 mg | Freq: Once | INTRAMUSCULAR | Status: AC
  Administered 2022-05-06: 60 mg via INTRAVENOUS
  Filled 2022-05-06: qty 2

## 2022-05-06 MED ORDER — ONDANSETRON HCL 4 MG/2ML IJ SOLN
4.0000 mg | Freq: Once | INTRAMUSCULAR | Status: AC
  Administered 2022-05-06: 4 mg via INTRAVENOUS
  Filled 2022-05-06: qty 2

## 2022-05-06 MED ORDER — MORPHINE SULFATE (PF) 4 MG/ML IV SOLN
4.0000 mg | Freq: Once | INTRAVENOUS | Status: AC
  Administered 2022-05-06: 4 mg via INTRAVENOUS
  Filled 2022-05-06: qty 1

## 2022-05-06 MED ORDER — SODIUM CHLORIDE 0.9 % IV SOLN: Freq: Once | INTRAVENOUS | Status: AC

## 2022-05-06 MED ORDER — HYDROCODONE-ACETAMINOPHEN 5-325 MG PO TABS: 1.0000 | ORAL_TABLET | Freq: Four times a day (QID) | ORAL | 0 refills | Status: DC | PRN

## 2022-05-06 MED ORDER — VANCOMYCIN HCL 125 MG PO CAPS
125.0000 mg | ORAL_CAPSULE | Freq: Four times a day (QID) | ORAL | Status: DC
  Administered 2022-05-06: 125 mg via ORAL
  Filled 2022-05-06 (×2): qty 1

## 2022-05-06 MED ORDER — ONDANSETRON 4 MG PO TBDP
4.0000 mg | ORAL_TABLET | Freq: Once | ORAL | Status: AC
  Administered 2022-05-06: 4 mg via ORAL
  Filled 2022-05-06: qty 1

## 2022-05-06 NOTE — ED Provider Notes (Signed)
[facility] Provider Note    Event Date/Time   First MD Initiated Contact with Patient 05/06/22 0820     (approximate)   History   Emesis and Back Pain   HPI  Keon Benscoter is a 71 y.o. female who complains of low back pain gradually increasing in severity since the beginning of October.  It is particularly gotten worse in the last couple weeks.  Pain radiates down the back of the leg.  Getting up from sitting positions very difficult.  Patient also reports nausea with diarrhea for the last day and a half.  She denies any pain or shortness of breath.  She specifically denies vomiting to me.  The stool is golden in color and somewhat greasy she says.  It started after she took medicine for her low back pain.      Physical Exam   Triage Vital Signs: ED Triage Vitals [05/06/22 0534]  Enc Vitals Group     BP 124/88     Pulse Rate 91     Resp 20     Temp 98 F (36.7 C)     Temp Source Oral     SpO2 98 %     Weight      Height      Head Circumference      Peak Flow      Pain Score      Pain Loc      Pain Edu?      Excl. in [place]?     Most recent vital signs: Vitals:   05/06/22 1300 05/06/22 1415  BP:  109/78  Pulse: 75 73  Resp:  (!) 21  Temp:  98.8 F (37.1 C)  SpO2: 90% 95%     General: Awake, uncomfortable from the back pain CV:  Good peripheral perfusion.  Heart regular rate and rhythm no audible murmurs Resp:  Normal effort.  Lungs are clear Abd:  No distention.  Bowel sounds positive soft and nontender Extremity straight leg raise is negative no numbness no weakness   ED Results / Procedures / Treatments   Labs (all labs ordered are listed, but only abnormal results are displayed) Labs Reviewed  C DIFFICILE QUICK SCREEN W PCR REFLEX   - Abnormal; Notable for the following components:      Result Value   C Diff antigen POSITIVE (*)    All other components within normal limits  CLOSTRIDIUM DIFFICILE BY PCR, REFLEXED -  Abnormal; Notable for the following components:   Toxigenic C. Difficile by PCR POSITIVE (*)    All other components within normal limits  COMPREHENSIVE METABOLIC PANEL - Abnormal; Notable for the following components:   CO2 19 (*)    Glucose, Bld 156 (*)    BUN 24 (*)    All other components within normal limits  CBC - Abnormal; Notable for the following components:   RBC 5.14 (*)    Hemoglobin 16.1 (*)    All other components within normal limits  URINALYSIS, ROUTINE W REFLEX MICROSCOPIC - Abnormal; Notable for the following components:   Color, Urine AMBER (*)    APPearance CLOUDY (*)    Ketones, ur 80 (*)    Protein, ur 30 (*)    Leukocytes,Ua TRACE (*)    Bacteria, UA FEW (*)    All other components within normal limits  CBC WITH DIFFERENTIAL/PLATELET - Abnormal; Notable for the following components:   RBC 5.12 (*)    Hemoglobin 16.0 (*)  All other components within normal limits  GASTROINTESTINAL PANEL BY PCR, STOOL (REPLACES STOOL CULTURE)  LIPASE, BLOOD     EKG     RADIOLOGY MRI of the back shows impingement on S1 which is consistent with the patient's history.  The patient does not have any numbness or weakness in the legs.  Just pain radiating down the leg.   PROCEDURES:  Critical Care performed:   Procedures   MEDICATIONS ORDERED IN ED: Medications  vancomycin (VANCOCIN) capsule 125 mg (125 mg Oral Given 05/06/22 1442)  ondansetron (ZOFRAN-ODT) disintegrating tablet 4 mg (4 mg Oral Given 05/06/22 0536)  0.9 %  sodium chloride infusion (0 mLs Intravenous Stopped 05/06/22 1443)  morphine (PF) 4 MG/ML injection 4 mg (4 mg Intravenous Given 05/06/22 1218)  ondansetron (ZOFRAN) injection 4 mg (4 mg Intravenous Given 05/06/22 1218)  methylPREDNISolone sodium succinate (SOLU-MEDROL) 125 mg/2 mL injection 60 mg (60 mg Intravenous Given 05/06/22 1218)     IMPRESSION / MDM / ASSESSMENT AND PLAN / ED COURSE  I reviewed the triage vital signs and the nursing  notes. Patient's C. difficile came back positive.  I discussed her sciatica with Dr. Cari Caraway the neurosurgeon.  He can follow her up in the clinic.  It has been over 2 months of pain for the patient.  Now she is having a lot of trouble getting out of bed and time to make it to the toilet in time and mostly missing and pooping on the floor.  I have given her some pain medication for this.  I discussed her briefly with Dr.Wohl, gastroenterology is always worried that the pain meds may delay bowel transit and make the C. difficile worse but he was not worried about this.  I will treat her C. difficile with p.o. vancomycin 125 4 times daily as suggested by up-to-date.  She will return if worse.  She will follow-up with her doctors. Differential diagnosis initially was infectious diarrhea or inflammatory diarrhea or for C. difficile.  Patient's presentation is most consistent with acute presentation with potential threat to life or bodily function.  he patient is on the cardiac monitor to evaluate for evidence of arrhythmia and/or significant heart rate changes.  None have been seen     FINAL CLINICAL IMPRESSION(S) / ED DIAGNOSES   Final diagnoses:  C. difficile diarrhea  Sciatica, unspecified laterality     Rx / DC Orders   ED Discharge Orders          Ordered    HYDROcodone-acetaminophen (NORCO/VICODIN) 5-325 MG tablet  Every 6 hours PRN        05/06/22 1412    vancomycin (VANCOCIN) 125 MG capsule  4 times daily        05/06/22 1412    ondansetron (ZOFRAN-ODT) 4 MG disintegrating tablet  Every 8 hours PRN        05/06/22 1412             Note:  This document was prepared using Dragon voice recognition software and may include unintentional dictation errors.   Nena Polio, MD 05/06/22 [phone]

## 2022-05-06 NOTE — Discharge Instructions (Addendum)
Please use the Vicodin 1 pill 4 times a day as needed for pain.  This should make it easier for you to get up out of the bed to go to the bathroom.  Please take the vancomycin 1 pill 4 times a day.  This is the antibiotic and it should kill the C. difficile.  Please return for increasing pain or swelling in your belly or fever or bloody diarrhea or vomiting blood or feeling sicker.  Use the Zofran melt under tongue wafers 1 pill 4 times a day as needed for nausea.  Try clear liquids this afternoon.  Sip small amounts frequently.  Clear liquids include weak tea or flat soda or chicken broth or water or sports drinks or Jell-O or popsicles.  For dinner try the brat diet bananas rice applesauce toast and crackers.  You can also eat some regular chicken soup as it is not spicy.  Eat what ever you think you can keep down tomorrow.  Please follow-up with your doctor in about a week and make sure you are getting better.  Do not hesitate to return if you are worse.

## 2022-05-06 NOTE — ED Notes (Signed)
Patient Alert and oriented to baseline. Stable and ambulatory to baseline. Patient verbalized understanding of the discharge instructions.  Patient belongings were taken by the patient.   

## 2022-05-06 NOTE — ED Triage Notes (Signed)
Pt presents via POV with complaints of chronic low back pain - due to a bulging disc. Pt states that she has been nauseated and had intermittent vomiting & diarrhea for the last 1.5 days. Denies AP, CP, or SOB.

## 2022-05-07 ENCOUNTER — Encounter: Payer: Self-pay | Admitting: Internal Medicine

## 2022-05-07 ENCOUNTER — Other Ambulatory Visit: Payer: Self-pay

## 2022-05-07 DIAGNOSIS — M5441 Lumbago with sciatica, right side: Secondary | ICD-10-CM

## 2022-05-07 NOTE — Telephone Encounter (Signed)
sent 

## 2022-05-07 NOTE — Telephone Encounter (Signed)
Ok

## 2022-05-08 ENCOUNTER — Telehealth: Payer: Self-pay

## 2022-05-08 DIAGNOSIS — M533 Sacrococcygeal disorders, not elsewhere classified: Secondary | ICD-10-CM | POA: Insufficient documentation

## 2022-05-08 DIAGNOSIS — S39012A Strain of muscle, fascia and tendon of lower back, initial encounter: Secondary | ICD-10-CM | POA: Insufficient documentation

## 2022-05-08 DIAGNOSIS — M47816 Spondylosis without myelopathy or radiculopathy, lumbar region: Secondary | ICD-10-CM | POA: Diagnosis not present

## 2022-05-08 DIAGNOSIS — M5416 Radiculopathy, lumbar region: Secondary | ICD-10-CM | POA: Diagnosis not present

## 2022-05-08 DIAGNOSIS — M4316 Spondylolisthesis, lumbar region: Secondary | ICD-10-CM | POA: Insufficient documentation

## 2022-05-08 DIAGNOSIS — M545 Low back pain, unspecified: Secondary | ICD-10-CM | POA: Diagnosis not present

## 2022-05-08 DIAGNOSIS — M47896 Other spondylosis, lumbar region: Secondary | ICD-10-CM | POA: Diagnosis not present

## 2022-05-08 NOTE — Telephone Encounter (Signed)
LM for pt to cb 

## 2022-05-08 NOTE — Telephone Encounter (Signed)
Patient states she is returning our call.  I read Dr. Randell Patient Scott's message to patient.

## 2022-05-08 NOTE — Telephone Encounter (Signed)
If has seen ortho and having persistent issues, I would notify them - and have them reevaluate.  Need note from office visit if do not have.

## 2022-05-08 NOTE — Telephone Encounter (Signed)
Patient states the pain medication we gave her is not working well.  Patient states she has an appointment scheduled with neurologist is next week, but she needs something else for pain.  Patient states Denita Lung, CMA, can call her.

## 2022-05-09 ENCOUNTER — Ambulatory Visit: Payer: PPO | Admitting: Orthopedic Surgery

## 2022-05-11 ENCOUNTER — Telehealth: Payer: Self-pay

## 2022-05-11 NOTE — Telephone Encounter (Signed)
Spoke w/ patient - stated hadnt had a bowel movement since Monday But soon after she called, she ended up having a normal bowel movement. Stated no diarrhea, no hard stool - was good.  Pt does not feel like she needs to be seen.

## 2022-05-11 NOTE — Telephone Encounter (Signed)
Pt called stating she was returning [place] phone call.  Phone call was transferred to [place].

## 2022-05-11 NOTE — Telephone Encounter (Signed)
See phone note

## 2022-05-11 NOTE — Telephone Encounter (Signed)
Lm for libby to cb

## 2022-05-11 NOTE — Telephone Encounter (Signed)
Patient called and says now she has not had a stool since Monday morning. She would like to know what to take.

## 2022-05-16 ENCOUNTER — Ambulatory Visit: Payer: PPO | Admitting: Orthopedic Surgery

## 2022-05-16 ENCOUNTER — Telehealth: Payer: Self-pay

## 2022-05-16 DIAGNOSIS — Z6827 Body mass index (BMI) 27.0-27.9, adult: Secondary | ICD-10-CM | POA: Diagnosis not present

## 2022-05-16 DIAGNOSIS — M5416 Radiculopathy, lumbar region: Secondary | ICD-10-CM | POA: Diagnosis not present

## 2022-05-16 NOTE — Telephone Encounter (Signed)
Patient's husband, Kylen Ismael, stopped by to state patient needs a refill for her HYDROcodone-acetaminophen (NORCO/VICODIN) 5-325 MG tablet.  Coralyn Mark states patient's surgery is being postponed to the first of the year and they need enough pain medication to get them through to the surgery.  Coralyn Mark states he would like to receive a call from [place].  [facility] [place] [facility] preferred [facility].

## 2022-05-16 NOTE — Telephone Encounter (Signed)
Please clarify how often she is taking the hydrocodone and date of surgery.  Thanks

## 2022-05-16 NOTE — Telephone Encounter (Signed)
Patient is taking the hydrocodone every 6 hrs. Per the ED instructions. Dr. Trenton Gammon stated to take it every 4 hrs. . Her surgery is on January 4th at 4:30 am. She takes a muscle relaxer at night . She does not mix the hydrocodone and the muscle relaxer. She takes the hydrocodone during the day and the muscle relaxer at night.

## 2022-05-17 ENCOUNTER — Encounter: Payer: Self-pay | Admitting: Podiatry

## 2022-05-17 ENCOUNTER — Ambulatory Visit: Payer: PPO | Admitting: Podiatry

## 2022-05-17 DIAGNOSIS — E119 Type 2 diabetes mellitus without complications: Secondary | ICD-10-CM | POA: Diagnosis not present

## 2022-05-17 DIAGNOSIS — M2012 Hallux valgus (acquired), left foot: Secondary | ICD-10-CM | POA: Diagnosis not present

## 2022-05-17 DIAGNOSIS — B351 Tinea unguium: Secondary | ICD-10-CM

## 2022-05-17 DIAGNOSIS — D696 Thrombocytopenia, unspecified: Secondary | ICD-10-CM

## 2022-05-17 DIAGNOSIS — M79674 Pain in right toe(s): Secondary | ICD-10-CM

## 2022-05-17 MED ORDER — HYDROCODONE-ACETAMINOPHEN 5-325 MG PO TABS: ORAL_TABLET | ORAL | 0 refills | Status: DC

## 2022-05-17 NOTE — Telephone Encounter (Signed)
[facility] to [facility] [place]

## 2022-05-17 NOTE — Telephone Encounter (Signed)
Pt husband advcised

## 2022-05-17 NOTE — Progress Notes (Signed)
This patient returns to my office for at risk foot care.  This patient requires this care by a professional since this patient will be at risk due to having diabetes and throbocytopenia..    This patient is unable to cut nails herself since the patient cannot reach her nails.These nails are painful walking and wearing shoes.  This patient presents for at risk foot care today.  General Appearance  Alert, conversant and in no acute stress.  Vascular  Dorsalis pedis and posterior tibial  pulses are palpable  bilaterally.  Capillary return is within normal limits  bilaterally. Temperature is within normal limits  bilaterally.  Neurologic  Senn-Weinstein monofilament wire test within normal limits  bilaterally. Muscle power within normal limits bilaterally.  Nails Thick disfigured discolored nails with subungual debris  from hallux to fifth toes bilaterally. No evidence of bacterial infection or drainage bilaterally.  Orthopedic  No limitations of motion  feet .  No crepitus or effusions noted.  No bony pathology or digital deformities noted. Plantar flexed 2,3 right foot.  Skin  normotropic skin  noted bilaterally.  No signs of infections or ulcers noted.   Asymptomatic porokeratosis sub 2 right foot.  Onychomycosis  Pain in right toes  Pain in left toes  Consent was obtained for treatment procedures.   Mechanical debridement of nails 1-5  bilaterally performed with a nail nipper.  Filed with dremel without incident.    Return office visit  4 months                   Told patient to return for periodic foot care and evaluation due to potential at risk complications.   Gardiner Barefoot DPM

## 2022-05-17 NOTE — Telephone Encounter (Signed)
Patient's husband called, patient needs this medication today. He states patient can not wait any longer.

## 2022-05-24 ENCOUNTER — Other Ambulatory Visit: Payer: Self-pay

## 2022-05-24 ENCOUNTER — Telehealth: Payer: Self-pay | Admitting: Internal Medicine

## 2022-05-24 NOTE — Telephone Encounter (Signed)
Sent to [facility] for approval

## 2022-05-24 NOTE — Telephone Encounter (Signed)
Pt need refill on oxycodone sent to [facility]

## 2022-05-25 ENCOUNTER — Other Ambulatory Visit: Payer: Self-pay

## 2022-05-25 MED ORDER — HYDROCODONE-ACETAMINOPHEN 5-325 MG PO TABS: ORAL_TABLET | ORAL | 0 refills | Status: DC

## 2022-05-25 NOTE — Telephone Encounter (Signed)
Sent to [facility] for approval 

## 2022-05-25 NOTE — Telephone Encounter (Signed)
Patient called about note below. She will be out of medication by weekend.

## 2022-05-26 NOTE — Progress Notes (Signed)
Diarrhea

## 2022-05-28 HISTORY — PX: BACK SURGERY: SHX140

## 2022-05-31 ENCOUNTER — Emergency Department (HOSPITAL_BASED_OUTPATIENT_CLINIC_OR_DEPARTMENT_OTHER): Payer: PPO

## 2022-05-31 ENCOUNTER — Inpatient Hospital Stay (HOSPITAL_BASED_OUTPATIENT_CLINIC_OR_DEPARTMENT_OTHER)
Admission: EM | Admit: 2022-05-31 | Discharge: 2022-06-02 | DRG: 690 | Disposition: A | Discharge status: Home / Self Care | Payer: PPO | Attending: Family Medicine | Admitting: Family Medicine

## 2022-05-31 ENCOUNTER — Other Ambulatory Visit: Payer: Self-pay

## 2022-05-31 ENCOUNTER — Encounter (HOSPITAL_COMMUNITY): Payer: Self-pay

## 2022-05-31 ENCOUNTER — Encounter (HOSPITAL_BASED_OUTPATIENT_CLINIC_OR_DEPARTMENT_OTHER): Payer: Self-pay | Admitting: Emergency Medicine

## 2022-05-31 DIAGNOSIS — Z888 Allergy status to other drugs, medicaments and biological substances status: Secondary | ICD-10-CM | POA: Diagnosis not present

## 2022-05-31 DIAGNOSIS — R112 Nausea with vomiting, unspecified: Secondary | ICD-10-CM | POA: Diagnosis not present

## 2022-05-31 DIAGNOSIS — H409 Unspecified glaucoma: Secondary | ICD-10-CM

## 2022-05-31 DIAGNOSIS — Z7982 Long term (current) use of aspirin: Secondary | ICD-10-CM

## 2022-05-31 DIAGNOSIS — N12 Tubulo-interstitial nephritis, not specified as acute or chronic: Secondary | ICD-10-CM | POA: Diagnosis not present

## 2022-05-31 DIAGNOSIS — R197 Diarrhea, unspecified: Secondary | ICD-10-CM | POA: Diagnosis not present

## 2022-05-31 DIAGNOSIS — Z8673 Personal history of transient ischemic attack (TIA), and cerebral infarction without residual deficits: Secondary | ICD-10-CM

## 2022-05-31 DIAGNOSIS — R5383 Other fatigue: Secondary | ICD-10-CM | POA: Diagnosis not present

## 2022-05-31 DIAGNOSIS — Z9049 Acquired absence of other specified parts of digestive tract: Secondary | ICD-10-CM

## 2022-05-31 DIAGNOSIS — R079 Chest pain, unspecified: Secondary | ICD-10-CM | POA: Diagnosis not present

## 2022-05-31 DIAGNOSIS — R1084 Generalized abdominal pain: Secondary | ICD-10-CM

## 2022-05-31 DIAGNOSIS — N10 Acute tubulo-interstitial nephritis: Principal | ICD-10-CM | POA: Diagnosis present

## 2022-05-31 DIAGNOSIS — Z1152 Encounter for screening for COVID-19: Secondary | ICD-10-CM

## 2022-05-31 DIAGNOSIS — E119 Type 2 diabetes mellitus without complications: Secondary | ICD-10-CM | POA: Diagnosis present

## 2022-05-31 DIAGNOSIS — G8929 Other chronic pain: Secondary | ICD-10-CM | POA: Diagnosis present

## 2022-05-31 DIAGNOSIS — R6883 Chills (without fever): Secondary | ICD-10-CM | POA: Diagnosis not present

## 2022-05-31 DIAGNOSIS — Z9851 Tubal ligation status: Secondary | ICD-10-CM

## 2022-05-31 DIAGNOSIS — R109 Unspecified abdominal pain: Secondary | ICD-10-CM | POA: Diagnosis not present

## 2022-05-31 DIAGNOSIS — Z8719 Personal history of other diseases of the digestive system: Secondary | ICD-10-CM | POA: Diagnosis not present

## 2022-05-31 DIAGNOSIS — E7800 Pure hypercholesterolemia, unspecified: Secondary | ICD-10-CM

## 2022-05-31 DIAGNOSIS — R110 Nausea: Secondary | ICD-10-CM | POA: Diagnosis present

## 2022-05-31 DIAGNOSIS — Z833 Family history of diabetes mellitus: Secondary | ICD-10-CM | POA: Diagnosis not present

## 2022-05-31 DIAGNOSIS — Z823 Family history of stroke: Secondary | ICD-10-CM

## 2022-05-31 DIAGNOSIS — Z79899 Other long term (current) drug therapy: Secondary | ICD-10-CM

## 2022-05-31 DIAGNOSIS — Z86010 Personal history of colonic polyps: Secondary | ICD-10-CM | POA: Diagnosis not present

## 2022-05-31 DIAGNOSIS — B9620 Unspecified Escherichia coli [E. coli] as the cause of diseases classified elsewhere: Secondary | ICD-10-CM | POA: Diagnosis not present

## 2022-05-31 DIAGNOSIS — Z8249 Family history of ischemic heart disease and other diseases of the circulatory system: Secondary | ICD-10-CM

## 2022-05-31 DIAGNOSIS — Z90710 Acquired absence of both cervix and uterus: Secondary | ICD-10-CM

## 2022-05-31 DIAGNOSIS — Z8619 Personal history of other infectious and parasitic diseases: Secondary | ICD-10-CM

## 2022-05-31 DIAGNOSIS — E1159 Type 2 diabetes mellitus with other circulatory complications: Secondary | ICD-10-CM

## 2022-05-31 LAB — URINALYSIS, ROUTINE W REFLEX MICROSCOPIC
Bilirubin Urine: NEGATIVE
Glucose, UA: NEGATIVE mg/dL
Ketones, ur: 15 mg/dL — AB
Nitrite: NEGATIVE
Protein, ur: 100 mg/dL — AB
Specific Gravity, Urine: 1.018 (ref 1.005–1.030)
WBC, UA: >50 WBC/hpf — ABNORMAL HIGH (ref 0–5)
pH: 5.5 (ref 5.0–8.0)

## 2022-05-31 LAB — BASIC METABOLIC PANEL
Anion gap: 15 (ref 5–15)
BUN: 20 mg/dL (ref 8–23)
CO2: 25 mmol/L (ref 22–32)
Calcium: 10.1 mg/dL (ref 8.9–10.3)
Chloride: 97 mmol/L — ABNORMAL LOW (ref 98–111)
Creatinine, Ser: 0.78 mg/dL (ref 0.44–1.00)
GFR, Estimated: >60 mL/min (ref >60)
Glucose, Bld: 220 mg/dL — ABNORMAL HIGH (ref 70–99)
Potassium: 3.7 mmol/L (ref 3.5–5.1)
Sodium: 137 mmol/L (ref 135–145)

## 2022-05-31 LAB — CBC
HCT: 43.8 % (ref 36.0–46.0)
Hemoglobin: 15.3 g/dL — ABNORMAL HIGH (ref 12.0–15.0)
MCH: 32.1 pg (ref 26.0–34.0)
MCHC: 34.9 g/dL (ref 30.0–36.0)
MCV: 91.8 fL (ref 80.0–100.0)
Platelets: 184 10*3/uL (ref 150–400)
RBC: 4.77 MIL/uL (ref 3.87–5.11)
RDW: 12.5 % (ref 11.5–15.5)
WBC: 10.4 10*3/uL (ref 4.0–10.5)
nRBC: 0 % (ref 0.0–0.2)

## 2022-05-31 LAB — RESP PANEL BY RT-PCR (RSV, FLU A&B, COVID)  RVPGX2
Influenza A by PCR: NEGATIVE
Influenza B by PCR: NEGATIVE
Resp Syncytial Virus by PCR: NEGATIVE
SARS Coronavirus 2 by RT PCR: NEGATIVE

## 2022-05-31 LAB — GLUCOSE, CAPILLARY: Glucose-Capillary: 149 mg/dL — ABNORMAL HIGH (ref 70–99)

## 2022-05-31 LAB — CBG MONITORING, ED: Glucose-Capillary: 213 mg/dL — ABNORMAL HIGH (ref 70–99)

## 2022-05-31 MED ORDER — ENOXAPARIN SODIUM 40 MG/0.4ML IJ SOSY
40.0000 mg | PREFILLED_SYRINGE | INTRAMUSCULAR | Status: DC
  Administered 2022-05-31 – 2022-06-01 (×2): 40 mg via SUBCUTANEOUS
  Filled 2022-05-31 (×3): qty 0.4

## 2022-05-31 MED ORDER — FENTANYL CITRATE PF 50 MCG/ML IJ SOSY
50.0000 ug | PREFILLED_SYRINGE | Freq: Once | INTRAMUSCULAR | Status: AC
  Administered 2022-05-31: 50 ug via INTRAVENOUS
  Filled 2022-05-31: qty 1

## 2022-05-31 MED ORDER — RISAQUAD PO CAPS
1.0000 | ORAL_CAPSULE | Freq: Every day | ORAL | Status: DC
  Filled 2022-05-31: qty 1

## 2022-05-31 MED ORDER — SODIUM CHLORIDE 0.9 % IV BOLUS
1000.0000 mL | Freq: Once | INTRAVENOUS | Status: AC
  Administered 2022-05-31: 1000 mL via INTRAVENOUS

## 2022-05-31 MED ORDER — VANCOMYCIN HCL 125 MG PO CAPS
125.0000 mg | ORAL_CAPSULE | Freq: Four times a day (QID) | ORAL | Status: DC
  Administered 2022-05-31: 125 mg via ORAL
  Filled 2022-05-31: qty 1

## 2022-05-31 MED ORDER — SODIUM CHLORIDE 0.9% FLUSH
3.0000 mL | Freq: Two times a day (BID) | INTRAVENOUS | Status: DC
  Administered 2022-05-31 – 2022-06-02 (×2): 3 mL via INTRAVENOUS

## 2022-05-31 MED ORDER — HYDROCODONE-ACETAMINOPHEN 5-325 MG PO TABS
1.0000 | ORAL_TABLET | Freq: Four times a day (QID) | ORAL | Status: DC | PRN
  Administered 2022-06-01 – 2022-06-02 (×5): 1 via ORAL
  Filled 2022-05-31 (×5): qty 1

## 2022-05-31 MED ORDER — LATANOPROST 0.005 % OP SOLN
1.0000 [drp] | Freq: Every day | OPHTHALMIC | Status: DC
  Administered 2022-06-01: 1 [drp] via OPHTHALMIC
  Filled 2022-05-31: qty 2.5

## 2022-05-31 MED ORDER — IOHEXOL 300 MG/ML  SOLN
100.0000 mL | Freq: Once | INTRAMUSCULAR | Status: AC | PRN
  Administered 2022-05-31: 80 mL via INTRAVENOUS

## 2022-05-31 MED ORDER — DORZOLAMIDE HCL-TIMOLOL MAL 2-0.5 % OP SOLN
1.0000 [drp] | Freq: Two times a day (BID) | OPHTHALMIC | Status: DC
  Administered 2022-06-01 – 2022-06-02 (×3): 1 [drp] via OPHTHALMIC
  Filled 2022-05-31: qty 10

## 2022-05-31 MED ORDER — SODIUM CHLORIDE 0.9 % IV SOLN
1.0000 g | INTRAVENOUS | Status: DC
  Administered 2022-06-01: 1 g via INTRAVENOUS
  Filled 2022-05-31: qty 10

## 2022-05-31 MED ORDER — ROSUVASTATIN CALCIUM 5 MG PO TABS
5.0000 mg | ORAL_TABLET | ORAL | Status: DC
  Administered 2022-06-01: 5 mg via ORAL
  Filled 2022-05-31: qty 1

## 2022-05-31 MED ORDER — POLYETHYLENE GLYCOL 3350 17 G PO PACK: 17.0000 g | PACK | Freq: Every day | ORAL | Status: DC | PRN

## 2022-05-31 MED ORDER — ACETAMINOPHEN 325 MG PO TABS
650.0000 mg | ORAL_TABLET | Freq: Once | ORAL | Status: AC
  Administered 2022-05-31: 650 mg via ORAL
  Filled 2022-05-31: qty 2

## 2022-05-31 MED ORDER — ONDANSETRON HCL 4 MG/2ML IJ SOLN
4.0000 mg | Freq: Once | INTRAMUSCULAR | Status: AC
  Administered 2022-05-31: 4 mg via INTRAVENOUS
  Filled 2022-05-31: qty 2

## 2022-05-31 MED ORDER — SODIUM CHLORIDE 0.9 % IV SOLN
1.0000 g | Freq: Once | INTRAVENOUS | Status: AC
  Administered 2022-05-31: 1 g via INTRAVENOUS
  Filled 2022-05-31: qty 10

## 2022-05-31 MED ORDER — LACTATED RINGERS IV SOLN: INTRAVENOUS | Status: DC

## 2022-05-31 MED ORDER — VANCOMYCIN HCL 125 MG PO CAPS: 125.0000 mg | ORAL_CAPSULE | Freq: Four times a day (QID) | ORAL | Status: DC

## 2022-05-31 MED ORDER — IBUPROFEN 400 MG PO TABS
400.0000 mg | ORAL_TABLET | Freq: Four times a day (QID) | ORAL | Status: DC | PRN
  Filled 2022-05-31: qty 1

## 2022-05-31 NOTE — Plan of Care (Signed)
  Problem: Education: Goal: Knowledge of General Education information will improve Description Including pain rating scale, medication(s)/side effects and non-pharmacologic comfort measures Outcome: Progressing   

## 2022-05-31 NOTE — ED Notes (Signed)
Pt given apple juice, crackers and cheese, per Dr. Sherry Ruffing.

## 2022-05-31 NOTE — Plan of Care (Signed)
Victoria Harris, is a 72 y.o. female, DOB - [birth year], [id number]  72 year old female with history of diverticulosis, dyslipidemia, chronic back pain with L-spine disc disease, stroke in the past, who recently finished her oral antibiotic treatment for C. difficile, she was being evaluated for back surgery by the neurosurgeon for back pain and discomfort, when she showed up to the office she was not feeling well and was found to have low-grade fever and also complained of some nausea and vomiting.  Went to the droppage ER where she was diagnosed with cystitis/early pyelonephritis.  And the hospitalist team was requested to admit the patient.  She has been placed on IV Rocephin since she has finished her C. difficile treatment with oral vancomycin few days ago I will continue this as her risk of getting C. difficile again is quite high.  CT abdomen pelvis did not show any acute colitis or diverticulitis.  Vitals:   05/31/22 1000 05/31/22 1030 05/31/22 1206 05/31/22 1300  BP: 108/74 124/77  108/84  Pulse: 88 88  80  Resp:  11  18  Temp:   98.2 F (36.8 C)   TempSrc:   Oral   SpO2: 93% 93%  97%        Data Review   Micro Results Recent Results (from the past 240 hour(s))  Resp panel by RT-PCR (RSV, Flu A&B, Covid) Anterior Nasal Swab     Status: None   Collection Time: 05/31/22 10:18 AM   Specimen: Anterior Nasal Swab  Result Value Ref Range Status   SARS Coronavirus 2 by RT PCR NEGATIVE NEGATIVE Final    Comment: (NOTE) SARS-CoV-2 target nucleic acids are NOT DETECTED.  The SARS-CoV-2 RNA is generally detectable in upper respiratory specimens during the acute phase of infection. The lowest concentration of SARS-CoV-2 viral copies this assay can detect is 138 copies/mL. A negative result does not preclude SARS-Cov-2 infection and should not be used as the sole basis for treatment or other  patient management decisions. A negative result may occur with  improper specimen collection/handling, submission of specimen other than nasopharyngeal swab, presence of viral mutation(s) within the areas targeted by this assay, and inadequate number of viral copies(<138 copies/mL). A negative result must be combined with clinical observations, patient history, and epidemiological information. The expected result is Negative.  Fact Sheet for Patients:  EntrepreneurPulse.com.au  Fact Sheet for Healthcare Providers:  IncredibleEmployment.be  This test is no t yet approved or cleared by the [place] FDA and  has been authorized for detection and/or diagnosis of SARS-CoV-2 by FDA under an Emergency Use Authorization (EUA). This EUA will remain  in effect (meaning this test can be used) for the duration of the COVID-19 declaration under Section 564(b)(1) of the Act, 21 U.S.C.section 360bbb-3(b)(1), unless the authorization is terminated  or revoked sooner.       Influenza A by PCR NEGATIVE NEGATIVE Final   Influenza B by PCR NEGATIVE NEGATIVE Final    Comment: (NOTE) The Xpert Xpress SARS-CoV-2/FLU/RSV plus assay is intended as an aid in the diagnosis of influenza from Nasopharyngeal swab specimens and should not be used as a sole basis for treatment. Nasal washings and aspirates are unacceptable for Xpert Xpress SARS-CoV-2/FLU/RSV testing.  Fact Sheet for Patients: EntrepreneurPulse.com.au  Fact Sheet for Healthcare Providers: IncredibleEmployment.be  This test is not yet approved or cleared by the [place] FDA and has been authorized for detection and/or diagnosis of SARS-CoV-2 by FDA under an Emergency Use Authorization (EUA). This  EUA will remain in effect (meaning this test can be used) for the duration of the COVID-19 declaration under Section 564(b)(1) of the Act, 21 U.S.C. section  360bbb-3(b)(1), unless the authorization is terminated or revoked.     Resp Syncytial Virus by PCR NEGATIVE NEGATIVE Final    Comment: (NOTE) Fact Sheet for Patients: EntrepreneurPulse.com.au  Fact Sheet for Healthcare Providers: IncredibleEmployment.be  This test is not yet approved or cleared by the [place] FDA and has been authorized for detection and/or diagnosis of SARS-CoV-2 by FDA under an Emergency Use Authorization (EUA). This EUA will remain in effect (meaning this test can be used) for the duration of the COVID-19 declaration under Section 564(b)(1) of the Act, 21 U.S.C. section 360bbb-3(b)(1), unless the authorization is terminated or revoked.  Performed at [facility], [address] [place], [place] [address]     Radiology Reports CT ABDOMEN PELVIS W CONTRAST  Result Date: 05/31/2022 CLINICAL DATA:  Abdominal pain, acute, nonlocalized Abdominal pain, chills, hypoxia, recent C. difficile, diarrhea again. EXAM: CT ABDOMEN AND PELVIS WITH CONTRAST TECHNIQUE: Multidetector CT imaging of the abdomen and pelvis was performed using the standard protocol following bolus administration of intravenous contrast. RADIATION DOSE REDUCTION: This exam was performed according to the departmental dose-optimization program which includes automated exposure control, adjustment of the mA and/or kV according to patient size and/or use of iterative reconstruction technique. CONTRAST:  30m OMNIPAQUE IOHEXOL 300 MG/ML  SOLN COMPARISON:  None Available. FINDINGS: Lower chest: No acute abnormality. Hepatobiliary: No focal liver abnormality is seen. Prior cholecystectomy with expected mild prominence of the biliary system. Pancreas: Unremarkable. No pancreatic ductal dilatation or surrounding inflammatory changes. Spleen: Normal in size without focal abnormality. Adrenals/Urinary Tract: Adrenal glands are unremarkable. No hydronephrosis or  nephrolithiasis. The bladder is mildly distended. There is irregular wall thickening at the base of the bladder. Stomach/Bowel: The stomach is within normal limits. There is no evidence of bowel obstruction.The appendix is normal. Colonic diverticulosis. No evidence of acute diverticulitis. Vascular/Lymphatic: Scattered atherosclerosis. No AAA. No lymphadenopathy. Collateral vessels with splenorenal shunt noted. Reproductive: Prior hysterectomy. Other: No abdominal wall hernia or abnormality. No abdominopelvic ascites. Musculoskeletal: No acute osseous abnormality min. No suspicious osseous lesion. Multilevel degenerative changes of the spine. IMPRESSION: Irregular bladder wall thickening at the bladder base, correlate with urinalysis for possible cystitis. Colonic diverticulosis without evidence of acute diverticulitis. Normal appendix. Electronically Signed   By: JMaurine SimmeringM.D.   On: 05/31/2022 11:44   DG Chest Portable 1 View  Result Date: 05/31/2022 CLINICAL DATA:  Chills, nausea, diarrhea, abdominal pain, fatigue and hypoxia. EXAM: PORTABLE CHEST 1 VIEW COMPARISON:  09/10/2021 FINDINGS: Heart size and mediastinal contours are unremarkable. No pleural effusion or edema. No airspace opacities identified. Visualized osseous structures are unremarkable. IMPRESSION: No active disease. Electronically Signed   By: TKerby MoorsM.D.   On: 05/31/2022 11:17   MR LUMBAR SPINE WO CONTRAST  Result Date: 05/06/2022 CLINICAL DATA:  Back pain with symptoms persisting over 6 weeks of treatment. History of bulging disc and chronic pain. EXAM: MRI LUMBAR SPINE WITHOUT CONTRAST TECHNIQUE: Multiplanar, multisequence MR imaging of the lumbar spine was performed. No intravenous contrast was administered. COMPARISON:  05/23/2004 FINDINGS: Segmentation:  5 lumbar type vertebrae Alignment: L4-5 anterolisthesis measuring 5 mm. Mild dextrocurvature. Vertebrae:  No fracture, evidence of discitis, or bone lesion. Conus  medullaris and cauda equina: Conus extends to the L1 level. Conus and cauda equina appear normal. Paraspinal and other soft tissues: Negative for perispinal  mass or inflammation Disc levels: L2-L3: Disc narrowing and bulging with endplate degeneration. Negative facets L3-L4: Disc narrowing and bulging. Negative facets. No neural compression L4-L5: Advanced facet osteoarthritis with gaping fluid-filled joints and ligamentum flavum thickening. The disc is narrowed and bulging. Advanced spinal stenosis with bilateral L5 compression at the subarticular recesses. Bilateral foraminal narrowing is mild. L5-S1:Disc narrowing and bulging with right paracentral herniation impinging on the descending right S1 nerve root. Negative facets. IMPRESSION: 1. 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 2. 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 3. L4-5 advanced facet degeneration with anterolisthesis and compressive spinal/subarticular recess stenosis. Electronically Signed   By: Jorje Guild M.D.   On: 05/06/2022 11:47    CBC Recent Labs  Lab 05/31/22 0829  WBC 10.4  HGB 15.3*  HCT 43.8  PLT 184  MCV 91.8  MCH 32.1  MCHC 34.9  RDW 12.5    Chemistries  Recent Labs  Lab 05/31/22 0829  NA 137  K 3.7  CL 97*  CO2 25  GLUCOSE 220*  BUN 20  CREATININE 0.78  CALCIUM 10.1     Signature  Lala Lund M.D on 05/31/2022 at 3:01 PM   -  To page go to [email or web address]

## 2022-05-31 NOTE — ED Triage Notes (Signed)
Pt had cdiff 3 weeks ago, treated for that. Today was due for lumbar micro. Surgery this am but has been nauseated last 4 days, no bm in 3 days, at surgical center she had 5 occure.of diarrhea, sent here.

## 2022-05-31 NOTE — ED Notes (Signed)
Called lab to add on urine culture ?

## 2022-05-31 NOTE — Progress Notes (Signed)
New Admission Note:   Arrival Method: Stretcher  Mental Orientation:  Telemetry: Box 20  Assessment: Completed Skin: intact  IV: right Ac  Pain: 0/10  Tubes: Safety Measures: Safety Fall Prevention Plan has been given, discussed and signed Admission: Completed [facility] Orientation: Patient has been orientated to the room, unit and staff.  Family: Husband, son daughter in law   Orders have been reviewed and implemented. Will continue to monitor the patient. Call light has been placed within reach and bed alarm has been activated.   Harshan Kearley RN [facility] Phone: [phone]

## 2022-05-31 NOTE — ED Provider Notes (Signed)
[facility] EMERGENCY DEPT Provider Note   CSN: [id number] Arrival date & time: 05/31/22  8588     History  No chief complaint on file.   Victoria Harris is a 72 y.o. female.  The history is provided by the patient and medical records. No language interpreter was used.  Abdominal Pain Pain location:  Generalized Pain quality: aching   Pain severity:  Moderate Onset quality:  Gradual Timing:  Intermittent Progression:  Waxing and waning Chronicity:  New Relieved by:  Nothing Worsened by:  Nothing Ineffective treatments:  None tried Associated symptoms: chills, constipation, diarrhea, dysuria (darker and foul appearing), fatigue and nausea   Associated symptoms: no chest pain, no cough, no fever, no shortness of breath and no vomiting        Home Medications Prior to Admission medications   Medication Sig Start Date End Date Taking? Authorizing Provider  meloxicam (MOBIC) 7.5 MG tablet Take 1 tablet (7.5 mg total) by mouth daily. As needed 04/09/22   Einar Pheasant, MD  aspirin 81 MG tablet Take 81 mg by mouth daily.    [provider]  Cholecalciferol 25 MCG (1000 UT) tablet Take 1,000 Units by mouth daily.     [provider]  diclofenac (VOLTAREN) 75 MG EC tablet Take 75 mg by mouth 2 (two) times daily.    [provider]  dorzolamide-timolol (COSOPT) 22.3-6.8 MG/ML ophthalmic solution Place 1 drop into both eyes 2 (two) times daily. 07/19/17   [provider]  glucose blood (ONETOUCH VERIO) test strip USE AS INSTRUCTED TO CHECK BLOOD SUGARS DAILY. DX E11.9 02/23/22   Einar Pheasant, MD  HYDROcodone-acetaminophen (NORCO/VICODIN) 5-325 MG tablet One tablet q 6-8 hours prn increased pain. 05/25/22   Burnard Hawthorne, FNP  latanoprost (XALATAN) 0.005 % ophthalmic solution 1 drop at bedtime. 09/25/21   [provider]  Multiple Vitamins-Minerals (MULTIVITAMIN ADULT PO) Take by mouth daily.    [provider]  ondansetron (ZOFRAN-ODT) 4 MG disintegrating tablet Take 1 tablet (4 mg total) by mouth every 8 (eight) hours as needed. 05/06/22   Nena Polio, MD  Probiotic Product (PROBIOTIC DAILY) CAPS Take 1 capsule by mouth daily.    [provider]  rosuvastatin (CRESTOR) 5 MG tablet Take 1 tablet (5 mg total) by mouth daily. 03/28/22   Einar Pheasant, MD  traMADol (ULTRAM) 50 MG tablet Take 50 mg by mouth 4 (four) times daily.    [provider]  vancomycin (VANCOCIN) 125 MG capsule Take 1 capsule (125 mg total) by mouth 4 (four) times daily. 05/06/22   Nena Polio, MD      Allergies    Glucophage xr [metformin], Jardiance [empagliflozin], and Tylenol [acetaminophen]    Review of Systems   Review of Systems  Constitutional:  Positive for chills and fatigue. Negative for diaphoresis and fever.  HENT:  Negative for congestion.   Eyes:  Negative for visual disturbance.  Respiratory:  Negative for cough, chest tightness and shortness of breath.   Cardiovascular:  Negative for chest pain.  Gastrointestinal:  Positive for abdominal pain, constipation, diarrhea and nausea. Negative for vomiting.  Genitourinary:  Positive for decreased urine volume, dysuria (darker and foul appearing) and flank pain. Negative for frequency.  Musculoskeletal:  Negative for back pain.  Skin:  Negative for rash.  Neurological:  Negative for headaches.  Psychiatric/Behavioral:  Negative for agitation and confusion.     Physical Exam Updated Vital Signs BP 116/78   Pulse 93  Temp 98.4 F (36.9 C) (Oral)   Resp 18   SpO2 96%  Physical Exam Vitals and nursing note reviewed.  Constitutional:      General: She is not in acute distress.    Appearance: She is well-developed. She is not ill-appearing, toxic-appearing or diaphoretic.  HENT:     Head: Normocephalic and atraumatic.     Mouth/Throat:     Mouth: Mucous membranes are dry.  Eyes:     Extraocular Movements:  Extraocular movements intact.     Conjunctiva/sclera: Conjunctivae normal.     Pupils: Pupils are equal, round, and reactive to light.  Cardiovascular:     Rate and Rhythm: Regular rhythm. Tachycardia present.     Heart sounds: No murmur heard. Pulmonary:     Effort: Pulmonary effort is normal.     Breath sounds: Normal breath sounds. No rhonchi or rales.  Chest:     Chest wall: No tenderness.  Abdominal:     General: Abdomen is flat.     Palpations: Abdomen is soft.     Tenderness: There is abdominal tenderness. There is no guarding or rebound.  Musculoskeletal:        General: No swelling or tenderness.     Cervical back: Neck supple. No tenderness.     Right lower leg: No edema.     Left lower leg: No edema.  Skin:    General: Skin is warm and dry.     Capillary Refill: Capillary refill takes less than 2 seconds.     Coloration: Skin is not pale.     Findings: No erythema or rash.  Neurological:     General: No focal deficit present.     Mental Status: She is alert.  Psychiatric:        Mood and Affect: Mood normal.     ED Results / Procedures / Treatments   Labs (all labs ordered are listed, but only abnormal results are displayed) Labs Reviewed  CBC - Abnormal; Notable for the following components:      Result Value   Hemoglobin 15.3 (*)    All other components within normal limits  BASIC METABOLIC PANEL - Abnormal; Notable for the following components:   Chloride 97 (*)    Glucose, Bld 220 (*)    All other components within normal limits  URINALYSIS, ROUTINE W REFLEX MICROSCOPIC - Abnormal; Notable for the following components:   APPearance CLOUDY (*)    Hgb urine dipstick MODERATE (*)    Ketones, ur 15 (*)    Protein, ur 100 (*)    Leukocytes,Ua LARGE (*)    WBC, UA >50 (*)    Bacteria, UA MANY (*)    Non Squamous Epithelial 0-5 (*)    All other components within normal limits  CBG MONITORING, ED - Abnormal; Notable for the following components:    Glucose-Capillary 213 (*)    All other components within normal limits  RESP PANEL BY RT-PCR (RSV, FLU A&B, COVID)  [id number]  URINE CULTURE    EKG EKG Interpretation  Date/Time:  Thursday May 31 2022 10:25:57 EST Ventricular Rate:  87 PR Interval:  151 QRS Duration: 92 QT Interval:  376 QTC Calculation: 453 R Axis:   -14 Text Interpretation: Sinus rhythm when comapred to prior, similar appearance No STEMI Confirmed by Antony Blackbird [phone]) on 05/31/2022 10:50:15 AM  Radiology CT ABDOMEN PELVIS W CONTRAST  Result Date: 05/31/2022 CLINICAL DATA:  Abdominal pain, acute, nonlocalized Abdominal pain, chills, hypoxia, recent C.  difficile, diarrhea again. EXAM: CT ABDOMEN AND PELVIS WITH CONTRAST TECHNIQUE: Multidetector CT imaging of the abdomen and pelvis was performed using the standard protocol following bolus administration of intravenous contrast. RADIATION DOSE REDUCTION: This exam was performed according to the departmental dose-optimization program which includes automated exposure control, adjustment of the mA and/or kV according to patient size and/or use of iterative reconstruction technique. CONTRAST:  31m OMNIPAQUE IOHEXOL 300 MG/ML  SOLN COMPARISON:  None Available. FINDINGS: Lower chest: No acute abnormality. Hepatobiliary: No focal liver abnormality is seen. Prior cholecystectomy with expected mild prominence of the biliary system. Pancreas: Unremarkable. No pancreatic ductal dilatation or surrounding inflammatory changes. Spleen: Normal in size without focal abnormality. Adrenals/Urinary Tract: Adrenal glands are unremarkable. No hydronephrosis or nephrolithiasis. The bladder is mildly distended. There is irregular wall thickening at the base of the bladder. Stomach/Bowel: The stomach is within normal limits. There is no evidence of bowel obstruction.The appendix is normal. Colonic diverticulosis. No evidence of acute diverticulitis. Vascular/Lymphatic: Scattered atherosclerosis. No  AAA. No lymphadenopathy. Collateral vessels with splenorenal shunt noted. Reproductive: Prior hysterectomy. Other: No abdominal wall hernia or abnormality. No abdominopelvic ascites. Musculoskeletal: No acute osseous abnormality min. No suspicious osseous lesion. Multilevel degenerative changes of the spine. IMPRESSION: Irregular bladder wall thickening at the bladder base, correlate with urinalysis for possible cystitis. Colonic diverticulosis without evidence of acute diverticulitis. Normal appendix. Electronically Signed   By: JMaurine SimmeringM.D.   On: 05/31/2022 11:44   DG Chest Portable 1 View  Result Date: 05/31/2022 CLINICAL DATA:  Chills, nausea, diarrhea, abdominal pain, fatigue and hypoxia. EXAM: PORTABLE CHEST 1 VIEW COMPARISON:  09/10/2021 FINDINGS: Heart size and mediastinal contours are unremarkable. No pleural effusion or edema. No airspace opacities identified. Visualized osseous structures are unremarkable. IMPRESSION: No active disease. Electronically Signed   By: TKerby MoorsM.D.   On: 05/31/2022 11:17    Procedures Procedures    CRITICAL CARE Performed by: CGwenyth AllegraTegeler Total critical care time: 35 minutes Critical care time was exclusive of separately billable procedures and treating other patients. Critical care was necessary to treat or prevent imminent or life-threatening deterioration. Critical care was time spent personally by me on the following activities: development of treatment plan with patient and/or surrogate as well as nursing, discussions with consultants, evaluation of patient's response to treatment, examination of patient, obtaining history from patient or surrogate, ordering and performing treatments and interventions, ordering and review of laboratory studies, ordering and review of radiographic studies, pulse oximetry and re-evaluation of patient's condition.   Medications Ordered in ED Medications  cefTRIAXone (ROCEPHIN) 1 g in sodium chloride 0.9  % 100 mL IVPB (has no administration in time range)  lactated ringers infusion ( Intravenous New Bag/Given 05/31/22 1534)  vancomycin (VANCOCIN) capsule 125 mg (has no administration in time range)  sodium chloride 0.9 % bolus 1,000 mL (0 mLs Intravenous Stopped 05/31/22 1322)  sodium chloride 0.9 % bolus 1,000 mL (0 mLs Intravenous Stopped 05/31/22 1206)  ondansetron (ZOFRAN) injection 4 mg (4 mg Intravenous Given 05/31/22 1016)  fentaNYL (SUBLIMAZE) injection 50 mcg (50 mcg Intravenous Given 05/31/22 1016)  iohexol (OMNIPAQUE) 300 MG/ML solution 100 mL (80 mLs Intravenous Contrast Given 05/31/22 1048)  fentaNYL (SUBLIMAZE) injection 50 mcg (50 mcg Intravenous Given 05/31/22 1321)  sodium chloride 0.9 % bolus 1,000 mL (0 mLs Intravenous Stopped 05/31/22 1522)  ondansetron (ZOFRAN) injection 4 mg (4 mg Intravenous Given 05/31/22 1447)  cefTRIAXone (ROCEPHIN) 1 g in sodium chloride 0.9 % 100 mL IVPB (0  g Intravenous Stopped 05/31/22 1522)    ED Course/ Medical Decision Making/ A&P                           Medical Decision Making Amount and/or Complexity of Data Reviewed Labs: ordered. Radiology: ordered.  Risk Prescription drug management. Decision regarding hospitalization.    Victoria Harris is a 72 y.o. female with a past medical history significant previous stroke, diabetes, diverticulosis, hypercholesterolemia, previous cholecystectomy and hysterectomy, and recent infection with C. difficile status post antibiotics who was post to have surgery on her back today presents with chills, nausea, abdominal pain, flank pain bilaterally, darkened urine, and diarrhea.  Patient reports that several weeks ago she was diagnosed with C. difficile and had numerous doses of diarrhea.  She reports she completed her prescription of oral vancomycin a week or 2 ago and was doing better however over the last several days she is felt more fatigued and nauseous and urine has been darker.  She reports that she had not  had a bowel movement several days but then this morning when she showed up to the surgical center for back surgery, she started having diarrhea.  She had approximately 5 episodes of diarrhea that was watery.  It did not seem as "oily and greasy" as the C. difficile diarrhea and patient is unsure if something else is going on.  She reports no fevers but does have some reported chills.  She denies any chest pain shortness breath or cough.  She does report some lower abdominal discomfort that is moderate in severity.  She denies any trauma.  Denies rashes.  On my exam, lungs clear and chest nontender.  Abdomen has very mild tenderness on exam.  Bowel sounds were very active.  Flank slightly tender but CVA area is nontender.  No rash.  Good pulses in extremities.  Patient resting.  Given patient's darkened urine, abdominal pain, flank pain, nausea, and fatigue I do feel it is reasonable for [place] to get labs, urine, and due to the recent infection with C. difficile and abdominal pain with nausea, get CT imaging to rule out complication.  Workup began to return.  CT scan did not show evidence of diverticulitis but did show evidence of bladder wall thickening concerning for cystitis.  Given the patient's pain going to her flanks with nausea and fatigue I am concerned she could have early pyelonephritis.  Patient continued to have pain requiring pain medicine and was feeling dehydrated and got some fluids.  Initial blood pressure was on the softer side but not hypotensive.  Urinalysis does show evidence of possible infection given the darkened urine and suprapubic/flank pains symptoms.  Culture was sent.  Patient's other labs showed normal kidney function and no evidence of COVID flu RSV.  CBC does not show leukocytosis however patient continued to have worsening symptoms.  She continued to have loose stools and was having more nausea.  She did not pass a p.o. challenge as this made her feel much worse.  Clinically  I am concerned about her going home to take oral antibiotics for suspected early pyelonephritis given her continued abdominal symptoms nausea and ill appearance.  She is now more tachycardic about 110 and heart rate.  Due to her worsening appearance, we will give antibiotics for suspected pyelonephritis and give her more pain medicine nausea medicine and fluids.  Will call for admission for early [place] and will discuss with medicine if they feel  that repeat testing for C. difficile is worth doing as it was just several weeks ago that it was discovered.         Final Clinical Impression(s) / ED Diagnoses Final diagnoses:  Pyelonephritis  Nausea  Diarrhea, unspecified type  Generalized abdominal pain    Rx / DC Orders ED Discharge Orders     None      Clinical Impression: 1. Pyelonephritis   2. Nausea   3. Diarrhea, unspecified type   4. Generalized abdominal pain     Disposition: Admit  This note was prepared with assistance of Dragon voice recognition software. Occasional wrong-word or sound-a-like substitutions may have occurred due to the inherent limitations of voice recognition software.     Abdur Hoglund, Gwenyth Allegra, MD 05/31/22 1600

## 2022-05-31 NOTE — H&P (Signed)
History and Physical   Victoria Harris DTO:[id number] DOB: Sep 27, 1950 DOA: 05/31/2022  PCP: Einar Pheasant, MD   Patient coming from: Home/surgery center  Chief Complaint: Abdominal pain  HPI: Victoria Harris is a 72 y.o. female with medical history significant of diabetes, chronic pain, hyperlipidemia, stroke, glaucoma, diverticulosis presenting with ongoing abdominal pain and associated symptoms.  Patient reports recently diagnosed with C. difficile colitis a few weeks ago and completed her course of oral vancomycin about a week ago.  Was doing fine after that but 3 days ago began to develop abdominal pain with associated flank pain and dark urine.  Also noted some chills and diarrhea but different from previous.  Diarrhea did not occur until today when she went to surgical center for planned back surgery and she had multiple loose stools.  Due to the surgery was aborted and she was sent to the ED for further evaluation.  She denies fevers, chest pain, shortness of breath, constipation, nausea, vomiting.  ED Course: Vital signs in the ED significant for fever to 1.5, heart rate in the 80s to 100s, blood pressure in the 983 systolic, was at 1 point put on 2 L but this appears to be for comfort.  Lab workup included BMP with chloride 97, glucose 220.  CBC without leukocytosis but did show hemoglobin of 15.3.  Respiratory for flu COVID RSV negative.  Urinalysis with hemoglobin, ketones, protein, leukocytes, bacteria.  Urine culture pending.  Chest x-ray showed no acute normality.  CT Abdo pelvis showed irregular bladder wall thickening consistent with cystitis.  There is evidence of diverticulosis without diverticulitis.  Patient see Tylenol, fentanyl, vancomycin p.o., ceftriaxone, Zofran, and 3 L of fluid in the ED.  Review of Systems: As per HPI otherwise all other systems reviewed and are negative.  Past Medical History:  Diagnosis Date   Colon polyps    hyperplastic   Diabetes  mellitus without complication ([place])    Diverticulosis    Hx of adenomatous colonic polyps    tubular adenoma   Hypercholesterolemia    Serrated adenoma of colon    Stroke (cerebrum) ([place])    Thrombocytopenia ([place])    Uterus descensus     Past Surgical History:  Procedure Laterality Date   ABDOMINAL HYSTERECTOMY  2009   CHOLECYSTECTOMY  2007   COLONOSCOPY N/A 04/05/2015   Procedure: COLONOSCOPY;  Surgeon: Lollie Sails, MD;  Location: [facility] ENDOSCOPY;  Service: Endoscopy;  Laterality: N/A;   COLONOSCOPY WITH PROPOFOL N/A 10/09/2016   Procedure: COLONOSCOPY WITH PROPOFOL;  Surgeon: Lollie Sails, MD;  Location: [facility] ENDOSCOPY;  Service: Endoscopy;  Laterality: N/A;   COLONOSCOPY WITH PROPOFOL N/A 12/02/2019   Procedure: COLONOSCOPY WITH PROPOFOL;  Surgeon: Toledo, Benay Pike, MD;  Location: [facility] ENDOSCOPY;  Service: Gastroenterology;  Laterality: N/A;   DILATION AND CURETTAGE OF UTERUS  2001 and 2006   FOOT SURGERY  2007   TONSILLECTOMY  1960   TUBAL LIGATION     vocal cord cyst removed      Social History  reports that she has never smoked. She has never used smokeless tobacco. She reports that she does not drink alcohol and does not use drugs.  Allergies  Allergen Reactions   Glucophage Xr [Metformin]     diarrhea   Jardiance [Empagliflozin]     Seen 09/07/21 on this day Stopped jardiance due to pt easily dehydrates at baseline and felt like this was making this worse and causing to feel like she was going to  pass out or pass out    Tylenol [Acetaminophen] Hives    Family History  Problem Relation Age of Onset   Colon cancer Mother    Diabetes Mother    Kidney disease Mother    Heart disease Mother    Prostate cancer Father    Diabetes Father    Glaucoma Father    Uterine cancer Maternal Aunt    Breast cancer Paternal Aunt        x2   Stroke Paternal Grandmother    Stroke Paternal Grandfather    Diabetes Brother    Diabetes Brother    Diabetes Other    Reviewed on admission  Prior to Admission medications   Medication Sig Start Date End Date Taking? Authorizing Provider  aspirin 81 MG tablet Take 81 mg by mouth daily.   Yes [provider]  Cholecalciferol 25 MCG (1000 UT) tablet Take 1,000 Units by mouth daily.    Yes [provider]  dorzolamide-timolol (COSOPT) 22.3-6.8 MG/ML ophthalmic solution Place 1 drop into both eyes 2 (two) times daily. 07/19/17  Yes [provider]  HYDROcodone-acetaminophen (NORCO/VICODIN) 5-325 MG tablet One tablet q 6-8 hours prn increased pain. 05/25/22  Yes Arnett, Yvetta Coder, FNP  latanoprost (XALATAN) 0.005 % ophthalmic solution 1 drop at bedtime. 09/25/21  Yes [provider]  meloxicam (MOBIC) 7.5 MG tablet Take 1 tablet (7.5 mg total) by mouth daily. As needed Patient not taking: Reported on 05/31/2022 04/09/22   Einar Pheasant, MD  Multiple Vitamins-Minerals (MULTIVITAMIN ADULT PO) Take by mouth daily.   Yes [provider]  ondansetron (ZOFRAN-ODT) 4 MG disintegrating tablet Take 1 tablet (4 mg total) by mouth every 8 (eight) hours as needed. 05/06/22  Yes Nena Polio, MD  Probiotic Product (PROBIOTIC DAILY) CAPS Take 1 capsule by mouth daily.   Yes [provider]  rosuvastatin (CRESTOR) 5 MG tablet Take 1 tablet (5 mg total) by mouth daily. 03/28/22  Yes Einar Pheasant, MD  traMADol (ULTRAM) 50 MG tablet Take 50 mg by mouth 4 (four) times daily.   Yes [provider]  vancomycin (VANCOCIN) 125 MG capsule Take 1 capsule (125 mg total) by mouth 4 (four) times daily. 05/06/22  Yes Nena Polio, MD  glucose blood (ONETOUCH VERIO) test strip USE AS INSTRUCTED TO CHECK BLOOD SUGARS DAILY. DX E11.9 02/23/22   Einar Pheasant, MD    Physical Exam: Vitals:   05/31/22 1206 05/31/22 1300 05/31/22 1717 05/31/22 1811  BP:  108/84  (!) 111/48  Pulse:  80  98  Resp:  18  20  Temp: 98.2 F (36.8 C)  (!) 101.5 F (38.6 C) 100.2 F (37.9 C)   TempSrc: Oral  Oral Oral  SpO2:  97%  98%    Physical Exam Constitutional:      General: She is not in acute distress.    Appearance: Normal appearance.  HENT:     Head: Normocephalic and atraumatic.     Mouth/Throat:     Mouth: Mucous membranes are moist.     Pharynx: Oropharynx is clear.  Eyes:     Extraocular Movements: Extraocular movements intact.     Pupils: Pupils are equal, round, and reactive to light.  Cardiovascular:     Rate and Rhythm: Normal rate and regular rhythm.     Pulses: Normal pulses.     Heart sounds: Normal heart sounds.  Pulmonary:     Effort: Pulmonary effort is normal. No respiratory distress.  Breath sounds: Normal breath sounds.  Abdominal:     General: Bowel sounds are normal. There is no distension.     Palpations: Abdomen is soft.     Tenderness: There is abdominal tenderness. There is right CVA tenderness.  Musculoskeletal:        General: No swelling or deformity.  Skin:    General: Skin is warm and dry.  Neurological:     General: No focal deficit present.     Mental Status: Mental status is at baseline.    Labs on Admission: I have personally reviewed following labs and imaging studies  CBC: Recent Labs  Lab 05/31/22 0829  WBC 10.4  HGB 15.3*  HCT 43.8  MCV 91.8  PLT 244    Basic Metabolic Panel: Recent Labs  Lab 05/31/22 0829  NA 137  K 3.7  CL 97*  CO2 25  GLUCOSE 220*  BUN 20  CREATININE 0.78  CALCIUM 10.1    GFR: CrCl cannot be calculated (Unknown ideal weight.).  Liver Function Tests: No results for input(s): "AST", "ALT", "ALKPHOS", "BILITOT", "PROT", "ALBUMIN" in the last 168 hours.  Urine analysis:    Component Value Date/Time   COLORURINE YELLOW 05/31/2022 0829   APPEARANCEUR CLOUDY (A) 05/31/2022 0829   APPEARANCEUR Cloudy (A) 09/22/2020 1418   LABSPEC 1.018 05/31/2022 0829   PHURINE 5.5 05/31/2022 0829   GLUCOSEU NEGATIVE 05/31/2022 0829   HGBUR MODERATE (A) 05/31/2022 0829   BILIRUBINUR  NEGATIVE 05/31/2022 0829   BILIRUBINUR Negative 01/17/2021 1050   BILIRUBINUR Negative 09/22/2020 1418   KETONESUR 15 (A) 05/31/2022 0829   PROTEINUR 100 (A) 05/31/2022 0829   UROBILINOGEN 0.2 01/17/2021 1050   NITRITE NEGATIVE 05/31/2022 0829   LEUKOCYTESUR LARGE (A) 05/31/2022 0829    Radiological Exams on Admission: CT ABDOMEN PELVIS W CONTRAST  Result Date: 05/31/2022 CLINICAL DATA:  Abdominal pain, acute, nonlocalized Abdominal pain, chills, hypoxia, recent C. difficile, diarrhea again. EXAM: CT ABDOMEN AND PELVIS WITH CONTRAST TECHNIQUE: Multidetector CT imaging of the abdomen and pelvis was performed using the standard protocol following bolus administration of intravenous contrast. RADIATION DOSE REDUCTION: This exam was performed according to the departmental dose-optimization program which includes automated exposure control, adjustment of the mA and/or kV according to patient size and/or use of iterative reconstruction technique. CONTRAST:  67m OMNIPAQUE IOHEXOL 300 MG/ML  SOLN COMPARISON:  None Available. FINDINGS: Lower chest: No acute abnormality. Hepatobiliary: No focal liver abnormality is seen. Prior cholecystectomy with expected mild prominence of the biliary system. Pancreas: Unremarkable. No pancreatic ductal dilatation or surrounding inflammatory changes. Spleen: Normal in size without focal abnormality. Adrenals/Urinary Tract: Adrenal glands are unremarkable. No hydronephrosis or nephrolithiasis. The bladder is mildly distended. There is irregular wall thickening at the base of the bladder. Stomach/Bowel: The stomach is within normal limits. There is no evidence of bowel obstruction.The appendix is normal. Colonic diverticulosis. No evidence of acute diverticulitis. Vascular/Lymphatic: Scattered atherosclerosis. No AAA. No lymphadenopathy. Collateral vessels with splenorenal shunt noted. Reproductive: Prior hysterectomy. Other: No abdominal wall hernia or abnormality. No  abdominopelvic ascites. Musculoskeletal: No acute osseous abnormality min. No suspicious osseous lesion. Multilevel degenerative changes of the spine. IMPRESSION: Irregular bladder wall thickening at the bladder base, correlate with urinalysis for possible cystitis. Colonic diverticulosis without evidence of acute diverticulitis. Normal appendix. Electronically Signed   By: JMaurine SimmeringM.D.   On: 05/31/2022 11:44   DG Chest Portable 1 View  Result Date: 05/31/2022 CLINICAL DATA:  Chills, nausea, diarrhea, abdominal pain, fatigue and hypoxia. EXAM:  PORTABLE CHEST 1 VIEW COMPARISON:  09/10/2021 FINDINGS: Heart size and mediastinal contours are unremarkable. No pleural effusion or edema. No airspace opacities identified. Visualized osseous structures are unremarkable. IMPRESSION: No active disease. Electronically Signed   By: Kerby Moors M.D.   On: 05/31/2022 11:17    EKG: Independently reviewed.  Sinus rhythm 87 beats minute.  Nonspecific T wave flattening.  Assessment/Plan Principal Problem:   Pyelonephritis Active Problems:   Hypercholesteremia   History of CVA (cerebrovascular accident)   Type 2 diabetes mellitus with vascular disease (HCC)   Glaucoma   Pyelonephritis > Patient presenting with abdominal pain bilateral flank pain and dark urine.  Urinalysis showing hemoglobin, leukocytes, bacteria.  CT of the abdomen pelvis with irregular bladder wall thickening consistent with cystitis. > Concern for early pyelonephritis despite lack of leukocytosis or findings on CT given clinical findings with CVA tenderness. > Noted to be febrile in the ED to one 1.5.  Given IV fluids due to low normal blood pressure and started on ceftriaxone. - Monitor on telemetry - Continue with ceftriaxone - Trend fever curve and WBC - Follow-up urine cultures - Supportive care  Recent C. difficile colitis > Patient recently underwent treatment for C. difficile colitis and completed her oral vancomycin a week  ago. > Oral vancomycin has been resumed 2/2 concern for high risk of recurrent C. difficile on ceftriaxone.  Currently CT without evidence of colitis and her recent diarrhea was different than previous C. difficile. > Patient expressed concern about timing and duration of p.o. vancomycin given this has delayed her back surgery for her chronic pain already. > Will discontinue p.o. vancomycin and hold off on repeat testing.  Repeat testing is not indicated within 30 days of initial positive due to possible colonization and false positives.  As her symptoms are different from previous we will hold off on further treatment unless they are persistent then she will benefit from retesting and treatment. - Hold P.o. vancomycin  Chronic pain - Continue home as needed Norco  Glaucoma - Continue home eyedrops  Hyperlipidemia History of CVA - Continue home statin  DVT prophylaxis: Lovenox Code Status:   Full, discussed with patient Family Communication:  Updated at bedside Disposition Plan:   Patient is from:  Home  Anticipated DC to:  Home  Anticipated DC date:  1 to 3 days  Anticipated DC barriers: None  Consults called:  None Admission status:  Inpatient, telemetry  Severity of Illness: The appropriate patient status for this patient is INPATIENT. Inpatient status is judged to be reasonable and necessary in order to provide the required intensity of service to ensure the patient's safety. The patient's presenting symptoms, physical exam findings, and initial radiographic and laboratory data in the context of their chronic comorbidities is felt to place them at high risk for further clinical deterioration. Furthermore, it is not anticipated that the patient will be medically stable for discharge from the hospital within 2 midnights of admission.   * I certify that at the point of admission it is my clinical judgment that the patient will require inpatient hospital care spanning beyond 2 midnights  from the point of admission due to high intensity of service, high risk for further deterioration and high frequency of surveillance required.Marcelyn Bruins MD [facility]  How to contact the [facility] Attending or Consulting provider [facility] or covering provider during after hours [place], for this patient?   Check the care team in [facility] and  look for a) attending/consulting TR[facility] provider listed and b) the [facility] team listed Log into [email or web address] and use 's universal password to access. If you do not have the password, please contact the hospital operator. Locate the [facility] provider you are looking for under [facility] and page to a number that you can be directly reached. If you still have difficulty reaching the provider, please page the [facility] (Director on Call) for the Hospitalists listed on amion for assistance.  05/31/2022, 6:24 PM

## 2022-06-01 DIAGNOSIS — N12 Tubulo-interstitial nephritis, not specified as acute or chronic: Secondary | ICD-10-CM | POA: Diagnosis not present

## 2022-06-01 LAB — CBC
HCT: 35.4 % — ABNORMAL LOW (ref 36.0–46.0)
Hemoglobin: 12.1 g/dL (ref 12.0–15.0)
MCH: 32 pg (ref 26.0–34.0)
MCHC: 34.2 g/dL (ref 30.0–36.0)
MCV: 93.7 fL (ref 80.0–100.0)
Platelets: 146 10*3/uL — ABNORMAL LOW (ref 150–400)
RBC: 3.78 MIL/uL — ABNORMAL LOW (ref 3.87–5.11)
RDW: 12.6 % (ref 11.5–15.5)
WBC: 7.3 10*3/uL (ref 4.0–10.5)
nRBC: 0 % (ref 0.0–0.2)

## 2022-06-01 LAB — BASIC METABOLIC PANEL
Anion gap: 8 (ref 5–15)
BUN: 10 mg/dL (ref 8–23)
CO2: 25 mmol/L (ref 22–32)
Calcium: 8.6 mg/dL — ABNORMAL LOW (ref 8.9–10.3)
Chloride: 104 mmol/L (ref 98–111)
Creatinine, Ser: 0.67 mg/dL (ref 0.44–1.00)
GFR, Estimated: >60 mL/min (ref >60)
Glucose, Bld: 155 mg/dL — ABNORMAL HIGH (ref 70–99)
Potassium: 3 mmol/L — ABNORMAL LOW (ref 3.5–5.1)
Sodium: 137 mmol/L (ref 135–145)

## 2022-06-01 LAB — GLUCOSE, CAPILLARY
Glucose-Capillary: 147 mg/dL — ABNORMAL HIGH (ref 70–99)
Glucose-Capillary: 174 mg/dL — ABNORMAL HIGH (ref 70–99)
Glucose-Capillary: 131 mg/dL — ABNORMAL HIGH (ref 70–99)
Glucose-Capillary: 163 mg/dL — ABNORMAL HIGH (ref 70–99)

## 2022-06-01 MED ORDER — POTASSIUM CHLORIDE CRYS ER 10 MEQ PO TBCR
20.0000 meq | EXTENDED_RELEASE_TABLET | Freq: Once | ORAL | Status: AC
  Administered 2022-06-01: 20 meq via ORAL
  Filled 2022-06-01: qty 2

## 2022-06-01 MED ORDER — INSULIN ASPART 100 UNIT/ML IJ SOLN
0.0000 [IU] | Freq: Three times a day (TID) | INTRAMUSCULAR | Status: DC
  Administered 2022-06-01: 3 [IU] via SUBCUTANEOUS
  Administered 2022-06-01 – 2022-06-02 (×3): 2 [IU] via SUBCUTANEOUS

## 2022-06-01 MED ORDER — VANCOMYCIN HCL 125 MG PO CAPS
125.0000 mg | ORAL_CAPSULE | Freq: Two times a day (BID) | ORAL | Status: DC
  Administered 2022-06-01 – 2022-06-02 (×3): 125 mg via ORAL
  Filled 2022-06-01 (×3): qty 1

## 2022-06-01 MED ORDER — INSULIN ASPART 100 UNIT/ML IJ SOLN: 0.0000 [IU] | Freq: Every day | INTRAMUSCULAR | Status: DC

## 2022-06-01 MED ORDER — POTASSIUM CHLORIDE CRYS ER 20 MEQ PO TBCR
40.0000 meq | EXTENDED_RELEASE_TABLET | Freq: Once | ORAL | Status: AC
  Administered 2022-06-01: 20 meq via ORAL
  Filled 2022-06-01: qty 2

## 2022-06-01 MED ORDER — RISAQUAD PO CAPS
2.0000 | ORAL_CAPSULE | Freq: Every day | ORAL | Status: DC
  Administered 2022-06-01 – 2022-06-02 (×2): 2 via ORAL
  Filled 2022-06-01 (×2): qty 2

## 2022-06-01 NOTE — TOC Progression Note (Signed)
Transition of Care [facility]) - Initial/Assessment Note    Patient Details  Name: Victoria Harris MRN: [id number] Date of Birth: Feb 13, 1951  Transition of Care [facility]) CM/SW Contact:    Milinda Antis, [place] Phone Number: 06/01/2022, 2:50 PM  Clinical Narrative:                 Transition of Care Department [facility]) has reviewed patient.  Patient from home with spouse and admitted for cystitis/early pyelonephritis.  We will continue to monitor patient advancement through interdisciplinary progression rounds. If new patient transition needs arise, please place a TOC consult.          Patient Goals and CMS Choice            Expected Discharge Plan and Services                                              Prior Living Arrangements/Services                       Activities of Daily Living Home Assistive Devices/Equipment: Shower chair with back, Eyeglasses ADL Screening (condition at time of admission) Patient's cognitive ability adequate to safely complete daily activities?: Yes Is the patient deaf or have difficulty hearing?: No Does the patient have difficulty seeing, even when wearing glasses/contacts?: No Does the patient have difficulty concentrating, remembering, or making decisions?: No Patient able to express need for assistance with ADLs?: Yes Does the patient have difficulty dressing or bathing?: Yes Independently performs ADLs?: No Communication: Independent Dressing (OT): Needs assistance Grooming: Needs assistance Is this a change from baseline?: Pre-admission baseline Feeding: Independent Bathing: Needs assistance Is this a change from baseline?: Pre-admission baseline Toileting: Independent In/Out Bed: Needs assistance Is this a change from baseline?: Pre-admission baseline Walks in Home: Needs assistance Is this a change from baseline?: Pre-admission baseline Does the patient have difficulty walking or climbing stairs?: Yes Weakness  of Legs: Both Weakness of Arms/Hands: None  Permission Sought/Granted                  Emotional Assessment              Admission diagnosis:  Nausea [R11.0] Pyelonephritis [N12] Generalized abdominal pain [R10.84] Diarrhea, unspecified type [R19.7] Patient Active Problem List   Diagnosis Date Noted   Pyelonephritis 05/31/2022   Low back pain 04/08/2022   Callus 09/14/2021   Positive D dimer 09/10/2021   Left wrist pain 04/25/2021   Tick bite of left thigh 10/17/2020   Itching 10/17/2020   Prolapse of female bladder, acquired 09/23/2020   Loose stools 04/30/2020   History of COVID-19 06/28/2019   Cough 09/02/2017   Glaucoma 03/25/2016   Type 2 diabetes mellitus with vascular disease ([place]) 11/27/2015   History of colonic polyps 04/07/2015   History of CVA (cerebrovascular accident) 08/02/2014   Stress 08/02/2014   Plantar fasciitis 07/05/2014   BMI 30.0-30.9,adult 07/05/2014   Health care maintenance 07/05/2014   Breast pain, right 05/25/2014   Pain of left heel 01/03/2014   Pain in soft tissues of limb 01/03/2014   Foot fracture, left 07/07/2013   Thrombocytopenia ([place]) 03/26/2012   Hypercholesteremia 03/26/2012   PCP:  Einar Pheasant, MD Pharmacy:   [id number] [place] [id number]: [phone]: [phone]  [facility] [address], [place] - [place] [address] Phone: [phone] Fax: [phone]  [facility] [place], [place] [address] [address] Phone: [phone] Fax: [phone]  [id number] [id number]: [phone]: [phone]    Social Determinants of Health (SDOH) Social History: SDOH Screenings   Food Insecurity: No Food Insecurity (05/31/2022)  Housing: Low Risk  (05/31/2022)  Transportation Needs: No  Transportation Needs (05/31/2022)  Utilities: Not At Risk (05/31/2022)  Depression (PHQ2-9): Low Risk  (05/02/2022)  Financial Resource Strain: Low Risk  (05/02/2022)  Physical Activity: Insufficiently Active (05/02/2022)  Social Connections: Unknown (05/02/2022)  Stress: No Stress Concern Present (05/02/2022)  Tobacco Use: Low Risk  (05/31/2022)   SDOH Interventions:     Readmission Risk Interventions     No data to display

## 2022-06-01 NOTE — Progress Notes (Signed)
PROGRESS NOTE    Victoria Harris  EVO:[id number] DOB: 05-Nov-1950 DOA: 05/31/2022 PCP: Einar Pheasant, MD    Brief Narrative:  Victoria Harris is a 72 y.o. female with medical history significant of diabetes, chronic pain, hyperlipidemia, stroke, glaucoma, diverticulosis presenting with ongoing abdominal pain and associated symptoms.   Patient reports recently diagnosed with C. difficile colitis a few weeks ago and completed her course of oral vancomycin about a week ago.  Was doing fine after that but 3 days ago began to develop abdominal pain with associated flank pain and dark urine.  Also noted some chills and diarrhea but different from previous.  Diarrhea did not occur until today when she went to surgical center for planned back surgery and she had multiple loose stools.  Due to the surgery was aborted and she was sent to the ED for further evaluation.   She denies fevers, chest pain, shortness of breath, constipation, nausea, vomiting.     Assessment and Plan: Pyelonephritis > Patient presenting with abdominal pain bilateral flank pain and dark urine.  Urinalysis showing hemoglobin, leukocytes, bacteria.  CT of the abdomen pelvis with irregular bladder wall thickening consistent with cystitis. > Concern for early pyelonephritis despite lack of leukocytosis or findings on CT given clinical findings with CVA tenderness. - Continue with ceftriaxone - Trend fever curve and WBC - Follow-up urine cultures   Recent C. difficile colitis > Patient recently underwent treatment for C. difficile colitis and completed her oral vancomycin a week ago. -pharmacy consult for prevention of re-occurrence  -lactobacillus   Chronic pain - Continue home as needed Norco   Glaucoma - Continue home eyedrops   Hyperlipidemia History of CVA - Continue home statin   DVT prophylaxis: enoxaparin (LOVENOX) injection 40 mg Start: 05/31/22 1915    Code Status: Full Code Family  Communication: at bedside  Disposition Plan:  Level of care: Telemetry Medical Status is: Inpatient Remains inpatient appropriate because: await culture    Consultants:  pharmacy   Subjective: Having uncontrollable loose stools  Objective: Vitals:   05/31/22 1811 05/31/22 2019 05/31/22 2350 06/01/22 0358  BP: (!) 111/48 (!) 92/49 (!) 111/56 (!) 115/50  Pulse: 98 89 88 85  Resp: '20 18 18 18  '$ Temp: 100.2 F (37.9 C) 98.1 F (36.7 C) (!) 100.9 F (38.3 C) 99.1 F (37.3 C)  TempSrc: Oral Oral Oral Oral  SpO2: 98% 97% 98% 98%  Weight:  78 kg      Intake/Output Summary (Last 24 hours) at 06/01/2022 0803 Last data filed at 06/01/2022 0600 Gross per 24 hour  Intake 3076.45 ml  Output 450 ml  Net 2626.45 ml   Filed Weights   05/31/22 2019  Weight: 78 kg    Examination:   General: Appearance:     Overweight female in no acute distress     Lungs:     respirations unlabored  Heart:    Normal heart rate.    MS:   All extremities are intact.    Neurologic:   Awake, alert       Data Reviewed: I have personally reviewed following labs and imaging studies  CBC: Recent Labs  Lab 05/31/22 0829  WBC 10.4  HGB 15.3*  HCT 43.8  MCV 91.8  PLT 299   Basic Metabolic Panel: Recent Labs  Lab 05/31/22 0829  NA 137  K 3.7  CL 97*  CO2 25  GLUCOSE 220*  BUN 20  CREATININE 0.78  CALCIUM 10.1   GFR:  Estimated Creatinine Clearance: 65.6 mL/min (by C-G formula based on SCr of 0.78 mg/dL). Liver Function Tests: No results for input(s): "AST", "ALT", "ALKPHOS", "BILITOT", "PROT", "ALBUMIN" in the last 168 hours. No results for input(s): "LIPASE", "AMYLASE" in the last 168 hours. No results for input(s): "AMMONIA" in the last 168 hours. Coagulation Profile: No results for input(s): "INR", "PROTIME" in the last 168 hours. Cardiac Enzymes: No results for input(s): "CKTOTAL", "CKMB", "CKMBINDEX", "TROPONINI" in the last 168 hours. BNP (last 3 results) Recent Labs     09/07/21 1424  PROBNP 75.0   HbA1C: No results for input(s): "HGBA1C" in the last 72 hours. CBG: Recent Labs  Lab 05/31/22 0828 05/31/22 2021 06/01/22 0731  GLUCAP 213* 149* 147*   Lipid Profile: No results for input(s): "CHOL", "HDL", "LDLCALC", "TRIG", "CHOLHDL", "LDLDIRECT" in the last 72 hours. Thyroid Function Tests: No results for input(s): "TSH", "T4TOTAL", "FREET4", "T3FREE", "THYROIDAB" in the last 72 hours. Anemia Panel: No results for input(s): "VITAMINB12", "FOLATE", "FERRITIN", "TIBC", "IRON", "RETICCTPCT" in the last 72 hours. Sepsis Labs: No results for input(s): "PROCALCITON", "LATICACIDVEN" in the last 168 hours.  Recent Results (from the past 240 hour(s))  Resp panel by RT-PCR (RSV, Flu A&B, Covid) Anterior Nasal Swab     Status: None   Collection Time: 05/31/22 10:18 AM   Specimen: Anterior Nasal Swab  Result Value Ref Range Status   SARS Coronavirus 2 by RT PCR NEGATIVE NEGATIVE Final    Comment: (NOTE) SARS-CoV-2 target nucleic acids are NOT DETECTED.  The SARS-CoV-2 RNA is generally detectable in upper respiratory specimens during the acute phase of infection. The lowest concentration of SARS-CoV-2 viral copies this assay can detect is 138 copies/mL. A negative result does not preclude SARS-Cov-2 infection and should not be used as the sole basis for treatment or other patient management decisions. A negative result may occur with  improper specimen collection/handling, submission of specimen other than nasopharyngeal swab, presence of viral mutation(s) within the areas targeted by this assay, and inadequate number of viral copies(<138 copies/mL). A negative result must be combined with clinical observations, patient history, and epidemiological information. The expected result is Negative.  Fact Sheet for Patients:  EntrepreneurPulse.com.au  Fact Sheet for Healthcare Providers:  IncredibleEmployment.be  This  test is no t yet approved or cleared by the [place] FDA and  has been authorized for detection and/or diagnosis of SARS-CoV-2 by FDA under an Emergency Use Authorization (EUA). This EUA will remain  in effect (meaning this test can be used) for the duration of the COVID-19 declaration under Section 564(b)(1) of the Act, 21 U.S.C.section 360bbb-3(b)(1), unless the authorization is terminated  or revoked sooner.       Influenza A by PCR NEGATIVE NEGATIVE Final   Influenza B by PCR NEGATIVE NEGATIVE Final    Comment: (NOTE) The Xpert Xpress SARS-CoV-2/FLU/RSV plus assay is intended as an aid in the diagnosis of influenza from Nasopharyngeal swab specimens and should not be used as a sole basis for treatment. Nasal washings and aspirates are unacceptable for Xpert Xpress SARS-CoV-2/FLU/RSV testing.  Fact Sheet for Patients: EntrepreneurPulse.com.au  Fact Sheet for Healthcare Providers: IncredibleEmployment.be  This test is not yet approved or cleared by the [place] FDA and has been authorized for detection and/or diagnosis of SARS-CoV-2 by FDA under an Emergency Use Authorization (EUA). This EUA will remain in effect (meaning this test can be used) for the duration of the COVID-19 declaration under Section 564(b)(1) of the Act, 21 U.S.C. section 360bbb-3(b)(1), unless  the authorization is terminated or revoked.     Resp Syncytial Virus by PCR NEGATIVE NEGATIVE Final    Comment: (NOTE) Fact Sheet for Patients: EntrepreneurPulse.com.au  Fact Sheet for Healthcare Providers: IncredibleEmployment.be  This test is not yet approved or cleared by the [place] FDA and has been authorized for detection and/or diagnosis of SARS-CoV-2 by FDA under an Emergency Use Authorization (EUA). This EUA will remain in effect (meaning this test can be used) for the duration of the COVID-19 declaration under  Section 564(b)(1) of the Act, 21 U.S.C. section 360bbb-3(b)(1), unless the authorization is terminated or revoked.  Performed at [facility], [address], [place], [place] [address]          Radiology Studies: CT ABDOMEN PELVIS W CONTRAST  Result Date: 05/31/2022 CLINICAL DATA:  Abdominal pain, acute, nonlocalized Abdominal pain, chills, hypoxia, recent C. difficile, diarrhea again. EXAM: CT ABDOMEN AND PELVIS WITH CONTRAST TECHNIQUE: Multidetector CT imaging of the abdomen and pelvis was performed using the standard protocol following bolus administration of intravenous contrast. RADIATION DOSE REDUCTION: This exam was performed according to the departmental dose-optimization program which includes automated exposure control, adjustment of the mA and/or kV according to patient size and/or use of iterative reconstruction technique. CONTRAST:  30m OMNIPAQUE IOHEXOL 300 MG/ML  SOLN COMPARISON:  None Available. FINDINGS: Lower chest: No acute abnormality. Hepatobiliary: No focal liver abnormality is seen. Prior cholecystectomy with expected mild prominence of the biliary system. Pancreas: Unremarkable. No pancreatic ductal dilatation or surrounding inflammatory changes. Spleen: Normal in size without focal abnormality. Adrenals/Urinary Tract: Adrenal glands are unremarkable. No hydronephrosis or nephrolithiasis. The bladder is mildly distended. There is irregular wall thickening at the base of the bladder. Stomach/Bowel: The stomach is within normal limits. There is no evidence of bowel obstruction.The appendix is normal. Colonic diverticulosis. No evidence of acute diverticulitis. Vascular/Lymphatic: Scattered atherosclerosis. No AAA. No lymphadenopathy. Collateral vessels with splenorenal shunt noted. Reproductive: Prior hysterectomy. Other: No abdominal wall hernia or abnormality. No abdominopelvic ascites. Musculoskeletal: No acute osseous abnormality min. No suspicious  osseous lesion. Multilevel degenerative changes of the spine. IMPRESSION: Irregular bladder wall thickening at the bladder base, correlate with urinalysis for possible cystitis. Colonic diverticulosis without evidence of acute diverticulitis. Normal appendix. Electronically Signed   By: JMaurine SimmeringM.D.   On: 05/31/2022 11:44   DG Chest Portable 1 View  Result Date: 05/31/2022 CLINICAL DATA:  Chills, nausea, diarrhea, abdominal pain, fatigue and hypoxia. EXAM: PORTABLE CHEST 1 VIEW COMPARISON:  09/10/2021 FINDINGS: Heart size and mediastinal contours are unremarkable. No pleural effusion or edema. No airspace opacities identified. Visualized osseous structures are unremarkable. IMPRESSION: No active disease. Electronically Signed   By: TKerby MoorsM.D.   On: 05/31/2022 11:17        Scheduled Meds:  acidophilus  2 capsule Oral Daily   dorzolamide-timolol  1 drop Both Eyes BID   enoxaparin (LOVENOX) injection  40 mg Subcutaneous Q24H   insulin aspart  0-15 Units Subcutaneous TID WC   insulin aspart  0-5 Units Subcutaneous QHS   latanoprost  1 drop Both Eyes QHS   rosuvastatin  5 mg Oral Once per day on Mon Wed Fri   sodium chloride flush  3 mL Intravenous Q12H   Continuous Infusions:  cefTRIAXone (ROCEPHIN)  IV     lactated ringers Stopped (05/31/22 1722)     LOS: 1 day    Time spent: 45 minutes spent on chart review, discussion with nursing staff, consultants, updating family and  interview/physical exam; more than 50% of that time was spent in counseling and/or coordination of care.    Geradine Girt, DO [facility] Available via Epic secure chat 7am-7pm After these hours, please refer to coverage provider listed on [email or web address] 06/01/2022, 8:03 AM

## 2022-06-01 NOTE — Progress Notes (Signed)
Pharmacy Antibiotic Note  Victoria Harris is a 72 y.o. female admitted on 05/31/2022 with abdominal pain, found to have pyelonephritis and started on Rocephin.  Patient recently completed a course of PO vancomycin for C.diff.  She is high risk for recurrence of C.diff; therefore, Pharmacy has been consulted for PO vancomycin dosing for prophylaxis.  Patient is still having diarrhea per documentation.  Plan: PO vanc '125mg'$  PO BID Continue for 7 days after the end of antibiotic course Pharmacy will follow peripherally to add end date for PO vanc  Weight: 78 kg (171 lb 15.3 oz)  Temp (24hrs), Avg:99.7 F (37.6 C), Min:98.1 F (36.7 C), Max:101.5 F (38.6 C)  Recent Labs  Lab 05/31/22 0829  WBC 10.4  CREATININE 0.78    Estimated Creatinine Clearance: 65.6 mL/min (by C-G formula based on SCr of 0.78 mg/dL).    Allergies  Allergen Reactions   Glucophage Xr [Metformin]     diarrhea   Jardiance [Empagliflozin]     Seen 09/07/21 on this day Stopped jardiance due to pt easily dehydrates at baseline and felt like this was making this worse and causing to feel like she was going to pass out or pass out    Tylenol [Acetaminophen] Hives    Rocephin 1/4 >>  PO Vanc 1/4 >>    1/4 UCx -    Victoria Harris D. Mina Marble, PharmD, BCPS, [place] 06/01/2022, 8:46 AM

## 2022-06-02 DIAGNOSIS — N12 Tubulo-interstitial nephritis, not specified as acute or chronic: Secondary | ICD-10-CM | POA: Diagnosis not present

## 2022-06-02 LAB — MAGNESIUM: Magnesium: 2 mg/dL (ref 1.7–2.4)

## 2022-06-02 LAB — URINE CULTURE: Culture: >=100000 — AB

## 2022-06-02 LAB — GLUCOSE, CAPILLARY
Glucose-Capillary: 136 mg/dL — ABNORMAL HIGH (ref 70–99)
Glucose-Capillary: 144 mg/dL — ABNORMAL HIGH (ref 70–99)
Glucose-Capillary: 150 mg/dL — ABNORMAL HIGH (ref 70–99)

## 2022-06-02 LAB — POTASSIUM: Potassium: 3.3 mmol/L — ABNORMAL LOW (ref 3.5–5.1)

## 2022-06-02 MED ORDER — SODIUM CHLORIDE 0.9 % IV SOLN
1.0000 g | Freq: Once | INTRAVENOUS | Status: AC
  Administered 2022-06-02: 1 g via INTRAVENOUS
  Filled 2022-06-02: qty 10

## 2022-06-02 MED ORDER — CEFADROXIL 500 MG PO CAPS: 500.0000 mg | ORAL_CAPSULE | Freq: Two times a day (BID) | ORAL | 0 refills | Status: AC

## 2022-06-02 MED ORDER — RISAQUAD PO CAPS: 2.0000 | ORAL_CAPSULE | Freq: Every day | ORAL | 0 refills | Status: AC

## 2022-06-02 MED ORDER — POTASSIUM CHLORIDE CRYS ER 20 MEQ PO TBCR
40.0000 meq | EXTENDED_RELEASE_TABLET | Freq: Once | ORAL | Status: AC
  Administered 2022-06-02: 40 meq via ORAL
  Filled 2022-06-02: qty 2

## 2022-06-02 NOTE — Plan of Care (Signed)
  Problem: Education: Goal: Knowledge of General Education information will improve Description: Including pain rating scale, medication(s)/side effects and non-pharmacologic comfort measures 06/02/2022 0946 by Emmaline Life, RN Outcome: Progressing 06/02/2022 0945 by Emmaline Life, RN Outcome: Progressing

## 2022-06-02 NOTE — Hospital Course (Signed)
Victoria Harris is a 72 y.o. female with a history of diabetes mellitus, chronic pain, hyperlipidemia, stroke, glaucoma, diverticulosis. Patient presented secondary to abdominal pain and found to have evidence of likely pyelonephritis. Empiric antibiotics started. CT imaging confirmed likely cystitis. Urine culture significant for E. Coli and patient transitioned to outpatient antibiotics to complete a 7 day course.

## 2022-06-02 NOTE — Discharge Summary (Signed)
Physician Discharge Summary   Patient: Victoria Harris MRN: [id number] DOB: [birth year]  Admit date:     05/31/2022  Discharge date: 06/02/22  Discharge Physician: Cordelia Poche, MD   PCP: Einar Pheasant, MD   Recommendations at discharge:  Hospital follow-up with PCP  Discharge Diagnoses: Principal Problem:   Pyelonephritis Active Problems:   Hypercholesteremia   History of CVA (cerebrovascular accident)   Type 2 diabetes mellitus with vascular disease ([place])   Glaucoma  Resolved Problems:   * No resolved hospital problems. *  Hospital Course: Victoria Harris is a 72 y.o. female with a history of diabetes mellitus, chronic pain, hyperlipidemia, stroke, glaucoma, diverticulosis. Patient presented secondary to abdominal pain and found to have evidence of likely pyelonephritis. Empiric antibiotics started. CT imaging confirmed likely cystitis. Urine culture significant for E. Coli and patient transitioned to outpatient antibiotics to complete a 7 day course.  Assessment and Plan:  Acute pyelonephritis Patient with flank pain and evidence of UTI. CT abdomen/pelvis suggestive of cystitis without CT evidence of pyelonephritis. Patient managed empirically with Ceftriaxone IV. Urine culture confirmed E. Coli infection. Patient transitioned to Cefadroxil on discharge to complete a 7 day course of antibiotics  History of C. Difficile colitis Patient completed treatment. No signs consistent with acute C. Difficile infection. Patient was managed on Vancomycin inpatient but this was discontinued prior to discharge.  Chronic pain Glaucoma Hyperlipidemia History of CVA Diabetes mellitus type 2 No changes to outpatient regimen.  Consultants: None Procedures performed: NOne  Disposition: Home Diet recommendation: Cardiac and Carb modified diet   DISCHARGE MEDICATION: Allergies as of 06/02/2022       Reactions   Glucophage Xr [metformin]    diarrhea   Jardiance  [empagliflozin]    Seen 09/07/21 on this day Stopped jardiance due to pt easily dehydrates at baseline and felt like this was making this worse and causing to feel like she was going to pass out or pass out    Tylenol [acetaminophen] Hives        Medication List     STOP taking these medications    meloxicam 7.5 MG tablet Commonly known as: MOBIC   vancomycin 125 MG capsule Commonly known as: VANCOCIN       TAKE these medications    aspirin 81 MG tablet Take 81 mg by mouth daily.   cefadroxil 500 MG capsule Commonly known as: DURICEF Take 1 capsule (500 mg total) by mouth 2 (two) times daily for 4 days. Start taking on: June 03, 2022   Cholecalciferol 25 MCG (1000 UT) tablet Take 1,000 Units by mouth daily.   dorzolamide-timolol 2-0.5 % ophthalmic solution Commonly known as: COSOPT Place 1 drop into both eyes 2 (two) times daily.   HYDROcodone-acetaminophen 5-325 MG tablet Commonly known as: NORCO/VICODIN One tablet q 6-8 hours prn increased pain.   latanoprost 0.005 % ophthalmic solution Commonly known as: XALATAN 1 drop at bedtime.   MULTIVITAMIN ADULT PO Take by mouth daily.   ondansetron 4 MG disintegrating tablet Commonly known as: ZOFRAN-ODT Take 1 tablet (4 mg total) by mouth every 8 (eight) hours as needed.   OneTouch Verio test strip Generic drug: glucose blood USE AS INSTRUCTED TO CHECK BLOOD SUGARS DAILY. DX E11.9   Probiotic Daily Caps Take 1 capsule by mouth daily. What changed: Another medication with the same name was added. Make sure you understand how and when to take each.   acidophilus Caps capsule Take 2 capsules by mouth daily for 5 days.  Start taking on: June 03, 2022 What changed: You were already taking a medication with the same name, and this prescription was added. Make sure you understand how and when to take each.   rosuvastatin 5 MG tablet Commonly known as: CRESTOR Take 1 tablet (5 mg total) by mouth daily.    traMADol 50 MG tablet Commonly known as: ULTRAM Take 50 mg by mouth 4 (four) times daily.        Discharge Exam: BP 125/75 (BP Location: Right Arm)   Pulse 68   Temp 98.3 F (36.8 C) (Oral)   Resp 18   Wt 78 kg   SpO2 100%   BMI 28.62 kg/m   General exam: Appears calm and comfortable Respiratory system: Clear to auscultation. Respiratory effort normal. Cardiovascular system: S1 & S2 heard, RRR. No murmurs, rubs, gallops or clicks. Gastrointestinal system: Abdomen is nondistended, soft and nontender. No organomegaly or masses felt. Normal bowel sounds heard. Central nervous system: Alert and oriented. No focal neurological deficits. Musculoskeletal: No edema. No calf tenderness Skin: No cyanosis. No rashes Psychiatry: Judgement and insight appear normal. Mood & affect appropriate.   Condition at discharge: stable  The results of significant diagnostics from this hospitalization (including imaging, microbiology, ancillary and laboratory) are listed below for reference.   Imaging Studies: CT ABDOMEN PELVIS W CONTRAST  Result Date: 05/31/2022 CLINICAL DATA:  Abdominal pain, acute, nonlocalized Abdominal pain, chills, hypoxia, recent C. difficile, diarrhea again. EXAM: CT ABDOMEN AND PELVIS WITH CONTRAST TECHNIQUE: Multidetector CT imaging of the abdomen and pelvis was performed using the standard protocol following bolus administration of intravenous contrast. RADIATION DOSE REDUCTION: This exam was performed according to the departmental dose-optimization program which includes automated exposure control, adjustment of the mA and/or kV according to patient size and/or use of iterative reconstruction technique. CONTRAST:  63m OMNIPAQUE IOHEXOL 300 MG/ML  SOLN COMPARISON:  None Available. FINDINGS: Lower chest: No acute abnormality. Hepatobiliary: No focal liver abnormality is seen. Prior cholecystectomy with expected mild prominence of the biliary system. Pancreas: Unremarkable. No  pancreatic ductal dilatation or surrounding inflammatory changes. Spleen: Normal in size without focal abnormality. Adrenals/Urinary Tract: Adrenal glands are unremarkable. No hydronephrosis or nephrolithiasis. The bladder is mildly distended. There is irregular wall thickening at the base of the bladder. Stomach/Bowel: The stomach is within normal limits. There is no evidence of bowel obstruction.The appendix is normal. Colonic diverticulosis. No evidence of acute diverticulitis. Vascular/Lymphatic: Scattered atherosclerosis. No AAA. No lymphadenopathy. Collateral vessels with splenorenal shunt noted. Reproductive: Prior hysterectomy. Other: No abdominal wall hernia or abnormality. No abdominopelvic ascites. Musculoskeletal: No acute osseous abnormality min. No suspicious osseous lesion. Multilevel degenerative changes of the spine. IMPRESSION: Irregular bladder wall thickening at the bladder base, correlate with urinalysis for possible cystitis. Colonic diverticulosis without evidence of acute diverticulitis. Normal appendix. Electronically Signed   By: JMaurine SimmeringM.D.   On: 05/31/2022 11:44   DG Chest Portable 1 View  Result Date: 05/31/2022 CLINICAL DATA:  Chills, nausea, diarrhea, abdominal pain, fatigue and hypoxia. EXAM: PORTABLE CHEST 1 VIEW COMPARISON:  09/10/2021 FINDINGS: Heart size and mediastinal contours are unremarkable. No pleural effusion or edema. No airspace opacities identified. Visualized osseous structures are unremarkable. IMPRESSION: No active disease. Electronically Signed   By: TKerby MoorsM.D.   On: 05/31/2022 11:17   MR LUMBAR SPINE WO CONTRAST  Result Date: 05/06/2022 CLINICAL DATA:  Back pain with symptoms persisting over 6 weeks of treatment. History of bulging disc and chronic pain. EXAM: MRI LUMBAR SPINE  WITHOUT CONTRAST TECHNIQUE: Multiplanar, multisequence MR imaging of the lumbar spine was performed. No intravenous contrast was administered. COMPARISON:  05/23/2004  FINDINGS: Segmentation:  5 lumbar type vertebrae Alignment: L4-5 anterolisthesis measuring 5 mm. Mild dextrocurvature. Vertebrae:  No fracture, evidence of discitis, or bone lesion. Conus medullaris and cauda equina: Conus extends to the L1 level. Conus and cauda equina appear normal. Paraspinal and other soft tissues: Negative for perispinal mass or inflammation Disc levels: L2-L3: Disc narrowing and bulging with endplate degeneration. Negative facets L3-L4: Disc narrowing and bulging. Negative facets. No neural compression L4-L5: Advanced facet osteoarthritis with gaping fluid-filled joints and ligamentum flavum thickening. The disc is narrowed and bulging. Advanced spinal stenosis with bilateral L5 compression at the subarticular recesses. Bilateral foraminal narrowing is mild. L5-S1:Disc narrowing and bulging with right paracentral herniation impinging on the descending right S1 nerve root. Negative facets. IMPRESSION: 1. 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 2. 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 3. L4-5 advanced facet degeneration with anterolisthesis and compressive spinal/subarticular recess stenosis. Electronically Signed   By: Jorje Guild M.D.   On: 05/06/2022 11:47    Microbiology: Results for orders placed or performed during the hospital encounter of 05/31/22  Urine Culture     Status: Abnormal   Collection Time: 05/31/22  8:29 AM   Specimen: Urine, Clean Catch  Result Value Ref Range Status   Specimen Description   Final    URINE, CLEAN CATCH Performed at [facility], [address], [place], [place] [address]    Special Requests   Final    NONE Performed at [facility], [address], [place], [place] [address]    Culture >=100,000 COLONIES/mL ESCHERICHIA COLI (A)  Final   Report Status 06/02/2022 FINAL  Final   Organism ID, Bacteria ESCHERICHIA COLI (A)  Final       Susceptibility   Escherichia coli - MIC*    AMPICILLIN >=32 RESISTANT Resistant     CEFAZOLIN <=4 SENSITIVE Sensitive     CEFEPIME <=0.12 SENSITIVE Sensitive     CEFTRIAXONE <=0.25 SENSITIVE Sensitive     CIPROFLOXACIN <=0.25 SENSITIVE Sensitive     GENTAMICIN <=1 SENSITIVE Sensitive     IMIPENEM <=0.25 SENSITIVE Sensitive     NITROFURANTOIN <=16 SENSITIVE Sensitive     TRIMETH/SULFA <=20 SENSITIVE Sensitive     AMPICILLIN/SULBACTAM 16 INTERMEDIATE Intermediate     PIP/TAZO <=4 SENSITIVE Sensitive     * >=100,000 COLONIES/mL ESCHERICHIA COLI  Resp panel by RT-PCR (RSV, Flu A&B, Covid) Anterior Nasal Swab     Status: None   Collection Time: 05/31/22 10:18 AM   Specimen: Anterior Nasal Swab  Result Value Ref Range Status   SARS Coronavirus 2 by RT PCR NEGATIVE NEGATIVE Final    Comment: (NOTE) SARS-CoV-2 target nucleic acids are NOT DETECTED.  The SARS-CoV-2 RNA is generally detectable in upper respiratory specimens during the acute phase of infection. The lowest concentration of SARS-CoV-2 viral copies this assay can detect is 138 copies/mL. A negative result does not preclude SARS-Cov-2 infection and should not be used as the sole basis for treatment or other patient management decisions. A negative result may occur with  improper specimen collection/handling, submission of specimen other than nasopharyngeal swab, presence of viral mutation(s) within the areas targeted by this assay, and inadequate number of viral copies(<138 copies/mL). A negative result must be combined with clinical observations, patient history, and epidemiological information. The expected result is Negative.  Fact Sheet for Patients:  EntrepreneurPulse.com.au  Fact Sheet for Healthcare Providers:  IncredibleEmployment.be  This test is no t yet approved or cleared by the [place] FDA and  has been authorized for detection and/or diagnosis of SARS-CoV-2 by FDA  under an Emergency Use Authorization (EUA). This EUA will remain  in effect (meaning this test can be used) for the duration of the COVID-19 declaration under Section 564(b)(1) of the Act, 21 U.S.C.section 360bbb-3(b)(1), unless the authorization is terminated  or revoked sooner.       Influenza A by PCR NEGATIVE NEGATIVE Final   Influenza B by PCR NEGATIVE NEGATIVE Final    Comment: (NOTE) The Xpert Xpress SARS-CoV-2/FLU/RSV plus assay is intended as an aid in the diagnosis of influenza from Nasopharyngeal swab specimens and should not be used as a sole basis for treatment. Nasal washings and aspirates are unacceptable for Xpert Xpress SARS-CoV-2/FLU/RSV testing.  Fact Sheet for Patients: EntrepreneurPulse.com.au  Fact Sheet for Healthcare Providers: IncredibleEmployment.be  This test is not yet approved or cleared by the [place] FDA and has been authorized for detection and/or diagnosis of SARS-CoV-2 by FDA under an Emergency Use Authorization (EUA). This EUA will remain in effect (meaning this test can be used) for the duration of the COVID-19 declaration under Section 564(b)(1) of the Act, 21 U.S.C. section 360bbb-3(b)(1), unless the authorization is terminated or revoked.     Resp Syncytial Virus by PCR NEGATIVE NEGATIVE Final    Comment: (NOTE) Fact Sheet for Patients: EntrepreneurPulse.com.au  Fact Sheet for Healthcare Providers: IncredibleEmployment.be  This test is not yet approved or cleared by the [place] FDA and has been authorized for detection and/or diagnosis of SARS-CoV-2 by FDA under an Emergency Use Authorization (EUA). This EUA will remain in effect (meaning this test can be used) for the duration of the COVID-19 declaration under Section 564(b)(1) of the Act, 21 U.S.C. section 360bbb-3(b)(1), unless the authorization is terminated or revoked.  Performed at [facility], [address] [place], [place] [address]     Labs: CBC: Recent Labs  Lab 05/31/22 0829 06/01/22 0740  WBC 10.4 7.3  HGB 15.3* 12.1  HCT 43.8 35.4*  MCV 91.8 93.7  PLT 184 235*   Basic Metabolic Panel: Recent Labs  Lab 05/31/22 0829 06/01/22 0740 06/02/22 1143  NA 137 137  --   K 3.7 3.0* 3.3*  CL 97* 104  --   CO2 25 25  --   GLUCOSE 220* 155*  --   BUN 20 10  --   CREATININE 0.78 0.67  --   CALCIUM 10.1 8.6*  --   MG  --   --  2.0   Liver Function Tests: No results for input(s): "AST", "ALT", "ALKPHOS", "BILITOT", "PROT", "ALBUMIN" in the last 168 hours. CBG: Recent Labs  Lab 06/01/22 1126 06/01/22 1650 06/01/22 2029 06/02/22 0724 06/02/22 1118  GLUCAP 174* 131* 163* 136* 144*    Discharge time spent: 35 minutes.  Signed: Cordelia Poche, MD [facility] 06/02/2022

## 2022-06-02 NOTE — Progress Notes (Signed)
SATURATION QUALIFICATIONS: (This note is used to comply with regulatory documentation for home oxygen)  Patient Saturations on Room Air at Rest = 92%  Patient Saturations on Room Air while Ambulating = 96%   Please briefly explain why patient needs home oxygen: Pt did not require supplemental O2 to maintain adequate oxygen sats.   Reuel Derby, PT, DPT  Acute Rehabilitation Services  Office: [phone]

## 2022-06-02 NOTE — Plan of Care (Signed)
  Problem: Education: Goal: Knowledge of General Education information will improve Description Including pain rating scale, medication(s)/side effects and non-pharmacologic comfort measures Outcome: Progressing   

## 2022-06-02 NOTE — Plan of Care (Signed)
  Problem: Education: Goal: Knowledge of General Education information will improve Description: Including pain rating scale, medication(s)/side effects and non-pharmacologic comfort measures 06/02/2022 1510 by Emmaline Life, RN Outcome: Adequate for Discharge 06/02/2022 0946 by Emmaline Life, RN Outcome: Progressing 06/02/2022 0945 by Emmaline Life, RN Outcome: Progressing   Problem: Health Behavior/Discharge Planning: Goal: Ability to manage health-related needs will improve Outcome: Adequate for Discharge   Problem: Clinical Measurements: Goal: Ability to maintain clinical measurements within normal limits will improve Outcome: Adequate for Discharge Goal: Will remain free from infection Outcome: Adequate for Discharge Goal: Diagnostic test results will improve Outcome: Adequate for Discharge Goal: Respiratory complications will improve Outcome: Adequate for Discharge Goal: Cardiovascular complication will be avoided Outcome: Adequate for Discharge   Problem: Activity: Goal: Risk for activity intolerance will decrease Outcome: Adequate for Discharge   Problem: Nutrition: Goal: Adequate nutrition will be maintained Outcome: Adequate for Discharge   Problem: Coping: Goal: Level of anxiety will decrease Outcome: Adequate for Discharge   Problem: Elimination: Goal: Will not experience complications related to bowel motility Outcome: Adequate for Discharge Goal: Will not experience complications related to urinary retention Outcome: Adequate for Discharge   Problem: Pain Managment: Goal: General experience of comfort will improve Outcome: Adequate for Discharge   Problem: Safety: Goal: Ability to remain free from injury will improve Outcome: Adequate for Discharge   Problem: Skin Integrity: Goal: Risk for impaired skin integrity will decrease Outcome: Adequate for Discharge   Problem: Education: Goal: Ability to describe self-care measures that may prevent  or decrease complications (Diabetes Survival Skills Education) will improve Outcome: Adequate for Discharge Goal: Individualized Educational Video(s) Outcome: Adequate for Discharge   Problem: Coping: Goal: Ability to adjust to condition or change in health will improve Outcome: Adequate for Discharge   Problem: Fluid Volume: Goal: Ability to maintain a balanced intake and output will improve Outcome: Adequate for Discharge   Problem: Health Behavior/Discharge Planning: Goal: Ability to identify and utilize available resources and services will improve Outcome: Adequate for Discharge Goal: Ability to manage health-related needs will improve Outcome: Adequate for Discharge   Problem: Metabolic: Goal: Ability to maintain appropriate glucose levels will improve Outcome: Adequate for Discharge   Problem: Nutritional: Goal: Maintenance of adequate nutrition will improve Outcome: Adequate for Discharge Goal: Progress toward achieving an optimal weight will improve Outcome: Adequate for Discharge   Problem: Skin Integrity: Goal: Risk for impaired skin integrity will decrease Outcome: Adequate for Discharge   Problem: Tissue Perfusion: Goal: Adequacy of tissue perfusion will improve Outcome: Adequate for Discharge   Problem: Acute Rehab PT Goals(only PT should resolve) Goal: Pt Will Go Supine/Side To Sit Outcome: Adequate for Discharge Goal: Pt Will Go Sit To Supine/Side Outcome: Adequate for Discharge Goal: Patient Will Transfer Sit To/From Stand Outcome: Adequate for Discharge Goal: Pt Will Ambulate Outcome: Adequate for Discharge Goal: Pt Will Go Up/Down Stairs Outcome: Adequate for Discharge

## 2022-06-02 NOTE — Evaluation (Signed)
Physical Therapy Evaluation Patient Details Name: Victoria Harris MRN: [id number] DOB: [birth year] Today's Date: 06/02/2022  History of Present Illness  Pt is a 72 y/o female admitted secondary to c diff and pyelonephritis. Pt scheduled for back surgery, but was cancelled secondary to symptoms. PMH includes DM, back/RLE pain, CVA, and glaucoma.  Clinical Impression  Pt admitted secondary to problem above with deficits below. Pt with RLE pain, but reporting this is baseline. Educated about using RW at home to help offweight RLE and about walking program to minimize deconditioning. Oxygen sats WFL on RA throughout. Anticipate pt will progress well and will not require follow up PT. Plan is to hopefully reschedule surgery to help with RLE pain. Will continue to follow acutely.        Recommendations for follow up therapy are one component of a multi-disciplinary discharge planning process, led by the attending physician.  Recommendations may be updated based on patient status, additional functional criteria and insurance authorization.  Follow Up Recommendations No PT follow up      Assistance Recommended at Discharge Intermittent Supervision/Assistance  Patient can return home with the following  Help with stairs or ramp for entrance;Assist for transportation;Assistance with cooking/housework    Equipment Recommendations None recommended by PT  Recommendations for Other Services       Functional Status Assessment Patient has had a recent decline in their functional status and demonstrates the ability to make significant improvements in function in a reasonable and predictable amount of time.     Precautions / Restrictions Precautions Precautions: Fall Restrictions Weight Bearing Restrictions: No      Mobility  Bed Mobility Overal bed mobility: Needs Assistance Bed Mobility: Sidelying to Sit, Rolling, Sit to Sidelying Rolling: Min guard Sidelying to sit: Min guard     Sit  to sidelying: Min assist General bed mobility comments: Educated about use of log roll technique and required cues for sequencing. Min A for LE assist to return to sidelying.    Transfers Overall transfer level: Needs assistance Equipment used: Rolling walker (2 wheels) Transfers: Sit to/from Stand Sit to Stand: Min guard           General transfer comment: Min guard for safety.    Ambulation/Gait Ambulation/Gait assistance: Min guard Gait Distance (Feet): 20 Feet (X2) Assistive device: Rolling walker (2 wheels) Gait Pattern/deviations: Step-through pattern, Decreased stride length Gait velocity: Decreased     General Gait Details: Instructed to push through BUE to take weight off of RLE to help with pain management. Pt tolerated gait within the room well this session. Educated about performing short distance gait at home to prevent further deconditioning.  Stairs Stairs:  (Verbally reviewed LE sequencing for stair navigation.)          Wheelchair Mobility    Modified Rankin (Stroke Patients Only)       Balance Overall balance assessment: Mild deficits observed, not formally tested                                           Pertinent Vitals/Pain Pain Assessment Pain Assessment: Faces Faces Pain Scale: Hurts even more Pain Location: RLE Pain Descriptors / Indicators: Throbbing Pain Intervention(s): Limited activity within patient's tolerance, Monitored during session, Repositioned    Home Living Family/patient expects to be discharged to:: Private residence Living Arrangements: Spouse/significant other Available Help at Discharge: Family Type of Home: House  Home Access: Stairs to enter Entrance Stairs-Rails: None Entrance Stairs-Number of Steps: 2   Home Layout: Two level;Able to live on main level with bedroom/bathroom Home Equipment: Rolling Walker (2 wheels)      Prior Function Prior Level of Function : Independent/Modified  Independent             Mobility Comments: Has been very limited in ambulation tolerance secondary to pain in RLE from back issues.       Hand Dominance        Extremity/Trunk Assessment   Upper Extremity Assessment Upper Extremity Assessment: Overall WFL for tasks assessed    Lower Extremity Assessment Lower Extremity Assessment: Generalized weakness;RLE deficits/detail RLE Deficits / Details: RLE pain extending down to toes. Pt with hx of back issues that is causing RLE pain.    Cervical / Trunk Assessment Cervical / Trunk Assessment: Other exceptions Cervical / Trunk Exceptions: hx of back/RLE pain; scheduled for surgery, but was cancelled prior to admission  Communication   Communication: No difficulties  Cognition Arousal/Alertness: Awake/alert Behavior During Therapy: WFL for tasks assessed/performed Overall Cognitive Status: Within Functional Limits for tasks assessed                                          General Comments General comments (skin integrity, edema, etc.): Pt's husband present throughout    Exercises     Assessment/Plan    PT Assessment Patient needs continued PT services  PT Problem List Decreased strength;Decreased activity tolerance;Decreased balance;Decreased mobility;Decreased knowledge of use of DME;Decreased knowledge of precautions;Pain       PT Treatment Interventions DME instruction;Gait training;Functional mobility training;Stair training;Therapeutic activities;Therapeutic exercise;Cognitive remediation    PT Goals (Current goals can be found in the Care Plan section)  Acute Rehab PT Goals Patient Stated Goal: to decrease pain PT Goal Formulation: With patient/family Time For Goal Achievement: 06/16/22 Potential to Achieve Goals: Good    Frequency Min 3X/week     Co-evaluation               AM-PAC PT "6 Clicks" Mobility  Outcome Measure Help needed turning from your back to your side while in a  flat bed without using bedrails?: A Little Help needed moving from lying on your back to sitting on the side of a flat bed without using bedrails?: A Little Help needed moving to and from a bed to a chair (including a wheelchair)?: A Little Help needed standing up from a chair using your arms (e.g., wheelchair or bedside chair)?: A Little Help needed to walk in hospital room?: A Little Help needed climbing 3-5 steps with a railing? : A Little 6 Click Score: 18    End of Session Equipment Utilized During Treatment: Gait belt Activity Tolerance: Patient tolerated treatment well Patient left: in bed;with call bell/phone within reach;with family/visitor present Nurse Communication: Mobility status PT Visit Diagnosis: Other abnormalities of gait and mobility (R26.89);Difficulty in walking, not elsewhere classified (R26.2);Pain Pain - Right/Left: Right Pain - part of body: Leg    Time: 9629-5284 PT Time Calculation (min) (ACUTE ONLY): 25 min   Charges:   PT Evaluation $PT Eval Low Complexity: 1 Low PT Treatments $Gait Training: 8-22 mins        Lou Miner, DPT  Acute Rehabilitation Services  Office: [phone]   Rudean Hitt 06/02/2022, 1:08 PM

## 2022-06-02 NOTE — Discharge Instructions (Addendum)
Victoria Harris,  You were in the hospital because of a kidney infection. This is being treated well with antibiotics. Please follow-up with your PCP.

## 2022-06-04 ENCOUNTER — Telehealth: Payer: Self-pay | Admitting: Internal Medicine

## 2022-06-04 ENCOUNTER — Encounter: Payer: Self-pay | Admitting: *Deleted

## 2022-06-04 ENCOUNTER — Telehealth: Payer: Self-pay | Admitting: *Deleted

## 2022-06-04 NOTE — Telephone Encounter (Signed)
S/w pt husband - offered first available - 1/17 at 12:30. Pt husband refused that slot, stated pt is scheduled for surgery 1/18 and need appt before then. Asked to see another provider for hospital f/u.  Sched w/ Ollen Gross 1/11 at 11am for hospital f/u

## 2022-06-04 NOTE — Telephone Encounter (Signed)
L/M FOR PT. TO C/B.

## 2022-06-04 NOTE — Telephone Encounter (Signed)
Pt spouse called staying that his wife Victoria Harris was in the hospital over the weekend. She wants to F/U with Dr. Nicki Reaper, however theres no openings available for me to place her in. She already has a appt 2/1, but as per pt, she wants to see her asap because this is very important.

## 2022-06-04 NOTE — Patient Outreach (Signed)
Care Coordination [facility] Note Transition Care Management Follow-up Telephone Call Date of discharge and from where: Saturday, 06/02/22 Victoria Harris; abdominal pain; pyelonephritis How have you been since you were released from the hospital? "I am doing overall good.  My husband and my family are taking great care of me.  I was not able to get the antibiotic until this morning because when they discharged me from the hospital my outpatient pharmacy was already closed for the rest of the weekend.  Before I took my first antibiotic today, I took my regular medicine and I am not sure what happened or why- but I had an episode of hives!  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  I took the antihistamines and that took care of it.  I did take the antibiotic afterward and have had no bad reaction from it.  Yes, we will have the DPR form filled out when we go to Dr. Bary Leriche office this Thursday-- thank you for telling us about that form" Any questions or concerns? Yes 1) reports episode of hives this morning after taking her regular maintenance medications: had not yet taken her first dose of antibiotic; she did take antibiotic subsequently after taking antihistamines, and reported no untoward reaction to antibiotic; she is not sure what causes her random hives episodes which she states has occurred twice since December 2023; advised patient to write down episodes of hives along with details of medication and food ingested prior, and to share with all care providers; advised for her to keep antihistamines in stock at her home 2) husband not currently listed on [facility] DPR form; provided education and explanation of purpose and value of DPR form and encouraged them to complete at time of scheduled PCP office visit 06/07/22  Items Reviewed: Did the pt receive and understand the discharge instructions provided? Yes  Medications obtained and verified? Yes  full medication review completed;  no concerns or discrepancies identified; patient's husband assists with medication management post- recent hospital discharge; patient/ husband deny medication concerns and questions Other? No  Any new allergies since your discharge?  Unsure-- reports a reaction of "hives" this morning that was resolved with Zyrtec and Pepcid antihistamine- states this occurred PRIOR to her taking the first dose of antibiotic Dietary orders reviewed? Yes Do you have support at home? Yes  husband and extended family members assisting with care needs as indicated post- hospital discharge; patient reports she is essentially independent in most daily self care activities  Home Care and Equipment/Supplies: Were home health services ordered? no If so, what is the name of the agency? N/A  Has the agency set up a time to come to the patient's home? not applicable Were any new equipment or medical supplies ordered?  No What is the name of the medical supply agency? N/A Were you able to get the supplies/equipment? not applicable Do you have any questions related to the use of the equipment or supplies? No N/A  Functional Questionnaire: (I = Independent and D = Dependent) ADLs: I  husband and extended family members assisting with care needs as indicated  Bathing/Dressing- I  husband and extended family members assisting with care needs as indicated  Meal Prep- I  husband and extended family members assisting with care needs as indicated  Eating- I  Maintaining continence- I  Transferring/Ambulation- I  Managing Meds- I  husband and extended family members assisting with care needs as indicated  Follow up  appointments reviewed:  PCP Hospital f/u appt confirmed? Yes  Scheduled to see PCP, Dr. Nicki Reaper on Saturday 06/02/22 @ 11:00 am [facility] f/u appt confirmed? No  Scheduled to see - on - @ - Are transportation arrangements needed? No  If their condition worsens, is the pt aware to call PCP or go to the  Emergency Dept.? Yes Was the patient provided with contact information for the PCP's office or ED? No; patient declined; reports already has contact information for all care providers Was to pt encouraged to call back with questions or concerns? Yes  SDOH assessments and interventions completed:   Yes SDOH Interventions Today    Flowsheet Row Most Recent Value  SDOH Interventions   Food Insecurity Interventions Intervention Not Indicated  Transportation Interventions Intervention Not Indicated      Care Coordination Interventions:  Completed triage around episode of hives earlier this morning; provided education around action plan for hives and value of keeping journal of hive-like episodes/ value of keeping antihistamines on hand and in stock at home; importance of updating care providers on hive episodes; provided education and explanation around value of completing DPR form     Encounter Outcome:  Pt. Visit Completed    Oneta Rack, RN, BSN, CCRN Alumnus RN CM Care Coordination/ Transition of [place] Management [phone]: direct office

## 2022-06-04 NOTE — Telephone Encounter (Signed)
Can work in Friday as we discussed.

## 2022-06-04 NOTE — Telephone Encounter (Signed)
Patient states she is returning call from Denita Lung, [place].  Luna Fuse is unavailable at time of call back, so I let patient know I will send her a message.

## 2022-06-05 NOTE — Telephone Encounter (Signed)
Pt sched for Friday at 1030

## 2022-06-07 ENCOUNTER — Ambulatory Visit: Payer: PPO | Admitting: Nurse Practitioner

## 2022-06-08 ENCOUNTER — Encounter: Payer: Self-pay | Admitting: Internal Medicine

## 2022-06-08 ENCOUNTER — Telehealth: Payer: Self-pay

## 2022-06-08 ENCOUNTER — Ambulatory Visit (INDEPENDENT_AMBULATORY_CARE_PROVIDER_SITE_OTHER): Payer: PPO | Admitting: Internal Medicine

## 2022-06-08 VITALS — BP 122/70 | HR 76 | Temp 98.5°F | Resp 13 | Ht 65.0 in | Wt 165.6 lb

## 2022-06-08 DIAGNOSIS — E876 Hypokalemia: Secondary | ICD-10-CM | POA: Diagnosis not present

## 2022-06-08 DIAGNOSIS — D696 Thrombocytopenia, unspecified: Secondary | ICD-10-CM

## 2022-06-08 DIAGNOSIS — R195 Other fecal abnormalities: Secondary | ICD-10-CM | POA: Diagnosis not present

## 2022-06-08 DIAGNOSIS — Z8673 Personal history of transient ischemic attack (TIA), and cerebral infarction without residual deficits: Secondary | ICD-10-CM | POA: Diagnosis not present

## 2022-06-08 DIAGNOSIS — E1159 Type 2 diabetes mellitus with other circulatory complications: Secondary | ICD-10-CM | POA: Diagnosis not present

## 2022-06-08 DIAGNOSIS — E7800 Pure hypercholesterolemia, unspecified: Secondary | ICD-10-CM | POA: Diagnosis not present

## 2022-06-08 DIAGNOSIS — N12 Tubulo-interstitial nephritis, not specified as acute or chronic: Secondary | ICD-10-CM | POA: Diagnosis not present

## 2022-06-08 DIAGNOSIS — F439 Reaction to severe stress, unspecified: Secondary | ICD-10-CM | POA: Diagnosis not present

## 2022-06-08 DIAGNOSIS — Z01818 Encounter for other preprocedural examination: Secondary | ICD-10-CM | POA: Diagnosis not present

## 2022-06-08 DIAGNOSIS — M5441 Lumbago with sciatica, right side: Secondary | ICD-10-CM

## 2022-06-08 LAB — URINALYSIS, ROUTINE W REFLEX MICROSCOPIC
Hgb urine dipstick: NEGATIVE
Ketones, ur: NEGATIVE
Leukocytes,Ua: NEGATIVE
Nitrite: NEGATIVE
Specific Gravity, Urine: 1.015 (ref 1.000–1.030)
Total Protein, Urine: NEGATIVE
Urine Glucose: NEGATIVE
Urobilinogen, UA: 0.2 (ref 0.0–1.0)
pH: 5 (ref 5.0–8.0)

## 2022-06-08 LAB — CBC WITH DIFFERENTIAL/PLATELET
Basophils Absolute: 0 10*3/uL (ref 0.0–0.1)
Basophils Relative: 0.4 % (ref 0.0–3.0)
Eosinophils Absolute: 0.1 10*3/uL (ref 0.0–0.7)
Eosinophils Relative: 1.4 % (ref 0.0–5.0)
HCT: 43.5 % (ref 36.0–46.0)
Hemoglobin: 14.6 g/dL (ref 12.0–15.0)
Lymphocytes Relative: 19.7 % (ref 12.0–46.0)
Lymphs Abs: 2.1 10*3/uL (ref 0.7–4.0)
MCHC: 33.6 g/dL (ref 30.0–36.0)
MCV: 94.7 fl (ref 78.0–100.0)
Monocytes Absolute: 0.8 10*3/uL (ref 0.1–1.0)
Monocytes Relative: 7.5 % (ref 3.0–12.0)
Neutro Abs: 7.6 10*3/uL (ref 1.4–7.7)
Neutrophils Relative %: 71 % (ref 43.0–77.0)
Platelets: 328 10*3/uL (ref 150.0–400.0)
RBC: 4.59 Mil/uL (ref 3.87–5.11)
RDW: 13.1 % (ref 11.5–15.5)
WBC: 10.7 10*3/uL — ABNORMAL HIGH (ref 4.0–10.5)

## 2022-06-08 LAB — BASIC METABOLIC PANEL
BUN: 17 mg/dL (ref 6–23)
CO2: 25 mEq/L (ref 19–32)
Calcium: 9.6 mg/dL (ref 8.4–10.5)
Chloride: 103 mEq/L (ref 96–112)
Creatinine, Ser: 0.68 mg/dL (ref 0.40–1.20)
GFR: 86.92 mL/min (ref >60.00)
Glucose, Bld: 146 mg/dL — ABNORMAL HIGH (ref 70–99)
Potassium: 4.2 mEq/L (ref 3.5–5.1)
Sodium: 140 mEq/L (ref 135–145)

## 2022-06-08 NOTE — Progress Notes (Unsigned)
Subjective:    [name], female    DOB: [birth year], 72 y.o.   MRN: [id number]  Patient here for  Chief Complaint  Patient presents with   Hospitalization Follow-up    HPI Here for hospital follow up. She is accompanied by her husband.  History obtained from both of them.  Admitted 05/31/22 - 06/02/22 - after presenting with abdominal pain.  Diagnosed with pyelonephritis.  Empiric abx started.  CT imaging - likely cystitis.  Treated with IV ceftriaxone.  Discharged on cefadroxil. Completed treatment for c.diff.  has now completed abx.  Feeling better.  No urinary symptoms.  Energy is improving.  Her main complaint is that of increased back pain.  Is really limiting her activity/mobility.  Needs surgery.  Surgery scheduled for 06/14/22.  Reports no chest pain or sob.  No cough or congestion.  Eating.  Appetite has improved.  No abdominal pain.  No urine change. Bowels improved.  No increased watery diarrhea.  Having some soft stool, but appears to be getting back to normal.    Past Medical History:  Diagnosis Date   Colon polyps    hyperplastic   Diabetes mellitus without complication ([place])    Diverticulosis    Hx of adenomatous colonic polyps    tubular adenoma   Hypercholesterolemia    Serrated adenoma of colon    Stroke (cerebrum) ([facility])    Thrombocytopenia ([place])    Uterus descensus    Past Surgical History:  Procedure Laterality Date   ABDOMINAL HYSTERECTOMY  2009   CHOLECYSTECTOMY  2007   COLONOSCOPY N/A 04/05/2015   Procedure: COLONOSCOPY;  Surgeon: Lollie Sails, MD;  Location: [facility] ENDOSCOPY;  Service: Endoscopy;  Laterality: N/A;   COLONOSCOPY WITH PROPOFOL N/A 10/09/2016   Procedure: COLONOSCOPY WITH PROPOFOL;  Surgeon: Lollie Sails, MD;  Location: [facility] ENDOSCOPY;  Service: Endoscopy;  Laterality: N/A;   COLONOSCOPY WITH PROPOFOL N/A 12/02/2019   Procedure: COLONOSCOPY WITH PROPOFOL;  Surgeon: Toledo, Benay Pike, MD;  Location: [facility] ENDOSCOPY;   Service: Gastroenterology;  Laterality: N/A;   DILATION AND CURETTAGE OF UTERUS  2001 and 2006   FOOT SURGERY  2007   TONSILLECTOMY  1960   TUBAL LIGATION     vocal cord cyst removed     Family History  Problem Relation Age of Onset   Colon cancer Mother    Diabetes Mother    Kidney disease Mother    Heart disease Mother    Prostate cancer Father    Diabetes Father    Glaucoma Father    Uterine cancer Maternal Aunt    Breast cancer Paternal Aunt        x2   Stroke Paternal Grandmother    Stroke Paternal Grandfather    Diabetes Brother    Diabetes Brother    Diabetes Other    Social History   Socioeconomic History   Marital status: Married    Spouse name: Not on file   Number of children: Not on file   Years of education: Not on file   Highest education level: Not on file  Occupational History   Not on file  Tobacco Use   Smoking status: Never   Smokeless tobacco: Never  Vaping Use   Vaping Use: Never used  Substance and Sexual Activity   Alcohol use: No    Alcohol/week: 0.0 standard drinks of alcohol   Drug use: No   Sexual activity: Not Currently  Other Topics Concern   Not  on file  Social History Narrative   Not on file   Social Determinants of Health   Financial Resource Strain: Low Risk  (05/02/2022)   Overall Financial Resource Strain (CARDIA)    Difficulty of Paying Living Expenses: Not hard at all  Food Insecurity: No Food Insecurity (06/04/2022)   Hunger Vital Sign    Worried About Running Out of Food in the Last Year: Never true    Ran Out of Food in the Last Year: Never true  Transportation Needs: No Transportation Needs (06/04/2022)   PRAPARE - Hydrologist (Medical): No    Lack of Transportation (Non-Medical): No  Physical Activity: Insufficiently Active (05/02/2022)   Exercise Vital Sign    Days of Exercise per Week: 2 days    Minutes of Exercise per Session: 30 min  Stress: No Stress Concern Present (05/02/2022)    [place]    Feeling of Stress : Not at all  Social Connections: Unknown (05/02/2022)   Social Connection and Isolation Panel [NHANES]    Frequency of Communication with Friends and Family: Not on file    Frequency of Social Gatherings with Friends and Family: Not on file    Attends Religious Services: Not on file    Active Member of Clubs or Organizations: Not on file    Attends Archivist Meetings: Not on file    Marital Status: Married     Review of Systems  Constitutional:  Negative for fever.       Appetite improved.  Energy improving.   HENT:  Negative for congestion and sinus pressure.   Respiratory:  Negative for cough, chest tightness and shortness of breath.   Cardiovascular:  Negative for chest pain, palpitations and leg swelling.  Gastrointestinal:  Negative for abdominal pain, constipation, nausea and vomiting.  Genitourinary:  Negative for difficulty urinating and dysuria.  Musculoskeletal:  Positive for back pain. Negative for joint swelling and myalgias.  Skin:  Negative for color change and rash.  Neurological:  Negative for dizziness and headaches.  Psychiatric/Behavioral:  Negative for agitation and dysphoric mood.        Objective:     BP 122/70 (BP Location: Left Arm, Patient Position: Sitting, Cuff Size: Small)   Pulse 76   Temp 98.5 F (36.9 C) (Temporal)   Resp 13   Ht '5\' 5"'$  (1.651 m)   Wt 165 lb 9.6 oz (75.1 kg)   SpO2 96%   BMI 27.56 kg/m  Wt Readings from Last 3 Encounters:  06/08/22 165 lb 9.6 oz (75.1 kg)  05/31/22 171 lb 15.3 oz (78 kg)  05/02/22 170 lb (77.1 kg)    Physical Exam Vitals reviewed.  Constitutional:      General: She is not in acute distress.    Appearance: Normal appearance.  HENT:     Head: Normocephalic and atraumatic.     Right Ear: External ear normal.     Left Ear: External ear normal.  Eyes:     General: No scleral icterus.        Right eye: No discharge.        Left eye: No discharge.     Conjunctiva/sclera: Conjunctivae normal.  Neck:     Thyroid: No thyromegaly.  Cardiovascular:     Rate and Rhythm: Normal rate and regular rhythm.  Pulmonary:     Effort: No respiratory distress.     Breath sounds: Normal breath sounds. No  wheezing.  Abdominal:     General: Bowel sounds are normal.     Palpations: Abdomen is soft.     Tenderness: There is no abdominal tenderness.  Musculoskeletal:        General: No swelling or tenderness.     Cervical back: Neck supple. No tenderness.  Lymphadenopathy:     Cervical: No cervical adenopathy.  Skin:    Findings: No erythema or rash.  Neurological:     Mental Status: She is alert.  Psychiatric:        Mood and Affect: Mood normal.        Behavior: Behavior normal.      Outpatient Encounter Medications as of 06/08/2022  Medication Sig   [EXPIRED] acidophilus (RISAQUAD) CAPS capsule Take 2 capsules by mouth daily for 5 days.   aspirin 81 MG tablet Take 81 mg by mouth daily.   Cholecalciferol 25 MCG (1000 UT) tablet Take 1,000 Units by mouth daily.    dorzolamide-timolol (COSOPT) 22.3-6.8 MG/ML ophthalmic solution Place 1 drop into both eyes 2 (two) times daily.   glucose blood (ONETOUCH VERIO) test strip USE AS INSTRUCTED TO CHECK BLOOD SUGARS DAILY. DX E11.9   HYDROcodone-acetaminophen (NORCO/VICODIN) 5-325 MG tablet One tablet q 6-8 hours prn increased pain.   latanoprost (XALATAN) 0.005 % ophthalmic solution 1 drop at bedtime.   Multiple Vitamins-Minerals (MULTIVITAMIN ADULT PO) Take by mouth daily.   ondansetron (ZOFRAN-ODT) 4 MG disintegrating tablet Take 1 tablet (4 mg total) by mouth every 8 (eight) hours as needed.   Probiotic Product (PROBIOTIC DAILY) CAPS Take 1 capsule by mouth daily.   rosuvastatin (CRESTOR) 5 MG tablet Take 1 tablet (5 mg total) by mouth daily.   traMADol (ULTRAM) 50 MG tablet Take 50 mg by mouth 4 (four) times daily.   No  facility-administered encounter medications on file as of 06/08/2022.     Medications reviewed and updated.   Lab Results  Component Value Date   WBC 10.7 (H) 06/08/2022   HGB 14.6 06/08/2022   HCT 43.5 06/08/2022   PLT 328.0 06/08/2022   GLUCOSE 146 (H) 06/08/2022   CHOL 177 03/26/2022   TRIG 175.0 (H) 03/26/2022   HDL 60.50 03/26/2022   LDLCALC 82 03/26/2022   ALT 24 05/06/2022   AST 24 05/06/2022   NA 140 06/08/2022   K 4.2 06/08/2022   CL 103 06/08/2022   CREATININE 0.68 06/08/2022   BUN 17 06/08/2022   CO2 25 06/08/2022   TSH 1.145 09/10/2021   HGBA1C 7.2 (H) 03/26/2022   MICROALBUR <0.7 04/24/2021    CT ABDOMEN PELVIS W CONTRAST  Result Date: 05/31/2022 CLINICAL DATA:  Abdominal pain, acute, nonlocalized Abdominal pain, chills, hypoxia, recent C. difficile, diarrhea again. EXAM: CT ABDOMEN AND PELVIS WITH CONTRAST TECHNIQUE: Multidetector CT imaging of the abdomen and pelvis was performed using the standard protocol following bolus administration of intravenous contrast. RADIATION DOSE REDUCTION: This exam was performed according to the departmental dose-optimization program which includes automated exposure control, adjustment of the mA and/or kV according to patient size and/or use of iterative reconstruction technique. CONTRAST:  16m OMNIPAQUE IOHEXOL 300 MG/ML  SOLN COMPARISON:  None Available. FINDINGS: Lower chest: No acute abnormality. Hepatobiliary: No focal liver abnormality is seen. Prior cholecystectomy with expected mild prominence of the biliary system. Pancreas: Unremarkable. No pancreatic ductal dilatation or surrounding inflammatory changes. Spleen: Normal in size without focal abnormality. Adrenals/Urinary Tract: Adrenal glands are unremarkable. No hydronephrosis or nephrolithiasis. The bladder is mildly distended. There is irregular  wall thickening at the base of the bladder. Stomach/Bowel: The stomach is within normal limits. There is no evidence of bowel  obstruction.The appendix is normal. Colonic diverticulosis. No evidence of acute diverticulitis. Vascular/Lymphatic: Scattered atherosclerosis. No AAA. No lymphadenopathy. Collateral vessels with splenorenal shunt noted. Reproductive: Prior hysterectomy. Other: No abdominal wall hernia or abnormality. No abdominopelvic ascites. Musculoskeletal: No acute osseous abnormality min. No suspicious osseous lesion. Multilevel degenerative changes of the spine. IMPRESSION: Irregular bladder wall thickening at the bladder base, correlate with urinalysis for possible cystitis. Colonic diverticulosis without evidence of acute diverticulitis. Normal appendix. Electronically Signed   By: Maurine Simmering M.D.   On: 05/31/2022 11:44   DG Chest Portable 1 View  Result Date: 05/31/2022 CLINICAL DATA:  Chills, nausea, diarrhea, abdominal pain, fatigue and hypoxia. EXAM: PORTABLE CHEST 1 VIEW COMPARISON:  09/10/2021 FINDINGS: Heart size and mediastinal contours are unremarkable. No pleural effusion or edema. No airspace opacities identified. Visualized osseous structures are unremarkable. IMPRESSION: No active disease. Electronically Signed   By: Kerby Moors M.D.   On: 05/31/2022 11:17       Assessment & Plan:  Type 2 diabetes mellitus with vascular disease ([place]) Assessment & Plan: Bowels better off metformin.  A1c elevated above goal.  Did not tolerate jardiance or rybelsus.  Has adjusted her diet.  Monitoring carb intake.  Stays active.  a1c improved on recent check - 7.2. Wants to remain off medication.  Continue low carb diet and exercise.  Follow met b and a1c.  Eye exam 11/23/21 - ok.   Orders: -     Basic metabolic panel  Thrombocytopenia ([place]) Assessment & Plan: Follow cbc.   Orders: -     CBC with Differential/Platelet  Pyelonephritis Assessment & Plan:  Diagnosed with pyelonephritis.  Empiric abx started.  CT imaging - likely cystitis.  Treated with IV ceftriaxone.  Discharged on cefadroxil. Feeling  better.  Eating better.  No urinary symptoms.  Surgery requested urine check to confirm clear prior to surgery.  Check urine and culture today.   Orders: -     Urine Culture -     Urinalysis, Routine w reflex microscopic  Hypercholesteremia Assessment & Plan: Continue crestor.  Low cholesterol diet and exercise.  Follow lipid panel and liver function tests.  Lab Results  Component Value Date   CHOL 177 03/26/2022   HDL 60.50 03/26/2022   LDLCALC 82 03/26/2022   TRIG 175.0 (H) 03/26/2022   CHOLHDL 3 03/26/2022      Hypokalemia Assessment & Plan: Recheck potassium today to confirm wnl.   Orders: -     Basic metabolic panel  History of CVA (cerebrovascular accident) Assessment & Plan: On daily aspirin.  Will confirm with surgery how long has to be off prior to surgery.  Try to limit amount of time off aspirin.     Loose stools Assessment & Plan: Has had an issue with loose stool previously.  Improved off metformin.  Colestid.  Recently treated for c.diff. bowels better.  No abdominal pain. No fever.  More formed.  Benefiber.  Has been taking probiotics.    Right-sided low back pain with right-sided sciatica, unspecified chronicity Assessment & Plan: Increased pain as outlined.  Seeing surgery. Planning surgery next week.     Stress Assessment & Plan: Increased stress related to above. Has good support.  Follow.    Pre-op evaluation Assessment & Plan: Recently hospitalized as outlined.  Doing better.  Feeling better.  Main complaint is increased back pain.  Limiting activity and mobility.  Desires to proceed with back surgery.  Recent EKG in hospital - SR with no acute ischemic changes.  No chest pain or sob reported.  No increased cough or congestion.  Recently treated for urine infection.  Symptoms have improved/resolved.  Surgery request urine check to confirm clear prior to surgery.  Also request met b check today.  Recently treated for c.diff.  bowels improved.   Discussed benefiber.  No abdominal pain.  No fever.  Plans to discuss with surgery regarding medications that need to be held prior to surgery.        Einar Pheasant, MD

## 2022-06-08 NOTE — Telephone Encounter (Signed)
Patient states she is returning our call.  I transferred call to Denita Lung, [place].

## 2022-06-09 ENCOUNTER — Encounter: Payer: Self-pay | Admitting: Internal Medicine

## 2022-06-09 DIAGNOSIS — Z01818 Encounter for other preprocedural examination: Secondary | ICD-10-CM | POA: Insufficient documentation

## 2022-06-09 LAB — URINE CULTURE
MICRO NUMBER:: 14424861
Result:: NO GROWTH
SPECIMEN QUALITY:: ADEQUATE

## 2022-06-09 NOTE — Assessment & Plan Note (Signed)
Diagnosed with pyelonephritis.  Empiric abx started.  CT imaging - likely cystitis.  Treated with IV ceftriaxone.  Discharged on cefadroxil. Feeling better.  Eating better.  No urinary symptoms.  Surgery requested urine check to confirm clear prior to surgery.  Check urine and culture today.

## 2022-06-09 NOTE — Assessment & Plan Note (Signed)
Follow cbc.  

## 2022-06-09 NOTE — Assessment & Plan Note (Signed)
Recheck potassium today to confirm wnl.

## 2022-06-09 NOTE — Assessment & Plan Note (Signed)
Increased stress related to above. Has good support.  Follow.

## 2022-06-09 NOTE — Assessment & Plan Note (Signed)
Has had an issue with loose stool previously.  Improved off metformin.  Colestid.  Recently treated for c.diff. bowels better.  No abdominal pain. No fever.  More formed.  Benefiber.  Has been taking probiotics.

## 2022-06-09 NOTE — Assessment & Plan Note (Signed)
Recently hospitalized as outlined.  Doing better.  Feeling better.  Main complaint is increased back pain.  Limiting activity and mobility.  Desires to proceed with back surgery.  Recent EKG in hospital - SR with no acute ischemic changes.  No chest pain or sob reported.  No increased cough or congestion.  Recently treated for urine infection.  Symptoms have improved/resolved.  Surgery request urine check to confirm clear prior to surgery.  Also request met b check today.  Recently treated for c.diff.  bowels improved.  Discussed benefiber.  No abdominal pain.  No fever.  Plans to discuss with surgery regarding medications that need to be held prior to surgery.

## 2022-06-09 NOTE — Assessment & Plan Note (Signed)
Continue crestor.  Low cholesterol diet and exercise.  Follow lipid panel and liver function tests.  Lab Results  Component Value Date   CHOL 177 03/26/2022   HDL 60.50 03/26/2022   LDLCALC 82 03/26/2022   TRIG 175.0 (H) 03/26/2022   CHOLHDL 3 03/26/2022

## 2022-06-09 NOTE — Assessment & Plan Note (Signed)
Increased pain as outlined.  Seeing surgery. Planning surgery next week.

## 2022-06-09 NOTE — Assessment & Plan Note (Signed)
Bowels better off metformin.  A1c elevated above goal.  Did not tolerate jardiance or rybelsus.  Has adjusted her diet.  Monitoring carb intake.  Stays active.  a1c improved on recent check - 7.2. Wants to remain off medication.  Continue low carb diet and exercise.  Follow met b and a1c.  Eye exam 11/23/21 - ok.

## 2022-06-09 NOTE — Assessment & Plan Note (Signed)
On daily aspirin.  Will confirm with surgery how long has to be off prior to surgery.  Try to limit amount of time off aspirin.

## 2022-06-11 NOTE — Telephone Encounter (Signed)
S/w pt - advised of urine results and culture pending

## 2022-06-11 NOTE — Telephone Encounter (Signed)
S/w pt - advised of urine results

## 2022-06-11 NOTE — Telephone Encounter (Signed)
Pt wants to know if her urine results have come back

## 2022-06-13 ENCOUNTER — Ambulatory Visit: Payer: PPO | Admitting: Internal Medicine

## 2022-06-14 DIAGNOSIS — M5127 Other intervertebral disc displacement, lumbosacral region: Secondary | ICD-10-CM | POA: Diagnosis not present

## 2022-06-14 DIAGNOSIS — M5126 Other intervertebral disc displacement, lumbar region: Secondary | ICD-10-CM | POA: Diagnosis not present

## 2022-06-14 DIAGNOSIS — M5117 Intervertebral disc disorders with radiculopathy, lumbosacral region: Secondary | ICD-10-CM | POA: Diagnosis not present

## 2022-06-14 DIAGNOSIS — M47816 Spondylosis without myelopathy or radiculopathy, lumbar region: Secondary | ICD-10-CM | POA: Diagnosis not present

## 2022-06-14 DIAGNOSIS — M5416 Radiculopathy, lumbar region: Secondary | ICD-10-CM | POA: Diagnosis not present

## 2022-06-14 DIAGNOSIS — M48061 Spinal stenosis, lumbar region without neurogenic claudication: Secondary | ICD-10-CM | POA: Diagnosis not present

## 2022-06-26 ENCOUNTER — Other Ambulatory Visit (INDEPENDENT_AMBULATORY_CARE_PROVIDER_SITE_OTHER): Payer: PPO

## 2022-06-26 DIAGNOSIS — E1159 Type 2 diabetes mellitus with other circulatory complications: Secondary | ICD-10-CM | POA: Diagnosis not present

## 2022-06-26 DIAGNOSIS — E7800 Pure hypercholesterolemia, unspecified: Secondary | ICD-10-CM

## 2022-06-26 LAB — LIPID PANEL
Cholesterol: 181 mg/dL (ref 0–200)
HDL: 54.5 mg/dL (ref >39.00)
NonHDL: 126.5
Total CHOL/HDL Ratio: 3
Triglycerides: 213 mg/dL — ABNORMAL HIGH (ref 0.0–149.0)
VLDL: 42.6 mg/dL — ABNORMAL HIGH (ref 0.0–40.0)

## 2022-06-26 LAB — HEPATIC FUNCTION PANEL
ALT: 25 U/L (ref 0–35)
AST: 20 U/L (ref 0–37)
Albumin: 4.1 g/dL (ref 3.5–5.2)
Alkaline Phosphatase: 81 U/L (ref 39–117)
Bilirubin, Direct: 0.1 mg/dL (ref 0.0–0.3)
Total Bilirubin: 0.6 mg/dL (ref 0.2–1.2)
Total Protein: 6.3 g/dL (ref 6.0–8.3)

## 2022-06-26 LAB — MICROALBUMIN / CREATININE URINE RATIO
Creatinine,U: 121.4 mg/dL
Microalb Creat Ratio: 1.6 mg/g (ref 0.0–30.0)
Microalb, Ur: 1.9 mg/dL (ref 0.0–1.9)

## 2022-06-26 LAB — BASIC METABOLIC PANEL
BUN: 18 mg/dL (ref 6–23)
CO2: 27 mEq/L (ref 19–32)
Calcium: 9.4 mg/dL (ref 8.4–10.5)
Chloride: 106 mEq/L (ref 96–112)
Creatinine, Ser: 0.48 mg/dL (ref 0.40–1.20)
GFR: 94.5 mL/min (ref >60.00)
Glucose, Bld: 166 mg/dL — ABNORMAL HIGH (ref 70–99)
Potassium: 4 mEq/L (ref 3.5–5.1)
Sodium: 141 mEq/L (ref 135–145)

## 2022-06-26 LAB — LDL CHOLESTEROL, DIRECT: Direct LDL: 93 mg/dL

## 2022-06-26 LAB — HEMOGLOBIN A1C: Hgb A1c MFr Bld: 7.1 % — ABNORMAL HIGH (ref 4.6–6.5)

## 2022-06-28 ENCOUNTER — Ambulatory Visit (INDEPENDENT_AMBULATORY_CARE_PROVIDER_SITE_OTHER): Payer: PPO | Admitting: Internal Medicine

## 2022-06-28 ENCOUNTER — Encounter: Payer: Self-pay | Admitting: Internal Medicine

## 2022-06-28 VITALS — BP 118/70 | HR 75 | Temp 98.3°F | Resp 16 | Ht 65.0 in | Wt 159.8 lb

## 2022-06-28 DIAGNOSIS — E7800 Pure hypercholesterolemia, unspecified: Secondary | ICD-10-CM | POA: Diagnosis not present

## 2022-06-28 DIAGNOSIS — E1159 Type 2 diabetes mellitus with other circulatory complications: Secondary | ICD-10-CM | POA: Diagnosis not present

## 2022-06-28 DIAGNOSIS — F439 Reaction to severe stress, unspecified: Secondary | ICD-10-CM

## 2022-06-28 DIAGNOSIS — D696 Thrombocytopenia, unspecified: Secondary | ICD-10-CM

## 2022-06-28 DIAGNOSIS — M5441 Lumbago with sciatica, right side: Secondary | ICD-10-CM | POA: Diagnosis not present

## 2022-06-28 LAB — HM DIABETES FOOT EXAM

## 2022-06-28 NOTE — Progress Notes (Signed)
Subjective:    [name], female    DOB: [birth year], 72 y.o.   MRN: [id number]  Patient here for  Chief Complaint  Patient presents with   Medical Management of Chronic Issues    HPI Here for follow up appt - f/u regarding her diabetes and hypercholesterolemia.  S/p recent back surgery.  Surgery went well.  Pain has improved.  She is gradually getting her strength back.  Had f/u with surgery - Tuesday.  Doing well.  Still cannot bend, etc.  Has f/u 07/26/22.  Eating.  No nausea or vomiting.  Bowels stable.  No abdominal pain reported.     Past Medical History:  Diagnosis Date   Colon polyps    hyperplastic   Diabetes mellitus without complication ([place])    Diverticulosis    Hx of adenomatous colonic polyps    tubular adenoma   Hypercholesterolemia    Serrated adenoma of colon    Stroke (cerebrum) ([place])    Thrombocytopenia ([place])    Uterus descensus    Past Surgical History:  Procedure Laterality Date   ABDOMINAL HYSTERECTOMY  2009   CHOLECYSTECTOMY  2007   COLONOSCOPY N/A 04/05/2015   Procedure: COLONOSCOPY;  Surgeon: Lollie Sails, MD;  Location: [facility] ENDOSCOPY;  Service: Endoscopy;  Laterality: N/A;   COLONOSCOPY WITH PROPOFOL N/A 10/09/2016   Procedure: COLONOSCOPY WITH PROPOFOL;  Surgeon: Lollie Sails, MD;  Location: [facility] ENDOSCOPY;  Service: Endoscopy;  Laterality: N/A;   COLONOSCOPY WITH PROPOFOL N/A 12/02/2019   Procedure: COLONOSCOPY WITH PROPOFOL;  Surgeon: Toledo, Benay Pike, MD;  Location: [facility] ENDOSCOPY;  Service: Gastroenterology;  Laterality: N/A;   DILATION AND CURETTAGE OF UTERUS  2001 and 2006   FOOT SURGERY  2007   TONSILLECTOMY  1960   TUBAL LIGATION     vocal cord cyst removed     Family History  Problem Relation Age of Onset   Colon cancer Mother    Diabetes Mother    Kidney disease Mother    Heart disease Mother    Prostate cancer Father    Diabetes Father    Glaucoma Father    Uterine cancer Maternal Aunt     Breast cancer Paternal Aunt        x2   Stroke Paternal Grandmother    Stroke Paternal Grandfather    Diabetes Brother    Diabetes Brother    Diabetes Other    Social History   Socioeconomic History   Marital status: Married    Spouse name: Not on file   Number of children: Not on file   Years of education: Not on file   Highest education level: Not on file  Occupational History   Not on file  Tobacco Use   Smoking status: Never   Smokeless tobacco: Never  Vaping Use   Vaping Use: Never used  Substance and Sexual Activity   Alcohol use: No    Alcohol/week: 0.0 standard drinks of alcohol   Drug use: No   Sexual activity: Not Currently  Other Topics Concern   Not on file  Social History Narrative   Not on file   Social Determinants of Health   Financial Resource Strain: Low Risk  (05/02/2022)   Overall Financial Resource Strain (CARDIA)    Difficulty of Paying Living Expenses: Not hard at all  Food Insecurity: No Food Insecurity (06/04/2022)   Hunger Vital Sign    Worried About Running Out of Food in the Last Year: Never  true    Ran Out of Food in the Last Year: Never true  Transportation Needs: No Transportation Needs (06/04/2022)   PRAPARE - Hydrologist (Medical): No    Lack of Transportation (Non-Medical): No  Physical Activity: Insufficiently Active (05/02/2022)   Exercise Vital Sign    Days of Exercise per Week: 2 days    Minutes of Exercise per Session: 30 min  Stress: No Stress Concern Present (05/02/2022)   [place]    Feeling of Stress : Not at all  Social Connections: Unknown (05/02/2022)   Social Connection and Isolation Panel [NHANES]    Frequency of Communication with Friends and Family: Not on file    Frequency of Social Gatherings with Friends and Family: Not on file    Attends Religious Services: Not on file    Active Member of Clubs or Organizations: Not on  file    Attends Archivist Meetings: Not on file    Marital Status: Married     Review of Systems  Constitutional:  Negative for appetite change and unexpected weight change.  HENT:  Negative for congestion and sinus pressure.   Respiratory:  Negative for cough, chest tightness and shortness of breath.   Cardiovascular:  Negative for chest pain, palpitations and leg swelling.  Gastrointestinal:  Negative for abdominal pain, nausea and vomiting.  Genitourinary:  Negative for difficulty urinating and dysuria.  Musculoskeletal:  Positive for back pain. Negative for myalgias.       Back is better s/p surgery.   Skin:  Negative for color change and rash.  Neurological:  Negative for dizziness and headaches.  Psychiatric/Behavioral:  Negative for agitation and dysphoric mood.        Objective:     BP 118/70   Pulse 75   Temp 98.3 F (36.8 C)   Resp 16   Ht '5\' 5"'$  (1.651 m)   Wt 159 lb 12.8 oz (72.5 kg)   SpO2 98%   BMI 26.59 kg/m  Wt Readings from Last 3 Encounters:  06/28/22 159 lb 12.8 oz (72.5 kg)  06/08/22 165 lb 9.6 oz (75.1 kg)  05/31/22 171 lb 15.3 oz (78 kg)    Physical Exam Vitals reviewed.  Constitutional:      General: She is not in acute distress.    Appearance: Normal appearance.  HENT:     Head: Normocephalic and atraumatic.     Right Ear: External ear normal.     Left Ear: External ear normal.  Eyes:     General: No scleral icterus.       Right eye: No discharge.        Left eye: No discharge.     Conjunctiva/sclera: Conjunctivae normal.  Neck:     Thyroid: No thyromegaly.  Cardiovascular:     Rate and Rhythm: Normal rate and regular rhythm.  Pulmonary:     Effort: No respiratory distress.     Breath sounds: Normal breath sounds. No wheezing.  Abdominal:     General: Bowel sounds are normal.     Palpations: Abdomen is soft.     Tenderness: There is no abdominal tenderness.  Musculoskeletal:        General: No swelling or tenderness.      Cervical back: Neck supple. No tenderness.  Lymphadenopathy:     Cervical: No cervical adenopathy.  Skin:    Findings: No erythema or rash.     Comments:  Healing incision site - lower back.   Neurological:     Mental Status: She is alert.  Psychiatric:        Mood and Affect: Mood normal.        Behavior: Behavior normal.      Outpatient Encounter Medications as of 06/28/2022  Medication Sig   aspirin 81 MG tablet Take 81 mg by mouth daily.   Cholecalciferol 25 MCG (1000 UT) tablet Take 1,000 Units by mouth daily.    dorzolamide-timolol (COSOPT) 22.3-6.8 MG/ML ophthalmic solution Place 1 drop into both eyes 2 (two) times daily.   glucose blood (ONETOUCH VERIO) test strip USE AS INSTRUCTED TO CHECK BLOOD SUGARS DAILY. DX E11.9   latanoprost (XALATAN) 0.005 % ophthalmic solution 1 drop at bedtime.   Multiple Vitamins-Minerals (MULTIVITAMIN ADULT PO) Take by mouth daily.   Probiotic Product (PROBIOTIC DAILY) CAPS Take 1 capsule by mouth daily.   rosuvastatin (CRESTOR) 5 MG tablet Take 1 tablet (5 mg total) by mouth daily.   [DISCONTINUED] HYDROcodone-acetaminophen (NORCO/VICODIN) 5-325 MG tablet One tablet q 6-8 hours prn increased pain.   [DISCONTINUED] ondansetron (ZOFRAN-ODT) 4 MG disintegrating tablet Take 1 tablet (4 mg total) by mouth every 8 (eight) hours as needed.   [DISCONTINUED] traMADol (ULTRAM) 50 MG tablet Take 50 mg by mouth 4 (four) times daily.   No facility-administered encounter medications on file as of 06/28/2022.     Lab Results  Component Value Date   WBC 10.7 (H) 06/08/2022   HGB 14.6 06/08/2022   HCT 43.5 06/08/2022   PLT 328.0 06/08/2022   GLUCOSE 166 (H) 06/26/2022   CHOL 181 06/26/2022   TRIG 213.0 (H) 06/26/2022   HDL 54.50 06/26/2022   LDLDIRECT 93.0 06/26/2022   LDLCALC 82 03/26/2022   ALT 25 06/26/2022   AST 20 06/26/2022   NA 141 06/26/2022   K 4.0 06/26/2022   CL 106 06/26/2022   CREATININE 0.48 06/26/2022   BUN 18 06/26/2022   CO2 27  06/26/2022   TSH 1.145 09/10/2021   HGBA1C 7.1 (H) 06/26/2022   MICROALBUR 1.9 06/26/2022    CT ABDOMEN PELVIS W CONTRAST  Result Date: 05/31/2022 CLINICAL DATA:  Abdominal pain, acute, nonlocalized Abdominal pain, chills, hypoxia, recent C. difficile, diarrhea again. EXAM: CT ABDOMEN AND PELVIS WITH CONTRAST TECHNIQUE: Multidetector CT imaging of the abdomen and pelvis was performed using the standard protocol following bolus administration of intravenous contrast. RADIATION DOSE REDUCTION: This exam was performed according to the departmental dose-optimization program which includes automated exposure control, adjustment of the mA and/or kV according to patient size and/or use of iterative reconstruction technique. CONTRAST:  40m OMNIPAQUE IOHEXOL 300 MG/ML  SOLN COMPARISON:  None Available. FINDINGS: Lower chest: No acute abnormality. Hepatobiliary: No focal liver abnormality is seen. Prior cholecystectomy with expected mild prominence of the biliary system. Pancreas: Unremarkable. No pancreatic ductal dilatation or surrounding inflammatory changes. Spleen: Normal in size without focal abnormality. Adrenals/Urinary Tract: Adrenal glands are unremarkable. No hydronephrosis or nephrolithiasis. The bladder is mildly distended. There is irregular wall thickening at the base of the bladder. Stomach/Bowel: The stomach is within normal limits. There is no evidence of bowel obstruction.The appendix is normal. Colonic diverticulosis. No evidence of acute diverticulitis. Vascular/Lymphatic: Scattered atherosclerosis. No AAA. No lymphadenopathy. Collateral vessels with splenorenal shunt noted. Reproductive: Prior hysterectomy. Other: No abdominal wall hernia or abnormality. No abdominopelvic ascites. Musculoskeletal: No acute osseous abnormality min. No suspicious osseous lesion. Multilevel degenerative changes of the spine. IMPRESSION: Irregular bladder wall thickening at the  bladder base, correlate with urinalysis  for possible cystitis. Colonic diverticulosis without evidence of acute diverticulitis. Normal appendix. Electronically Signed   By: Maurine Simmering M.D.   On: 05/31/2022 11:44   DG Chest Portable 1 View  Result Date: 05/31/2022 CLINICAL DATA:  Chills, nausea, diarrhea, abdominal pain, fatigue and hypoxia. EXAM: PORTABLE CHEST 1 VIEW COMPARISON:  09/10/2021 FINDINGS: Heart size and mediastinal contours are unremarkable. No pleural effusion or edema. No airspace opacities identified. Visualized osseous structures are unremarkable. IMPRESSION: No active disease. Electronically Signed   By: Kerby Moors M.D.   On: 05/31/2022 11:17       Assessment & Plan:  Type 2 diabetes mellitus with vascular disease ([place]) Assessment & Plan: Bowels better off metformin.  Did not tolerate jardiance or rybelsus.  Has adjusted her diet.  Monitoring carb intake.  a1c improved on recent check - 7.1. Wants to remain off medication.  Continue low carb diet and exercise.  Follow met b and a1c.  Eye exam 11/23/21 - ok.   Orders: -     Hemoglobin A1c; Future  Hypercholesteremia Assessment & Plan: Continue crestor.  Low cholesterol diet and exercise.  Follow lipid panel and liver function tests.  Lab Results  Component Value Date   CHOL 181 06/26/2022   HDL 54.50 06/26/2022   LDLCALC 82 03/26/2022   LDLDIRECT 93.0 06/26/2022   TRIG 213.0 (H) 06/26/2022   CHOLHDL 3 06/26/2022     Orders: -     Lipid panel; Future -     Hepatic function panel; Future -     Basic metabolic panel; Future  Right-sided low back pain with right-sided sciatica, unspecified chronicity Assessment & Plan: S/p lumbar decompressive surgery - Deri Fuelling. (05/2022).  Doing well.  Energy and strength gradually improving.  Follow.    Stress Assessment & Plan: Increased stress related to above. Has good support.  Follow.    Thrombocytopenia ([place]) Assessment & Plan: Follow cbc.       Einar Pheasant, MD

## 2022-07-01 ENCOUNTER — Encounter: Payer: Self-pay | Admitting: Internal Medicine

## 2022-07-01 NOTE — Assessment & Plan Note (Signed)
S/p lumbar decompressive surgery - Deri Fuelling. (05/2022).  Doing well.  Energy and strength gradually improving.  Follow.

## 2022-07-01 NOTE — Assessment & Plan Note (Signed)
Increased stress related to above. Has good support.  Follow.

## 2022-07-01 NOTE — Assessment & Plan Note (Signed)
Continue crestor.  Low cholesterol diet and exercise.  Follow lipid panel and liver function tests.  Lab Results  Component Value Date   CHOL 181 06/26/2022   HDL 54.50 06/26/2022   LDLCALC 82 03/26/2022   LDLDIRECT 93.0 06/26/2022   TRIG 213.0 (H) 06/26/2022   CHOLHDL 3 06/26/2022

## 2022-07-01 NOTE — Assessment & Plan Note (Signed)
Bowels better off metformin.  Did not tolerate jardiance or rybelsus.  Has adjusted her diet.  Monitoring carb intake.  a1c improved on recent check - 7.1. Wants to remain off medication.  Continue low carb diet and exercise.  Follow met b and a1c.  Eye exam 11/23/21 - ok.

## 2022-07-01 NOTE — Assessment & Plan Note (Signed)
Follow cbc.  

## 2022-07-17 ENCOUNTER — Telehealth: Payer: Self-pay | Admitting: Internal Medicine

## 2022-07-17 NOTE — Telephone Encounter (Signed)
Positive for covid yesterday Symptoms started Sunday night  Fever yesterday- none today  Sinus congestion Fatigue Able to eat and drink A little weakness but she has not gotten her strength back from having back surgery so nothing acute as far as weakness.   Denies sob, chest tightness, etc.   Did not necessarily feel like she needs to be seen at this time but was wondering if there was anything else over the counter she could use or should be using for the congestion. They have nasal spray at home and she did use tylenol to treat the fever but that has now resolved. Patient says she just feels like she has a head cold and husband has now lost his smell but has not tested for covid at this time.

## 2022-07-17 NOTE — Telephone Encounter (Signed)
Patient and husband aware of below. Patient would like to hold on antiviral medication for now. Will try OTC meds first and if does not help or she changes her mind, will let us know. They will call with update.

## 2022-07-17 NOTE — Telephone Encounter (Signed)
If sinus congestion - saline nasal spray - flush nose 2x/day.  Nasacort nasal spray - 2 sprays each nostril one time per day.  Do this in the evening.  Robitussin DM twice a day as needed for cough and congestion.  Is she interested in oral antiviral medication?  If so, 5 day window to start.  Can schedule virtual visit.  Needs to call with update.

## 2022-07-17 NOTE — Telephone Encounter (Signed)
Pt spouse called in staying that pt tested positive for Covid yesterday and they would like to know what they can do or take at the moment?? As per spouse, pt been laying on her back for 8 weeks due to a surgery she had. Any question, call [phone]

## 2022-07-19 NOTE — Telephone Encounter (Signed)
FYI for you. 

## 2022-07-19 NOTE — Telephone Encounter (Signed)
Please call and thank him for the update.  Let us know if need anything.

## 2022-07-19 NOTE — Telephone Encounter (Signed)
Patient's husband called and patient is doing better. Just a little weak. Husband wanted to Thank Dr Nicki Reaper for all her help she gave them.

## 2022-07-30 DIAGNOSIS — M6281 Muscle weakness (generalized): Secondary | ICD-10-CM | POA: Diagnosis not present

## 2022-07-30 DIAGNOSIS — M5459 Other low back pain: Secondary | ICD-10-CM | POA: Diagnosis not present

## 2022-08-01 NOTE — Progress Notes (Unsigned)
[facility]   Subjective:     Chief Complaint: Pessary fitting Victoria Harris is a 72 y.o. female is here for pessary fitting.)  History of Present Illness: Victoria Harris is a 72 y.o. female with stage III pelvic organ prolapse and OAB who presents today for a pessary fitting.    Past Medical History: Patient  has a past medical history of Colon polyps, Diabetes mellitus without complication ([place]), Diverticulosis, adenomatous colonic polyps, Hypercholesterolemia, Serrated adenoma of colon, Stroke (cerebrum) ([place]), Thrombocytopenia ([place]), and Uterus descensus.   Past Surgical History: She  has a past surgical history that includes Cholecystectomy (2007); Tonsillectomy (1960); Abdominal hysterectomy (2009); Foot surgery (2007); Dilation and curettage of uterus (2001 and 2006); vocal cord cyst removed; Colonoscopy (N/A, 04/05/2015); Tubal ligation; Colonoscopy with propofol (N/A, 10/09/2016); and Colonoscopy with propofol (N/A, 12/02/2019).   Medications: She has a current medication list which includes the following prescription(s): aspirin, cholecalciferol, dorzolamide-timolol, estradiol, onetouch verio, latanoprost, multiple vitamin, probiotic daily, and rosuvastatin.   Allergies: Patient is allergic to glucophage xr [metformin], jardiance [empagliflozin], and tylenol [acetaminophen].   Social History: Patient  reports that she has never smoked. She has never used smokeless tobacco. She reports that she does not drink alcohol and does not use drugs.      Objective:    BP 137/85   Pulse 67  Gen: No apparent distress, A&O x 3. Pelvic Exam: Normal external female genitalia; Bartholin's and Skene's glands normal in appearance; urethral meatus with caruncle, no urethral masses or discharge.   A size 2 3/4 short stem gellhorn pessary was fitted. It was comfortable, stayed in place with valsalva and was an appropriate size on examination, with one finger  fitting between the pessary and the vaginal walls. We tied a string to it and the patient demonstrated proper removal and replacement.    Assessment/Plan:    Assessment: [name] is a 72 y.o. with stage III pelvic organ prolapse and OAB who presents for a pessary fitting. Plan: She was fitted with a 2 3/4in short stem gellhorn pessary. She will keep the pessary in place until next visit. She will use estrogen.   For her vaginal atrophy she will use estrogen cream nightly for two weeks then twice a week after.   The Myrbetriq was too expensive for her and she was unable to continue on it. We discussed that anticholinergic medications are not a good fit for her based on age and the [place] may be just as expensive or more so than the Myrbetriq was. She is open to trying Pumpkin seed extract to see if this helps her OAB as she is getting up 3 times a night to urinate.   Follow-up in 3 weeks for a pessary check or sooner as needed.  All questions were answered.    Berton Mount, NP

## 2022-08-02 ENCOUNTER — Ambulatory Visit (INDEPENDENT_AMBULATORY_CARE_PROVIDER_SITE_OTHER): Payer: PPO | Admitting: Obstetrics and Gynecology

## 2022-08-02 ENCOUNTER — Encounter: Payer: Self-pay | Admitting: Obstetrics and Gynecology

## 2022-08-02 VITALS — BP 137/85 | HR 67

## 2022-08-02 DIAGNOSIS — N993 Prolapse of vaginal vault after hysterectomy: Secondary | ICD-10-CM

## 2022-08-02 DIAGNOSIS — N952 Postmenopausal atrophic vaginitis: Secondary | ICD-10-CM

## 2022-08-02 DIAGNOSIS — N8110 Cystocele, unspecified: Secondary | ICD-10-CM

## 2022-08-02 DIAGNOSIS — N3281 Overactive bladder: Secondary | ICD-10-CM

## 2022-08-02 MED ORDER — ESTRADIOL 0.1 MG/GM VA CREA: 0.5000 g | TOPICAL_CREAM | VAGINAL | 11 refills | Status: DC

## 2022-08-02 NOTE — Patient Instructions (Addendum)
You can try pumpkin seed extract for your overactive bladder as well. Up to 5gm daily can help. Pictures below. The '1000mg'$  capsules I would start with 2 a day.   Please use the estrogen cream nightly for 2 weeks  After the first two weeks use the cream twice a week. Use a blueberry sized amount on the fingertip and lotion it into the vagina as far as you can, around the opening and around the clitoris and urethra.

## 2022-08-03 DIAGNOSIS — M6281 Muscle weakness (generalized): Secondary | ICD-10-CM | POA: Diagnosis not present

## 2022-08-03 DIAGNOSIS — M5459 Other low back pain: Secondary | ICD-10-CM | POA: Diagnosis not present

## 2022-08-06 ENCOUNTER — Encounter: Payer: Self-pay | Admitting: Obstetrics and Gynecology

## 2022-08-06 ENCOUNTER — Ambulatory Visit (INDEPENDENT_AMBULATORY_CARE_PROVIDER_SITE_OTHER): Payer: PPO | Admitting: Obstetrics and Gynecology

## 2022-08-06 VITALS — BP 134/78 | HR 65

## 2022-08-06 DIAGNOSIS — N952 Postmenopausal atrophic vaginitis: Secondary | ICD-10-CM | POA: Diagnosis not present

## 2022-08-06 DIAGNOSIS — N8110 Cystocele, unspecified: Secondary | ICD-10-CM

## 2022-08-06 DIAGNOSIS — N993 Prolapse of vaginal vault after hysterectomy: Secondary | ICD-10-CM

## 2022-08-06 NOTE — Progress Notes (Signed)
[facility]   Subjective:     Chief Complaint:  Chief Complaint  Patient presents with   Pessary Check    Victoria Harris is a 72 y.o. female is here due to her pessary coming out.   History of Present Illness: Victoria Harris is a 72 y.o. female with stage III pelvic organ prolapse who presents for a pessary check. She is using a size 2 3/4in short stem gellhorn pessary. The pessary dislodged with a bowel movement on Friday.   Past Medical History: Patient  has a past medical history of Colon polyps, Diabetes mellitus without complication ([place]), Diverticulosis, adenomatous colonic polyps, Hypercholesterolemia, Serrated adenoma of colon, Stroke (cerebrum) ([place]), Thrombocytopenia ([place]), and Uterus descensus.   Past Surgical History: She  has a past surgical history that includes Cholecystectomy (2007); Tonsillectomy (1960); Abdominal hysterectomy (2009); Foot surgery (2007); Dilation and curettage of uterus (2001 and 2006); vocal cord cyst removed; Colonoscopy (N/A, 04/05/2015); Tubal ligation; Colonoscopy with propofol (N/A, 10/09/2016); and Colonoscopy with propofol (N/A, 12/02/2019).   Medications: She has a current medication list which includes the following prescription(s): aspirin, cholecalciferol, dorzolamide-timolol, estradiol, onetouch verio, latanoprost, multiple vitamin, probiotic daily, and rosuvastatin.   Allergies: Patient is allergic to glucophage xr [metformin], jardiance [empagliflozin], and tylenol [acetaminophen].   Social History: Patient  reports that she has never smoked. She has never used smokeless tobacco. She reports that she does not drink alcohol and does not use drugs.      Objective:    Physical Exam: BP 134/78   Pulse 65  Gen: No apparent distress, A&O x 3. Detailed Urogynecologic Evaluation:  Pelvic Exam: Normal external female genitalia; Bartholin's and Skene's glands normal in appearance; urethral meatus with caruncle,  no urethral masses or discharge. The pessary was noted to be dislodged. Patient changed to a 3in long stem Gellhorn (Lot [id number]). It fit the patient well and she reports it is well supported.   Attempted a size #5 Cube pessary and there was still tissue coming around the pessary.     Assessment/Plan:    Assessment: Victoria Harris is a 72 y.o. with stage III pelvic organ prolapse here for a pessary check. She is doing well.  Plan: She will keep the pessary in place until next visit. She will continue to use estrogen. She will follow-up in 4 weeks for a pessary check or sooner as needed.   All questions were answered.

## 2022-08-06 NOTE — Patient Instructions (Signed)
Use estrogen every night for 2 weeks and then twice weekly.

## 2022-08-14 DIAGNOSIS — H401123 Primary open-angle glaucoma, left eye, severe stage: Secondary | ICD-10-CM | POA: Diagnosis not present

## 2022-08-14 DIAGNOSIS — M6281 Muscle weakness (generalized): Secondary | ICD-10-CM | POA: Diagnosis not present

## 2022-08-14 DIAGNOSIS — M5459 Other low back pain: Secondary | ICD-10-CM | POA: Diagnosis not present

## 2022-08-16 DIAGNOSIS — M5459 Other low back pain: Secondary | ICD-10-CM | POA: Diagnosis not present

## 2022-08-16 DIAGNOSIS — M6281 Muscle weakness (generalized): Secondary | ICD-10-CM | POA: Diagnosis not present

## 2022-08-21 DIAGNOSIS — H401111 Primary open-angle glaucoma, right eye, mild stage: Secondary | ICD-10-CM | POA: Diagnosis not present

## 2022-08-21 DIAGNOSIS — H401123 Primary open-angle glaucoma, left eye, severe stage: Secondary | ICD-10-CM | POA: Diagnosis not present

## 2022-08-21 DIAGNOSIS — E119 Type 2 diabetes mellitus without complications: Secondary | ICD-10-CM | POA: Diagnosis not present

## 2022-08-21 DIAGNOSIS — H2513 Age-related nuclear cataract, bilateral: Secondary | ICD-10-CM | POA: Diagnosis not present

## 2022-08-21 LAB — HM DIABETES EYE EXAM

## 2022-08-23 ENCOUNTER — Ambulatory Visit: Payer: PPO | Admitting: Podiatry

## 2022-08-23 ENCOUNTER — Encounter: Payer: Self-pay | Admitting: Podiatry

## 2022-08-23 DIAGNOSIS — B351 Tinea unguium: Secondary | ICD-10-CM | POA: Diagnosis not present

## 2022-08-23 DIAGNOSIS — M79674 Pain in right toe(s): Secondary | ICD-10-CM | POA: Diagnosis not present

## 2022-08-23 DIAGNOSIS — E119 Type 2 diabetes mellitus without complications: Secondary | ICD-10-CM

## 2022-08-23 NOTE — Progress Notes (Signed)
This patient returns to my office for at risk foot care.  This patient requires this care by a professional since this patient will be at risk due to having diabetes and throbocytopenia..    This patient is unable to cut nails herself since the patient cannot reach her nails.These nails are painful walking and wearing shoes.  This patient presents for at risk foot care today. ? ?General Appearance  Alert, conversant and in no acute stress. ? ?Vascular  Dorsalis pedis and posterior tibial  pulses are palpable  bilaterally.  Capillary return is within normal limits  bilaterally. Temperature is within normal limits  bilaterally. ? ?Neurologic  Senn-Weinstein monofilament wire test within normal limits  bilaterally. Muscle power within normal limits bilaterally. ? ?Nails Thick disfigured discolored nails with subungual debris  from hallux to fifth toes bilaterally. No evidence of bacterial infection or drainage bilaterally. ? ?Orthopedic  No limitations of motion  feet .  No crepitus or effusions noted.  No bony pathology or digital deformities noted. Plantar flexed 2,3 right foot. ? ?Skin  normotropic skin  noted bilaterally.  No signs of infections or ulcers noted.   Asymptomatic porokeratosis sub 2 right foot. ? ?Onychomycosis  Pain in right toes  Pain in left toes ? ?Consent was obtained for treatment procedures.   Mechanical debridement of nails 1-5  bilaterally performed with a nail nipper.  Filed with dremel without incident.  ? ? ?Return office visit  4 months                   Told patient to return for periodic foot care and evaluation due to potential at risk complications. ? ? ?Dejuana Weist DPM  ?

## 2022-08-24 DIAGNOSIS — M6281 Muscle weakness (generalized): Secondary | ICD-10-CM | POA: Diagnosis not present

## 2022-08-24 DIAGNOSIS — M5459 Other low back pain: Secondary | ICD-10-CM | POA: Diagnosis not present

## 2022-08-30 ENCOUNTER — Ambulatory Visit: Payer: PPO | Admitting: Obstetrics and Gynecology

## 2022-08-30 DIAGNOSIS — M6281 Muscle weakness (generalized): Secondary | ICD-10-CM | POA: Diagnosis not present

## 2022-08-30 DIAGNOSIS — M5459 Other low back pain: Secondary | ICD-10-CM | POA: Diagnosis not present

## 2022-09-10 NOTE — Progress Notes (Unsigned)
[facility]   Subjective:     Chief Complaint: No chief complaint on file.  History of Present Illness: Parnell Elder is a 72 y.o. female with stage III pelvic organ prolapse who presents for a pessary check. She is using a size 3in long stem gellhorn pessary. The patient reports she has some tissue that seems to be coming around the pessary. She {ACTION; IS/IS CLE:[id number]} using vaginal estrogen. She denies vaginal bleeding.  Past Medical History: Patient  has a past medical history of Colon polyps, Diabetes mellitus without complication (HCC), Diverticulosis, adenomatous colonic polyps, Hypercholesterolemia, Serrated adenoma of colon, Stroke (cerebrum) (HCC), Thrombocytopenia (HCC), and Uterus descensus.   Past Surgical History: She  has a past surgical history that includes Cholecystectomy (2007); Tonsillectomy (1960); Abdominal hysterectomy (2009); Foot surgery (2007); Dilation and curettage of uterus (2001 and 2006); vocal cord cyst removed; Colonoscopy (N/A, 04/05/2015); Tubal ligation; Colonoscopy with propofol (N/A, 10/09/2016); and Colonoscopy with propofol (N/A, 12/02/2019).   Medications: She has a current medication list which includes the following prescription(s): aspirin, cholecalciferol, dorzolamide-timolol, estradiol, onetouch verio, latanoprost, multiple vitamin, probiotic daily, and rosuvastatin.   Allergies: Patient is allergic to glucophage xr [metformin], jardiance [empagliflozin], and tylenol [acetaminophen].   Social History: Patient  reports that she has never smoked. She has never used smokeless tobacco. She reports that she does not drink alcohol and does not use drugs.      Objective:    Physical Exam: There were no vitals taken for this visit. Gen: No apparent distress, A&O x 3. Detailed Urogynecologic Evaluation:  Pelvic Exam: Normal external female genitalia; Bartholin's and Skene's glands normal in appearance; urethral meatus  {urethra:24773}, no urethral masses or discharge. The pessary was noted to be {in place:24774}. It was removed and cleaned. Speculum exam revealed {vaginal lesions:24775} in the vagina. The pessary was replaced. It was comfortable to the patient and fit well.       No data to display          Laboratory Results: Urine dipstick shows: {ua dip:315374::"negative for all components"}.    Assessment/Plan:    Assessment: Ms. Kelner is a 72 y.o. with {PFD symptoms:24771} here for a pessary check. She is doing well.  Plan: She will {pessary plan:24776}. She will continue to use {lubricant:24777}. She will follow-up in *** {days/wks/mos/yrs:310907} for a pessary check or sooner as needed.  All questions were answered.   Time Spent:

## 2022-09-12 ENCOUNTER — Ambulatory Visit (INDEPENDENT_AMBULATORY_CARE_PROVIDER_SITE_OTHER): Payer: PPO | Admitting: Obstetrics and Gynecology

## 2022-09-12 ENCOUNTER — Ambulatory Visit: Payer: PPO | Admitting: Obstetrics and Gynecology

## 2022-09-12 ENCOUNTER — Encounter: Payer: Self-pay | Admitting: Obstetrics and Gynecology

## 2022-09-12 VITALS — BP 152/92 | HR 62

## 2022-09-12 DIAGNOSIS — Z4689 Encounter for fitting and adjustment of other specified devices: Secondary | ICD-10-CM

## 2022-09-12 DIAGNOSIS — R350 Frequency of micturition: Secondary | ICD-10-CM

## 2022-09-12 DIAGNOSIS — N3281 Overactive bladder: Secondary | ICD-10-CM | POA: Diagnosis not present

## 2022-09-12 DIAGNOSIS — N993 Prolapse of vaginal vault after hysterectomy: Secondary | ICD-10-CM

## 2022-09-12 DIAGNOSIS — N8110 Cystocele, unspecified: Secondary | ICD-10-CM

## 2022-09-12 MED ORDER — VIBEGRON 75 MG PO TABS: 75.0000 mg | ORAL_TABLET | Freq: Every day | ORAL | 5 refills | Status: DC

## 2022-09-17 ENCOUNTER — Encounter: Payer: Self-pay | Admitting: Obstetrics and Gynecology

## 2022-09-17 ENCOUNTER — Ambulatory Visit (INDEPENDENT_AMBULATORY_CARE_PROVIDER_SITE_OTHER): Payer: PPO | Admitting: Obstetrics and Gynecology

## 2022-09-17 ENCOUNTER — Telehealth: Payer: Self-pay | Admitting: Obstetrics and Gynecology

## 2022-09-17 VITALS — BP 142/90 | HR 71

## 2022-09-17 DIAGNOSIS — Z4689 Encounter for fitting and adjustment of other specified devices: Secondary | ICD-10-CM

## 2022-09-17 DIAGNOSIS — N993 Prolapse of vaginal vault after hysterectomy: Secondary | ICD-10-CM

## 2022-09-17 DIAGNOSIS — N952 Postmenopausal atrophic vaginitis: Secondary | ICD-10-CM

## 2022-09-17 DIAGNOSIS — N8110 Cystocele, unspecified: Secondary | ICD-10-CM

## 2022-09-17 NOTE — Telephone Encounter (Signed)
Called and spoke to patient. Will have her come in and will change pessary. Will also discuss if she wants surgical options.

## 2022-09-17 NOTE — Progress Notes (Signed)
[facility]   Subjective:     Chief Complaint:  Chief Complaint  Patient presents with   Pessary Check   History of Present Illness: Victoria Harris is a 72 y.o. female with stage III pelvic organ prolapse who presents for a pessary check. She is using a size #6 cube pessary. The pessary has been causing her pain. She is using vaginal estrogen. She denies vaginal bleeding.  Past Medical History: Patient  has a past medical history of Colon polyps, Diabetes mellitus without complication, Diverticulosis, adenomatous colonic polyps, Hypercholesterolemia, Serrated adenoma of colon, Stroke (cerebrum), Thrombocytopenia, and Uterus descensus.   Past Surgical History: She  has a past surgical history that includes Cholecystectomy (2007); Tonsillectomy (1960); Abdominal hysterectomy (2009); Foot surgery (2007); Dilation and curettage of uterus (2001 and 2006); vocal cord cyst removed; Colonoscopy (N/A, 04/05/2015); Tubal ligation; Colonoscopy with propofol (N/A, 10/09/2016); and Colonoscopy with propofol (N/A, 12/02/2019).   Medications: She has a current medication list which includes the following prescription(s): aspirin, cholecalciferol, dorzolamide-timolol, estradiol, onetouch verio, latanoprost, multiple vitamin, probiotic daily, rosuvastatin, and vibegron.   Allergies: Patient is allergic to glucophage xr [metformin], jardiance [empagliflozin], and tylenol [acetaminophen].   Social History: Patient  reports that she has never smoked. She has never used smokeless tobacco. She reports that she does not drink alcohol and does not use drugs.      Objective:    Physical Exam: BP (!) 142/90   Pulse 71  Gen: No apparent distress, A&O x 3. Detailed Urogynecologic Evaluation:  Pelvic Exam: Normal external female genitalia; Bartholin's and Skene's glands normal in appearance; urethral meatus normal in appearance, no urethral masses or discharge. The pessary was noted to be  in place. It was removed and cleaned. Speculum exam revealed erythema in the vagina. There was one spot on bleeding on the anterior vaginal wall that a small spot of silver nitrate was applied to. The pessary was changed to 3in Long Stem Gellhorn (Lot [id number]) . It was comfortable to the patient and fit well. She was able to urinate and attempt a bowel movement comfortably.     Assessment/Plan:    Assessment: Victoria Harris is a 72 y.o. with stage III pelvic organ prolapse here for a pessary check. She is doing well.  Plan: She will keep the pessary in place until next visit. She will continue to use estrogen. She will follow-up in 4 weeks for a pessary check or sooner as needed.  All questions were answered.

## 2022-09-17 NOTE — Telephone Encounter (Signed)
Patient called today saying her pessary is giving her a hard time.  She can't sit, stand, or walk around well.  Just very uncomfortable.  Would like to discuss.

## 2022-09-20 DIAGNOSIS — L578 Other skin changes due to chronic exposure to nonionizing radiation: Secondary | ICD-10-CM | POA: Diagnosis not present

## 2022-09-20 DIAGNOSIS — L821 Other seborrheic keratosis: Secondary | ICD-10-CM | POA: Diagnosis not present

## 2022-09-20 DIAGNOSIS — L300 Nummular dermatitis: Secondary | ICD-10-CM | POA: Diagnosis not present

## 2022-09-20 DIAGNOSIS — Z872 Personal history of diseases of the skin and subcutaneous tissue: Secondary | ICD-10-CM | POA: Diagnosis not present

## 2022-09-20 DIAGNOSIS — Z86018 Personal history of other benign neoplasm: Secondary | ICD-10-CM | POA: Diagnosis not present

## 2022-09-20 DIAGNOSIS — L570 Actinic keratosis: Secondary | ICD-10-CM | POA: Diagnosis not present

## 2022-09-20 DIAGNOSIS — Z85828 Personal history of other malignant neoplasm of skin: Secondary | ICD-10-CM | POA: Diagnosis not present

## 2022-10-24 ENCOUNTER — Other Ambulatory Visit: Payer: Self-pay | Admitting: Obstetrics and Gynecology

## 2022-10-24 ENCOUNTER — Ambulatory Visit (INDEPENDENT_AMBULATORY_CARE_PROVIDER_SITE_OTHER): Payer: PPO | Admitting: Obstetrics and Gynecology

## 2022-10-24 ENCOUNTER — Encounter: Payer: Self-pay | Admitting: Obstetrics and Gynecology

## 2022-10-24 VITALS — BP 130/84 | HR 60

## 2022-10-24 DIAGNOSIS — N3281 Overactive bladder: Secondary | ICD-10-CM

## 2022-10-24 DIAGNOSIS — N952 Postmenopausal atrophic vaginitis: Secondary | ICD-10-CM

## 2022-10-24 DIAGNOSIS — Z4689 Encounter for fitting and adjustment of other specified devices: Secondary | ICD-10-CM

## 2022-10-24 DIAGNOSIS — N993 Prolapse of vaginal vault after hysterectomy: Secondary | ICD-10-CM | POA: Diagnosis not present

## 2022-10-24 DIAGNOSIS — N8110 Cystocele, unspecified: Secondary | ICD-10-CM

## 2022-10-24 MED ORDER — TROSPIUM CHLORIDE ER 60 MG PO CP24: 60.0000 mg | ORAL_CAPSULE | Freq: Every day | ORAL | 5 refills | Status: DC

## 2022-10-24 NOTE — Progress Notes (Signed)
[facility]   Subjective:     Chief Complaint:  Chief Complaint  Patient presents with   Follow-up    Victoria Harris is a 72 y.o. female is here for a pessary check.    History of Present Illness: Victoria Harris is a 72 y.o. female with stage III pelvic organ prolapse who presents for a pessary check. She is using a size 3in long stem gellhorn pessary. The pessary has been working well but she still feels like she has some of the prolapse coming around the pessary. She is using vaginal estrogen. She denies vaginal bleeding.  Past Medical History: Patient  has a past medical history of Colon polyps, Diabetes mellitus without complication (HCC), Diverticulosis, adenomatous colonic polyps, Hypercholesterolemia, Serrated adenoma of colon, Stroke (cerebrum) (HCC), Thrombocytopenia (HCC), and Uterus descensus.   Past Surgical History: She  has a past surgical history that includes Cholecystectomy (2007); Tonsillectomy (1960); Abdominal hysterectomy (2009); Foot surgery (2007); Dilation and curettage of uterus (2001 and 2006); vocal cord cyst removed; Colonoscopy (N/A, 04/05/2015); Tubal ligation; Colonoscopy with propofol (N/A, 10/09/2016); and Colonoscopy with propofol (N/A, 12/02/2019).   Medications: She has a current medication list which includes the following prescription(s): aspirin, cholecalciferol, dorzolamide-timolol, estradiol, onetouch verio, latanoprost, multiple vitamin, probiotic daily, rosuvastatin, and vibegron.   Allergies: Patient is allergic to glucophage xr [metformin], jardiance [empagliflozin], and tylenol [acetaminophen].   Social History: Patient  reports that she has never smoked. She has never used smokeless tobacco. She reports that she does not drink alcohol and does not use drugs.      Objective:    Physical Exam: BP 130/84   Pulse 60  Gen: No apparent distress, A&O x 3. Detailed Urogynecologic Evaluation:  Pelvic Exam: Normal  external female genitalia; Bartholin's and Skene's glands normal in appearance; urethral meatus normal in appearance, no urethral masses or discharge. The pessary was noted to be in place. It was removed and cleaned. Speculum exam revealed no lesions in the vagina. The pessary was replaced. It was comfortable to the patient and fit well.    Assessment/Plan:    Assessment: Ms. Heid is a 72 y.o. with stage III pelvic organ prolapse here for a pessary check. She is doing well.  Plan: She will keep the pessary in place until next visit. She will continue to use estrogen and Trimosan jelly. She will follow-up in 3 months for a pessary check or sooner as needed.   Reports she has tried [place] 75mg  daily and Myrebtriq 50mg  daily without relief of her OAB symptoms. Will try Trospium 60mg  ER daily taken in the evening to attempt to control her nocturia and OAB symptoms.   All questions were answered.

## 2022-10-26 ENCOUNTER — Other Ambulatory Visit: Payer: Self-pay | Admitting: Obstetrics and Gynecology

## 2022-10-26 ENCOUNTER — Other Ambulatory Visit (INDEPENDENT_AMBULATORY_CARE_PROVIDER_SITE_OTHER): Payer: PPO

## 2022-10-26 DIAGNOSIS — N3281 Overactive bladder: Secondary | ICD-10-CM

## 2022-10-26 DIAGNOSIS — E1159 Type 2 diabetes mellitus with other circulatory complications: Secondary | ICD-10-CM

## 2022-10-26 DIAGNOSIS — E7800 Pure hypercholesterolemia, unspecified: Secondary | ICD-10-CM | POA: Diagnosis not present

## 2022-10-26 LAB — BASIC METABOLIC PANEL
BUN: 16 mg/dL (ref 6–23)
CO2: 28 mEq/L (ref 19–32)
Calcium: 9.2 mg/dL (ref 8.4–10.5)
Chloride: 106 mEq/L (ref 96–112)
Creatinine, Ser: 0.64 mg/dL (ref 0.40–1.20)
GFR: 87.96 mL/min (ref >60.00)
Glucose, Bld: 194 mg/dL — ABNORMAL HIGH (ref 70–99)
Potassium: 4.1 mEq/L (ref 3.5–5.1)
Sodium: 139 mEq/L (ref 135–145)

## 2022-10-26 LAB — HEPATIC FUNCTION PANEL
ALT: 16 U/L (ref 0–35)
AST: 22 U/L (ref 0–37)
Albumin: 4.2 g/dL (ref 3.5–5.2)
Alkaline Phosphatase: 59 U/L (ref 39–117)
Bilirubin, Direct: 0.1 mg/dL (ref 0.0–0.3)
Total Bilirubin: 0.6 mg/dL (ref 0.2–1.2)
Total Protein: 6.6 g/dL (ref 6.0–8.3)

## 2022-10-26 LAB — LIPID PANEL
Cholesterol: 169 mg/dL (ref 0–200)
HDL: 61.8 mg/dL (ref >39.00)
LDL Cholesterol: 87 mg/dL (ref 0–99)
NonHDL: 106.89
Total CHOL/HDL Ratio: 3
Triglycerides: 97 mg/dL (ref 0.0–149.0)
VLDL: 19.4 mg/dL (ref 0.0–40.0)

## 2022-10-26 LAB — HEMOGLOBIN A1C: Hgb A1c MFr Bld: 8.1 % — ABNORMAL HIGH (ref 4.6–6.5)

## 2022-10-26 MED ORDER — TROSPIUM CHLORIDE 20 MG PO TABS: 20.0000 mg | ORAL_TABLET | Freq: Two times a day (BID) | ORAL | 5 refills | Status: DC

## 2022-10-26 NOTE — Progress Notes (Signed)
PA denied for Trospium 60mg  ER. Trospium 20mg  twice daily approved for patient.

## 2022-10-29 ENCOUNTER — Ambulatory Visit (INDEPENDENT_AMBULATORY_CARE_PROVIDER_SITE_OTHER): Payer: PPO | Admitting: Internal Medicine

## 2022-10-29 ENCOUNTER — Encounter: Payer: Self-pay | Admitting: Internal Medicine

## 2022-10-29 ENCOUNTER — Telehealth: Payer: Self-pay | Admitting: Internal Medicine

## 2022-10-29 VITALS — BP 114/72 | HR 76 | Temp 97.9°F | Resp 16 | Ht 65.0 in | Wt 167.6 lb

## 2022-10-29 DIAGNOSIS — E7800 Pure hypercholesterolemia, unspecified: Secondary | ICD-10-CM

## 2022-10-29 DIAGNOSIS — Z7984 Long term (current) use of oral hypoglycemic drugs: Secondary | ICD-10-CM | POA: Diagnosis not present

## 2022-10-29 DIAGNOSIS — Z86010 Personal history of colonic polyps: Secondary | ICD-10-CM | POA: Diagnosis not present

## 2022-10-29 DIAGNOSIS — E1159 Type 2 diabetes mellitus with other circulatory complications: Secondary | ICD-10-CM

## 2022-10-29 DIAGNOSIS — N8110 Cystocele, unspecified: Secondary | ICD-10-CM | POA: Diagnosis not present

## 2022-10-29 DIAGNOSIS — Z1231 Encounter for screening mammogram for malignant neoplasm of breast: Secondary | ICD-10-CM

## 2022-10-29 DIAGNOSIS — F439 Reaction to severe stress, unspecified: Secondary | ICD-10-CM | POA: Diagnosis not present

## 2022-10-29 DIAGNOSIS — D696 Thrombocytopenia, unspecified: Secondary | ICD-10-CM | POA: Diagnosis not present

## 2022-10-29 MED ORDER — GLIPIZIDE ER 2.5 MG PO TB24: 2.5000 mg | ORAL_TABLET | Freq: Every day | ORAL | 2 refills | Status: DC

## 2022-10-29 MED ORDER — ROSUVASTATIN CALCIUM 5 MG PO TABS: 5.0000 mg | ORAL_TABLET | Freq: Every day | ORAL | 3 refills | Status: DC

## 2022-10-29 NOTE — Telephone Encounter (Signed)
Pt called requesting to get a copy of her lab results mailed to her

## 2022-10-29 NOTE — Progress Notes (Signed)
Subjective:    [name], female    DOB: January 05, 1951, 72 y.o.   MRN: [id number]  Patient here for  Chief Complaint  Patient presents with   Medical Management of Chronic Issues    HPI Here for follow up appt - f/u regarding her diabetes and hypercholesterolemia.  S/p recent back surgery.  Surgery went well.  Recent labs - increased A1c.  Did not tolerate metformin, jardiance or rybelsus.  Discussed diet and exercise.  She has not been able to be as active.  Plans to watch diet.  Discussed treatment options.  Given intolerance to above, discussed starting low dose glipizide.  Discussed the need to eat regular meals and not go long periods without eating.  Would need to notify me if problems with low sugars.  No chest pain or sob reported. Has been seeing urogyn.  Pessary placed - having problems with pessary.  Continues f/u.  Has tried gemtesa and myrbetriq to see if helped with urinary symptoms.  Recent visit 10/24/22 - trial of trospium.     Past Medical History:  Diagnosis Date   Colon polyps    hyperplastic   Diabetes mellitus without complication (HCC)    Diverticulosis    Hx of adenomatous colonic polyps    tubular adenoma   Hypercholesterolemia    Serrated adenoma of colon    Stroke (cerebrum) (HCC)    Thrombocytopenia (HCC)    Uterus descensus    Past Surgical History:  Procedure Laterality Date   ABDOMINAL HYSTERECTOMY  2009   CHOLECYSTECTOMY  2007   COLONOSCOPY N/A 04/05/2015   Procedure: COLONOSCOPY;  Surgeon: Christena Deem, MD;  Location: [facility] ENDOSCOPY;  Service: Endoscopy;  Laterality: N/A;   COLONOSCOPY WITH PROPOFOL N/A 10/09/2016   Procedure: COLONOSCOPY WITH PROPOFOL;  Surgeon: Christena Deem, MD;  Location[facility] ENDOSCOPY;  Service: Endoscopy;  Laterality: N/A;   COLONOSCOPY WITH PROPOFOL N/A 12/02/2019   Procedure: COLONOSCOPY WITH PROPOFOL;  Surgeon: Toledo, Boykin Nearing, MD;  Location: [facility] ENDOSCOPY;  Service: Gastroenterology;   Laterality: N/A;   DILATION AND CURETTAGE OF UTERUS  2001 and 2006   FOOT SURGERY  2007   TONSILLECTOMY  1960   TUBAL LIGATION     vocal cord cyst removed     Family History  Problem Relation Age of Onset   Colon cancer Mother    Diabetes Mother    Kidney disease Mother    Heart disease Mother    Prostate cancer Father    Diabetes Father    Glaucoma Father    Uterine cancer Maternal Aunt    Breast cancer Paternal Aunt        x2   Stroke Paternal Grandmother    Stroke Paternal Grandfather    Diabetes Brother    Diabetes Brother    Diabetes Other    Social History   Socioeconomic History   Marital status: Married    Spouse name: Not on file   Number of children: Not on file   Years of education: Not on file   Highest education level: Not on file  Occupational History   Not on file  Tobacco Use   Smoking status: Never   Smokeless tobacco: Never  Vaping Use   Vaping Use: Never used  Substance and Sexual Activity   Alcohol use: No    Alcohol/week: 0.0 standard drinks of alcohol   Drug use: No   Sexual activity: Not Currently  Other Topics Concern   Not  on file  Social History Narrative   Not on file   Social Determinants of Health   Financial Resource Strain: Low Risk  (05/02/2022)   Overall Financial Resource Strain (CARDIA)    Difficulty of Paying Living Expenses: Not hard at all  Food Insecurity: No Food Insecurity (06/04/2022)   Hunger Vital Sign    Worried About Running Out of Food in the Last Year: Never true    Ran Out of Food in the Last Year: Never true  Transportation Needs: No Transportation Needs (06/04/2022)   PRAPARE - Administrator, Civil Service (Medical): No    Lack of Transportation (Non-Medical): No  Physical Activity: Insufficiently Active (05/02/2022)   Exercise Vital Sign    Days of Exercise per Week: 2 days    Minutes of Exercise per Session: 30 min  Stress: No Stress Concern Present (05/02/2022)   [employer] of  Occupational Health - Occupational Stress Questionnaire    Feeling of Stress : Not at all  Social Connections: Unknown (05/02/2022)   Social Connection and Isolation Panel [NHANES]    Frequency of Communication with Friends and Family: Not on file    Frequency of Social Gatherings with Friends and Family: Not on file    Attends Religious Services: Not on file    Active Member of Clubs or Organizations: Not on file    Attends Banker Meetings: Not on file    Marital Status: Married     Review of Systems  Constitutional:  Negative for appetite change and unexpected weight change.  HENT:  Negative for congestion and sinus pressure.   Respiratory:  Negative for cough, chest tightness and shortness of breath.   Cardiovascular:  Negative for chest pain, palpitations and leg swelling.  Gastrointestinal:  Negative for abdominal pain, diarrhea, nausea and vomiting.  Genitourinary:  Negative for difficulty urinating and dysuria.       Frequency/nocturia.   Musculoskeletal:  Negative for joint swelling and myalgias.  Skin:  Negative for color change and rash.  Neurological:  Negative for dizziness and headaches.  Psychiatric/Behavioral:  Negative for agitation and dysphoric mood.        Objective:     BP 114/72   Pulse 76   Temp 97.9 F (36.6 C)   Resp 16   Ht 5\' 5"  (1.651 m)   Wt 167 lb 9.6 oz (76 kg)   SpO2 98%   BMI 27.89 kg/m  Wt Readings from Last 3 Encounters:  10/29/22 167 lb 9.6 oz (76 kg)  06/28/22 159 lb 12.8 oz (72.5 kg)  06/08/22 165 lb 9.6 oz (75.1 kg)    Physical Exam Vitals reviewed.  Constitutional:      General: She is not in acute distress.    Appearance: Normal appearance.  HENT:     Head: Normocephalic and atraumatic.     Right Ear: External ear normal.     Left Ear: External ear normal.  Eyes:     General: No scleral icterus.       Right eye: No discharge.        Left eye: No discharge.     Conjunctiva/sclera: Conjunctivae normal.   Neck:     Thyroid: No thyromegaly.  Cardiovascular:     Rate and Rhythm: Normal rate and regular rhythm.  Pulmonary:     Effort: No respiratory distress.     Breath sounds: Normal breath sounds. No wheezing.  Abdominal:     General: Bowel sounds are normal.  Palpations: Abdomen is soft.     Tenderness: There is no abdominal tenderness.  Musculoskeletal:        General: No swelling or tenderness.     Cervical back: Neck supple. No tenderness.  Lymphadenopathy:     Cervical: No cervical adenopathy.  Skin:    Findings: No erythema or rash.  Neurological:     Mental Status: She is alert.  Psychiatric:        Mood and Affect: Mood normal.        Behavior: Behavior normal.      Outpatient Encounter Medications as of 10/29/2022  Medication Sig   glipiZIDE (GLUCOTROL XL) 2.5 MG 24 hr tablet Take 1 tablet (2.5 mg total) by mouth daily with breakfast.   aspirin 81 MG tablet Take 81 mg by mouth daily.   Cholecalciferol 25 MCG (1000 UT) tablet Take 1,000 Units by mouth daily.    dorzolamide-timolol (COSOPT) 22.3-6.8 MG/ML ophthalmic solution Place 1 drop into both eyes 2 (two) times daily.   estradiol (ESTRACE) 0.1 MG/GM vaginal cream Place 0.5 g vaginally 2 (two) times a week. Place 0.5g nightly for two weeks then twice a week after   glucose blood (ONETOUCH VERIO) test strip USE AS INSTRUCTED TO CHECK BLOOD SUGARS DAILY. DX E11.9   latanoprost (XALATAN) 0.005 % ophthalmic solution 1 drop at bedtime.   Multiple Vitamins-Minerals (MULTIVITAMIN ADULT PO) Take by mouth daily.   Probiotic Product (PROBIOTIC DAILY) CAPS Take 1 capsule by mouth daily.   rosuvastatin (CRESTOR) 5 MG tablet Take 1 tablet (5 mg total) by mouth daily.   trospium (SANCTURA) 20 MG tablet Take 1 tablet (20 mg total) by mouth 2 (two) times daily.   [DISCONTINUED] rosuvastatin (CRESTOR) 5 MG tablet Take 1 tablet (5 mg total) by mouth daily.   No facility-administered encounter medications on file as of 10/29/2022.      Lab Results  Component Value Date   WBC 10.7 (H) 06/08/2022   HGB 14.6 06/08/2022   HCT 43.5 06/08/2022   PLT 328.0 06/08/2022   GLUCOSE 194 (H) 10/26/2022   CHOL 169 10/26/2022   TRIG 97.0 10/26/2022   HDL 61.80 10/26/2022   LDLDIRECT 93.0 06/26/2022   LDLCALC 87 10/26/2022   ALT 16 10/26/2022   AST 22 10/26/2022   NA 139 10/26/2022   K 4.1 10/26/2022   CL 106 10/26/2022   CREATININE 0.64 10/26/2022   BUN 16 10/26/2022   CO2 28 10/26/2022   TSH 1.145 09/10/2021   HGBA1C 8.1 (H) 10/26/2022   MICROALBUR 1.9 06/26/2022    CT ABDOMEN PELVIS W CONTRAST  Result Date: 05/31/2022 CLINICAL DATA:  Abdominal pain, acute, nonlocalized Abdominal pain, chills, hypoxia, recent C. difficile, diarrhea again. EXAM: CT ABDOMEN AND PELVIS WITH CONTRAST TECHNIQUE: Multidetector CT imaging of the abdomen and pelvis was performed using the standard protocol following bolus administration of intravenous contrast. RADIATION DOSE REDUCTION: This exam was performed according to the departmental dose-optimization program which includes automated exposure control, adjustment of the mA and/or kV according to patient size and/or use of iterative reconstruction technique. CONTRAST:  80mL OMNIPAQUE IOHEXOL 300 MG/ML  SOLN COMPARISON:  None Available. FINDINGS: Lower chest: No acute abnormality. Hepatobiliary: No focal liver abnormality is seen. Prior cholecystectomy with expected mild prominence of the biliary system. Pancreas: Unremarkable. No pancreatic ductal dilatation or surrounding inflammatory changes. Spleen: Normal in size without focal abnormality. Adrenals/Urinary Tract: Adrenal glands are unremarkable. No hydronephrosis or nephrolithiasis. The bladder is mildly distended. There is irregular wall thickening  at the base of the bladder. Stomach/Bowel: The stomach is within normal limits. There is no evidence of bowel obstruction.The appendix is normal. Colonic diverticulosis. No evidence of acute  diverticulitis. Vascular/Lymphatic: Scattered atherosclerosis. No AAA. No lymphadenopathy. Collateral vessels with splenorenal shunt noted. Reproductive: Prior hysterectomy. Other: No abdominal wall hernia or abnormality. No abdominopelvic ascites. Musculoskeletal: No acute osseous abnormality min. No suspicious osseous lesion. Multilevel degenerative changes of the spine. IMPRESSION: Irregular bladder wall thickening at the bladder base, correlate with urinalysis for possible cystitis. Colonic diverticulosis without evidence of acute diverticulitis. Normal appendix. Electronically Signed   By: Caprice Renshaw M.D.   On: 05/31/2022 11:44   DG Chest Portable 1 View  Result Date: 05/31/2022 CLINICAL DATA:  Chills, nausea, diarrhea, abdominal pain, fatigue and hypoxia. EXAM: PORTABLE CHEST 1 VIEW COMPARISON:  09/10/2021 FINDINGS: Heart size and mediastinal contours are unremarkable. No pleural effusion or edema. No airspace opacities identified. Visualized osseous structures are unremarkable. IMPRESSION: No active disease. Electronically Signed   By: Signa Kell M.D.   On: 05/31/2022 11:17       Assessment & Plan:  Visit for screening mammogram -     3D Screening Mammogram, Left and Right; Future  Type 2 diabetes mellitus with vascular disease (HCC) Assessment & Plan: Bowels better off metformin.  Did not tolerate jardiance or rybelsus.  Discussed monitoring carb intake.  A1c increased on recent check.  Continue low carb diet and exercise.  Add glipizide XL 2.5mg  q day.  Discussed importance of eating regular meals and not going long periods without eating. Monitor for low sugars. Follow met b and a1c.  Eye exam 11/23/21 - ok.   Orders: -     Hemoglobin A1c; Future  Hypercholesteremia Assessment & Plan: Continue crestor.  Low cholesterol diet and exercise.  Follow lipid panel and liver function tests.  Lab Results  Component Value Date   CHOL 169 10/26/2022   HDL 61.80 10/26/2022   LDLCALC 87  10/26/2022   LDLDIRECT 93.0 06/26/2022   TRIG 97.0 10/26/2022   CHOLHDL 3 10/26/2022     Orders: -     Lipid panel; Future -     Hepatic function panel; Future -     Basic metabolic panel; Future -     TSH; Future  History of colonic polyps Assessment & Plan: Colonoscopy 11/2019 - diverticulosis.  Recommended f/u colonoscopy in 5 years.    Prolapse of female bladder, acquired Assessment & Plan: Seeing urogyn as outlined.  Trial of trospium.     Stress Assessment & Plan: Overall appears to be doing well. Has good support.  Follow.    Thrombocytopenia (HCC) Assessment & Plan: Follow cbc.    Other orders -     Rosuvastatin Calcium; Take 1 tablet (5 mg total) by mouth daily.  Dispense: 90 tablet; Refill: 3 -     glipiZIDE ER; Take 1 tablet (2.5 mg total) by mouth daily with breakfast.  Dispense: 30 tablet; Refill: 2     Dale Bella Villa, MD

## 2022-10-30 NOTE — Telephone Encounter (Signed)
Labs mailed

## 2022-11-04 ENCOUNTER — Encounter: Payer: Self-pay | Admitting: Internal Medicine

## 2022-11-04 NOTE — Assessment & Plan Note (Signed)
Continue crestor.  Low cholesterol diet and exercise.  Follow lipid panel and liver function tests.  Lab Results  Component Value Date   CHOL 169 10/26/2022   HDL 61.80 10/26/2022   LDLCALC 87 10/26/2022   LDLDIRECT 93.0 06/26/2022   TRIG 97.0 10/26/2022   CHOLHDL 3 10/26/2022

## 2022-11-04 NOTE — Assessment & Plan Note (Signed)
Bowels better off metformin.  Did not tolerate jardiance or rybelsus.  Discussed monitoring carb intake.  A1c increased on recent check.  Continue low carb diet and exercise.  Add glipizide XL 2.5mg  q day.  Discussed importance of eating regular meals and not going long periods without eating. Monitor for low sugars. Follow met b and a1c.  Eye exam 11/23/21 - ok.

## 2022-11-04 NOTE — Assessment & Plan Note (Signed)
Colonoscopy 11/2019 - diverticulosis.  Recommended f/u colonoscopy in 5 years.  

## 2022-11-04 NOTE — Assessment & Plan Note (Signed)
Seeing urogyn as outlined.  Trial of trospium.

## 2022-11-04 NOTE — Assessment & Plan Note (Signed)
Follow cbc.  

## 2022-11-04 NOTE — Assessment & Plan Note (Signed)
Overall appears to be doing well.  Has good support.  Follow.  

## 2022-11-22 ENCOUNTER — Encounter: Payer: Self-pay | Admitting: Podiatry

## 2022-11-22 ENCOUNTER — Ambulatory Visit: Payer: PPO | Admitting: Podiatry

## 2022-11-22 VITALS — BP 142/84 | HR 66

## 2022-11-22 DIAGNOSIS — Q828 Other specified congenital malformations of skin: Secondary | ICD-10-CM | POA: Diagnosis not present

## 2022-11-22 DIAGNOSIS — M79674 Pain in right toe(s): Secondary | ICD-10-CM

## 2022-11-22 DIAGNOSIS — B351 Tinea unguium: Secondary | ICD-10-CM

## 2022-11-22 DIAGNOSIS — E119 Type 2 diabetes mellitus without complications: Secondary | ICD-10-CM | POA: Diagnosis not present

## 2022-11-26 NOTE — Progress Notes (Signed)
  Subjective:  [name], female    DOB: [birth year],  MRN: [id number]  Victoria Harris presents to clinic today for painful porokeratotic lesion(s) right foot and painful mycotic toenails that limit ambulation. Painful toenails interfere with ambulation. Aggravating factors include wearing enclosed shoe gear. Pain is relieved with periodic professional debridement. Painful porokeratotic lesions are aggravated when weightbearing with and without shoegear. Pain is relieved with periodic professional debridement.  Chief Complaint  Patient presents with   Diabetes    "Do my pincer nails and trim the callus, if need be."  Dr. Dale [place]amba - 10/29/2022, A1c - 8.1, glucose today - 144 mg/dl   New problem(s): None.   PCP is Dale [place], MD.  Allergies  Allergen Reactions   Glucophage Xr [Metformin]     diarrhea   Jardiance [Empagliflozin]     Seen 09/07/21 on this day Stopped jardiance due to pt easily dehydrates at baseline and felt like this was making this worse and causing to feel like she was going to pass out or pass out    Tylenol [Acetaminophen] Hives    Review of Systems: Negative except as noted in the HPI.  Objective: No changes noted in today's physical examination. Vitals:   11/22/22 1137  BP: (!) 142/84  Pulse: [address] Victoria Harris is a pleasant 72 y.o. female WD, WN in NAD. AAO x 3.  Vascular Examination: Capillary refill time immediate b/l. Vascular status intact b/l with palpable pedal pulses. Pedal hair absent b/l. No pain with calf compression b/l. Skin temperature gradient WNL b/l. No cyanosis or clubbing b/l. No ischemia or gangrene noted b/l.   Neurological Examination: Sensation grossly intact b/l with 10 gram monofilament. Vibratory sensation intact b/l.   Dermatological Examination: Pedal skin with normal turgor, texture and tone b/l.  No open wounds. No interdigital macerations.   Toenails 2-5 and right great toe  b/l thick, discolored, elongated with subungual debris and pain on dorsal palpation.   Anonychia noted left great toe. Nailbed(s) epithelialized.  Porokeratotic lesion(s) submet head 2 right foot. No erythema, no edema, no drainage, no fluctuance.  Musculoskeletal Examination: Muscle strength 5/5 to all lower extremity muscle groups bilaterally. Plantarflexed metatarsal(s) 2nd metatarsal head right foot and 3rd metatarsal head right foot.  Radiographs: None  Last A1c:      Latest Ref Rng & Units 10/26/2022    8:41 AM 06/26/2022    8:39 AM 03/26/2022    8:18 AM  Hemoglobin A1C  Hemoglobin-A1c 4.6 - 6.5 % 8.1  7.1  7.2    Assessment/Plan: 1. Pain due to onychomycosis of toenail of right foot   2. Porokeratosis   3. Diabetes mellitus without complication (HCC)     -Patient was evaluated and treated. All patient's and/or POA's questions/concerns answered on today's visit. -Patient to continue soft, supportive shoe gear daily. -Toenails 1-5 b/l were debrided in length and girth with sterile nail nippers and dremel without iatrogenic bleeding.  -Porokeratotic lesion(s) submet head 2 right foot pared and enucleated with sterile currette without incident. Total number of lesions debrided=1. -Patient/POA to call should there be question/concern in the interim.   Return in about 3 months (around 02/22/2023).  Freddie Breech, DPM

## 2022-11-27 ENCOUNTER — Telehealth: Payer: Self-pay | Admitting: Obstetrics and Gynecology

## 2022-11-27 ENCOUNTER — Encounter: Payer: Self-pay | Admitting: Obstetrics and Gynecology

## 2022-11-27 NOTE — Telephone Encounter (Signed)
Patient had to remove the pessary since the Gellhorn got turned and was pressing to the side. She has tried many pessaries at this point and may now be considering surgical intervention. She has been taking the Trospium 20mg  x2 daily and reports she has not been able to tell a difference in her overactive bladder. Will plan to discuss possible surgical options at next visit and potentially bladder botox due to uncontrolled OAB symptoms.

## 2022-12-20 ENCOUNTER — Other Ambulatory Visit: Payer: Self-pay | Admitting: Internal Medicine

## 2022-12-24 ENCOUNTER — Ambulatory Visit
Admission: RE | Admit: 2022-12-24 | Discharge: 2022-12-24 | Disposition: A | Discharge status: Home / Self Care | Payer: PPO | Source: Ambulatory Visit | Attending: Internal Medicine | Admitting: Internal Medicine

## 2022-12-24 DIAGNOSIS — Z1231 Encounter for screening mammogram for malignant neoplasm of breast: Secondary | ICD-10-CM | POA: Insufficient documentation

## 2023-01-10 ENCOUNTER — Encounter (INDEPENDENT_AMBULATORY_CARE_PROVIDER_SITE_OTHER): Payer: Self-pay

## 2023-01-14 ENCOUNTER — Ambulatory Visit: Payer: PPO | Admitting: Obstetrics and Gynecology

## 2023-01-30 ENCOUNTER — Telehealth: Payer: Self-pay | Admitting: Internal Medicine

## 2023-01-30 NOTE — Telephone Encounter (Signed)
Patient need lab orders.

## 2023-01-30 NOTE — Telephone Encounter (Signed)
Future labs were ordered 10/29/22.

## 2023-02-04 ENCOUNTER — Ambulatory Visit: Payer: PPO | Admitting: Obstetrics and Gynecology

## 2023-02-04 ENCOUNTER — Encounter: Payer: Self-pay | Admitting: Obstetrics and Gynecology

## 2023-02-04 VITALS — BP 122/79 | HR 62

## 2023-02-04 DIAGNOSIS — N993 Prolapse of vaginal vault after hysterectomy: Secondary | ICD-10-CM

## 2023-02-04 DIAGNOSIS — N3281 Overactive bladder: Secondary | ICD-10-CM | POA: Diagnosis not present

## 2023-02-04 DIAGNOSIS — N8110 Cystocele, unspecified: Secondary | ICD-10-CM

## 2023-02-04 NOTE — Patient Instructions (Addendum)
Use the estrogen cream twice a week.   If you want to do the Prolapse support underwear they have them on [employer].  Pelvic Support Belt Uterus Support Belt Women's Brace   Let me know if things become more bothersome to you and we can discuss options.

## 2023-02-04 NOTE — Progress Notes (Signed)
[facility] Return Visit  SUBJECTIVE  History of Present Illness: Alailah Hyre is a 72 y.o. female seen in follow-up for POP. Plan at last visit was consider surgical options.   She reports she wants to have expectant management.   Past Medical History: Patient  has a past medical history of Colon polyps, Diabetes mellitus without complication (HCC), Diverticulosis, adenomatous colonic polyps, Hypercholesterolemia, Serrated adenoma of colon, Stroke (cerebrum) (HCC), Thrombocytopenia (HCC), and Uterus descensus.   Past Surgical History: She  has a past surgical history that includes Cholecystectomy (2007); Tonsillectomy (1960); Abdominal hysterectomy (2009); Foot surgery (2007); Dilation and curettage of uterus (2001 and 2006); vocal cord cyst removed; Colonoscopy (N/A, 04/05/2015); Tubal ligation; Colonoscopy with propofol (N/A, 10/09/2016); and Colonoscopy with propofol (N/A, 12/02/2019).   Medications: She has a current medication list which includes the following prescription(s): aspirin, cholecalciferol, dorzolamide-timolol, estradiol, glipizide, onetouch verio, latanoprost, multiple vitamin, probiotic daily, rosuvastatin, and trospium.   Allergies: Patient is allergic to glucophage xr [metformin], jardiance [empagliflozin], and tylenol [acetaminophen].   Social History: Patient  reports that she has never smoked. She has never used smokeless tobacco. She reports that she does not drink alcohol and does not use drugs.      OBJECTIVE     Physical Exam: Vitals:   02/04/23 1449  BP: 122/79  Pulse: 62   Gen: No apparent distress, A&O x 3.  Detailed Urogynecologic Evaluation:  Deferred.    ASSESSMENT AND PLAN    Ms. Wargel is a 72 y.o. with:  1. Overactive bladder   2. Vaginal vault prolapse after hysterectomy   3. Prolapse of anterior vaginal wall    Patient has tried Trospium and Gemtesa for her OAB symptoms. She reports she gets up x3 nightly and  that her PCP mentioned concerns for sleep apnea, but she does not wish to have a sleep study done at this time. She reports her symptoms are mostly manageable right now and she does not want another intervention.  For patient's prolapse symptoms, she reports it has been reduced a lot lately and she would prefer not to do any more pessaries or surgical planning.  She will let me know if this is bothering her more and we can plan an intervention if needed. As for now she will expectantly manage. We also discussed there are prolapse underwear she can use if she would like to invest in an external mechanism to assist with her prolapse during times of high exertion.   Patient to follow up PRN

## 2023-02-07 ENCOUNTER — Other Ambulatory Visit (INDEPENDENT_AMBULATORY_CARE_PROVIDER_SITE_OTHER): Payer: PPO

## 2023-02-07 DIAGNOSIS — E7800 Pure hypercholesterolemia, unspecified: Secondary | ICD-10-CM | POA: Diagnosis not present

## 2023-02-07 DIAGNOSIS — E1159 Type 2 diabetes mellitus with other circulatory complications: Secondary | ICD-10-CM | POA: Diagnosis not present

## 2023-02-07 LAB — HEMOGLOBIN A1C: Hgb A1c MFr Bld: 7.2 % — ABNORMAL HIGH (ref 4.6–6.5)

## 2023-02-07 LAB — TSH: TSH: 1.71 u[IU]/mL (ref 0.35–5.50)

## 2023-02-07 LAB — BASIC METABOLIC PANEL
BUN: 21 mg/dL (ref 6–23)
CO2: 28 meq/L (ref 19–32)
Calcium: 9.3 mg/dL (ref 8.4–10.5)
Chloride: 107 meq/L (ref 96–112)
Creatinine, Ser: 0.63 mg/dL (ref 0.40–1.20)
GFR: 88.12 mL/min (ref >60.00)
Glucose, Bld: 155 mg/dL — ABNORMAL HIGH (ref 70–99)
Potassium: 4 meq/L (ref 3.5–5.1)
Sodium: 142 meq/L (ref 135–145)

## 2023-02-07 LAB — LIPID PANEL
Cholesterol: 161 mg/dL (ref 0–200)
HDL: 64.3 mg/dL (ref >39.00)
LDL Cholesterol: 75 mg/dL (ref 0–99)
NonHDL: 96.33
Total CHOL/HDL Ratio: 2
Triglycerides: 106 mg/dL (ref 0.0–149.0)
VLDL: 21.2 mg/dL (ref 0.0–40.0)

## 2023-02-07 LAB — HEPATIC FUNCTION PANEL
ALT: 20 U/L (ref 0–35)
AST: 23 U/L (ref 0–37)
Albumin: 4 g/dL (ref 3.5–5.2)
Alkaline Phosphatase: 59 U/L (ref 39–117)
Bilirubin, Direct: 0.2 mg/dL (ref 0.0–0.3)
Total Bilirubin: 0.8 mg/dL (ref 0.2–1.2)
Total Protein: 6.3 g/dL (ref 6.0–8.3)

## 2023-02-11 ENCOUNTER — Encounter: Payer: Self-pay | Admitting: Internal Medicine

## 2023-02-11 ENCOUNTER — Ambulatory Visit (INDEPENDENT_AMBULATORY_CARE_PROVIDER_SITE_OTHER): Payer: PPO | Admitting: Internal Medicine

## 2023-02-11 VITALS — BP 124/72 | HR 67 | Temp 98.0°F | Ht 65.0 in | Wt 178.0 lb

## 2023-02-11 DIAGNOSIS — D696 Thrombocytopenia, unspecified: Secondary | ICD-10-CM

## 2023-02-11 DIAGNOSIS — Z0000 Encounter for general adult medical examination without abnormal findings: Secondary | ICD-10-CM

## 2023-02-11 DIAGNOSIS — Z86010 Personal history of colonic polyps: Secondary | ICD-10-CM

## 2023-02-11 DIAGNOSIS — F439 Reaction to severe stress, unspecified: Secondary | ICD-10-CM | POA: Diagnosis not present

## 2023-02-11 DIAGNOSIS — E7800 Pure hypercholesterolemia, unspecified: Secondary | ICD-10-CM

## 2023-02-11 DIAGNOSIS — Z8673 Personal history of transient ischemic attack (TIA), and cerebral infarction without residual deficits: Secondary | ICD-10-CM | POA: Diagnosis not present

## 2023-02-11 DIAGNOSIS — Z7984 Long term (current) use of oral hypoglycemic drugs: Secondary | ICD-10-CM | POA: Diagnosis not present

## 2023-02-11 DIAGNOSIS — E1159 Type 2 diabetes mellitus with other circulatory complications: Secondary | ICD-10-CM

## 2023-02-11 NOTE — Progress Notes (Signed)
Subjective:    [name], female    DOB: 10-Feb-1951, 72 y.o.   MRN: [id number]  Patient here for  Chief Complaint  Patient presents with   Annual Exam    HPI Here for a physical exam.  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 Previously tried trospium and gemtesa for OAB symptoms.  Desires no further intervention.  Have discussed possible sleep apnea.  Wanted to hold on further evaluation.  Recent A1c 7.2.  improved from last check.  On glipizide.  Had intolerance to jardiance, rybelsus and metformin.  Stays active.  No chest pain or sob reported.     Past Medical History:  Diagnosis Date   Colon polyps    hyperplastic   Diabetes mellitus without complication (HCC)    Diverticulosis    Hx of adenomatous colonic polyps    tubular adenoma   Hypercholesterolemia    Serrated adenoma of colon    Stroke (cerebrum) (HCC)    Thrombocytopenia (HCC)    Uterus descensus    Past Surgical History:  Procedure Laterality Date   ABDOMINAL HYSTERECTOMY  2009   CHOLECYSTECTOMY  2007   COLONOSCOPY N/A 04/05/2015   Procedure: COLONOSCOPY;  Surgeon: Christena Deem, MD;  Location: [facility] ENDOSCOPY;  Service: Endoscopy;  Laterality: N/A;   COLONOSCOPY WITH PROPOFOL N/A 10/09/2016   Procedure: COLONOSCOPY WITH PROPOFOL;  Surgeon: Christena Deem, MD;  Location[facility] ENDOSCOPY;  Service: Endoscopy;  Laterality: N/A;   COLONOSCOPY WITH PROPOFOL N/A 12/02/2019   Procedure: COLONOSCOPY WITH PROPOFOL;  Surgeon: Toledo, Boykin Nearing, MD;  Location: [facility] ENDOSCOPY;  Service: Gastroenterology;  Laterality: N/A;   DILATION AND CURETTAGE OF UTERUS  2001 and 2006   FOOT SURGERY  2007   TONSILLECTOMY  1960   TUBAL LIGATION     vocal cord cyst removed     Family History  Problem Relation Age of Onset   Colon cancer Mother    Diabetes Mother    Kidney disease Mother    Heart disease Mother    Prostate cancer Father    Diabetes Father    Glaucoma  Father    Uterine cancer Maternal Aunt    Breast cancer Paternal Aunt        x2   Stroke Paternal Grandmother    Stroke Paternal Grandfather    Diabetes Brother    Diabetes Brother    Diabetes Other    Social History   Socioeconomic History   Marital status: Married    Spouse name: Not on file   Number of children: Not on file   Years of education: Not on file   Highest education level: Not on file  Occupational History   Not on file  Tobacco Use   Smoking status: Never   Smokeless tobacco: Never  Vaping Use   Vaping status: Never Used  Substance and Sexual Activity   Alcohol use: No    Alcohol/week: 0.0 standard drinks of alcohol   Drug use: No   Sexual activity: Not Currently  Other Topics Concern   Not on file  Social History Narrative   Not on file   Social Determinants of Health   Financial Resource Strain: Low Risk  (05/02/2022)   Overall Financial Resource Strain (CARDIA)    Difficulty of Paying Living Expenses: Not hard at all  Food Insecurity: No Food Insecurity (06/04/2022)   Hunger Vital Sign    Worried About Running Out of Food in the Last  Year: Never true    Ran Out of Food in the Last Year: Never true  Transportation Needs: No Transportation Needs (06/04/2022)   PRAPARE - Administrator, Civil Service (Medical): No    Lack of Transportation (Non-Medical): No  Physical Activity: Insufficiently Active (05/02/2022)   Exercise Vital Sign    Days of Exercise per Week: 2 days    Minutes of Exercise per Session: 30 min  Stress: No Stress Concern Present (05/02/2022)   [employer] of Occupational Health - Occupational Stress Questionnaire    Feeling of Stress : Not at all  Social Connections: Unknown (05/02/2022)   Social Connection and Isolation Panel [NHANES]    Frequency of Communication with Friends and Family: Not on file    Frequency of Social Gatherings with Friends and Family: Not on file    Attends Religious Services: Not on file     Active Member of Clubs or Organizations: Not on file    Attends Banker Meetings: Not on file    Marital Status: Married     Review of Systems  Constitutional:  Negative for appetite change and unexpected weight change.  HENT:  Negative for congestion, sinus pressure and sore throat.   Eyes:  Negative for pain and visual disturbance.  Respiratory:  Negative for cough, chest tightness and shortness of breath.   Cardiovascular:  Negative for chest pain and palpitations.  Gastrointestinal:  Negative for abdominal pain, diarrhea, nausea and vomiting.  Genitourinary:  Negative for difficulty urinating and dysuria.  Musculoskeletal:  Negative for joint swelling and myalgias.  Skin:  Negative for color change and rash.  Neurological:  Negative for dizziness and headaches.  Hematological:  Negative for adenopathy. Does not bruise/bleed easily.  Psychiatric/Behavioral:  Negative for agitation and dysphoric mood.        Objective:     BP 124/72   Pulse 67   Temp 98 F (36.7 C) (Oral)   Ht 5\' 5"  (1.651 m)   Wt 178 lb (80.7 kg)   SpO2 95%   BMI 29.62 kg/m  Wt Readings from Last 3 Encounters:  02/11/23 178 lb (80.7 kg)  10/29/22 167 lb 9.6 oz (76 kg)  06/28/22 159 lb 12.8 oz (72.5 kg)    Physical Exam Vitals reviewed.  Constitutional:      General: She is not in acute distress.    Appearance: Normal appearance. She is well-developed.  HENT:     Head: Normocephalic and atraumatic.     Right Ear: External ear normal.     Left Ear: External ear normal.  Eyes:     General: No scleral icterus.       Right eye: No discharge.        Left eye: No discharge.     Conjunctiva/sclera: Conjunctivae normal.  Neck:     Thyroid: No thyromegaly.  Cardiovascular:     Rate and Rhythm: Normal rate and regular rhythm.  Pulmonary:     Effort: No tachypnea, accessory muscle usage or respiratory distress.     Breath sounds: Normal breath sounds. No decreased breath sounds or  wheezing.  Chest:  Breasts:    Right: No inverted nipple, mass, nipple discharge or tenderness (no axillary adenopathy).     Left: No inverted nipple, mass, nipple discharge or tenderness (no axilarry adenopathy).  Abdominal:     General: Bowel sounds are normal.     Palpations: Abdomen is soft.     Tenderness: There is no abdominal tenderness.  Musculoskeletal:        General: No swelling or tenderness.     Cervical back: Neck supple.  Lymphadenopathy:     Cervical: No cervical adenopathy.  Skin:    Findings: No erythema or rash.  Neurological:     Mental Status: She is alert and oriented to person, place, and time.  Psychiatric:        Mood and Affect: Mood normal.        Behavior: Behavior normal.      Outpatient Encounter Medications as of 02/11/2023  Medication Sig   aspirin 81 MG tablet Take 81 mg by mouth daily.   Cholecalciferol 25 MCG (1000 UT) tablet Take 1,000 Units by mouth daily.    dorzolamide-timolol (COSOPT) 22.3-6.8 MG/ML ophthalmic solution Place 1 drop into both eyes 2 (two) times daily.   estradiol (ESTRACE) 0.1 MG/GM vaginal cream Place 0.5 g vaginally 2 (two) times a week. Place 0.5g nightly for two weeks then twice a week after   glipiZIDE (GLUCOTROL XL) 2.5 MG 24 hr tablet TAKE 1 TABLET BY MOUTH DAILY WITH BREAKFAST.   glucose blood (ONETOUCH VERIO) test strip USE AS INSTRUCTED TO CHECK BLOOD SUGARS DAILY. DX E11.9   latanoprost (XALATAN) 0.005 % ophthalmic solution 1 drop at bedtime.   Multiple Vitamins-Minerals (MULTIVITAMIN ADULT PO) Take by mouth daily.   Probiotic Product (PROBIOTIC DAILY) CAPS Take 1 capsule by mouth daily.   rosuvastatin (CRESTOR) 5 MG tablet Take 1 tablet (5 mg total) by mouth daily.   trospium (SANCTURA) 20 MG tablet Take 1 tablet (20 mg total) by mouth 2 (two) times daily.   No facility-administered encounter medications on file as of 02/11/2023.     Lab Results  Component Value Date   WBC 10.7 (H) 06/08/2022   HGB 14.6  06/08/2022   HCT 43.5 06/08/2022   PLT 328.0 06/08/2022   GLUCOSE 155 (H) 02/07/2023   CHOL 161 02/07/2023   TRIG 106.0 02/07/2023   HDL 64.30 02/07/2023   LDLDIRECT 93.0 06/26/2022   LDLCALC 75 02/07/2023   ALT 20 02/07/2023   AST 23 02/07/2023   NA 142 02/07/2023   K 4.0 02/07/2023   CL 107 02/07/2023   CREATININE 0.63 02/07/2023   BUN 21 02/07/2023   CO2 28 02/07/2023   TSH 1.71 02/07/2023   HGBA1C 7.2 (H) 02/07/2023   MICROALBUR 1.9 06/26/2022    MM 3D SCREENING MAMMOGRAM BILATERAL BREAST  Result Date: 12/25/2022 CLINICAL DATA:  Screening. EXAM: DIGITAL SCREENING BILATERAL MAMMOGRAM WITH TOMOSYNTHESIS AND CAD TECHNIQUE: Bilateral screening digital craniocaudal and mediolateral oblique mammograms were obtained. Bilateral screening digital breast tomosynthesis was performed. The images were evaluated with computer-aided detection. COMPARISON:  Previous exam(s). ACR Breast Density Category b: There are scattered areas of fibroglandular density. FINDINGS: There are no findings suspicious for malignancy. IMPRESSION: No mammographic evidence of malignancy. A result letter of this screening mammogram will be mailed directly to the patient. RECOMMENDATION: Screening mammogram in one year. (Code:SM-B-01Y) BI-RADS CATEGORY  1: Negative. Electronically Signed   By: Sherian Rein M.D.   On: 12/25/2022 11:40       Assessment & Plan:  Health care maintenance Assessment & Plan: Physical today 02/11/23.  Mammogram 12/24/22 - birads I.  Colonoscopy 12/02/19 - moderate diverticulosis otherwise normal.  Bone density 12/03/19 - normal.    History of colonic polyps Assessment & Plan: Colonoscopy 11/2019 - diverticulosis.  Recommended f/u colonoscopy in 5 years.    History of CVA (cerebrovascular accident) Assessment & Plan: Continue  daily aspirin.     Hypercholesteremia Assessment & Plan: Continue crestor.  Low cholesterol diet and exercise.  Follow lipid panel and liver function tests.  Lab  Results  Component Value Date   CHOL 161 02/07/2023   HDL 64.30 02/07/2023   LDLCALC 75 02/07/2023   LDLDIRECT 93.0 06/26/2022   TRIG 106.0 02/07/2023   CHOLHDL 2 02/07/2023      Type 2 diabetes mellitus with vascular disease (HCC) Assessment & Plan: Bowels better off metformin.  Did not tolerate jardiance or rybelsus.  Have discussed monitoring carb intake.  A1c improved.  Continue low carb diet and exercise.  On glipizide XL 2.5mg  q day.   Follow met b and a1c.     Thrombocytopenia (HCC) Assessment & Plan: Follow cbc.    Stress Assessment & Plan: Overall appears to be doing well. Has good support.  Follow.       Dale Gordon, MD

## 2023-02-11 NOTE — Assessment & Plan Note (Addendum)
Physical today 02/11/23.  Mammogram 12/24/22 - birads I.  Colonoscopy 12/02/19 - moderate diverticulosis otherwise normal.  Bone density 12/03/19 - normal.

## 2023-02-14 ENCOUNTER — Ambulatory Visit (INDEPENDENT_AMBULATORY_CARE_PROVIDER_SITE_OTHER): Payer: PPO

## 2023-02-14 ENCOUNTER — Ambulatory Visit (INDEPENDENT_AMBULATORY_CARE_PROVIDER_SITE_OTHER): Payer: PPO | Admitting: Podiatry

## 2023-02-14 ENCOUNTER — Encounter: Payer: Self-pay | Admitting: Podiatry

## 2023-02-14 DIAGNOSIS — S92524A Nondisplaced fracture of medial phalanx of right lesser toe(s), initial encounter for closed fracture: Secondary | ICD-10-CM

## 2023-02-14 DIAGNOSIS — S99921A Unspecified injury of right foot, initial encounter: Secondary | ICD-10-CM | POA: Diagnosis not present

## 2023-02-14 DIAGNOSIS — B351 Tinea unguium: Secondary | ICD-10-CM | POA: Diagnosis not present

## 2023-02-14 DIAGNOSIS — Q828 Other specified congenital malformations of skin: Secondary | ICD-10-CM

## 2023-02-14 DIAGNOSIS — M79674 Pain in right toe(s): Secondary | ICD-10-CM

## 2023-02-14 DIAGNOSIS — E119 Type 2 diabetes mellitus without complications: Secondary | ICD-10-CM | POA: Diagnosis not present

## 2023-02-14 NOTE — Progress Notes (Signed)
Subjective:  [name], female    DOB: 04/18/1951,  MRN: [id number]  Victoria Harris presents to clinic today for: preventative diabetic foot care and painful elongated mycotic toenails 1-5 bilaterally which are tender when wearing enclosed shoe gear. Pain is relieved with periodic professional debridement. Patient states she stubbed her right foot walking to the bathroom in the dark over the weekend. She states right 3rd toe was painful initially, but pain has resolved. She was evaluated by PCP who suspected toe fracture and patient was okay waiting until her appointment with [place] on today. Chief Complaint  Patient presents with   Nail Problem    DFC,A1C:7.2,lov:09/24,Referring Provider Dale [place], MD      Toe Injury    Patient hit her right  3rd toe on door a few days ago, was painful but not now, saw her pcp ,had her to examine, not sure if it was fractured    PCP is Dale , MD.  Allergies  Allergen Reactions   Glucophage Xr [Metformin]     diarrhea   Jardiance [Empagliflozin]     Seen 09/07/21 on this day Stopped jardiance due to pt easily dehydrates at baseline and felt like this was making this worse and causing to feel like she was going to pass out or pass out    Tylenol [Acetaminophen] Hives    Review of Systems: Negative except as noted in the HPI.  Objective: No changes noted in today's physical examination. There were no vitals filed for this visit.  Victoria Harris is a pleasant 72 y.o. female in NAD. AAO x 3.  Vascular Examination: Capillary refill time <3 seconds b/l LE. Palpable pedal pulses b/l LE. Digital hair absent b/l. No pedal edema b/l. Skin temperature gradient WNL b/l. No varicosities b/l. Nonpitting edema noted R 3rd toe.  Dermatological Examination: Pedal skin with normal turgor, texture and tone b/l. No open wounds. No interdigital macerations b/l. Toenails 1-5 b/l thickened, discolored, dystrophic  with subungual debris. There is pain on palpation to dorsal aspect of nailplates.  Ecchymosis noted proximal nailfold right 3rd digit with mild edema.  Neurological Examination: Protective sensation intact with 10 gram monofilament b/l LE. Vibratory sensation intact b/l LE.   Musculoskeletal Examination: Muscle strength 5/5 to all lower extremity muscle groups bilaterally. Plantarflexed metatarsal(s) 2nd metatarsal head right foot and 3rd metatarsal head right foot.  Xray findings right foot: Soft tissue swelling present R 3rd toe. Small incomplete oblique fx noted medial aspect head of middle phalanx. Fracture noted above right 3rd digit in good alignment and nondisplaced.      Latest Ref Rng & Units 02/07/2023    7:56 AM 10/26/2022    8:41 AM 06/26/2022    8:39 AM 03/26/2022    8:18 AM  Hemoglobin A1C  Hemoglobin-A1c 4.6 - 6.5 % 7.2  8.1  7.1  7.2    Assessment/Plan: 1. Pain due to onychomycosis of toenail of right foot   2. Closed nondisplaced fracture of middle phalanx of lesser toe of right foot, initial encounter   3. Porokeratosis   4. Diabetes mellitus without complication (HCC)    -Consent given for treatment as described below: -Examined patient. -Xray of right foot was performed and reviewed with patient and/or POA.Discussed dx of fracture middle phalanx of right 3rd digit. Demonstrated buddy splinting of right 2nd and 3rd digits. Demonstrated checking digital circulation of digits after buddy splinting If toes become numb, it may be too tight. Patient related understanding. Fracture  should heal uneventfully in 4 weeks and it will take some time for bruising to resolve. If she has any increased symptoms, she may schedule an appointment for further evaluation. -Mycotic toenails 1-5 bilaterally were debrided in length and girth with sterile nail nippers and dremel without incident. -Patient/POA to call should there be question/concern in the interim.   Return in about 3 months  (around 05/16/2023).  Freddie Breech, DPM

## 2023-02-16 ENCOUNTER — Encounter: Payer: Self-pay | Admitting: Internal Medicine

## 2023-02-16 NOTE — Assessment & Plan Note (Signed)
Overall appears to be doing well.  Has good support.  Follow.

## 2023-02-16 NOTE — Assessment & Plan Note (Signed)
Colonoscopy 11/2019 - diverticulosis.  Recommended f/u colonoscopy in 5 years.

## 2023-02-16 NOTE — Assessment & Plan Note (Signed)
Continue crestor.  Low cholesterol diet and exercise.  Follow lipid panel and liver function tests.  Lab Results  Component Value Date   CHOL 161 02/07/2023   HDL 64.30 02/07/2023   LDLCALC 75 02/07/2023   LDLDIRECT 93.0 06/26/2022   TRIG 106.0 02/07/2023   CHOLHDL 2 02/07/2023

## 2023-02-16 NOTE — Assessment & Plan Note (Signed)
Continue daily aspirin

## 2023-02-16 NOTE — Assessment & Plan Note (Signed)
Follow cbc.  

## 2023-02-16 NOTE — Assessment & Plan Note (Signed)
Bowels better off metformin.  Did not tolerate jardiance or rybelsus.  Have discussed monitoring carb intake.  A1c improved.  Continue low carb diet and exercise.  On glipizide XL 2.5mg  q day.   Follow met b and a1c.

## 2023-02-21 DIAGNOSIS — H401123 Primary open-angle glaucoma, left eye, severe stage: Secondary | ICD-10-CM | POA: Diagnosis not present

## 2023-02-21 DIAGNOSIS — H401111 Primary open-angle glaucoma, right eye, mild stage: Secondary | ICD-10-CM | POA: Diagnosis not present

## 2023-02-21 DIAGNOSIS — H2513 Age-related nuclear cataract, bilateral: Secondary | ICD-10-CM | POA: Diagnosis not present

## 2023-04-06 ENCOUNTER — Other Ambulatory Visit: Payer: Self-pay | Admitting: Internal Medicine

## 2023-05-16 ENCOUNTER — Encounter: Payer: Self-pay | Admitting: Podiatry

## 2023-05-16 ENCOUNTER — Ambulatory Visit: Payer: PPO | Admitting: Podiatry

## 2023-05-16 DIAGNOSIS — Q828 Other specified congenital malformations of skin: Secondary | ICD-10-CM | POA: Diagnosis not present

## 2023-05-16 DIAGNOSIS — M79674 Pain in right toe(s): Secondary | ICD-10-CM

## 2023-05-16 DIAGNOSIS — E119 Type 2 diabetes mellitus without complications: Secondary | ICD-10-CM | POA: Diagnosis not present

## 2023-05-16 DIAGNOSIS — B351 Tinea unguium: Secondary | ICD-10-CM | POA: Diagnosis not present

## 2023-05-25 NOTE — Progress Notes (Signed)
  Subjective:  [name], female    DOB: 1950/11/29,  MRN: [id number]  72 y.o. female presents preventative diabetic foot care and painful thick toenails that are difficult to trim. Pain interferes with ambulation. Aggravating factors include wearing enclosed shoe gear. Pain is relieved with periodic professional debridement.  She states her right 3rd toe healed fine. She is no longer having pain.  New problem(s): None   PCP is Dale Loomis, MD , and last visit was February 11, 2023.  Allergies  Allergen Reactions   Glucophage Xr [Metformin]     diarrhea   Jardiance [Empagliflozin]     Seen 09/07/21 on this day Stopped jardiance due to pt easily dehydrates at baseline and felt like this was making this worse and causing to feel like she was going to pass out or pass out    Tylenol [Acetaminophen] Hives    Review of Systems: Negative except as noted in the HPI.   Objective:  Victoria Harris is a pleasant 72 y.o. female WD, WN in NAD. AAO x 3.  Vascular Examination: Vascular status intact b/l with palpable pedal pulses. CFT immediate b/l. Pedal hair present. No edema. No pain with calf compression b/l. Skin temperature gradient WNL b/l. No varicosities noted. No cyanosis or clubbing noted.  Neurological Examination: Sensation grossly intact b/l with 10 gram monofilament. Vibratory sensation intact b/l.  Dermatological Examination: Pedal skin with normal turgor, texture and tone b/l. No open wounds nor interdigital macerations noted. Toenails 2-5 and right great toe b/l thick, discolored, elongated with subungual debris and pain on dorsal palpation.   Anonychia noted left great toe. Nailbed(s) epithelialized  Porokeratotic lesion(s) submet head 2 right foot. No erythema, no edema, no drainage, no fluctuance.  Musculoskeletal Examination: Muscle strength 5/5 to b/l LE.  No pain, crepitus noted b/l. Plantarflexed metatarsal(s) 2nd metatarsal head  right lower extremity and 3rd metatarsal head right lower extremity.  Radiographs: None  Last A1c:      Latest Ref Rng & Units 02/07/2023    7:56 AM 10/26/2022    8:41 AM 06/26/2022    8:39 AM  Hemoglobin A1C  Hemoglobin-A1c 4.6 - 6.5 % 7.2  8.1  7.1      Assessment:   1. Pain due to onychomycosis of toenail of right foot   2. Porokeratosis   3. Diabetes mellitus without complication (HCC)    Plan:  -Consent given for treatment as described below: -Examined patient. -Continue supportive shoe gear daily. -Mycotic toenails 1-5 bilaterally were debrided in length and girth with sterile nail nippers and dremel without incident. -Porokeratotic lesion(s) submet head 2 right foot pared and enucleated with sterile currette without incident. Total number of lesions debrided=1. -Patient/POA to call should there be question/concern in the interim.  Return in about 3 months (around 08/14/2023).  Freddie Breech, DPM      [place] LOCATION: [address], [place] [address]                   Office [phone]   [facility] LOCATION: [address] [place], [place] [address] Office [phone]

## 2023-06-04 ENCOUNTER — Telehealth: Payer: Self-pay | Admitting: Internal Medicine

## 2023-06-04 DIAGNOSIS — E7800 Pure hypercholesterolemia, unspecified: Secondary | ICD-10-CM

## 2023-06-04 DIAGNOSIS — E1159 Type 2 diabetes mellitus with other circulatory complications: Secondary | ICD-10-CM

## 2023-06-04 NOTE — Telephone Encounter (Signed)
 Patient need lab orders.

## 2023-06-05 NOTE — Addendum Note (Signed)
 Addended by: Rita Ohara D on: 06/05/2023 08:16 AM   Modules accepted: Orders

## 2023-06-05 NOTE — Telephone Encounter (Signed)
 Labs have been ordered

## 2023-06-10 ENCOUNTER — Telehealth: Payer: Self-pay

## 2023-06-10 NOTE — Telephone Encounter (Signed)
  Spoke to patient in regards to scheduling a 3 month follow up from her last visit in September. Patient states everything is going well and will call back when she feels necessary.

## 2023-06-11 ENCOUNTER — Other Ambulatory Visit (INDEPENDENT_AMBULATORY_CARE_PROVIDER_SITE_OTHER): Payer: PPO

## 2023-06-11 DIAGNOSIS — E7800 Pure hypercholesterolemia, unspecified: Secondary | ICD-10-CM | POA: Diagnosis not present

## 2023-06-11 DIAGNOSIS — E1159 Type 2 diabetes mellitus with other circulatory complications: Secondary | ICD-10-CM | POA: Diagnosis not present

## 2023-06-11 LAB — HEMOGLOBIN A1C: Hgb A1c MFr Bld: 8.1 % — ABNORMAL HIGH (ref 4.6–6.5)

## 2023-06-11 LAB — HEPATIC FUNCTION PANEL
ALT: 24 U/L (ref 0–35)
AST: 24 U/L (ref 0–37)
Albumin: 4.3 g/dL (ref 3.5–5.2)
Alkaline Phosphatase: 63 U/L (ref 39–117)
Bilirubin, Direct: 0.1 mg/dL (ref 0.0–0.3)
Total Bilirubin: 0.8 mg/dL (ref 0.2–1.2)
Total Protein: 6.5 g/dL (ref 6.0–8.3)

## 2023-06-11 LAB — LIPID PANEL
Cholesterol: 168 mg/dL (ref 0–200)
HDL: 56.5 mg/dL
LDL Cholesterol: 88 mg/dL (ref 0–99)
NonHDL: 111.06
Total CHOL/HDL Ratio: 3
Triglycerides: 113 mg/dL (ref 0.0–149.0)
VLDL: 22.6 mg/dL (ref 0.0–40.0)

## 2023-06-11 LAB — CBC WITH DIFFERENTIAL/PLATELET
Basophils Absolute: 0 10*3/uL (ref 0.0–0.1)
Basophils Relative: 0.4 % (ref 0.0–3.0)
Eosinophils Absolute: 0.1 10*3/uL (ref 0.0–0.7)
Eosinophils Relative: 1.5 % (ref 0.0–5.0)
HCT: 42.6 % (ref 36.0–46.0)
Hemoglobin: 14.4 g/dL (ref 12.0–15.0)
Lymphocytes Relative: 28.5 % (ref 12.0–46.0)
Lymphs Abs: 1.5 10*3/uL (ref 0.7–4.0)
MCHC: 33.8 g/dL (ref 30.0–36.0)
MCV: 93 fL (ref 78.0–100.0)
Monocytes Absolute: 0.3 10*3/uL (ref 0.1–1.0)
Monocytes Relative: 5.4 % (ref 3.0–12.0)
Neutro Abs: 3.5 10*3/uL (ref 1.4–7.7)
Neutrophils Relative %: 64.2 % (ref 43.0–77.0)
Platelets: 166 10*3/uL (ref 150.0–400.0)
RBC: 4.58 Mil/uL (ref 3.87–5.11)
RDW: 13.2 % (ref 11.5–15.5)
WBC: 5.4 10*3/uL (ref 4.0–10.5)

## 2023-06-11 LAB — BASIC METABOLIC PANEL
BUN: 24 mg/dL — ABNORMAL HIGH (ref 6–23)
CO2: 26 meq/L (ref 19–32)
Calcium: 9.3 mg/dL (ref 8.4–10.5)
Chloride: 107 meq/L (ref 96–112)
Creatinine, Ser: 0.68 mg/dL (ref 0.40–1.20)
GFR: 86.31 mL/min (ref >60.00)
Glucose, Bld: 173 mg/dL — ABNORMAL HIGH (ref 70–99)
Potassium: 4.1 meq/L (ref 3.5–5.1)
Sodium: 141 meq/L (ref 135–145)

## 2023-06-13 ENCOUNTER — Ambulatory Visit (INDEPENDENT_AMBULATORY_CARE_PROVIDER_SITE_OTHER): Payer: PPO | Admitting: Internal Medicine

## 2023-06-13 ENCOUNTER — Encounter: Payer: Self-pay | Admitting: Internal Medicine

## 2023-06-13 VITALS — BP 130/70 | HR 69 | Temp 98.0°F | Resp 16 | Ht 65.0 in | Wt 178.6 lb

## 2023-06-13 DIAGNOSIS — E7800 Pure hypercholesterolemia, unspecified: Secondary | ICD-10-CM

## 2023-06-13 DIAGNOSIS — F439 Reaction to severe stress, unspecified: Secondary | ICD-10-CM

## 2023-06-13 DIAGNOSIS — Z7984 Long term (current) use of oral hypoglycemic drugs: Secondary | ICD-10-CM | POA: Diagnosis not present

## 2023-06-13 DIAGNOSIS — D696 Thrombocytopenia, unspecified: Secondary | ICD-10-CM

## 2023-06-13 DIAGNOSIS — Z8673 Personal history of transient ischemic attack (TIA), and cerebral infarction without residual deficits: Secondary | ICD-10-CM | POA: Diagnosis not present

## 2023-06-13 DIAGNOSIS — E1159 Type 2 diabetes mellitus with other circulatory complications: Secondary | ICD-10-CM

## 2023-06-13 DIAGNOSIS — N8110 Cystocele, unspecified: Secondary | ICD-10-CM

## 2023-06-13 MED ORDER — GLIPIZIDE ER 2.5 MG PO TB24: 2.5000 mg | ORAL_TABLET | Freq: Every day | ORAL | 1 refills | Status: DC

## 2023-06-13 MED ORDER — ONETOUCH VERIO VI STRP: ORAL_STRIP | 12 refills | Status: AC

## 2023-06-13 NOTE — Progress Notes (Signed)
Subjective:    [name], female    DOB: 1950-06-16, 73 y.o.   MRN: [id number]  Patient here for  Chief Complaint  Patient presents with   Medical Management of Chronic Issues    HPI Here for a scheduled follow up - here to follow up regarding hypercholesterolemia and diabetes. Recent A1c elevated. A1c 8.1. Had intolerance to jardiance, rybelsus and metformin. 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 Previously tried trospium and gemtesa for OAB symptoms. Desires no further intervention. Have discussed evaluation for sleep apnea. Discussed low carb diet and exercise. She has not been watching diet as well. Wants to work on diet and see if sugars improve, prior to adding more medication.    Past Medical History:  Diagnosis Date   Colon polyps    hyperplastic   Diabetes mellitus without complication (HCC)    Diverticulosis    Hx of adenomatous colonic polyps    tubular adenoma   Hypercholesterolemia    Serrated adenoma of colon    Stroke (cerebrum) (HCC)    Thrombocytopenia (HCC)    Uterus descensus    Past Surgical History:  Procedure Laterality Date   ABDOMINAL HYSTERECTOMY  2009   BACK SURGERY  05/2022   CHOLECYSTECTOMY  2007   COLONOSCOPY N/A 04/05/2015   Procedure: COLONOSCOPY;  Surgeon: Christena Deem, MD;  Location: [facility] ENDOSCOPY;  Service: Endoscopy;  Laterality: N/A;   COLONOSCOPY WITH PROPOFOL N/A 10/09/2016   Procedure: COLONOSCOPY WITH PROPOFOL;  Surgeon: Christena Deem, MD;  Location: [facility] ENDOSCOPY;  Service: Endoscopy;  Laterality: N/A;   COLONOSCOPY WITH PROPOFOL N/A 12/02/2019   Procedure: COLONOSCOPY WITH PROPOFOL;  Surgeon: Toledo, Boykin Nearing, MD;  Location: [facility] ENDOSCOPY;  Service: Gastroenterology;  Laterality: N/A;   DILATION AND CURETTAGE OF UTERUS  2001 and 2006   FOOT SURGERY  2007   TONSILLECTOMY  1960   TUBAL LIGATION     vocal cord cyst removed     Family History  Problem Relation  Age of Onset   Colon cancer Mother    Diabetes Mother    Kidney disease Mother    Heart disease Mother    Prostate cancer Father    Diabetes Father    Glaucoma Father    Uterine cancer Maternal Aunt    Breast cancer Paternal Aunt        x2   Stroke Paternal Grandmother    Stroke Paternal Grandfather    Diabetes Brother    Diabetes Brother    Diabetes Other    Social History   Socioeconomic History   Marital status: Married    Spouse name: Not on file   Number of children: Not on file   Years of education: Not on file   Highest education level: Not on file  Occupational History   Not on file  Tobacco Use   Smoking status: Never   Smokeless tobacco: Never  Vaping Use   Vaping status: Never Used  Substance and Sexual Activity   Alcohol use: No    Alcohol/week: 0.0 standard drinks of alcohol   Drug use: No   Sexual activity: Not Currently  Other Topics Concern   Not on file  Social History Narrative   Married   Social Drivers of Health   Financial Resource Strain: Low Risk  (06/14/2023)   Overall Financial Resource Strain (CARDIA)    Difficulty of Paying Living Expenses: Not hard at all  Food Insecurity: No Food Insecurity (  06/14/2023)   Hunger Vital Sign    Worried About Running Out of Food in the Last Year: Never true    Ran Out of Food in the Last Year: Never true  Transportation Needs: No Transportation Needs (06/14/2023)   PRAPARE - Administrator, Civil Service (Medical): No    Lack of Transportation (Non-Medical): No  Physical Activity: Inactive (06/14/2023)   Exercise Vital Sign    Days of Exercise per Week: 0 days    Minutes of Exercise per Session: 0 min  Stress: No Stress Concern Present (06/14/2023)   [employer] of Occupational Health - Occupational Stress Questionnaire    Feeling of Stress : Not at all  Social Connections: Socially Integrated (06/14/2023)   Social Connection and Isolation Panel [NHANES]    Frequency of Communication  with Friends and Family: More than three times a week    Frequency of Social Gatherings with Friends and Family: More than three times a week    Attends Religious Services: More than 4 times per year    Active Member of [employer] or Organizations: Yes    Attends Engineer, structural: More than 4 times per year    Marital Status: Married     Review of Systems  Constitutional:  Negative for appetite change and unexpected weight change.  HENT:  Negative for congestion and sinus pressure.   Respiratory:  Negative for cough, chest tightness and shortness of breath.   Cardiovascular:  Negative for chest pain and palpitations.  Gastrointestinal:  Negative for abdominal pain, diarrhea, nausea and vomiting.  Genitourinary:  Negative for difficulty urinating and dysuria.  Musculoskeletal:  Negative for joint swelling and myalgias.  Skin:  Negative for color change and rash.  Neurological:  Negative for dizziness and headaches.  Psychiatric/Behavioral:  Negative for agitation and dysphoric mood.        Objective:     BP 130/70   Pulse 69   Temp 98 F (36.7 C)   Resp 16   Ht 5\' 5"  (1.651 m)   Wt 178 lb 9.6 oz (81 kg)   SpO2 97%   BMI 29.72 kg/m  Wt Readings from Last 3 Encounters:  06/14/23 178 lb (80.7 kg)  06/13/23 178 lb 9.6 oz (81 kg)  02/11/23 178 lb (80.7 kg)    Physical Exam Vitals reviewed.  Constitutional:      General: She is not in acute distress.    Appearance: Normal appearance.  HENT:     Head: Normocephalic and atraumatic.     Right Ear: External ear normal.     Left Ear: External ear normal.     Mouth/Throat:     Pharynx: No oropharyngeal exudate or posterior oropharyngeal erythema.  Eyes:     General: No scleral icterus.       Right eye: No discharge.        Left eye: No discharge.     Conjunctiva/sclera: Conjunctivae normal.  Neck:     Thyroid: No thyromegaly.  Cardiovascular:     Rate and Rhythm: Normal rate and regular rhythm.  Pulmonary:      Effort: No respiratory distress.     Breath sounds: Normal breath sounds. No wheezing.  Abdominal:     General: Bowel sounds are normal.     Palpations: Abdomen is soft.     Tenderness: There is no abdominal tenderness.  Musculoskeletal:        General: No swelling or tenderness.     Cervical back:  Neck supple. No tenderness.  Lymphadenopathy:     Cervical: No cervical adenopathy.  Skin:    Findings: No erythema or rash.  Neurological:     Mental Status: She is alert.  Psychiatric:        Mood and Affect: Mood normal.        Behavior: Behavior normal.         Outpatient Encounter Medications as of 06/13/2023  Medication Sig   aspirin 81 MG tablet Take 81 mg by mouth daily.   Cholecalciferol 25 MCG (1000 UT) tablet Take 1,000 Units by mouth daily.    dorzolamide-timolol (COSOPT) 22.3-6.8 MG/ML ophthalmic solution Place 1 drop into both eyes 2 (two) times daily.   glipiZIDE (GLUCOTROL XL) 2.5 MG 24 hr tablet Take 1 tablet (2.5 mg total) by mouth daily with breakfast.   glucose blood (ONETOUCH VERIO) test strip USE AS INSTRUCTED TO CHECK BLOOD SUGARS DAILY. DX E11.9   latanoprost (XALATAN) 0.005 % ophthalmic solution 1 drop at bedtime.   Multiple Vitamins-Minerals (MULTIVITAMIN ADULT PO) Take by mouth daily.   Probiotic Product (PROBIOTIC DAILY) CAPS Take 1 capsule by mouth daily.   rosuvastatin (CRESTOR) 5 MG tablet Take 1 tablet (5 mg total) by mouth daily.   [DISCONTINUED] estradiol (ESTRACE) 0.1 MG/GM vaginal cream Place 0.5 g vaginally 2 (two) times a week. Place 0.5g nightly for two weeks then twice a week after   [DISCONTINUED] glipiZIDE (GLUCOTROL XL) 2.5 MG 24 hr tablet TAKE 1 TABLET BY MOUTH DAILY WITH BREAKFAST.   [DISCONTINUED] glucose blood (ONETOUCH VERIO) test strip USE AS INSTRUCTED TO CHECK BLOOD SUGARS DAILY. DX E11.9   [DISCONTINUED] trospium (SANCTURA) 20 MG tablet Take 1 tablet (20 mg total) by mouth 2 (two) times daily.   No facility-administered encounter  medications on file as of 06/13/2023.     Lab Results  Component Value Date   WBC 5.4 06/11/2023   HGB 14.4 06/11/2023   HCT 42.6 06/11/2023   PLT 166.0 06/11/2023   GLUCOSE 173 (H) 06/11/2023   CHOL 168 06/11/2023   TRIG 113.0 06/11/2023   HDL 56.50 06/11/2023   LDLDIRECT 93.0 06/26/2022   LDLCALC 88 06/11/2023   ALT 24 06/11/2023   AST 24 06/11/2023   NA 141 06/11/2023   K 4.1 06/11/2023   CL 107 06/11/2023   CREATININE 0.68 06/11/2023   BUN 24 (H) 06/11/2023   CO2 26 06/11/2023   TSH 1.71 02/07/2023   HGBA1C 8.1 (H) 06/11/2023   MICROALBUR 1.9 06/26/2022    MM 3D SCREENING MAMMOGRAM BILATERAL BREAST Result Date: 12/25/2022 CLINICAL DATA:  Screening. EXAM: DIGITAL SCREENING BILATERAL MAMMOGRAM WITH TOMOSYNTHESIS AND CAD TECHNIQUE: Bilateral screening digital craniocaudal and mediolateral oblique mammograms were obtained. Bilateral screening digital breast tomosynthesis was performed. The images were evaluated with computer-aided detection. COMPARISON:  Previous exam(s). ACR Breast Density Category b: There are scattered areas of fibroglandular density. FINDINGS: There are no findings suspicious for malignancy. IMPRESSION: No mammographic evidence of malignancy. A result letter of this screening mammogram will be mailed directly to the patient. RECOMMENDATION: Screening mammogram in one year. (Code:SM-B-01Y) BI-RADS CATEGORY  1: Negative. Electronically Signed   By: Sherian Rein M.D.   On: 12/25/2022 11:40       Assessment & Plan:  Hypercholesteremia Assessment & Plan: Continue crestor.  Low cholesterol diet and exercise.  Follow lipid panel and liver function tests.  Lab Results  Component Value Date   CHOL 168 06/11/2023   HDL 56.50 06/11/2023   LDLCALC 88  06/11/2023   LDLDIRECT 93.0 06/26/2022   TRIG 113.0 06/11/2023   CHOLHDL 3 06/11/2023     Orders: -     Lipid panel; Future -     Hepatic function panel; Future -     Basic metabolic panel; Future  Type 2  diabetes mellitus with vascular disease (HCC) Assessment & Plan: Bowels better off metformin.  Did not tolerate jardiance or rybelsus.  Have discussed monitoring carb intake.  A1c increased this check.   Continue low carb diet and exercise.  On glipizide XL 2.5mg  q day.   Follow met b and a1c.  Wants to work on diet prior to adding any further medication. Follow.   Orders: -     Hemoglobin A1c; Future -     OneTouch Verio; USE AS INSTRUCTED TO CHECK BLOOD SUGARS DAILY. DX E11.9  Dispense: 100 strip; Refill: 12  Thrombocytopenia (HCC) Assessment & Plan: Follow cbc.    Stress Assessment & Plan: Overall appears to be doing well. Follow.    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 Previously tried trospium and gemtesa for OAB symptoms. Desires no further intervention.    History of CVA (cerebrovascular accident) Assessment & Plan: Continue daily aspirin.     Other orders -     glipiZIDE ER; Take 1 tablet (2.5 mg total) by mouth daily with breakfast.  Dispense: 90 tablet; Refill: 1     Dale Stonewall, MD

## 2023-06-14 ENCOUNTER — Ambulatory Visit (INDEPENDENT_AMBULATORY_CARE_PROVIDER_SITE_OTHER): Payer: PPO | Admitting: *Deleted

## 2023-06-14 VITALS — Ht 65.0 in | Wt 178.0 lb

## 2023-06-14 DIAGNOSIS — Z0000 Encounter for general adult medical examination without abnormal findings: Secondary | ICD-10-CM

## 2023-06-14 NOTE — Progress Notes (Signed)
Subjective:   Victoria Harris is a 73 y.o. female who presents for Medicare Annual (Subsequent) preventive examination.  Visit Complete: Virtual I connected with  Debbora Dus on 06/14/23 by a audio enabled telemedicine application and verified that I am speaking with the correct person using two identifiers. This patient declined Interactive audio and Acupuncturist. Therefore the visit was completed with audio only.   Patient Location: Home  Provider Location: Home Office  I discussed the limitations of evaluation and management by telemedicine. The patient expressed understanding and agreed to proceed.  Vital Signs: Because this visit was a virtual/telehealth visit, some criteria may be missing or patient reported. Any vitals not documented were not able to be obtained and vitals that have been documented are patient reported.  Cardiac Risk Factors include: advanced age (>80men, >68 women);diabetes mellitus;dyslipidemia     Objective:    Today's Vitals   06/14/23 0826  Weight: 178 lb (80.7 kg)  Height: 5\' 5"  (1.651 m)   Body mass index is 29.62 kg/m.     06/14/2023    8:43 AM 05/31/2022    6:00 PM 05/31/2022    7:58 AM 05/02/2022   10:08 AM 09/10/2021    1:18 PM 04/26/2021   10:08 AM 04/25/2020    9:48 AM  Advanced Directives  Does Patient Have a Medical Advance Directive? Yes Yes Yes Yes No Yes Yes  Type of Estate agent of [place];Living will Living will;Healthcare Power of [employer] Power of [place];Living will  Healthcare Power of [place];Living will Healthcare Power of [place];Living will  Does patient want to make changes to medical advance directive?  No - Patient declined No - Patient declined No - Patient declined  No - Patient declined No - Patient declined  Copy of Healthcare Power of Attorney in Chart? No - copy requested No - copy requested  No - copy requested  No - copy requested No - copy requested     Current Medications (verified) Outpatient Encounter Medications as of 06/14/2023  Medication Sig   aspirin 81 MG tablet Take 81 mg by mouth daily.   Cholecalciferol 25 MCG (1000 UT) tablet Take 1,000 Units by mouth daily.    dorzolamide-timolol (COSOPT) 22.3-6.8 MG/ML ophthalmic solution Place 1 drop into both eyes 2 (two) times daily.   glipiZIDE (GLUCOTROL XL) 2.5 MG 24 hr tablet Take 1 tablet (2.5 mg total) by mouth daily with breakfast.   glucose blood (ONETOUCH VERIO) test strip USE AS INSTRUCTED TO CHECK BLOOD SUGARS DAILY. DX E11.9   latanoprost (XALATAN) 0.005 % ophthalmic solution 1 drop at bedtime.   Multiple Vitamins-Minerals (MULTIVITAMIN ADULT PO) Take by mouth daily.   Probiotic Product (PROBIOTIC DAILY) CAPS Take 1 capsule by mouth daily.   rosuvastatin (CRESTOR) 5 MG tablet Take 1 tablet (5 mg total) by mouth daily.   No facility-administered encounter medications on file as of 06/14/2023.    Allergies (verified) Glucophage xr [metformin], Jardiance [empagliflozin], and Tylenol [acetaminophen]   History: Past Medical History:  Diagnosis Date   Colon polyps    hyperplastic   Diabetes mellitus without complication (HCC)    Diverticulosis    Hx of adenomatous colonic polyps    tubular adenoma   Hypercholesterolemia    Serrated adenoma of colon    Stroke (cerebrum) (HCC)    Thrombocytopenia (HCC)    Uterus descensus    Past Surgical History:  Procedure Laterality Date   ABDOMINAL HYSTERECTOMY  2009   BACK SURGERY  05/2022   CHOLECYSTECTOMY  2007   COLONOSCOPY N/A 04/05/2015   Procedure: COLONOSCOPY;  Surgeon: Christena Deem, MD;  Location: [facility] ENDOSCOPY;  Service: Endoscopy;  Laterality: N/A;   COLONOSCOPY WITH PROPOFOL N/A 10/09/2016   Procedure: COLONOSCOPY WITH PROPOFOL;  Surgeon: Christena Deem, MD;  Location: [facility] ENDOSCOPY;  Service: Endoscopy;  Laterality: N/A;   COLONOSCOPY WITH PROPOFOL N/A 12/02/2019   Procedure: COLONOSCOPY WITH PROPOFOL;   Surgeon: Toledo, Boykin Nearing, MD;  Location: [facility] ENDOSCOPY;  Service: Gastroenterology;  Laterality: N/A;   DILATION AND CURETTAGE OF UTERUS  2001 and 2006   FOOT SURGERY  2007   TONSILLECTOMY  1960   TUBAL LIGATION     vocal cord cyst removed     Family History  Problem Relation Age of Onset   Colon cancer Mother    Diabetes Mother    Kidney disease Mother    Heart disease Mother    Prostate cancer Father    Diabetes Father    Glaucoma Father    Uterine cancer Maternal Aunt    Breast cancer Paternal Aunt        x2   Stroke Paternal Grandmother    Stroke Paternal Grandfather    Diabetes Brother    Diabetes Brother    Diabetes Other    Social History   Socioeconomic History   Marital status: Married    Spouse name: Not on file   Number of children: Not on file   Years of education: Not on file   Highest education level: Not on file  Occupational History   Not on file  Tobacco Use   Smoking status: Never   Smokeless tobacco: Never  Vaping Use   Vaping status: Never Used  Substance and Sexual Activity   Alcohol use: No    Alcohol/week: 0.0 standard drinks of alcohol   Drug use: No   Sexual activity: Not Currently  Other Topics Concern   Not on file  Social History Narrative   Married   Social Drivers of Health   Financial Resource Strain: Low Risk  (06/14/2023)   Overall Financial Resource Strain (CARDIA)    Difficulty of Paying Living Expenses: Not hard at all  Food Insecurity: No Food Insecurity (06/14/2023)   Hunger Vital Sign    Worried About Running Out of Food in the Last Year: Never true    Ran Out of Food in the Last Year: Never true  Transportation Needs: No Transportation Needs (06/14/2023)   PRAPARE - Administrator, Civil Service (Medical): No    Lack of Transportation (Non-Medical): No  Physical Activity: Inactive (06/14/2023)   Exercise Vital Sign    Days of Exercise per Week: 0 days    Minutes of Exercise per Session: 0 min   Stress: No Stress Concern Present (06/14/2023)   [employer] of Occupational Health - Occupational Stress Questionnaire    Feeling of Stress : Not at all  Social Connections: Socially Integrated (06/14/2023)   Social Connection and Isolation Panel [NHANES]    Frequency of Communication with Friends and Family: More than three times a week    Frequency of Social Gatherings with Friends and Family: More than three times a week    Attends Religious Services: More than 4 times per year    Active Member of [employer] or Organizations: Yes    Attends Engineer, structural: More than 4 times per year    Marital Status: Married    Tobacco Counseling Counseling  given: Not Answered   Clinical Intake:  Pre-visit preparation completed: Yes  Pain : No/denies pain     BMI - recorded: 29.62 Nutritional Status: BMI 25 -29 Overweight Nutritional Risks: None Diabetes: Yes CBG done?: Yes (FBS 161) CBG resulted in Enter/ Edit results?: No Did pt. bring in CBG monitor from home?: No  How often do you need to have someone help you when you read instructions, pamphlets, or other written materials from your doctor or pharmacy?: 1 - Never  Interpreter Needed?: No  Information entered by :: R. Kristee Angus LPN   Activities of Daily Living    06/14/2023    8:29 AM  In your present state of health, do you have any difficulty performing the following activities:  Hearing? 0  Vision? 0  Comment glasses  Difficulty concentrating or making decisions? 0  Walking or climbing stairs? 0  Dressing or bathing? 0  Doing errands, shopping? 0  Preparing Food and eating ? N  Using the Toilet? N  In the past six months, have you accidently leaked urine? Y  Do you have problems with loss of bowel control? Y  Managing your Medications? N  Managing your Finances? N  Housekeeping or managing your Housekeeping? N    Patient Care Team: Dale Orchards, MD as PCP - General (Internal Medicine)  Indicate  any recent Medical Services you may have received from other than [facility] providers in the past year (date may be approximate).     Assessment:   This is a routine wellness examination for [place].  Hearing/Vision screen Hearing Screening - Comments:: No issues Vision Screening - Comments:: glasses   Goals Addressed             This Visit's Progress    Patient Stated       Wants to get her sugar more regulated      Depression Screen    06/14/2023    8:37 AM 02/11/2023    2:18 PM 06/28/2022   10:04 AM 06/08/2022   10:35 AM 05/02/2022   10:06 AM 03/28/2022    9:03 AM 09/12/2021    8:31 AM  PHQ 2/9 Scores  PHQ - 2 Score 0 0 0 0 0 0 0  PHQ- 9 Score 0 0         Fall Risk    06/14/2023    8:33 AM 02/11/2023    2:18 PM 06/28/2022   10:04 AM 06/08/2022   10:34 AM 05/02/2022   10:09 AM  Fall Risk   Falls in the past year? 0 0 0 0 0  Number falls in past yr: 0 0 0 0 0  Injury with Fall? 0 0 0 0 0  Risk for fall due to : No Fall Risks No Fall Risks No Fall Risks No Fall Risks   Follow up Falls prevention discussed;Falls evaluation completed Falls evaluation completed Falls evaluation completed Falls evaluation completed Falls evaluation completed;Falls prevention discussed    MEDICARE RISK AT HOME: Medicare Risk at Home Any stairs in or around the home?: Yes If so, are there any without handrails?: Yes Home free of loose throw rugs in walkways, pet beds, electrical cords, etc?: No Adequate lighting in your home to reduce risk of falls?: Yes Life alert?: No Use of a cane, walker or w/c?: No Grab bars in the bathroom?: No Shower chair or bench in shower?: Yes Elevated toilet seat or a handicapped toilet?: Yes  Cognitive Function:    04/16/2018    9:32  AM 04/02/2017   10:44 AM  MMSE - Mini Mental State Exam  Orientation to time 5 5  Orientation to Place 5 5  Registration 3 3  Attention/ Calculation 5 5  Recall 3 3  Language- name 2 objects 2 2  Language- repeat 1 1   Language- follow 3 step command 3 3  Language- read & follow direction 1 1  Write a sentence 1 1  Copy design 1 1  Total score 30 30        06/14/2023    8:44 AM 05/02/2022   10:11 AM 04/26/2021   10:27 AM 04/21/2019    9:47 AM  6CIT Screen  What Year? 0 points 0 points 0 points 0 points  What month? 0 points 0 points 0 points 0 points  What time? 0 points 0 points 0 points 0 points  Count back from 20 0 points 0 points  0 points  Months in reverse 0 points 0 points 0 points 0 points  Repeat phrase 0 points 0 points  0 points  Total Score 0 points 0 points  0 points    Immunizations Immunization History  Administered Date(s) Administered   Fluad Quad(high Dose 65+) 04/21/2019, 04/29/2020, 03/28/2022   Influenza, High Dose Seasonal PF 03/23/2016, 04/04/2017, 04/18/2018, 05/30/2021   Influenza,inj,Quad PF,6+ Mos 05/25/2015   Pneumococcal Conjugate-13 07/19/2015   Pneumococcal Polysaccharide-23 11/29/2016   Zoster Recombinant(Shingrix) 04/14/2021, 08/14/2021   Zoster, Live 09/17/2013    TDAP status: Due, Education has been provided regarding the importance of this vaccine. Advised may receive this vaccine at local pharmacy or Health Dept. Aware to provide a copy of the vaccination record if obtained from local pharmacy or Health Dept. Verbalized acceptance and understanding.  Flu Vaccine status: Due, Education has been provided regarding the importance of this vaccine. Advised may receive this vaccine at local pharmacy or Health Dept. Aware to provide a copy of the vaccination record if obtained from local pharmacy or Health Dept. Verbalized acceptance and understanding.  Pneumococcal vaccine status: Up to date  Covid-19 vaccine status: Information provided on how to obtain vaccines.   Qualifies for Shingles Vaccine? Yes   Zostavax completed Yes   Shingrix Completed?: Yes  Screening Tests Health Maintenance  Topic Date Due   Diabetic kidney evaluation - Urine ACR   06/27/2023   INFLUENZA VACCINE  08/26/2023 (Originally 12/27/2022)   FOOT EXAM  06/29/2023   OPHTHALMOLOGY EXAM  08/21/2023   HEMOGLOBIN A1C  12/09/2023   MAMMOGRAM  12/24/2023   Diabetic kidney evaluation - eGFR measurement  06/10/2024   Medicare Annual Wellness (AWV)  06/13/2024   Colonoscopy  12/01/2029   Pneumonia Vaccine 28+ Years old  Completed   DEXA SCAN  Completed   Hepatitis C Screening  Completed   Zoster Vaccines- Shingrix  Completed   HPV VACCINES  Aged Out   DTaP/Tdap/Td  Discontinued   COVID-19 Vaccine  Discontinued    Health Maintenance  Health Maintenance Due  Topic Date Due   Diabetic kidney evaluation - Urine ACR  06/27/2023    Colorectal cancer screening: Type of screening: Colonoscopy. Completed 11/2019. Repeat every 5 years  Mammogram status: Completed 11/2022. Repeat every year  Bone Density status: Completed 11/2019. Results reflect: Bone density results: NORMAL. Repeat every 2 years.  Lung Cancer Screening: (Low Dose CT Chest recommended if Age 50-80 years, 20 pack-year currently smoking OR have quit w/in 15years.) does not qualify.    Additional Screening:  Hepatitis C Screening: does qualify; Completed  03/2017  Vision Screening: Recommended annual ophthalmology exams for early detection of glaucoma and other disorders of the eye. Is the patient up to date with their annual eye exam?  Yes  Who is the provider or what is the name of the office in which the patient attends annual eye exams? [facility] If pt is not established with a provider, would they like to be referred to a provider to establish care? No .   Dental Screening: Recommended annual dental exams for proper oral hygiene  Diabetic Foot Exam: Diabetic Foot Exam: Completed 06/2022  Community Resource Referral / Chronic Care Management: CRR required this visit?  No   CCM required this visit?  No     Plan:     I have personally reviewed and noted the following in the patient's  chart:   Medical and social history Use of alcohol, tobacco or illicit drugs  Current medications and supplements including opioid prescriptions. Patient is not currently taking opioid prescriptions. Functional ability and status Nutritional status Physical activity Advanced directives List of other physicians Hospitalizations, surgeries, and ER visits in previous 12 months Vitals Screenings to include cognitive, depression, and falls Referrals and appointments  In addition, I have reviewed and discussed with patient certain preventive protocols, quality metrics, and best practice recommendations. A written personalized care plan for preventive services as well as general preventive health recommendations were provided to patient.     Sydell Axon, LPN   9/56/2130   After Visit Summary: (MyChart) Due to this being a telephonic visit, the after visit summary with patients personalized plan was offered to patient via MyChart   Nurse Notes: None

## 2023-06-14 NOTE — Patient Instructions (Signed)
[name] , Thank you for taking time to come for your Medicare Wellness Visit. I appreciate your ongoing commitment to your health goals. Please review the following plan we discussed and let me know if I can assist you in the future.   Referrals/Orders/Follow-Ups/Clinician Recommendations: Remember to update your Tetanus (TDAP) and flu vaccines.  This is a list of the screening recommended for you and due dates:  Health Maintenance  Topic Date Due   Yearly kidney health urinalysis for diabetes  06/27/2023   Flu Shot  08/26/2023*   Complete foot exam   06/29/2023   Eye exam for diabetics  08/21/2023   Hemoglobin A1C  12/09/2023   Mammogram  12/24/2023   Yearly kidney function blood test for diabetes  06/10/2024   Medicare Annual Wellness Visit  06/13/2024   Colon Cancer Screening  12/01/2024   Pneumonia Vaccine  Completed   DEXA scan (bone density measurement)  Completed   Hepatitis C Screening  Completed   Zoster (Shingles) Vaccine  Completed   HPV Vaccine  Aged Out   DTaP/Tdap/Td vaccine  Discontinued   COVID-19 Vaccine  Discontinued  *Topic was postponed. The date shown is not the original due date.    Advanced directives: (Copy Requested) Please bring a copy of your health care power of attorney and living will to the office to be added to your chart at your convenience.  Next Medicare Annual Wellness Visit scheduled for next year: Yes 06/17/24 @ 11:30

## 2023-06-16 ENCOUNTER — Encounter: Payer: Self-pay | Admitting: Internal Medicine

## 2023-06-16 NOTE — Assessment & Plan Note (Signed)
Continue daily aspirin 

## 2023-06-16 NOTE — Assessment & Plan Note (Signed)
Follow cbc.  

## 2023-06-16 NOTE — Assessment & Plan Note (Signed)
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 Previously tried trospium and gemtesa for OAB symptoms. Desires no further intervention.

## 2023-06-16 NOTE — Assessment & Plan Note (Signed)
Bowels better off metformin.  Did not tolerate jardiance or rybelsus.  Have discussed monitoring carb intake.  A1c increased this check.   Continue low carb diet and exercise.  On glipizide XL 2.5mg  q day.   Follow met b and a1c.  Wants to work on diet prior to adding any further medication. Follow.

## 2023-06-16 NOTE — Assessment & Plan Note (Signed)
Continue crestor.  Low cholesterol diet and exercise.  Follow lipid panel and liver function tests.  Lab Results  Component Value Date   CHOL 168 06/11/2023   HDL 56.50 06/11/2023   LDLCALC 88 06/11/2023   LDLDIRECT 93.0 06/26/2022   TRIG 113.0 06/11/2023   CHOLHDL 3 06/11/2023

## 2023-06-16 NOTE — Assessment & Plan Note (Signed)
Overall appears to be doing well.  Follow.  

## 2023-07-03 ENCOUNTER — Other Ambulatory Visit: Payer: Self-pay

## 2023-07-03 ENCOUNTER — Telehealth: Payer: Self-pay | Admitting: *Deleted

## 2023-07-03 MED ORDER — FREESTYLE LIBRE 3 PLUS SENSOR MISC: 1 refills | Status: DC

## 2023-07-03 NOTE — Telephone Encounter (Signed)
 Copied from CRM [id number]. Topic: Clinical - Medical Advice >> Jul 03, 2023  9:28 AM Isabell A wrote: Reason for CRM: Patient is requesting to speak with Trish in regard to helping her set up her phone to test her blood sugar with the patches.

## 2023-07-03 NOTE — Telephone Encounter (Signed)
 Rx sent in for [facility] sensors and NV scheduled for 2/7 for teaching.

## 2023-07-05 ENCOUNTER — Ambulatory Visit: Payer: PPO

## 2023-07-05 NOTE — Progress Notes (Signed)
 Pt came in to get a teaching for the [place] 3 plus. I showed pt how to place the sensor o her arm but was unable to connect to phone because she did not know her Apple ID I spoke to her Grandaughter on the phone and she stated that she will help her get it set up and let us  know when she does . So we can connect it to our office

## 2023-07-09 ENCOUNTER — Telehealth: Payer: Self-pay | Admitting: *Deleted

## 2023-07-09 NOTE — Telephone Encounter (Signed)
Called pt back and was able to walk her through on how to connect her phone to our office.

## 2023-07-09 NOTE — Telephone Encounter (Unsigned)
Copied from CRM [id number]. Topic: Clinical - Medical Advice >> Jul 09, 2023  8:54 AM Efraim Kaufmann C wrote: Reason for CRM: patient has questions for doctor about sensor she got put on. Please advise with patient.

## 2023-07-17 ENCOUNTER — Telehealth: Payer: Self-pay

## 2023-07-17 NOTE — Telephone Encounter (Signed)
Copied from CRM [id number]. Topic: Clinical - Medical Advice >> Jul 17, 2023  9:50 AM Deaijah H wrote: Reason for CRM: Sensor giving "Sensor error" looking on advice on how to correct / please call  [phone]

## 2023-07-18 ENCOUNTER — Ambulatory Visit: Payer: Self-pay | Admitting: Internal Medicine

## 2023-07-18 NOTE — Telephone Encounter (Signed)
 See other note

## 2023-07-18 NOTE — Telephone Encounter (Signed)
Spoke with patient and gave instructions to call [facility] to see if they will issue her a replacement sensor. She knocked hers off in the shower. Appt for tomorrow will be cancelled because she does not need an acute visit for this. She is going to use a finger stick to monitor sugars in the mean time. Pt advised to call back once she gets replacement sensor to help her put it on again. Pt requested to be shown how to do it one more time.

## 2023-07-18 NOTE — Telephone Encounter (Signed)
Info only:  Patient calling and needs assistance with sensor. She stated she only gets two sensors a month and is not sure what to do because she keeps getting an error message. Would like a call back.   [phone]        Copied from CRM [id number]. Topic: Clinical - Medication Question >> Jul 18, 2023 10:42 AM Pascal Lux wrote: Reason for CRM: Patient calling and needs assistance with sensor. She stated she only gets two sensors a month and is not sure what to do because she keeps getting an error message. Would like a call back.   [phone] Reason for Disposition  General information question, no triage required and triager able to answer question  Answer Assessment - Initial Assessment Questions 1. REASON FOR CALL or QUESTION: "What is your reason for calling today?" or "How can I best help you?" or "What question do you have that I can help answer?"      Patient calling and needs assistance with sensor. She stated she only gets two sensors a month and is not sure what to do because she keeps getting an error message. Would like a call back.   [phone]  Protocols used: Information Only Call - No Triage-A-AH

## 2023-07-19 ENCOUNTER — Ambulatory Visit: Payer: PPO | Admitting: Nurse Practitioner

## 2023-07-22 ENCOUNTER — Telehealth: Payer: Self-pay

## 2023-07-22 NOTE — Telephone Encounter (Signed)
 Pt scheduled for NV to be taught again how to place sensor. Phone should already be set up.

## 2023-07-22 NOTE — Telephone Encounter (Signed)
 Copied from CRM [id number]. Topic: General - Other >> Jul 22, 2023  1:59 PM Larwance Sachs wrote: Reason for CRM: Patient called in regarding receiving replacement device and getting it connected at clinic, stated Bethann Berkshire advised upon receiving to call into clinic and advise, please reach out to patient regarding schedule or when patient can come into clinic for assistance

## 2023-07-24 ENCOUNTER — Ambulatory Visit: Payer: PPO

## 2023-07-24 NOTE — Progress Notes (Signed)
 Patient is in office today for a nurse visit for CGM Training. Patient was provided with instruction and demonstration on how to Apply CGM sensor and use receiver  Sensor placed to left arm and app on her phone set up and working properly

## 2023-08-06 DIAGNOSIS — H401123 Primary open-angle glaucoma, left eye, severe stage: Secondary | ICD-10-CM | POA: Diagnosis not present

## 2023-08-15 ENCOUNTER — Ambulatory Visit: Payer: PPO | Admitting: Podiatry

## 2023-08-15 ENCOUNTER — Encounter: Payer: Self-pay | Admitting: Podiatry

## 2023-08-15 VITALS — Ht 65.0 in | Wt 178.0 lb

## 2023-08-15 DIAGNOSIS — M79674 Pain in right toe(s): Secondary | ICD-10-CM

## 2023-08-15 DIAGNOSIS — B351 Tinea unguium: Secondary | ICD-10-CM

## 2023-08-15 DIAGNOSIS — E119 Type 2 diabetes mellitus without complications: Secondary | ICD-10-CM

## 2023-08-16 DIAGNOSIS — H353131 Nonexudative age-related macular degeneration, bilateral, early dry stage: Secondary | ICD-10-CM | POA: Diagnosis not present

## 2023-08-16 DIAGNOSIS — H401123 Primary open-angle glaucoma, left eye, severe stage: Secondary | ICD-10-CM | POA: Diagnosis not present

## 2023-08-16 DIAGNOSIS — H2513 Age-related nuclear cataract, bilateral: Secondary | ICD-10-CM | POA: Diagnosis not present

## 2023-08-16 DIAGNOSIS — H401111 Primary open-angle glaucoma, right eye, mild stage: Secondary | ICD-10-CM | POA: Diagnosis not present

## 2023-08-16 LAB — HM DIABETES EYE EXAM

## 2023-08-21 NOTE — Progress Notes (Signed)
  Subjective:  [name], female    DOB: 11-Feb-1951,  MRN: [id number]  73 y.o. female presents preventative diabetic foot care and painful porokeratotic lesion(s) right foot and painful mycotic toenails that limit ambulation. Painful toenails interfere with ambulation. Aggravating factors include wearing enclosed shoe gear. Pain is relieved with periodic professional debridement. Painful porokeratotic lesions are aggravated when weightbearing with and without shoegear. Pain is relieved with periodic professional debridement.  Chief Complaint  Patient presents with   Nail Problem    Pt is here for [facility] last A1C was 8.1 PCP is Dr Lorin Picket and LOV was in January.    New problem(s): None   PCP is Dale Lamb, MD.  Allergies  Allergen Reactions   Glucophage Xr [Metformin]     diarrhea   Jardiance [Empagliflozin]     Seen 09/07/21 on this day Stopped jardiance due to pt easily dehydrates at baseline and felt like this was making this worse and causing to feel like she was going to pass out or pass out    Tylenol [Acetaminophen] Hives    Review of Systems: Negative except as noted in the HPI.   Objective:  Maripat Borba is a pleasant 73 y.o. female in NAD. AAO x 3.  Vascular Examination: Vascular status intact b/l with palpable pedal pulses. CFT immediate b/l. Pedal hair present. No edema. No pain with calf compression b/l. Skin temperature gradient WNL b/l. No varicosities noted. No cyanosis or clubbing noted.  Neurological Examination: Sensation grossly intact b/l with 10 gram monofilament. Vibratory sensation intact b/l.  Dermatological Examination: Pedal skin with normal turgor, texture and tone b/l. No open wounds nor interdigital macerations noted. Toenails 1-5 b/l thick, discolored, elongated with subungual debris and pain on dorsal palpation. Minimal hyperkeratos(is/es) noted submet head 2 right foot.  Musculoskeletal Examination: Muscle  strength 5/5 to b/l LE.  No pain, crepitus noted b/l. No gross pedal deformities. Patient ambulates independently without assistive aids.   Radiographs: None  Last A1c:      Latest Ref Rng & Units 06/11/2023    8:41 AM 02/07/2023    7:56 AM 10/26/2022    8:41 AM  Hemoglobin A1C  Hemoglobin-A1c 4.6 - 6.5 % 8.1  7.2  8.1      Assessment:   1. Pain due to onychomycosis of toenail of right foot   2. Diabetes mellitus without complication (HCC)    Plan:  Patient was evaluated and treated. All patient's and/or POA's questions/concerns addressed on today's visit. Toenails 1-5 debrided in length and girth without incident. Continue foot and shoe inspections daily. Monitor blood glucose per PCP/Endocrinologist's recommendations. Continue soft, supportive shoe gear daily. Report any pedal injuries to medical professional. Call office if there are any questions/concerns. -Patient/POA to call should there be question/concern in the interim.  Return in about 3 months (around 11/15/2023).  Freddie Breech, DPM      [place] LOCATION: [address] [place] [address]                   Office [phone]   [facility] LOCATION: [address] [place], [place] [address] Office [phone]

## 2023-08-31 ENCOUNTER — Other Ambulatory Visit: Payer: Self-pay | Admitting: Internal Medicine

## 2023-09-24 DIAGNOSIS — Z872 Personal history of diseases of the skin and subcutaneous tissue: Secondary | ICD-10-CM | POA: Diagnosis not present

## 2023-09-24 DIAGNOSIS — L821 Other seborrheic keratosis: Secondary | ICD-10-CM | POA: Diagnosis not present

## 2023-09-24 DIAGNOSIS — L578 Other skin changes due to chronic exposure to nonionizing radiation: Secondary | ICD-10-CM | POA: Diagnosis not present

## 2023-09-24 DIAGNOSIS — L564 Polymorphous light eruption: Secondary | ICD-10-CM | POA: Diagnosis not present

## 2023-09-24 DIAGNOSIS — Z86018 Personal history of other benign neoplasm: Secondary | ICD-10-CM | POA: Diagnosis not present

## 2023-09-24 DIAGNOSIS — Z85828 Personal history of other malignant neoplasm of skin: Secondary | ICD-10-CM | POA: Diagnosis not present

## 2023-09-26 ENCOUNTER — Encounter: Payer: Self-pay | Admitting: Family Medicine

## 2023-09-26 ENCOUNTER — Ambulatory Visit
Admission: RE | Admit: 2023-09-26 | Discharge: 2023-09-26 | Disposition: A | Discharge status: Home / Self Care | Source: Ambulatory Visit | Attending: Family Medicine | Admitting: Family Medicine

## 2023-09-26 ENCOUNTER — Ambulatory Visit: Admitting: Family Medicine

## 2023-09-26 ENCOUNTER — Encounter: Payer: Self-pay | Admitting: Internal Medicine

## 2023-09-26 ENCOUNTER — Other Ambulatory Visit: Payer: Self-pay | Admitting: Family Medicine

## 2023-09-26 VITALS — BP 120/74 | Temp 98.3°F | Resp 20 | Ht 65.0 in | Wt 177.5 lb

## 2023-09-26 DIAGNOSIS — R519 Headache, unspecified: Secondary | ICD-10-CM | POA: Diagnosis not present

## 2023-09-26 DIAGNOSIS — E1159 Type 2 diabetes mellitus with other circulatory complications: Secondary | ICD-10-CM

## 2023-09-26 DIAGNOSIS — J323 Chronic sphenoidal sinusitis: Secondary | ICD-10-CM

## 2023-09-26 DIAGNOSIS — Z7985 Long-term (current) use of injectable non-insulin antidiabetic drugs: Secondary | ICD-10-CM | POA: Diagnosis not present

## 2023-09-26 LAB — CBC WITH DIFFERENTIAL/PLATELET
Basophils Absolute: 0 10*3/uL (ref 0.0–0.1)
Basophils Relative: 0.3 % (ref 0.0–3.0)
Eosinophils Absolute: 0.1 10*3/uL (ref 0.0–0.7)
Eosinophils Relative: 1.5 % (ref 0.0–5.0)
HCT: 43.9 % (ref 36.0–46.0)
Hemoglobin: 14.8 g/dL (ref 12.0–15.0)
Lymphocytes Relative: 30.1 % (ref 12.0–46.0)
Lymphs Abs: 1.9 10*3/uL (ref 0.7–4.0)
MCHC: 33.8 g/dL (ref 30.0–36.0)
MCV: 93.9 fl (ref 78.0–100.0)
Monocytes Absolute: 0.4 10*3/uL (ref 0.1–1.0)
Monocytes Relative: 5.8 % (ref 3.0–12.0)
Neutro Abs: 4 10*3/uL (ref 1.4–7.7)
Neutrophils Relative %: 62.3 % (ref 43.0–77.0)
Platelets: 172 10*3/uL (ref 150.0–400.0)
RBC: 4.67 Mil/uL (ref 3.87–5.11)
RDW: 13.6 % (ref 11.5–15.5)
WBC: 6.3 10*3/uL (ref 4.0–10.5)

## 2023-09-26 LAB — COMPREHENSIVE METABOLIC PANEL WITH GFR
ALT: 29 U/L (ref 0–35)
AST: 29 U/L (ref 0–37)
Albumin: 4.4 g/dL (ref 3.5–5.2)
Alkaline Phosphatase: 60 U/L (ref 39–117)
BUN: 14 mg/dL (ref 6–23)
CO2: 24 meq/L (ref 19–32)
Calcium: 9.3 mg/dL (ref 8.4–10.5)
Chloride: 106 meq/L (ref 96–112)
Creatinine, Ser: 0.6 mg/dL (ref 0.40–1.20)
GFR: 88.77 mL/min (ref >60.00)
Glucose, Bld: 163 mg/dL — ABNORMAL HIGH (ref 70–99)
Potassium: 3.9 meq/L (ref 3.5–5.1)
Sodium: 138 meq/L (ref 135–145)
Total Bilirubin: 0.7 mg/dL (ref 0.2–1.2)
Total Protein: 6.8 g/dL (ref 6.0–8.3)

## 2023-09-26 LAB — C-REACTIVE PROTEIN: CRP: <1 mg/dL (ref 0.5–20.0)

## 2023-09-26 LAB — SEDIMENTATION RATE: Sed Rate: 6 mm/h (ref 0–30)

## 2023-09-26 MED ORDER — SACCHAROMYCES BOULARDII 250 MG PO CAPS: 250.0000 mg | ORAL_CAPSULE | Freq: Every day | ORAL | 0 refills | Status: DC

## 2023-09-26 MED ORDER — AMOXICILLIN-POT CLAVULANATE 875-125 MG PO TABS: 1.0000 | ORAL_TABLET | Freq: Two times a day (BID) | ORAL | 0 refills | Status: AC

## 2023-09-26 NOTE — Progress Notes (Signed)
 SUBJECTIVE:   Chief Complaint  Patient presents with   Headache    Off and on X 4 weeks pt has took allergy and tylenol    HPI Presents for acute visit  Discussed the use of AI scribe software for clinical note transcription with the patient, who gave verbal consent to proceed.  History of Present Illness Victoria Harris "Joyceann No" is a 74 year old female with diabetes who presents with new onset headaches.  She has been experiencing headaches for the past four weeks, which are unusual for her. The headaches occur intermittently, with some days being headache-free. The pain is described as a tightness in her head, often behind her eyes, and sometimes more pronounced on the right side. The headaches do not wake her at night, but she experiences a persistent heaviness that affects her daily activities. She occasionally feels nauseated during severe headaches but has not experienced vomiting. No fever, visual disturbances, neck pain, slurred speech, or drooping.  She has been taking Tylenol , up to six tablets a day, to manage the pain, but it does not always provide relief. On one occasion, she tried over-the-counter Sudafed, which did not alleviate her symptoms and kept her awake.  She has a history of diabetes, with recent blood sugar levels being higher than usual, including a reading of 163. She takes her diabetes medication regularly and monitors her blood sugar levels daily. She also has a history of bladder prolapse, which sometimes requires manual adjustment to urinate, but she denies any pain or blood in her urine. Her last blood work was in January, and she is scheduled for upcoming labs and a follow-up appointment.    PERTINENT PMH / PSH: As above  OBJECTIVE:  BP 120/74   Temp 98.3 F (36.8 C)   Resp 20   Ht 5\' 5"  (1.651 m)   Wt 177 lb 8 oz (80.5 kg)   BMI 29.54 kg/m    Physical Exam Vitals reviewed.  Constitutional:      General: She is not in acute distress.     Appearance: She is not ill-appearing.  HENT:     Head: Normocephalic.     Right Ear: Tympanic membrane, ear canal and external ear normal.     Left Ear: Tympanic membrane, ear canal and external ear normal.     Nose: Nose normal.     Mouth/Throat:     Mouth: Mucous membranes are moist.  Eyes:     Extraocular Movements: Extraocular movements intact.     Right eye: Normal extraocular motion.     Left eye: Normal extraocular motion.     Conjunctiva/sclera: Conjunctivae normal.     Pupils: Pupils are equal, round, and reactive to light.  Neck:     Vascular: No carotid bruit.  Cardiovascular:     Rate and Rhythm: Normal rate and regular rhythm.     Pulses: Normal pulses.     Heart sounds: Normal heart sounds.  Pulmonary:     Effort: Pulmonary effort is normal.     Breath sounds: Normal breath sounds.  Abdominal:     General: Bowel sounds are normal. There is no distension.     Palpations: Abdomen is soft.     Tenderness: There is no abdominal tenderness. There is no right CVA tenderness, left CVA tenderness, guarding or rebound.  Musculoskeletal:        General: Normal range of motion.     Cervical back: Normal range of motion. No rigidity.  Right lower leg: No edema.     Left lower leg: No edema.  Lymphadenopathy:     Cervical: No cervical adenopathy.  Skin:    Capillary Refill: Capillary refill takes less than 2 seconds.  Neurological:     General: No focal deficit present.     Mental Status: She is alert and oriented to person, place, and time. Mental status is at baseline.     Motor: No weakness.  Psychiatric:        Mood and Affect: Mood normal.        Behavior: Behavior normal.        Thought Content: Thought content normal.        Judgment: Judgment normal.           09/26/2023   10:32 AM 06/14/2023    8:37 AM 02/11/2023    2:18 PM 06/28/2022   10:04 AM 06/08/2022   10:35 AM  Depression screen PHQ 2/9  Decreased Interest 0 0 0 0 0  Down, Depressed, Hopeless 0  0 0 0 0  PHQ - 2 Score 0 0 0 0 0  Altered sleeping 0 0 0    Tired, decreased energy 0 0 0    Change in appetite 0 0 0    Feeling bad or failure about yourself  0 0 0    Trouble concentrating 0 0 0    Moving slowly or fidgety/restless 0 0 0    Suicidal thoughts 0 0 0    PHQ-9 Score 0 0 0    Difficult doing work/chores Not difficult at all Not difficult at all Not difficult at all        09/26/2023   10:32 AM 02/11/2023    2:18 PM  GAD 7 : Generalized Anxiety Score  Nervous, Anxious, on Edge 0 0  Control/stop worrying 0 0  Worry too much - different things 0 0  Trouble relaxing 0 0  Restless 0 0  Easily annoyed or irritable 0 0  Afraid - awful might happen 0 0  Total GAD 7 Score 0 0  Anxiety Difficulty Not difficult at all Not difficult at all    ASSESSMENT/PLAN:  Acute nonintractable headache, unspecified headache type Assessment & Plan: New onset headaches x 4 weeks.  Not relieved with Tylenol . Pulsating headache in right eye without tearing.  Some nasal congestion.  No nausea or vomiting. No history of migraines in past.  No focal deficits on exam.  Previous history of CVA - CT head, stat - Labs today.  Orders: -     C-reactive protein -     Sedimentation rate -     CBC with Differential/Platelet -     CT HEAD WO CONTRAST ( )  Type 2 diabetes mellitus with vascular disease (HCC) Assessment & Plan: Uncontrolled DM.  Recent glucose 163.  A1c 8.3.  Goal <7.5 for age   Discussed Ozempic benefits for DM.  Open to continued discussions with PC Follow up with PCP for management.  Orders: -     Comprehensive metabolic panel with GFR      PDMP reviewed  No follow-ups on file.  Valli Gaw, MD

## 2023-09-26 NOTE — Assessment & Plan Note (Addendum)
 New onset headaches x 4 weeks.  Not relieved with Tylenol . Pulsating headache in right eye without tearing.  Some nasal congestion.  No nausea or vomiting. No history of migraines in past.  No focal deficits on exam.  Previous history of CVA - CT head, stat - Labs today.

## 2023-09-26 NOTE — Patient Instructions (Signed)
 It was a pleasure meeting you today. Thank you for allowing me to take part in your health care.  Our goals for today as we discussed include:  We will get some labs today.  If they are abnormal or we need to do something about them, I will call you.  If they are normal, I will send you a message on MyChart (if it is active) or a letter in the mail.  If you don't hear from us  in 2 weeks, please call the office at the number below.   Will get imaging today  If needed will call to let you know if you should be evaluated in the emergency department.  Follow up with PCP as scheduled  This is a list of the screening recommended for you and due dates:  Health Maintenance  Topic Date Due   Yearly kidney health urinalysis for diabetes  06/27/2023   Complete foot exam   06/29/2023   Hemoglobin A1C  12/09/2023   Mammogram  12/24/2023   Flu Shot  12/27/2023   Yearly kidney function blood test for diabetes  06/10/2024   Medicare Annual Wellness Visit  06/13/2024   Eye exam for diabetics  08/15/2024   Colon Cancer Screening  12/01/2024   Pneumonia Vaccine  Completed   DEXA scan (bone density measurement)  Completed   Hepatitis C Screening  Completed   Zoster (Shingles) Vaccine  Completed   HPV Vaccine  Aged Out   Meningitis B Vaccine  Aged Out   DTaP/Tdap/Td vaccine  Discontinued   COVID-19 Vaccine  Discontinued      If you have any questions or concerns, please do not hesitate to call the office at [phone].  I look forward to our next visit and until then take care and stay safe.  Regards,   Valli Gaw, MD   [facility]

## 2023-09-26 NOTE — Telephone Encounter (Signed)
 Pt was evaluated by Dr Sueanne Emerald

## 2023-09-26 NOTE — Assessment & Plan Note (Signed)
 Uncontrolled DM.  Recent glucose 163.  A1c 8.3.  Goal <7.5 for age   Discussed Ozempic benefits for DM.  Open to continued discussions with PC Follow up with PCP for management.

## 2023-10-02 ENCOUNTER — Ambulatory Visit: Payer: Self-pay | Admitting: *Deleted

## 2023-10-02 NOTE — Telephone Encounter (Signed)
 FYI-  I called patient to follow up with her. She denies having persistent headaches, reports that it is more like pressure in her face and head. She has finished her abx and symptoms did not clear. She has not been using any nasal sprays. A friend told her to try a sinus rinse. She bought a neil med sinus rinse. She is not feeling bad other than the sinus pressure. Has an appt with you 5/16 for regular follow up. I recommended to continue saline rinse and try nasacort  daily in the evening (she has used before.) She is going to try this and call If persistent symptoms. If you have any other recommendations let me know.

## 2023-10-02 NOTE — Telephone Encounter (Signed)
 Reviewed. Agree with saline and steroid nasal spray. Agree with - if persistent problems, will need to be evaluated. Discussed with Milana Ali

## 2023-10-02 NOTE — Telephone Encounter (Signed)
 Message from [place] E sent at 10/02/2023  8:43 AM EDT  Copied From CRM [phone]. Reason for Triage: Consistent headaches. Patient stated she had a CT last week to rule out the possibilities of anything severe and they concluded that it was sinus issues. Patient got prescribed an antibiotic and that did not work. Patient rated her headaches get to a level 3 out of 10 for pain. Mobile number on file is the best for a callback.   Reason for Disposition  Headache is a chronic symptom (recurrent or ongoing AND present > 4 weeks)  Answer Assessment - Initial Assessment Questions 1. LOCATION: "Where does it hurt?"      R side of head- mostly 2. ONSET: "When did the headache start?" (Minutes, hours or days)      Patient gets up with headache- "heaviness" pressure on R side 3. PATTERN: "Does the pain come and go, or has it been constant since it started?"     Constant- some days worse than others 4. SEVERITY: "How bad is the pain?" and "What does it keep you from doing?"  (e.g., Scale 1-10; mild, moderate, or severe)   - MILD (1-3): doesn't interfere with normal activities    - MODERATE (4-7): interferes with normal activities or awakens from sleep    - SEVERE (8-10): excruciating pain, unable to do any normal activities        Mild/moderate- using Tylenol , using Mucus and sinus relief tablet- clear mucus 5. RECURRENT SYMPTOM: "Have you ever had headaches before?" If Yes, ask: "When was the last time?" and "What happened that time?"      Was prescribed amoxicillin - didn't really help 6. CAUSE: "What do you think is causing the headache?"     Possible sinus cause 7. MIGRAINE: "Have you been diagnosed with migraine headaches?" If Yes, ask: "Is this headache similar?"      no 8. HEAD INJURY: "Has there been any recent injury to the head?"      no 9. OTHER SYMPTOMS: "Do you have any other symptoms?" (fever, stiff neck, eye pain, sore throat, cold symptoms)     No other symptoms  Protocols used:  Headache-A-AH

## 2023-10-02 NOTE — Telephone Encounter (Signed)
  Chief Complaint: headache- pressure- mostly on R side of head- recent scan Symptoms: daily headache- recent scan showed sinus cause Frequency: 5 weeks Pertinent Negatives: Patient denies (fever, stiff neck, eye pain, sore throat, cold symptoms  Disposition: [] ED /[] Urgent Care (no appt availability in office) / [] Appointment(In office/virtual)/ []  [facility]/ [] Home Care/ [] Refused Recommended Disposition /[] [facility]/ [x]  Follow-up with PCP Additional Notes: Patient state she has upcoming appointment(10/18/23)- but she would like to kow what next steps would be- she is still having pain daily- would be willing to try medication- or referral- what ever PCP decised she needs to do. Patient reports she finished her antibiotic- no change.

## 2023-10-04 ENCOUNTER — Encounter (HOSPITAL_COMMUNITY): Payer: Self-pay

## 2023-10-10 ENCOUNTER — Ambulatory Visit: Payer: Self-pay | Admitting: Internal Medicine

## 2023-10-10 ENCOUNTER — Other Ambulatory Visit: Payer: PPO

## 2023-10-10 DIAGNOSIS — Z7984 Long term (current) use of oral hypoglycemic drugs: Secondary | ICD-10-CM

## 2023-10-10 DIAGNOSIS — E7800 Pure hypercholesterolemia, unspecified: Secondary | ICD-10-CM | POA: Diagnosis not present

## 2023-10-10 DIAGNOSIS — E1159 Type 2 diabetes mellitus with other circulatory complications: Secondary | ICD-10-CM | POA: Diagnosis not present

## 2023-10-10 LAB — HEPATIC FUNCTION PANEL
ALT: 20 U/L (ref 0–35)
AST: 20 U/L (ref 0–37)
Albumin: 4.2 g/dL (ref 3.5–5.2)
Alkaline Phosphatase: 59 U/L (ref 39–117)
Bilirubin, Direct: 0.1 mg/dL (ref 0.0–0.3)
Total Bilirubin: 0.7 mg/dL (ref 0.2–1.2)
Total Protein: 6.5 g/dL (ref 6.0–8.3)

## 2023-10-10 LAB — BASIC METABOLIC PANEL WITH GFR
BUN: 16 mg/dL (ref 6–23)
CO2: 26 meq/L (ref 19–32)
Calcium: 9.2 mg/dL (ref 8.4–10.5)
Chloride: 107 meq/L (ref 96–112)
Creatinine, Ser: 0.62 mg/dL (ref 0.40–1.20)
GFR: 88.05 mL/min (ref >60.00)
Glucose, Bld: 184 mg/dL — ABNORMAL HIGH (ref 70–99)
Potassium: 4.1 meq/L (ref 3.5–5.1)
Sodium: 140 meq/L (ref 135–145)

## 2023-10-10 LAB — LIPID PANEL
Cholesterol: 178 mg/dL (ref 0–200)
HDL: 61.9 mg/dL (ref >39.00)
LDL Cholesterol: 90 mg/dL (ref 0–99)
NonHDL: 116.01
Total CHOL/HDL Ratio: 3
Triglycerides: 132 mg/dL (ref 0.0–149.0)
VLDL: 26.4 mg/dL (ref 0.0–40.0)

## 2023-10-10 LAB — HEMOGLOBIN A1C: Hgb A1c MFr Bld: 7.6 % — ABNORMAL HIGH (ref 4.6–6.5)

## 2023-10-11 ENCOUNTER — Encounter: Payer: Self-pay | Admitting: Internal Medicine

## 2023-10-11 ENCOUNTER — Ambulatory Visit: Admitting: Internal Medicine

## 2023-10-11 VITALS — BP 122/64 | HR 62 | Temp 97.8°F | Ht 65.0 in | Wt 179.0 lb

## 2023-10-11 DIAGNOSIS — Z860100 Personal history of colon polyps, unspecified: Secondary | ICD-10-CM

## 2023-10-11 DIAGNOSIS — E7800 Pure hypercholesterolemia, unspecified: Secondary | ICD-10-CM | POA: Diagnosis not present

## 2023-10-11 DIAGNOSIS — Z7984 Long term (current) use of oral hypoglycemic drugs: Secondary | ICD-10-CM | POA: Diagnosis not present

## 2023-10-11 DIAGNOSIS — Z8673 Personal history of transient ischemic attack (TIA), and cerebral infarction without residual deficits: Secondary | ICD-10-CM

## 2023-10-11 DIAGNOSIS — D696 Thrombocytopenia, unspecified: Secondary | ICD-10-CM | POA: Diagnosis not present

## 2023-10-11 DIAGNOSIS — E1159 Type 2 diabetes mellitus with other circulatory complications: Secondary | ICD-10-CM | POA: Diagnosis not present

## 2023-10-11 DIAGNOSIS — R519 Headache, unspecified: Secondary | ICD-10-CM | POA: Diagnosis not present

## 2023-10-11 LAB — SEDIMENTATION RATE: Sed Rate: 7 mm/h (ref 0–30)

## 2023-10-11 LAB — C-REACTIVE PROTEIN: CRP: <1 mg/dL (ref 0.5–20.0)

## 2023-10-11 MED ORDER — FREESTYLE LIBRE 3 PLUS SENSOR MISC: 1 refills | Status: DC

## 2023-10-11 MED ORDER — ROSUVASTATIN CALCIUM 5 MG PO TABS: 5.0000 mg | ORAL_TABLET | Freq: Every day | ORAL | 3 refills | Status: AC

## 2023-10-11 MED ORDER — METHYLPREDNISOLONE 4 MG PO TBPK: ORAL_TABLET | ORAL | 0 refills | Status: DC

## 2023-10-11 MED ORDER — GLIPIZIDE ER 2.5 MG PO TB24: 2.5000 mg | ORAL_TABLET | Freq: Every day | ORAL | 1 refills | Status: DC

## 2023-10-11 NOTE — Progress Notes (Signed)
 Subjective:    [name], female    DOB: Dec 01, 1950, 73 y.o.   MRN: [id number]  Patient here for  Chief Complaint  Patient presents with   Medical Management of Chronic Issues    4 month f/u    HPI Here for a scheduled follow up - follow up regarding hypercholesterolemia, hypertension and diabetes. She had called in 10/02/23. Reported sinus pressure. Had completed abx. Was instructed to use saline nasal rinse and nasacort  nasal spray. To review, she was evaluated 09/26/23 - for headaches. Per note, present for 4 weeks. CT scan - sphenoid sinusitis. Treated with abx as noted above. She reports now persistent headache. Localized over her right side - eye/frontal and lateral head. Has been taking tylenol . Also taking sinus medication. Using a nasal rinse. No significant congestion. Blowing clear mucus. Appears to be worse in the evening. No vision change. No chest pain or sob reported. No rash. No other joint aches. No bites. States blood sugars averaging 160s in the am. Discussed recent labs.    Past Medical History:  Diagnosis Date   Colon polyps    hyperplastic   Diabetes mellitus without complication (HCC)    Diverticulosis    Hx of adenomatous colonic polyps    tubular adenoma   Hypercholesterolemia    Serrated adenoma of colon    Stroke (cerebrum) (HCC)    Thrombocytopenia (HCC)    Uterus descensus    Past Surgical History:  Procedure Laterality Date   ABDOMINAL HYSTERECTOMY  2009   BACK SURGERY  05/2022   CHOLECYSTECTOMY  2007   COLONOSCOPY N/A 04/05/2015   Procedure: COLONOSCOPY;  Surgeon: Deveron Fly, MD;  Location: [facility] ENDOSCOPY;  Service: Endoscopy;  Laterality: N/A;   COLONOSCOPY WITH PROPOFOL  N/A 10/09/2016   Procedure: COLONOSCOPY WITH PROPOFOL ;  Surgeon: Deveron Fly, MD;  Location: [facility] ENDOSCOPY;  Service: Endoscopy;  Laterality: N/A;   COLONOSCOPY WITH PROPOFOL  N/A 12/02/2019   Procedure: COLONOSCOPY WITH PROPOFOL ;  Surgeon:  Toledo, Alphonsus Jeans, MD;  Location: [facility] ENDOSCOPY;  Service: Gastroenterology;  Laterality: N/A;   DILATION AND CURETTAGE OF UTERUS  2001 and 2006   FOOT SURGERY  2007   TONSILLECTOMY  1960   TUBAL LIGATION     vocal cord cyst removed     Family History  Problem Relation Age of Onset   Colon cancer Mother    Diabetes Mother    Kidney disease Mother    Heart disease Mother    Prostate cancer Father    Diabetes Father    Glaucoma Father    Uterine cancer Maternal Aunt    Breast cancer Paternal Aunt        x2   Stroke Paternal Grandmother    Stroke Paternal Grandfather    Diabetes Brother    Diabetes Brother    Diabetes Other    Social History   Socioeconomic History   Marital status: Married    Spouse name: Not on file   Number of children: Not on file   Years of education: Not on file   Highest education level: Not on file  Occupational History   Not on file  Tobacco Use   Smoking status: Never   Smokeless tobacco: Never  Vaping Use   Vaping status: Never Used  Substance and Sexual Activity   Alcohol use: No    Alcohol/week: 0.0 standard drinks of alcohol   Drug use: No   Sexual activity: Not Currently  Other Topics Concern  Not on file  Social History Narrative   Married   Social Drivers of Health   Financial Resource Strain: Low Risk  (06/14/2023)   Overall Financial Resource Strain (CARDIA)    Difficulty of Paying Living Expenses: Not hard at all  Food Insecurity: No Food Insecurity (06/14/2023)   Hunger Vital Sign    Worried About Running Out of Food in the Last Year: Never true    Ran Out of Food in the Last Year: Never true  Transportation Needs: No Transportation Needs (06/14/2023)   PRAPARE - Administrator, Civil Service (Medical): No    Lack of Transportation (Non-Medical): No  Physical Activity: Inactive (06/14/2023)   Exercise Vital Sign    Days of Exercise per Week: 0 days    Minutes of Exercise per Session: 0 min  Stress: No  Stress Concern Present (06/14/2023)   [employer] of Occupational Health - Occupational Stress Questionnaire    Feeling of Stress : Not at all  Social Connections: Socially Integrated (06/14/2023)   Social Connection and Isolation Panel [NHANES]    Frequency of Communication with Friends and Family: More than three times a week    Frequency of Social Gatherings with Friends and Family: More than three times a week    Attends Religious Services: More than 4 times per year    Active Member of [employer] or Organizations: Yes    Attends Engineer, structural: More than 4 times per year    Marital Status: Married     Review of Systems  Constitutional:  Negative for appetite change and unexpected weight change.  HENT:  Negative for postnasal drip.        No increased congestion.   Respiratory:  Negative for cough, chest tightness and shortness of breath.   Cardiovascular:  Negative for chest pain, palpitations and leg swelling.  Gastrointestinal:  Negative for abdominal pain, diarrhea and vomiting.  Genitourinary:  Negative for difficulty urinating and dysuria.  Musculoskeletal:  Negative for joint swelling and myalgias.  Skin:  Negative for color change and rash.  Neurological:  Positive for headaches. Negative for dizziness.  Psychiatric/Behavioral:  Negative for agitation and dysphoric mood.        Objective:     BP 122/64   Pulse 62   Temp 97.8 F (36.6 C)   Ht 5\' 5"  (1.651 m)   Wt 179 lb (81.2 kg)   SpO2 97%   BMI 29.79 kg/m  Wt Readings from Last 3 Encounters:  10/11/23 179 lb (81.2 kg)  09/26/23 177 lb 8 oz (80.5 kg)  08/15/23 178 lb (80.7 kg)    Physical Exam Vitals reviewed.  Constitutional:      General: She is not in acute distress.    Appearance: Normal appearance.  HENT:     Head: Normocephalic and atraumatic.     Right Ear: External ear normal.     Left Ear: External ear normal.     Mouth/Throat:     Pharynx: No oropharyngeal exudate or  posterior oropharyngeal erythema.  Eyes:     General: No scleral icterus.       Right eye: No discharge.        Left eye: No discharge.     Conjunctiva/sclera: Conjunctivae normal.  Neck:     Thyroid : No thyromegaly.  Cardiovascular:     Rate and Rhythm: Normal rate and regular rhythm.  Pulmonary:     Effort: No respiratory distress.     Breath sounds:  Normal breath sounds. No wheezing.  Abdominal:     General: Bowel sounds are normal.     Palpations: Abdomen is soft.     Tenderness: There is no abdominal tenderness.  Musculoskeletal:        General: No swelling or tenderness.     Cervical back: Neck supple. No tenderness.  Lymphadenopathy:     Cervical: No cervical adenopathy.  Skin:    Findings: No erythema or rash.  Neurological:     Mental Status: She is alert.  Psychiatric:        Mood and Affect: Mood normal.        Behavior: Behavior normal.         Outpatient Encounter Medications as of 10/11/2023  Medication Sig   aspirin 81 MG tablet Take 81 mg by mouth daily.   Cholecalciferol 25 MCG (1000 UT) tablet Take 1,000 Units by mouth daily.    dorzolamide -timolol  (COSOPT ) 22.3-6.8 MG/ML ophthalmic solution Place 1 drop into both eyes 2 (two) times daily.   glucose blood (ONETOUCH VERIO) test strip USE AS INSTRUCTED TO CHECK BLOOD SUGARS DAILY. DX E11.9   latanoprost  (XALATAN ) 0.005 % ophthalmic solution 1 drop at bedtime.   methylPREDNISolone  (MEDROL  DOSEPAK) 4 MG TBPK tablet Medrol  dose pak - 6 day taper - take as directed.   Multiple Vitamins-Minerals (MULTIVITAMIN ADULT PO) Take by mouth daily.   Probiotic Product (PROBIOTIC DAILY) CAPS Take 1 capsule by mouth daily.   saccharomyces boulardii (FLORASTOR) 250 MG capsule Take 1 capsule (250 mg total) by mouth daily.   Continuous Glucose Sensor (FREESTYLE LIBRE 3 PLUS SENSOR) MISC Change sensor every 15 days.   glipiZIDE  (GLUCOTROL  XL) 2.5 MG 24 hr tablet Take 1 tablet (2.5 mg total) by mouth daily with breakfast.    rosuvastatin  (CRESTOR ) 5 MG tablet Take 1 tablet (5 mg total) by mouth daily.   [DISCONTINUED] Continuous Glucose Sensor (FREESTYLE LIBRE 3 PLUS SENSOR) MISC CHANGE SENSOR EVERY 15 DAYS.   [DISCONTINUED] glipiZIDE  (GLUCOTROL  XL) 2.5 MG 24 hr tablet Take 1 tablet (2.5 mg total) by mouth daily with breakfast.   [DISCONTINUED] rosuvastatin  (CRESTOR ) 5 MG tablet Take 1 tablet (5 mg total) by mouth daily.   No facility-administered encounter medications on file as of 10/11/2023.     Lab Results  Component Value Date   WBC 6.3 09/26/2023   HGB 14.8 09/26/2023   HCT 43.9 09/26/2023   PLT 172.0 09/26/2023   GLUCOSE 184 (H) 10/10/2023   CHOL 178 10/10/2023   TRIG 132.0 10/10/2023   HDL 61.90 10/10/2023   LDLDIRECT 93.0 06/26/2022   LDLCALC 90 10/10/2023   ALT 20 10/10/2023   AST 20 10/10/2023   NA 140 10/10/2023   K 4.1 10/10/2023   CL 107 10/10/2023   CREATININE 0.62 10/10/2023   BUN 16 10/10/2023   CO2 26 10/10/2023   TSH 1.71 02/07/2023   HGBA1C 7.6 (H) 10/10/2023   MICROALBUR 1.9 06/26/2022    CT HEAD WO CONTRAST ( ) Result Date: 09/26/2023 CLINICAL DATA:  73 year old female with new onset headache, onset 4 weeks ago. EXAM: CT HEAD WITHOUT CONTRAST TECHNIQUE: Contiguous axial images were obtained from the base of the skull through the vertex without intravenous contrast. RADIATION DOSE REDUCTION: This exam was performed according to the departmental dose-optimization program which includes automated exposure control, adjustment of the mA and/or kV according to patient size and/or use of iterative reconstruction technique. COMPARISON:  Brain MRI 10/04/2014.  Head CT 07/25/2014. FINDINGS: Brain: Cerebral volume has not  significantly changed since 2016, within normal limits for age. No midline shift, ventriculomegaly, mass effect, evidence of mass lesion, intracranial hemorrhage or evidence of cortically based acute infarction. Gray-white differentiation is within normal limits for age.  Vascular: No suspicious intracranial vascular hyperdensity. Calcified atherosclerosis at the skull base. Skull: No acute osseous abnormality identified.  Calvarium intact. Sinuses/Orbits: New but chronic appearing sphenoid sinusitis with total opacification of the dominant right sphenoid with mucoperiosteal thickening superimposed. No complicating features are identified. Other Visualized paranasal sinuses and mastoids are clear. Other: Visualized orbits and scalp soft tissues are within normal limits. IMPRESSION: 1. New from 2016 comparisons but chronic appearing Sphenoid Sinusitis. Complete opacification of that sinus with circumferential periosteal thickening. No complicating features are evident. 2. Stable and negative for age noncontrast CT appearance of the brain. Electronically Signed   By: Marlise Simpers M.D.   On: 09/26/2023 13:05       Assessment & Plan:  Nonintractable headache, unspecified chronicity pattern, unspecified headache type Assessment & Plan: Persistent headache. Unclear etiology. Recent CT scan - sphenoid sinusitis. Was treated with abx. Given persistence, will obtain MRI. Recheck ESR and CRP. Medrol  dose pak. Continue saline and steroid nasal spray. D/w neurology regarding f/u - appt made. Also have ENT evaluate - sphenoid sinusitis. Follow.  Call with update.   Orders: -     Sedimentation rate -     C-reactive protein -     MR BRAIN W WO CONTRAST; Future  Hypercholesteremia Assessment & Plan: Continue crestor .  Low cholesterol diet and exercise.  Discussed recent labs. No changes in medication. Follow lipid panel.  Lab Results  Component Value Date   CHOL 178 10/10/2023   HDL 61.90 10/10/2023   LDLCALC 90 10/10/2023   LDLDIRECT 93.0 06/26/2022   TRIG 132.0 10/10/2023   CHOLHDL 3 10/10/2023     Orders: -     Lipid panel; Future -     Hepatic function panel; Future -     Basic metabolic panel with GFR; Future -     TSH; Future  Type 2 diabetes mellitus with vascular  disease (HCC) Assessment & Plan: Bowels better off metformin .  Did not tolerate jardiance  or rybelsus .  Have discussed monitoring carb intake.  She has been watching her diet. Reviewed sugars - CGM - 09/28/23 - 10/11/23 - target range 70%, high 29% with very high 1%. (Time active 96%). A1c improved - 7.6% on recent check. Appear to be trending down. Continues on glipizide . Hold on making changes. Follow sugars.   Orders: -     Hemoglobin A1c; Future -     Microalbumin / creatinine urine ratio; Future  History of colonic polyps Assessment & Plan: Colonoscopy 11/2019 - diverticulosis.  Recommended f/u colonoscopy in 5 years.    History of CVA (cerebrovascular accident) Assessment & Plan: Continue daily aspirin.    Thrombocytopenia (HCC) Assessment & Plan: Follow cbc.    Other orders -     FreeStyle Libre 3 Plus Sensor; Change sensor every 15 days.  Dispense: 2 each; Refill: 1 -     glipiZIDE  ER; Take 1 tablet (2.5 mg total) by mouth daily with breakfast.  Dispense: 90 tablet; Refill: 1 -     Rosuvastatin  Calcium ; Take 1 tablet (5 mg total) by mouth daily.  Dispense: 90 tablet; Refill: 3 -     methylPREDNISolone ; Medrol  dose pak - 6 day taper - take as directed.  Dispense: 21 tablet; Refill: 0     Dellar Fenton, MD

## 2023-10-11 NOTE — Patient Instructions (Signed)
Increase glipizide to 5 mg per day

## 2023-10-14 ENCOUNTER — Encounter: Payer: Self-pay | Admitting: Internal Medicine

## 2023-10-14 ENCOUNTER — Ambulatory Visit: Payer: Self-pay | Admitting: Internal Medicine

## 2023-10-14 ENCOUNTER — Ambulatory Visit: Payer: PPO | Admitting: Internal Medicine

## 2023-10-14 ENCOUNTER — Ambulatory Visit
Admission: RE | Admit: 2023-10-14 | Discharge: 2023-10-14 | Disposition: A | Discharge status: Home / Self Care | Source: Ambulatory Visit | Attending: Internal Medicine | Admitting: Internal Medicine

## 2023-10-14 DIAGNOSIS — R519 Headache, unspecified: Secondary | ICD-10-CM | POA: Insufficient documentation

## 2023-10-14 DIAGNOSIS — J323 Chronic sphenoidal sinusitis: Secondary | ICD-10-CM | POA: Diagnosis not present

## 2023-10-14 MED ORDER — GADOBUTROL 1 MMOL/ML IV SOLN
8.0000 mL | Freq: Once | INTRAVENOUS | Status: AC | PRN
  Administered 2023-10-14: 8 mL via INTRAVENOUS

## 2023-10-14 NOTE — Telephone Encounter (Signed)
 Copied from CRM [id number]. Topic: Clinical - Lab/Test Results >> Oct 14, 2023 11:16 AM Yolande Hench C wrote: Reason for CRM: Patient returned missed call from Ambrose Junk, LPN to discuss lab results message from PCP; please follow up with patient when available [phone]

## 2023-10-15 NOTE — Telephone Encounter (Signed)
 Left message to call the office back regarding the results below. Okay to give the results below.

## 2023-10-15 NOTE — Telephone Encounter (Signed)
-----   Message from [place] sent at 10/15/2023  1:34 PM EDT ----- Please call and noitfy Victoria Harris that her MRI reveals chronic sphenoid sinusitis, otherwise ok. I had given her a medrol  dose pak. Please see how she is feeling.

## 2023-10-16 NOTE — Addendum Note (Signed)
 Addended by: Raejean Bullock on: 10/16/2023 01:45 PM   Modules accepted: Orders

## 2023-10-16 NOTE — Telephone Encounter (Signed)
 Pt response in tn regards to Dose pack.  Labs printed and mailed to pt

## 2023-10-16 NOTE — Telephone Encounter (Signed)
 Order placed for ENT referral. She will keep us  posted.

## 2023-10-16 NOTE — Telephone Encounter (Signed)
 Patient says that headache has improved but is still there. She is agreeable to see ENT and will continue medrol  dosepak and tylenol . Sees Dr Mason Sole 6/6

## 2023-10-16 NOTE — Telephone Encounter (Signed)
 Copied from CRM [id number]. Topic: Clinical - Lab/Test Results >> Oct 16, 2023 11:46 AM Jenice Mitts wrote: Reason for CRM: Patient would like a copy of her last lab results mailed to her address

## 2023-10-16 NOTE — Telephone Encounter (Signed)
 Please call pt and confirm if headache is any better. Any symptom change? See if still ok with ENT referral

## 2023-10-18 ENCOUNTER — Ambulatory Visit: Admitting: Internal Medicine

## 2023-10-21 ENCOUNTER — Encounter: Payer: Self-pay | Admitting: Internal Medicine

## 2023-10-21 NOTE — Assessment & Plan Note (Signed)
 Continue crestor .  Low cholesterol diet and exercise.  Discussed recent labs. No changes in medication. Follow lipid panel.  Lab Results  Component Value Date   CHOL 178 10/10/2023   HDL 61.90 10/10/2023   LDLCALC 90 10/10/2023   LDLDIRECT 93.0 06/26/2022   TRIG 132.0 10/10/2023   CHOLHDL 3 10/10/2023

## 2023-10-21 NOTE — Assessment & Plan Note (Signed)
 Follow cbc.

## 2023-10-21 NOTE — Assessment & Plan Note (Signed)
 Persistent headache. Unclear etiology. Recent CT scan - sphenoid sinusitis. Was treated with abx. Given persistence, will obtain MRI. Recheck ESR and CRP. Medrol  dose pak. Continue saline and steroid nasal spray. D/w neurology regarding f/u - appt made. Also have ENT evaluate - sphenoid sinusitis. Follow.  Call with update.

## 2023-10-21 NOTE — Assessment & Plan Note (Signed)
Continue daily aspirin 

## 2023-10-21 NOTE — Assessment & Plan Note (Signed)
 Bowels better off metformin .  Did not tolerate jardiance  or rybelsus .  Have discussed monitoring carb intake.  She has been watching her diet. Reviewed sugars - CGM - 09/28/23 - 10/11/23 - target range 70%, high 29% with very high 1%. (Time active 96%). A1c improved - 7.6% on recent check. Appear to be trending down. Continues on glipizide . Hold on making changes. Follow sugars.

## 2023-10-21 NOTE — Assessment & Plan Note (Signed)
Colonoscopy 11/2019 - diverticulosis.  Recommended f/u colonoscopy in 5 years.

## 2023-10-22 ENCOUNTER — Telehealth: Payer: Self-pay | Admitting: Internal Medicine

## 2023-10-22 NOTE — Telephone Encounter (Signed)
 Copied from CRM [id number]. Topic: Referral - Question >> Oct 22, 2023  9:48 AM Lajean Pike wrote: Reason for CRM: Patient called in stating that she reached out to [facility] and they claimed to not have received the referral from Dr. Thayne Fine office just yet. I do see the referral request made on the 21st but patient stated that ENT office stated to not have received the referral request. Patient may be contacted at [phone].

## 2023-10-22 NOTE — Telephone Encounter (Signed)
 Patient following up on ENT referral. Per ENT, referral was not received.

## 2023-10-28 DIAGNOSIS — J329 Chronic sinusitis, unspecified: Secondary | ICD-10-CM | POA: Diagnosis not present

## 2023-10-28 DIAGNOSIS — J0130 Acute sphenoidal sinusitis, unspecified: Secondary | ICD-10-CM | POA: Diagnosis not present

## 2023-10-28 DIAGNOSIS — J3489 Other specified disorders of nose and nasal sinuses: Secondary | ICD-10-CM | POA: Diagnosis not present

## 2023-10-30 ENCOUNTER — Telehealth: Payer: Self-pay

## 2023-10-30 NOTE — Telephone Encounter (Signed)
 Patient is aware to keep appointment with Dr Mason Sole.

## 2023-10-30 NOTE — Telephone Encounter (Signed)
 Copied from CRM [id number]. Topic: General - Other >> Oct 30, 2023  8:34 AM Bambi Bonine D wrote: Reason for CRM: Pt wants to inform Dr.Scott that she went to the ENT and seen Dr.Rose. Pt stated that they he prescribed her some nasal spray and prednisone . Pt would like to know if she still needs to keep her appt with Dr.Shah on 6/9 and would like to receive a call back with an update.

## 2023-11-04 DIAGNOSIS — J323 Chronic sphenoidal sinusitis: Secondary | ICD-10-CM | POA: Diagnosis not present

## 2023-11-04 DIAGNOSIS — E569 Vitamin deficiency, unspecified: Secondary | ICD-10-CM | POA: Diagnosis not present

## 2023-11-04 DIAGNOSIS — Z1331 Encounter for screening for depression: Secondary | ICD-10-CM | POA: Diagnosis not present

## 2023-11-04 NOTE — Progress Notes (Signed)
 PCP: Dr. Glendia, Allena RAMAN, MD   Assessment and Plan:   In most patients, we give written parts of the assessment and plan to put the patient under Patient Instructions/After Visit Summary. So some parts are directed to the patient.  Dear [name], It was our pleasure to participate in your care. We have typed up a summary of what we discussed. Assessment & Plan Headaches secondary to chronic sinusitis Chronic sinusitis with right-sided headaches, heaviness, clear nasal discharge, and occasional coughing. Previous treatments with prednisone  and Afrin provided relief, indicating an inflammatory component. MRI and CT scans show no acute abnormalities, supporting a sinusitis diagnosis over a neurological cause. Differential diagnosis includes migraine, occipital neuralgia, and tension-type headache, but these are less likely given the response to sinusitis treatment. ENT has suggested possible surgery - Follow ENT recommendations. - Use steam inhalation therapy as needed. - Change air filters regularly and wear a mask during the process. - Dispose of vacuum dust canisters outside while wearing a mask. - Maintain good air circulation in the home.  Vitamin B12, Vitamin D  today   Type 2 diabetes mellitus  Type 2 diabetes with vascular disease, currently well-managed but experienced temporary hyperglycemia due to steroid treatment for sinusitis. She is aware of lifestyle modifications to counteract temporary increases in blood glucose.  Follow-up Follow-up with ENT for ongoing management of chronic sinusitis. Consider further imaging or surgical intervention if recommended by ENT. - Follow up with ENT as advised. - Consider follow-up with neurology if headaches recur or change in nature. - Draw labs for vitamin B12 and vitamin D  levels.  Follow up as needed   No follow-ups on file.  Interim History date 11/04/2023   Victoria Harris is a 73 y.o. female here for treatment and  evaluation of Headache  Victoria Harris last visit was on 11/03/2015  History of Present Illness Victoria Harris is a 73 year old female with hypercholesterolemia, type 2 diabetes with vascular disease, and a history of CVA who presents with memory issues and headache.  She experiences headaches since April, primarily on the right side above her eye. Over-the-counter medications and initial treatments, including a prednisone  pack and nasal spray, provided no relief. A CT scan showed chronic sphenoid sinusitis, and an MRI of the brain on Oct 14, 2023, showed no acute abnormalities. After a second round of treatment with prednisone  and Afrin nasal spray, headaches have subsided over the past week. She finds steam inhalation helpful. During the headaches, she experienced coughing, sneezing, and watery eyes, which she attributes to allergies. There is no light sensitivity, nausea, or noise sensitivity. Headaches do not resemble occipital neuralgia, tension-type headaches, or migraines   Head CT 09/26/2023 - 1. New from 2016 comparisons but chronic appearing Sphenoid  Sinusitis. Complete opacification of that sinus with circumferential periosteal thickening. No complicating features are evident. 2. Stable and negative for age noncontrast CT appearance of the brain   MRI Brain 10/14/2023 - 1. Essentially normal brain MRI for age. No acute intracranial  abnormality. 2. Chronic sphenoid sinusitis.   Disease Summary: (Aggregate of information from previous visits)    Victoria Harris is a right handed 73 y.o. female  Right MCA infract vs demyelinating plaque - no change with repeat MRI Brain : Patient states that on Sunday July 25 2014 she was at church. She ws not feeling good that moring, she felt really tired. Patient did not eat much well for one day and hardly slept for the two days prior  to incident. Day of church started feeling lethargic, when she sat down at church she felt really weak, when she put  her head back she felt like everything left her body. For a minute or two she had difficulty speaking. A second episode occurred a short time later where a wave a tiredness overcame her, followed by a third episode, all within about 45 minutes. They called 911 and the ambulance brought her to the hospital. She could not walk when ambulance got her. Patient had a loss of memory of the spells at the time. She says arms were very weak. She had another episode at hospital. She felt like her left side face and lips were sollwen. She was given an aspirin once at hospital.She is not sure if Tpa was given  She had a ct scan done MRI Brain done, carotid us  done, Since the stroke she has issues balance. she has to hold onto the wall to walk sometimes. She has signs and symptoms suggestive of essential tremor, referred for Physical Therapy through PCP, but she has not heard anything from them yet. It was set up.   She has no previous history of stroke, not diabetic,no history of hypertension, no high cholesterol, no atrial fibrillation, no sickle cell anemia, no drug abuse she does not snore. She has family history of stroke in her mother.   She does get weak spells occasionally, with the last one around October 2015 while visiting her podiatrist. Felt pins and needles and very hot before the experience. Patience lost conciousness. Had another syncopal episode 4 years ago. Both of these experiences did not include difficulty speaking or other impairments.  Had a negative Holter monitor that lasted 48 hours. Granddaughter and grandson tested positive for Factor V Leiden  Feels a little unstable. Complains of a feeling of soreness in her left leg.  EEG 10/12/14 - Abnormal -slowing  MRI Brain 10/04/14 - old stroke, no tumors  Interim History:  Victoria Harris is a 73 y.o. female here for follow up of Headache . Since her last visit on 10/2014 with us , patient states that she has not had any stroke symptoms since last  visit. Patient continues to take baby aspirin daily.  Patient is getting proper sleep now, eating properly.  Patient family reminds her  To get proper rest, she likes to help people.  Patient says blood pressure has been good.She is remodeling her home.  Patient has been to [place]   with family for vacation. Patient got home she tried to do a lot of things, time change and she got over fatigued. Patient grand daughter starting college soon. She has seizures due to birth defect. Wants Dr Maree to see grand daughter    Victoria Harris mentioned that she is taking her medications as prescribed.  Past Medical History:  Past Medical History:  Diagnosis Date  . Diverticulosis 04/05/2015  . Hypercholesterolemia   . Hyperplastic colon polyp   . Serrated adenoma of colon, unspecified 04/05/2015  . Stroke (CMS-HCC)   . Tubular adenoma of colon, unspecified 10/09/2016  . Uterus descensus     Past Surgical History:  Past Surgical History:  Procedure Laterality Date  . HYSTERECTOMY  05/2007   ovaries left in place  . COLONOSCOPY  07/25/2009   Repeat 5 Years  . EGD  04/05/2015   Serrated adenoma of colon/Diverticulosis  . COLONOSCOPY  04/05/2015   Serrated adenoma of colon/Repeat 89yrs/MUS  . COLONOSCOPY  10/09/2016   Tubular adenoma of colon/Hyperplastic  colon polyp/Repeat 51yrs/MUS  . COLONOSCOPY  12/02/2019   Diverticulosis/Otherwise normal colon/PHx CP/Repeat 72yrs/TKT  . CHOLECYSTECTOMY    . COLONOSCOPY  08/23/2005, 10/19/1999  . DILATION AND CURETTAGE, DIAGNOSTIC / THERAPEUTIC    . Removal of cysts from vocal cord    . Right foot surgery    . TUBAL LIGATION Bilateral    Family History:  Family History  Problem Relation Name Age of Onset  . Diabetes Mother    . Colon cancer Mother    . Cancer Mother    . Myocardial Infarction (Heart attack) Mother    . High blood pressure (Hypertension) Mother    . Stroke Mother    . Prostate cancer Father    . Diabetes Father    . Cancer Father     . High blood pressure (Hypertension) Father    . Anxiety Brother    . Cancer Brother    . Diabetes Brother    . High blood pressure (Hypertension) Brother    . Breast cancer Paternal Aunt    . Breast cancer Paternal Aunt    . Ovarian cancer Other         Relative  . High blood pressure (Hypertension) Son    . Diabetes Maternal Grandmother    . Factor V Leiden deficiency Grandchild     Social History:  Social History   Socioeconomic History  . Marital status: Married  Tobacco Use  . Smoking status: Never  . Smokeless tobacco: Never  Substance and Sexual Activity  . Alcohol use: No  . Drug use: No  . Sexual activity: Not Currently  Social History Narrative   Education: HS   Occupation: not listed   Hobbies: not listed   Marital Status: married   Social Drivers of Corporate investment banker Strain: Low Risk  (06/14/2023)   Received [facility]   Overall Financial Resource Strain (CARDIA)   . Difficulty of Paying Living Expenses: Not hard at all  Food Insecurity: No Food Insecurity (06/14/2023)   Received from [facility]   Hunger Vital Sign   . Worried About Programme researcher, broadcasting/film/video in the Last Year: Never true   . Ran Out of Food in the Last Year: Never true  Transportation Needs: No Transportation Needs (06/14/2023)   Received from [facility] - Transportation   . Lack of Transportation (Medical): No   . Lack of Transportation (Non-Medical): No  Physical Activity: Inactive (06/14/2023)   Received from [facility]   Exercise Vital Sign   . Days of Exercise per Week: 0 days   . Minutes of Exercise per Session: 0 min  Stress: No Stress Concern Present (06/14/2023)   Received from [facility] - Occupational Stress Questionnaire   . Feeling of Stress : Not at all  Social Connections: Socially Integrated (06/14/2023)   Received from [facility]   Social Connection and Isolation Panel   . Frequency of Communication  with Friends and Family: More than three times a week   . Frequency of Social Gatherings with Friends and Family: More than three times a week   . Attends Religious Services: More than 4 times per year   . Active Member of Clubs or Organizations: Yes   . Attends Banker Meetings: More than 4 times per year   . Marital Status: Married  Housing Stability: Unknown (06/14/2023)   Received from [facility] Stability  Vital Sign   . Unable to Pay for Housing in the Last Year: No   . Homeless in the Last Year: No   Allergies:  Allergies  Allergen Reactions  . Acetaminophen  Hives  . Metformin  Diarrhea    diarrhea   Medications: Current Outpatient Medications on File Prior to Visit  Medication Sig Dispense Refill  . aspirin 81 MG EC tablet Take by mouth.    . cholecalciferol (VITAMIN D3) 1,000 unit tablet Take by mouth.    . colestipoL (COLESTID) 1 gram tablet Take 1 tablet (1 g total) by mouth once daily 90 tablet 3  . dorzolamide -timolol  (COSOPT ) 22.3-6.8 mg/mL ophthalmic solution 1 drop 2 (two) times daily    . multivit with minerals/lutein (MULTIVITAMIN 50 PLUS ORAL) Take by mouth    . MYRBETRIQ  25 mg ER Tablet Take 1 tablet (25 mg total) by mouth once daily    . rosuvastatin  (CRESTOR ) 5 MG tablet Take by mouth    . travoprost (TRAVATAN Z) 0.004 % ophthalmic solution 1 drop nightly.     No current facility-administered medications on file prior to visit.     Review of Systems: More than 10 system, ROS form was given to Victoria Harris to fill out and has been seen by Dr. Maree, which will be scanned in her chart.  General Exam:  Vitals There were no vitals taken for this visit. There is no height or weight on file to calculate BMI.  General Exam Patient looks appropriate of age, well built, nourished and appropriately groomed.   Cardiovascular Exam: S1, S2 heart sounds present Carotid exam revealed no bruit Lung exam was clear to auscultation  Neurological  Exam     Alert,     Oriented to time, place, person     Attention span and concentration seemed appropriate     Language seemed intact (naming, spontaneous speech, comprehension)      Pupils were reactive to light, extra-ocular movements are normal      Face is symmetric      Palate and uvular movements are normal      Tongue protrusion was midline      Tone is normal in all extremities, no abnormal movements seen      Muscle strength in all extremities seemed normal.      Deep tendon reflexes were symmetric      Sensations were intact to light touch in all extremities      Gait and station are normal when examined in small exam room  Physical Exam CHEST: Lungs clear to auscultation bilaterally. CARDIOVASCULAR: Heart sounds S1, S2 normal. Regular rate and rhythm. NEUROLOGICAL: Cranial nerves II-XII grossly intact. 5/5 strength in bilateral upper and lower extremities. 2+ brachioradialis, biceps, and knee reflexes bilaterally. Extraocular movements intact, no nystagmus.    Data Reviewed:  EEG 10/12/14 IMPRESSION:  This is a minimally abnormal Video attended EEG study lasting 3 hr 13 min, due to presence of left temporal theta slowing. No epileptiform discharge recorded during this study. No typical spell recorded during this study.   MRI Brain 10/04/14 CLINICAL DATA:  Acute ischemic right MCA stroke. Episode 2 months ago with weakness throughout her body.  EXAM: MRI HEAD WITHOUT AND WITH CONTRAST  TECHNIQUE: Multiplanar, multiecho pulse sequences of the brain and surrounding structures were obtained without and with intravenous contrast.  CONTRAST:  17mL MULTIHANCE  GADOBENATE DIMEGLUMINE  529 MG/ML IV SOLN  COMPARISON:  MRI of the brain 07/26/2014.  FINDINGS: There is persistent diffusion trace abnormality in  the anterior aspect of the right coronal radiata. There is no restricted diffusion, representing some change since the prior exam. No other significant white matter  disease is evident. No acute hemorrhage or mass lesion is present. The ventricles are of normal size.  No significant extraaxial fluid collection is present.  Postcontrast images demonstrate no pathologic enhancement. Specifically, there is no enhancement associated with the right coronal radiata lesion.  Flow is present in the major intracranial arteries. The globes and orbits are intact. The paranasal sinuses and the mastoid air cells are clear.  The skullbase is within normal limits. Midline structures are normal.  IMPRESSION: 1. Evolution of white matter lesion in the anterior right coronal radiata compatible with is subacute/early nonhemorrhagic infarct. 2. No acute intracranial abnormality.  Electronically Signed   By: Lonni Necessary M.D.   On: 10/04/2014 08:27  This note has been created using automated tools and reviewed for accuracy by ELIAS GREGORY RODRIGUEZ.  Attestation Statement:   I personally performed the service, non-incident to. (WP)   ELIAS CORDELLA STALLION, NP

## 2023-11-08 ENCOUNTER — Telehealth: Payer: Self-pay

## 2023-11-08 NOTE — Telephone Encounter (Signed)
 FYI-  Patient called in stating her sensor keeps giving low readings (60s) but when she pricks her finger to check with her meter it is not accurate. No symptoms of low sugars. Yesterday her sensor was alarming that she was 59 but when she check with her meter it was 95. She has been recording sugars daily with her meter: 147,131,154, 135,152, 147.   Advised patient that she should keep her meter with her and if sensor alarms low, check with her meter to confirm. She keeps snacks with her in case she does have a low sugar. She is tolerating medications well at this time (glipizide  5 mg q day- she is taking 2 of her 2.5 mg tablets)

## 2023-11-08 NOTE — Telephone Encounter (Signed)
 See other note

## 2023-11-08 NOTE — Telephone Encounter (Signed)
 LM for patient

## 2023-11-08 NOTE — Telephone Encounter (Unsigned)
 Copied from CRM [id number]. Topic: General - Other >> Nov 08, 2023  8:37 AM Dorisann Garre T wrote: Reason for CRM: patient is requesting a call back regarding her diabetes medication she was on two courses of  Prednisone   she wanted to know if she needed to cut back on her diabetes medication due to her reading low she would like a call back regarding this

## 2023-11-08 NOTE — Telephone Encounter (Signed)
 Copied from CRM [id number]. Topic: General - Other >> Nov 08, 2023  8:37 AM Dorisann Garre T wrote: Reason for CRM: patient is requesting a call back regarding her diabetes medication she was on two courses of  Prednisone   she wanted to know if she needed to cut back on her diabetes medication due to her reading low she would like a call back regarding this >> Nov 08, 2023 10:32 AM Chuck Crater wrote: Patient is returning a call from [place].

## 2023-11-08 NOTE — Telephone Encounter (Signed)
 Agree with checking her sugars with glucometer. If problems with low sugars, let us  know. Will need to decrease medication.

## 2023-11-08 NOTE — Telephone Encounter (Signed)
 Patient aware of below.

## 2023-11-11 NOTE — Telephone Encounter (Signed)
-----   Message from ALLYSON CORDELLA STALLION, NP sent at 11/11/2023  4:28 PM EDT ----- Patient should increase by or start supplementing with 1000 units of Vitamin D3 once a day. Patient should get Vitamin D  checked in 3 months to make sure levels have improved.   The rest of the labs are ok  ----- Message ----- From: Lab, Background User Sent: 11/04/2023   6:33 PM EDT To: ALLYSON CORDELLA STALLION, NP

## 2023-11-21 ENCOUNTER — Other Ambulatory Visit: Payer: Self-pay | Admitting: Internal Medicine

## 2023-11-21 DIAGNOSIS — Z1231 Encounter for screening mammogram for malignant neoplasm of breast: Secondary | ICD-10-CM

## 2023-11-22 ENCOUNTER — Ambulatory Visit: Admitting: Podiatry

## 2023-11-22 ENCOUNTER — Encounter: Payer: Self-pay | Admitting: Podiatry

## 2023-11-22 DIAGNOSIS — D696 Thrombocytopenia, unspecified: Secondary | ICD-10-CM

## 2023-11-22 DIAGNOSIS — B351 Tinea unguium: Secondary | ICD-10-CM

## 2023-11-22 DIAGNOSIS — E119 Type 2 diabetes mellitus without complications: Secondary | ICD-10-CM | POA: Diagnosis not present

## 2023-11-22 DIAGNOSIS — M79674 Pain in right toe(s): Secondary | ICD-10-CM

## 2023-11-24 NOTE — Progress Notes (Signed)
 Subjective:  [name], female    DOB: [birth year],  MRN: [id number]  Victoria Harris presents to clinic today for preventative diabetic foot care and painful porokeratotic lesion(s) right lower extremity and painful mycotic toenails that limit ambulation. Painful toenails interfere with ambulation. Aggravating factors include wearing enclosed shoe gear. Pain is relieved with periodic professional debridement. Painful porokeratotic lesions are aggravated when weightbearing with and without shoegear. Pain is relieved with periodic professional debridement.  Chief Complaint  Patient presents with   [facility]    Rm4 DFC/ diabetic blood sugar 149/ A1c 7 Dr. Glendia last visit May  2025   New problem(s): None.   PCP is Glendia Shad, MD.  Allergies  Allergen Reactions   Glucophage  Xr [Metformin ]     diarrhea   Jardiance  [Empagliflozin ]     Seen 09/07/21 on this day Stopped jardiance  due to pt easily dehydrates at baseline and felt like this was making this worse and causing to feel like she was going to pass out or pass out    Tylenol  [Acetaminophen ] Hives    Review of Systems: Negative except as noted in the HPI.  Objective: No changes noted in today's physical examination. There were no vitals filed for this visit. Victoria Harris is a pleasant 73 y.o. female in NAD. AAO x 3.  Vascular Examination: Capillary refill time immediate b/l. Palpable pedal pulses. Pedal hair present b/l. No pain with calf compression b/l. Skin temperature gradient WNL b/l. No cyanosis or clubbing b/l. No ischemia or gangrene noted b/l. No varicosities noted.  Neurological Examination: Sensation grossly intact b/l with 10 gram monofilament. Vibratory sensation intact b/l.   Dermatological Examination: Pedal skin with normal turgor, texture and tone b/l.  No open wounds. No interdigital macerations.   Toenails 1-5 b/l thick, discolored, elongated with subungual debris and pain  on dorsal palpation.   Minimal hyperkeratos(is/es) noted submet head 2 right foot.  Musculoskeletal Examination: Muscle strength 5/5 to all lower extremity muscle groups bilaterally. No pain, crepitus or joint limitation noted with ROM bilateral LE. No gross bony deformities bilaterally.  Radiographs: None  Last A1c:      Latest Ref Rng & Units 10/10/2023    8:39 AM 06/11/2023    8:41 AM 02/07/2023    7:56 AM  Hemoglobin A1C  Hemoglobin-A1c 4.6 - 6.5 % 7.6  8.1  7.2    Assessment/Plan: 1. Pain due to onychomycosis of toenail of right foot   2. Thrombocytopenia (HCC)   3. Diabetes mellitus without complication [facility])    Consent given for treatment. Patient examined. All patient's and/or POA's questions/concerns addressed on today's visit. Mycotic toenails 1-5 debrided in length and girth without incident. Continue foot and shoe inspections daily. Monitor blood glucose per PCP/Endocrinologist's recommendations.Continue soft, supportive shoe gear daily. Report any pedal injuries to medical professional. Call office if there are any quesitons/concerns. -Patient/POA to call should there be question/concern in the interim.   Return in about 3 months (around 02/22/2024).  Delon LITTIE Merlin, DPM      St.  LOCATION: [address] [place] [address]                   Office [phone])  [phone]   [facility] LOCATION: [address] [place], [place] [address] Office [phone]

## 2023-11-25 DIAGNOSIS — J323 Chronic sphenoidal sinusitis: Secondary | ICD-10-CM | POA: Diagnosis not present

## 2023-11-26 ENCOUNTER — Other Ambulatory Visit: Payer: Self-pay | Admitting: Internal Medicine

## 2023-11-26 ENCOUNTER — Ambulatory Visit: Payer: Self-pay

## 2023-11-26 MED ORDER — GLIPIZIDE ER 5 MG PO TB24: 5.0000 mg | ORAL_TABLET | Freq: Every day | ORAL | 1 refills | Status: DC

## 2023-11-26 NOTE — Telephone Encounter (Signed)
 Noted

## 2023-11-26 NOTE — Progress Notes (Signed)
 Rx for glipizide  sent to [facility]

## 2023-11-26 NOTE — Telephone Encounter (Signed)
 I will send in rx for glipizide  XL 5mg  q day.

## 2023-11-26 NOTE — Telephone Encounter (Signed)
 Spoke to pt she is tolerating taking 2 of the 2.5 mg of Glipizide , pt stated that she will run out by Tuesday and would like you to send in the new rx for the 5 mg Glipizide 

## 2023-11-26 NOTE — Telephone Encounter (Signed)
  FYI Only or Action Required?: Action required by provider: medication refill request. Increase glipizide  from 2.5mg  to 5mg   Patient was last seen in primary care on 10/11/2023 by Glendia Shad, MD. Called Nurse Triage reporting medication question and information only. Symptoms began n/a. Interventions attempted: Other: patient has been taking 5mg  glipizide  at home. Symptoms are: gradually improving.  Triage Disposition:   Patient/caregiver understands and will follow disposition?: Yes  Copied from CRM [id number]. Topic: Clinical - Prescription Issue >> Nov 26, 2023 10:44 AM Drema MATSU wrote: Reason for CRM: Patient stated that doubling up on glipiZIDE  (GLUCOTROL  XL) 2.5 MG 24 hr tablet has been doing pretty good and she would like a prescription written for 5mg  now. [facility] [place]. She has enough to last her until Tuesday. Reason for Disposition  [1] [name] requesting NON-URGENT health information AND [2] PCP's office is the best resource  Answer Assessment - Initial Assessment Questions 1. REASON FOR CALL or QUESTION: What is your reason for calling today? or How can I best help you? or What question do you have that I can help answer?      Patient called to request RX to increase glipizide  from 2.5mg  to 5mg  stating that blood sugars have been good on this dosage.   She has completed all of her steroid medication since 11/04/2023  Reports that headaches had resolved, but  have come back after being out of town and coming home. She had CT scan ordered by ENT on 11/25/2023  Protocols used: Information Only Call - No Triage-A-AH

## 2023-11-27 ENCOUNTER — Other Ambulatory Visit: Payer: Self-pay

## 2023-11-27 DIAGNOSIS — E1159 Type 2 diabetes mellitus with other circulatory complications: Secondary | ICD-10-CM

## 2023-12-02 DIAGNOSIS — J323 Chronic sphenoidal sinusitis: Secondary | ICD-10-CM | POA: Diagnosis not present

## 2023-12-24 ENCOUNTER — Ambulatory Visit: Admitting: Podiatry

## 2023-12-24 DIAGNOSIS — L603 Nail dystrophy: Secondary | ICD-10-CM

## 2023-12-24 NOTE — Patient Instructions (Signed)

## 2023-12-24 NOTE — Progress Notes (Signed)
 Subjective:  [name], female    DOB: Oct 06, 1950,  MRN: [id number]  Chief Complaint  Patient presents with   Nail Problem    Right hallux nail discomfort     73 y.o. female presents with the above complaint.  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  She would like to discuss treatment options for this she would like to have it removed.  She would like to make it permanent pain scale 7 out of 10 dull aching nature hurts with ambulation or shoe pressure   Review of Systems: Negative except as noted in the HPI. Denies N/V/F/Ch.  Past Medical History:  Diagnosis Date   Colon polyps    hyperplastic   Diabetes mellitus without complication (HCC)    Diverticulosis    Hx of adenomatous colonic polyps    tubular adenoma   Hypercholesterolemia    Serrated adenoma of colon    Stroke (cerebrum) (HCC)    Thrombocytopenia (HCC)    Uterus descensus     Current Outpatient Medications:    aspirin 81 MG tablet, Take 81 mg by mouth daily., Disp: , Rfl:    Cholecalciferol 25 MCG (1000 UT) tablet, Take 1,000 Units by mouth daily. , Disp: , Rfl:    Continuous Glucose Sensor (FREESTYLE LIBRE 3 PLUS SENSOR) MISC, Change sensor every 15 days., Disp: 2 each, Rfl: 1   dorzolamide -timolol  (COSOPT ) 22.3-6.8 MG/ML ophthalmic solution, Place 1 drop into both eyes 2 (two) times daily., Disp: , Rfl: 5   glipiZIDE  (GLUCOTROL  XL) 5 MG 24 hr tablet, Take 1 tablet (5 mg total) by mouth daily with breakfast., Disp: 90 tablet, Rfl: 1   glucose blood (ONETOUCH VERIO) test strip, USE AS INSTRUCTED TO CHECK BLOOD SUGARS DAILY. DX E11.9, Disp: 100 strip, Rfl: 12   latanoprost  (XALATAN ) 0.005 % ophthalmic solution, 1 drop at bedtime., Disp: , Rfl:    methylPREDNISolone  (MEDROL  DOSEPAK) 4 MG TBPK tablet, Medrol  dose pak - 6 day taper - take as directed., Disp: 21 tablet, Rfl: 0   Multiple Vitamins-Minerals (MULTIVITAMIN ADULT PO), Take by  mouth daily., Disp: , Rfl:    Probiotic Product (PROBIOTIC DAILY) CAPS, Take 1 capsule by mouth daily., Disp: , Rfl:    rosuvastatin  (CRESTOR ) 5 MG tablet, Take 1 tablet (5 mg total) by mouth daily., Disp: 90 tablet, Rfl: 3   saccharomyces boulardii (FLORASTOR) 250 MG capsule, Take 1 capsule (250 mg total) by mouth daily., Disp: 90 capsule, Rfl: 0  Social History   Tobacco Use  Smoking Status Never  Smokeless Tobacco Never    Allergies  Allergen Reactions   Glucophage  Xr [Metformin ]     diarrhea   Jardiance  [Empagliflozin ]     Seen 09/07/21 on this day Stopped jardiance  due to pt easily dehydrates at baseline and felt like this was making this worse and causing to feel like she was going to pass out or pass out    Tylenol  [Acetaminophen ] Hives   Objective:  There were no vitals filed for this visit. There is no height or weight on file to calculate BMI. Constitutional Well developed. Well nourished.  Vascular Dorsalis pedis pulses palpable bilaterally. Posterior tibial pulses palpable bilaterally. Capillary refill normal to all digits.  No cyanosis or clubbing noted. Pedal hair growth normal.  Neurologic Normal speech. Oriented to person, place, and time. Epicritic sensation to light touch grossly present bilaterally.  Dermatologic Pain on palpation of the entire/total nail on 1st  digit of the right No other open wounds. No skin lesions.  Orthopedic: Normal joint ROM without pain or crepitus bilaterally. No visible deformities. No bony tenderness.   Radiographs: None Assessment:   1. Nail dystrophy    Plan:  Patient was evaluated and treated and all questions answered.  Nail contusion/dystrophy hallux, right -Patient elects to proceed with minor surgery to remove entire toenail today. Consent reviewed and signed by patient. -Entire/total nail excised. See procedure note. -Educated on post-procedure care including soaking. Written instructions provided and  reviewed. -Patient to follow up in 2 weeks for nail check.  Procedure: Excision of entire/total nail with phenol matricectomy Location: Right 1st toe digit Anesthesia: Lidocaine  1% plain; 1.5 mL and Marcaine 0.5% plain; 1.5 mL, digital block. Skin Prep: Betadine. Dressing: Silvadene; telfa; dry, sterile, compression dressing. Technique: Following skin prep, the toe was exsanguinated and a tourniquet was secured at the base of the toe. The affected nail border was freed and excised. The tourniquet was then removed and sterile dressing applied. Disposition: Patient tolerated procedure well. Patient to return in 2 weeks for follow-up.   No follow-ups on file.

## 2023-12-25 ENCOUNTER — Ambulatory Visit
Admission: RE | Admit: 2023-12-25 | Discharge: 2023-12-25 | Disposition: A | Discharge status: Home / Self Care | Source: Ambulatory Visit | Attending: Internal Medicine | Admitting: Internal Medicine

## 2023-12-25 DIAGNOSIS — Z1231 Encounter for screening mammogram for malignant neoplasm of breast: Secondary | ICD-10-CM | POA: Insufficient documentation

## 2023-12-30 ENCOUNTER — Other Ambulatory Visit: Payer: Self-pay | Admitting: Internal Medicine

## 2024-01-07 ENCOUNTER — Other Ambulatory Visit: Payer: Self-pay | Admitting: Podiatry

## 2024-01-07 ENCOUNTER — Telehealth: Payer: Self-pay | Admitting: Podiatry

## 2024-01-07 MED ORDER — DOXYCYCLINE HYCLATE 100 MG PO TABS: 100.0000 mg | ORAL_TABLET | Freq: Two times a day (BID) | ORAL | 0 refills | Status: DC

## 2024-01-07 NOTE — Telephone Encounter (Signed)
 Patient called stating she had a toe nail ingrown removed and now it looks infected. Per Dr Tobie he sent over RX to pharmacy for antibiotics.

## 2024-01-09 ENCOUNTER — Ambulatory Visit (INDEPENDENT_AMBULATORY_CARE_PROVIDER_SITE_OTHER): Admitting: Podiatry

## 2024-01-09 DIAGNOSIS — L97511 Non-pressure chronic ulcer of other part of right foot limited to breakdown of skin: Secondary | ICD-10-CM

## 2024-01-09 NOTE — Progress Notes (Signed)
 Subjective:  [name], female    DOB: 11-16-1950,  MRN: [id number]  Chief Complaint  Patient presents with   Nail Problem    Nail check     73 y.o. female presents with the above complaint.  Patient presents with right hallux superficial granular wound with a history of total nail avulsion with phenol matrixectomy she states that is causing some soreness she states she is doing okay but wanted to get it evaluated denies any other acute complaints.  Would like to discuss treatment options for this.   Review of Systems: Negative except as noted in the HPI. Denies N/V/F/Ch.  Past Medical History:  Diagnosis Date   Colon polyps    hyperplastic   Diabetes mellitus without complication (HCC)    Diverticulosis    Hx of adenomatous colonic polyps    tubular adenoma   Hypercholesterolemia    Serrated adenoma of colon    Stroke (cerebrum) (HCC)    Thrombocytopenia (HCC)    Uterus descensus     Current Outpatient Medications:    doxycycline  (VIBRA -TABS) 100 MG tablet, Take 1 tablet (100 mg total) by mouth 2 (two) times daily., Disp: 20 tablet, Rfl: 0   aspirin 81 MG tablet, Take 81 mg by mouth daily., Disp: , Rfl:    Cholecalciferol 25 MCG (1000 UT) tablet, Take 1,000 Units by mouth daily. , Disp: , Rfl:    Continuous Glucose Sensor (FREESTYLE LIBRE 3 PLUS SENSOR) MISC, CHANGE SENSOR EVERY 15 DAYS., Disp: 2 each, Rfl: 1   dorzolamide -timolol  (COSOPT ) 22.3-6.8 MG/ML ophthalmic solution, Place 1 drop into both eyes 2 (two) times daily., Disp: , Rfl: 5   glipiZIDE  (GLUCOTROL  XL) 5 MG 24 hr tablet, Take 1 tablet (5 mg total) by mouth daily with breakfast., Disp: 90 tablet, Rfl: 1   glucose blood (ONETOUCH VERIO) test strip, USE AS INSTRUCTED TO CHECK BLOOD SUGARS DAILY. DX E11.9, Disp: 100 strip, Rfl: 12   latanoprost  (XALATAN ) 0.005 % ophthalmic solution, 1 drop at bedtime., Disp: , Rfl:    methylPREDNISolone  (MEDROL  DOSEPAK) 4 MG TBPK tablet, Medrol  dose pak - 6  day taper - take as directed., Disp: 21 tablet, Rfl: 0   Multiple Vitamins-Minerals (MULTIVITAMIN ADULT PO), Take by mouth daily., Disp: , Rfl:    Probiotic Product (PROBIOTIC DAILY) CAPS, Take 1 capsule by mouth daily., Disp: , Rfl:    rosuvastatin  (CRESTOR ) 5 MG tablet, Take 1 tablet (5 mg total) by mouth daily., Disp: 90 tablet, Rfl: 3   saccharomyces boulardii (FLORASTOR) 250 MG capsule, Take 1 capsule (250 mg total) by mouth daily., Disp: 90 capsule, Rfl: 0  Social History   Tobacco Use  Smoking Status Never  Smokeless Tobacco Never    Allergies  Allergen Reactions   Glucophage  Xr [Metformin ]     diarrhea   Jardiance  [Empagliflozin ]     Seen 09/07/21 on this day Stopped jardiance  due to pt easily dehydrates at baseline and felt like this was making this worse and causing to feel like she was going to pass out or pass out    Tylenol  [Acetaminophen ] Hives   Objective:  There were no vitals filed for this visit. There is no height or weight on file to calculate BMI. Constitutional Well developed. Well nourished.  Vascular Dorsalis pedis pulses palpable bilaterally. Posterior tibial pulses palpable bilaterally. Capillary refill normal to all digits.  No cyanosis or clubbing noted. Pedal hair growth normal.  Neurologic Normal speech. Oriented to person, place, and time. Epicritic  sensation to light touch grossly present bilaterally.  Dermatologic Right superficial granular wound noted limited to the breakdown of the skin.  Does not probe down to deep tissue no exposure of bone noted.  No purulent drainage noted no malodor present no cellulitis noted  Orthopedic: Normal joint ROM without pain or crepitus bilaterally. No visible deformities. No bony tenderness.   Radiographs: None Assessment:   1. Skin ulcer of right great toe, limited to breakdown of skin [facility])    Plan:  Patient was evaluated and treated and all questions answered.  Right great toe superficial wound  limited to the breakdown of the skin - All questions and concerns were discussed with the patient in extensive detail at this time I encouraged her to do Betadine wet-to-dry dressing changes daily.  She is currently taking antibiotics encouraged her to finish her course - This is likely due to phenol chemical burn causing slow healing. - We will continue to clinically monitor  No follow-ups on file.

## 2024-02-04 ENCOUNTER — Ambulatory Visit (INDEPENDENT_AMBULATORY_CARE_PROVIDER_SITE_OTHER): Admitting: Podiatry

## 2024-02-04 DIAGNOSIS — L97511 Non-pressure chronic ulcer of other part of right foot limited to breakdown of skin: Secondary | ICD-10-CM | POA: Diagnosis not present

## 2024-02-04 NOTE — Progress Notes (Signed)
 Subjective:  [name], female    DOB: 1950-08-26,  MRN: [id number]  Chief Complaint  Patient presents with   Wound Check    73 y.o. female presents with the above complaint.  Patient presents for follow-up right hallux superficial granular wound.  She states that she is doing a lot better denies any other acute complaints.   Review of Systems: Negative except as noted in the HPI. Denies N/V/F/Ch.  Past Medical History:  Diagnosis Date   Colon polyps    hyperplastic   Diabetes mellitus without complication (HCC)    Diverticulosis    Hx of adenomatous colonic polyps    tubular adenoma   Hypercholesterolemia    Serrated adenoma of colon    Stroke (cerebrum) (HCC)    Thrombocytopenia (HCC)    Uterus descensus     Current Outpatient Medications:    doxycycline  (VIBRA -TABS) 100 MG tablet, Take 1 tablet (100 mg total) by mouth 2 (two) times daily., Disp: 20 tablet, Rfl: 0   aspirin 81 MG tablet, Take 81 mg by mouth daily., Disp: , Rfl:    Cholecalciferol 25 MCG (1000 UT) tablet, Take 1,000 Units by mouth daily. , Disp: , Rfl:    Continuous Glucose Sensor (FREESTYLE LIBRE 3 PLUS SENSOR) MISC, CHANGE SENSOR EVERY 15 DAYS., Disp: 2 each, Rfl: 1   dorzolamide -timolol  (COSOPT ) 22.3-6.8 MG/ML ophthalmic solution, Place 1 drop into both eyes 2 (two) times daily., Disp: , Rfl: 5   glipiZIDE  (GLUCOTROL  XL) 5 MG 24 hr tablet, Take 1 tablet (5 mg total) by mouth daily with breakfast., Disp: 90 tablet, Rfl: 1   glucose blood (ONETOUCH VERIO) test strip, USE AS INSTRUCTED TO CHECK BLOOD SUGARS DAILY. DX E11.9, Disp: 100 strip, Rfl: 12   latanoprost  (XALATAN ) 0.005 % ophthalmic solution, 1 drop at bedtime., Disp: , Rfl:    methylPREDNISolone  (MEDROL  DOSEPAK) 4 MG TBPK tablet, Medrol  dose pak - 6 day taper - take as directed., Disp: 21 tablet, Rfl: 0   Multiple Vitamins-Minerals (MULTIVITAMIN ADULT PO), Take by mouth daily., Disp: , Rfl:    Probiotic Product (PROBIOTIC  DAILY) CAPS, Take 1 capsule by mouth daily., Disp: , Rfl:    rosuvastatin  (CRESTOR ) 5 MG tablet, Take 1 tablet (5 mg total) by mouth daily., Disp: 90 tablet, Rfl: 3   saccharomyces boulardii (FLORASTOR) 250 MG capsule, Take 1 capsule (250 mg total) by mouth daily., Disp: 90 capsule, Rfl: 0  Social History   Tobacco Use  Smoking Status Never  Smokeless Tobacco Never    Allergies  Allergen Reactions   Glucophage  Xr [Metformin ]     diarrhea   Jardiance  [Empagliflozin ]     Seen 09/07/21 on this day Stopped jardiance  due to pt easily dehydrates at baseline and felt like this was making this worse and causing to feel like she was going to pass out or pass out    Tylenol  [Acetaminophen ] Hives   Objective:  There were no vitals filed for this visit. There is no height or weight on file to calculate BMI. Constitutional Well developed. Well nourished.  Vascular Dorsalis pedis pulses palpable bilaterally. Posterior tibial pulses palpable bilaterally. Capillary refill normal to all digits.  No cyanosis or clubbing noted. Pedal hair growth normal.  Neurologic Normal speech. Oriented to person, place, and time. Epicritic sensation to light touch grossly present bilaterally.  Dermatologic No further wound noted.  No signs of deeper infection noted.  No redness noted.  Skin has completely epithelialized  Orthopedic: Normal joint  ROM without pain or crepitus bilaterally. No visible deformities. No bony tenderness.   Radiographs: None Assessment:   No diagnosis found.  Plan:  Patient was evaluated and treated and all questions answered.  Right great toe superficial wound limited to the breakdown of the skin - All questions and concerns were discussed with the patient in extensive detail.  At this time patient is completely healed and skin is completely epithelialized instructed her to stop doing Betadine wet-to-dry dressing to return to regular shoes and activities no restrictions.  She  states understands to do so immediately.  If any foot and ankle issues are future she will come back and see me. No follow-ups on file.

## 2024-02-11 ENCOUNTER — Other Ambulatory Visit

## 2024-02-11 DIAGNOSIS — E1159 Type 2 diabetes mellitus with other circulatory complications: Secondary | ICD-10-CM

## 2024-02-11 DIAGNOSIS — E7800 Pure hypercholesterolemia, unspecified: Secondary | ICD-10-CM | POA: Diagnosis not present

## 2024-02-11 LAB — HEPATIC FUNCTION PANEL
ALT: 27 U/L (ref 0–35)
AST: 23 U/L (ref 0–37)
Albumin: 4.2 g/dL (ref 3.5–5.2)
Alkaline Phosphatase: 50 U/L (ref 39–117)
Bilirubin, Direct: 0.2 mg/dL (ref 0.0–0.3)
Total Bilirubin: 0.8 mg/dL (ref 0.2–1.2)
Total Protein: 6.1 g/dL (ref 6.0–8.3)

## 2024-02-11 LAB — LIPID PANEL
Cholesterol: 153 mg/dL (ref 0–200)
HDL: 53.6 mg/dL (ref >39.00)
LDL Cholesterol: 80 mg/dL (ref 0–99)
NonHDL: 99.26
Total CHOL/HDL Ratio: 3
Triglycerides: 95 mg/dL (ref 0.0–149.0)
VLDL: 19 mg/dL (ref 0.0–40.0)

## 2024-02-11 LAB — TSH: TSH: 1.67 u[IU]/mL (ref 0.35–5.50)

## 2024-02-11 LAB — BASIC METABOLIC PANEL WITH GFR
BUN: 18 mg/dL (ref 6–23)
CO2: 27 meq/L (ref 19–32)
Calcium: 9.3 mg/dL (ref 8.4–10.5)
Chloride: 107 meq/L (ref 96–112)
Creatinine, Ser: 0.58 mg/dL (ref 0.40–1.20)
GFR: 89.26 mL/min (ref >60.00)
Glucose, Bld: 159 mg/dL — ABNORMAL HIGH (ref 70–99)
Potassium: 4.1 meq/L (ref 3.5–5.1)
Sodium: 142 meq/L (ref 135–145)

## 2024-02-11 LAB — HEMOGLOBIN A1C: Hgb A1c MFr Bld: 7 % — ABNORMAL HIGH (ref 4.6–6.5)

## 2024-02-11 LAB — MICROALBUMIN / CREATININE URINE RATIO
Creatinine,U: 90 mg/dL
Microalb Creat Ratio: UNDETERMINED mg/g (ref 0.0–30.0)
Microalb, Ur: <0.7 mg/dL

## 2024-02-14 ENCOUNTER — Ambulatory Visit (INDEPENDENT_AMBULATORY_CARE_PROVIDER_SITE_OTHER): Admitting: Internal Medicine

## 2024-02-14 ENCOUNTER — Encounter: Payer: Self-pay | Admitting: Internal Medicine

## 2024-02-14 VITALS — BP 128/70 | HR 68 | Resp 16 | Ht 65.0 in | Wt 177.8 lb

## 2024-02-14 DIAGNOSIS — Z0000 Encounter for general adult medical examination without abnormal findings: Secondary | ICD-10-CM

## 2024-02-14 DIAGNOSIS — E7800 Pure hypercholesterolemia, unspecified: Secondary | ICD-10-CM | POA: Diagnosis not present

## 2024-02-14 DIAGNOSIS — Z8673 Personal history of transient ischemic attack (TIA), and cerebral infarction without residual deficits: Secondary | ICD-10-CM

## 2024-02-14 DIAGNOSIS — D696 Thrombocytopenia, unspecified: Secondary | ICD-10-CM | POA: Diagnosis not present

## 2024-02-14 DIAGNOSIS — R519 Headache, unspecified: Secondary | ICD-10-CM

## 2024-02-14 DIAGNOSIS — E1159 Type 2 diabetes mellitus with other circulatory complications: Secondary | ICD-10-CM | POA: Diagnosis not present

## 2024-02-14 LAB — HM DIABETES FOOT EXAM

## 2024-02-14 MED ORDER — GLIPIZIDE ER 5 MG PO TB24: 5.0000 mg | ORAL_TABLET | Freq: Every day | ORAL | 1 refills | Status: DC

## 2024-02-14 NOTE — Progress Notes (Signed)
 Subjective:    [name], female    DOB: 09/05/1950, 73 y.o.   MRN: [id number]  Patient here for  Chief Complaint  Patient presents with   Annual Exam    HPI Here for a physical exam. Seeing podiatry (last seen 02/04/24 ) - f/u right great toe wound - healed. Saw neurology 11/04/23 - headaches - chronic sinusitis with right sided headaches. MRI and CT - no acute abnormalities. Followed by ENT. Recommended steam inhalation, change air filters, etc. Started on daily oral vitamin d . She reports her head is better. Did not require surgery. Drained on its own. No headache. No chest pain or sob reported. No abdominal pain or bowel change reported (now). Overall doing well. Stays active.    Past Medical History:  Diagnosis Date   Colon polyps    hyperplastic   Diabetes mellitus without complication (HCC)    Diverticulosis    Hx of adenomatous colonic polyps    tubular adenoma   Hypercholesterolemia    Serrated adenoma of colon    Stroke (cerebrum) (HCC)    Thrombocytopenia (HCC)    Uterus descensus    Past Surgical History:  Procedure Laterality Date   ABDOMINAL HYSTERECTOMY  2009   BACK SURGERY  05/2022   CHOLECYSTECTOMY  2007   COLONOSCOPY N/A 04/05/2015   Procedure: COLONOSCOPY;  Surgeon: Gladis RAYMOND Mariner, MD;  Location: [facility] ENDOSCOPY;  Service: Endoscopy;  Laterality: N/A;   COLONOSCOPY WITH PROPOFOL  N/A 10/09/2016   Procedure: COLONOSCOPY WITH PROPOFOL ;  Surgeon: Mariner Gladis RAYMOND, MD;  Location: [facility] ENDOSCOPY;  Service: Endoscopy;  Laterality: N/A;   COLONOSCOPY WITH PROPOFOL  N/A 12/02/2019   Procedure: COLONOSCOPY WITH PROPOFOL ;  Surgeon: Toledo, Ladell POUR, MD;  Location: [facility] ENDOSCOPY;  Service: Gastroenterology;  Laterality: N/A;   DILATION AND CURETTAGE OF UTERUS  2001 and 2006   FOOT SURGERY  2007   TONSILLECTOMY  1960   TUBAL LIGATION     vocal cord cyst removed     Family History  Problem Relation Age of Onset   Colon cancer Mother     Diabetes Mother    Kidney disease Mother    Heart disease Mother    Prostate cancer Father    Diabetes Father    Glaucoma Father    Uterine cancer Maternal Aunt    Breast cancer Paternal Aunt        x2   Stroke Paternal Grandmother    Stroke Paternal Grandfather    Diabetes Brother    Diabetes Brother    Diabetes Other    Social History   Socioeconomic History   Marital status: Married    Spouse name: Not on file   Number of children: Not on file   Years of education: Not on file   Highest education level: Not on file  Occupational History   Not on file  Tobacco Use   Smoking status: Never   Smokeless tobacco: Never  Vaping Use   Vaping status: Never Used  Substance and Sexual Activity   Alcohol use: No    Alcohol/week: 0.0 standard drinks of alcohol   Drug use: No   Sexual activity: Not Currently  Other Topics Concern   Not on file  Social History Narrative   Married   Social Drivers of Health   Financial Resource Strain: Low Risk  (11/04/2023)   Received from [facility]   Overall Financial Resource Strain (CARDIA)    Difficulty of Paying Living Expenses:  Not hard at all  Food Insecurity: No Food Insecurity (11/04/2023)   Received from [facility]   Hunger Vital Sign    Within the past 12 months, you worried that your food would run out before you got the money to buy more.: Never true    Within the past 12 months, the food you bought just didn't last and you didn't have money to get more.: Never true  Transportation Needs: No Transportation Needs (11/04/2023)   Received from [facility] - Transportation    In the past 12 months, has lack of transportation kept you from medical appointments or from getting medications?: No    Lack of Transportation (Non-Medical): No  Physical Activity: Inactive (06/14/2023)   Exercise Vital Sign    Days of Exercise per Week: 0 days    Minutes of Exercise per Session:  0 min  Stress: No Stress Concern Present (06/14/2023)   [employer] of Occupational Health - Occupational Stress Questionnaire    Feeling of Stress : Not at all  Social Connections: Socially Integrated (06/14/2023)   Social Connection and Isolation Panel    Frequency of Communication with Friends and Family: More than three times a week    Frequency of Social Gatherings with Friends and Family: More than three times a week    Attends Religious Services: More than 4 times per year    Active Member of [employer] or Organizations: Yes    Attends Engineer, structural: More than 4 times per year    Marital Status: Married     Review of Systems  Constitutional:  Negative for appetite change and unexpected weight change.  HENT:  Negative for congestion, sinus pressure and sore throat.   Eyes:  Negative for pain and visual disturbance.  Respiratory:  Negative for cough, chest tightness and shortness of breath.   Cardiovascular:  Negative for chest pain, palpitations and leg swelling.  Gastrointestinal:  Negative for abdominal pain, diarrhea, nausea and vomiting.  Genitourinary:  Negative for difficulty urinating and dysuria.  Musculoskeletal:  Negative for joint swelling and myalgias.  Skin:  Negative for color change and rash.  Neurological:  Negative for dizziness and headaches.  Hematological:  Negative for adenopathy. Does not bruise/bleed easily.  Psychiatric/Behavioral:  Negative for agitation and dysphoric mood.        Objective:     BP 128/70   Pulse 68   Resp 16   Ht 5' 5 (1.651 m)   Wt 177 lb 12.8 oz (80.6 kg)   SpO2 98%   BMI 29.59 kg/m  Wt Readings from Last 3 Encounters:  02/14/24 177 lb 12.8 oz (80.6 kg)  10/11/23 179 lb (81.2 kg)  09/26/23 177 lb 8 oz (80.5 kg)    Physical Exam Vitals reviewed.  Constitutional:      General: She is not in acute distress.    Appearance: Normal appearance. She is well-developed.  HENT:     Head: Normocephalic and  atraumatic.     Right Ear: External ear normal.     Left Ear: External ear normal.     Mouth/Throat:     Pharynx: No oropharyngeal exudate or posterior oropharyngeal erythema.  Eyes:     General: No scleral icterus.       Right eye: No discharge.        Left eye: No discharge.     Conjunctiva/sclera: Conjunctivae normal.  Neck:     Thyroid : No thyromegaly.  Cardiovascular:     Rate and Rhythm: Normal rate and regular rhythm.  Pulmonary:     Effort: No tachypnea, accessory muscle usage or respiratory distress.     Breath sounds: Normal breath sounds. No decreased breath sounds or wheezing.  Chest:  Breasts:    Right: No inverted nipple, mass, nipple discharge or tenderness (no axillary adenopathy).     Left: No inverted nipple, mass, nipple discharge or tenderness (no axilarry adenopathy).  Abdominal:     General: Bowel sounds are normal.     Palpations: Abdomen is soft.     Tenderness: There is no abdominal tenderness.  Musculoskeletal:        General: No swelling or tenderness.     Cervical back: Neck supple.  Lymphadenopathy:     Cervical: No cervical adenopathy.  Skin:    Findings: No erythema or rash.  Neurological:     Mental Status: She is alert and oriented to person, place, and time.  Psychiatric:        Mood and Affect: Mood normal.        Behavior: Behavior normal.      Diabetic foot exam was performed with the following findings:   No deformities, ulcerations, or other skin breakdown Normal sensation of 10g monofilament Intact posterior tibialis and dorsalis pedis pulses      Outpatient Encounter Medications as of 02/14/2024  Medication Sig   Cholecalciferol 25 MCG (1000 UT) tablet Take 1,000 Units by mouth daily.  (Patient taking differently: Take 2,000 Units by mouth daily.)   aspirin 81 MG tablet Take 81 mg by mouth daily.   Continuous Glucose Sensor (FREESTYLE LIBRE 3 PLUS SENSOR) MISC CHANGE SENSOR EVERY 15 DAYS.   dorzolamide -timolol  (COSOPT )  22.3-6.8 MG/ML ophthalmic solution Place 1 drop into both eyes 2 (two) times daily.   glipiZIDE  (GLUCOTROL  XL) 5 MG 24 hr tablet Take 1 tablet (5 mg total) by mouth daily with breakfast.   glucose blood (ONETOUCH VERIO) test strip USE AS INSTRUCTED TO CHECK BLOOD SUGARS DAILY. DX E11.9   latanoprost  (XALATAN ) 0.005 % ophthalmic solution 1 drop at bedtime.   Multiple Vitamins-Minerals (MULTIVITAMIN ADULT PO) Take by mouth daily.   Probiotic Product (PROBIOTIC DAILY) CAPS Take 1 capsule by mouth daily.   rosuvastatin  (CRESTOR ) 5 MG tablet Take 1 tablet (5 mg total) by mouth daily.   [DISCONTINUED] doxycycline  (VIBRA -TABS) 100 MG tablet Take 1 tablet (100 mg total) by mouth 2 (two) times daily.   [DISCONTINUED] glipiZIDE  (GLUCOTROL  XL) 5 MG 24 hr tablet Take 1 tablet (5 mg total) by mouth daily with breakfast.   [DISCONTINUED] methylPREDNISolone  (MEDROL  DOSEPAK) 4 MG TBPK tablet Medrol  dose pak - 6 day taper - take as directed.   [DISCONTINUED] saccharomyces boulardii (FLORASTOR) 250 MG capsule Take 1 capsule (250 mg total) by mouth daily.   No facility-administered encounter medications on file as of 02/14/2024.     Lab Results  Component Value Date   WBC 6.3 09/26/2023   HGB 14.8 09/26/2023   HCT 43.9 09/26/2023   PLT 172.0 09/26/2023   GLUCOSE 159 (H) 02/11/2024   CHOL 153 02/11/2024   TRIG 95.0 02/11/2024   HDL 53.60 02/11/2024   LDLDIRECT 93.0 06/26/2022   LDLCALC 80 02/11/2024   ALT 27 02/11/2024   AST 23 02/11/2024   NA 142 02/11/2024   K 4.1 02/11/2024   CL 107 02/11/2024   CREATININE 0.58 02/11/2024   BUN 18 02/11/2024   CO2 27 02/11/2024   TSH 1.67 02/11/2024  HGBA1C 7.0 (H) 02/11/2024   MICROALBUR <0.7 02/11/2024    MM 3D SCREENING MAMMOGRAM BILATERAL BREAST Result Date: 12/27/2023 CLINICAL DATA:  Screening. EXAM: DIGITAL SCREENING BILATERAL MAMMOGRAM WITH TOMOSYNTHESIS AND CAD TECHNIQUE: Bilateral screening digital craniocaudal and mediolateral oblique mammograms were  obtained. Bilateral screening digital breast tomosynthesis was performed. The images were evaluated with computer-aided detection. COMPARISON:  Previous exam(s). ACR Breast Density Category b: There are scattered areas of fibroglandular density. FINDINGS: There are no findings suspicious for malignancy. IMPRESSION: No mammographic evidence of malignancy. A result letter of this screening mammogram will be mailed directly to the patient. RECOMMENDATION: Screening mammogram in one year. (Code:SM-B-01Y) BI-RADS CATEGORY  1: Negative. Electronically Signed   By: Dina  Arceo M.D.   On: 12/27/2023 14:28       Assessment & Plan:  Routine general medical examination at a health care facility  Hypercholesteremia Assessment & Plan: Continue crestor .  Low cholesterol diet and exercise.  Discussed recent labs. No changes in medication. Follow lipid panel  Lab Results  Component Value Date   CHOL 153 02/11/2024   HDL 53.60 02/11/2024   LDLCALC 80 02/11/2024   LDLDIRECT 93.0 06/26/2022   TRIG 95.0 02/11/2024   CHOLHDL 3 02/11/2024     Orders: -     Lipid panel; Future -     Hepatic function panel; Future -     Basic metabolic panel with GFR; Future  Type 2 diabetes mellitus with vascular disease (HCC) Assessment & Plan: Bowels better off metformin .  Did not tolerate jardiance  or rybelsus .  Have discussed monitoring carb intake.  She has been watching her diet. Reviewed sugars - CGM - 02/01/24 - 02/14/24 - target range 91% high 8%with very high 1%. (Time active 93%). A1c improved - 7.0% on recent check. Continues on glipizide . Hold on making changes. Follow sugars. Continue diet and exercise. Follow met b and A1c.   Orders: -     Hemoglobin A1c; Future  Health care maintenance Assessment & Plan: Physical today 02/14/24.  Mammogram 12/25/23 - birads I.  Colonoscopy 12/02/19 - moderate diverticulosis otherwise normal.  Bone density 12/03/19 - normal.    Thrombocytopenia (HCC) Assessment & Plan: Platelet  count wnl 09/2023.    History of CVA (cerebrovascular accident) Assessment & Plan: Continue daily aspirin.    Nonintractable headache, unspecified chronicity pattern, unspecified headache type Assessment & Plan: Resolved as outlined.    Other orders -     glipiZIDE  ER; Take 1 tablet (5 mg total) by mouth daily with breakfast.  Dispense: 90 tablet; Refill: 1     Allena Hamilton, MD

## 2024-02-14 NOTE — Assessment & Plan Note (Signed)
 Platelet count wnl 09/2023.

## 2024-02-14 NOTE — Assessment & Plan Note (Signed)
 Bowels better off metformin .  Did not tolerate jardiance  or rybelsus .  Have discussed monitoring carb intake.  She has been watching her diet. Reviewed sugars - CGM - 02/01/24 - 02/14/24 - target range 91% high 8%with very high 1%. (Time active 93%). A1c improved - 7.0% on recent check. Continues on glipizide . Hold on making changes. Follow sugars. Continue diet and exercise. Follow met b and A1c.

## 2024-02-14 NOTE — Assessment & Plan Note (Signed)
 Physical today 02/14/24.  Mammogram 12/25/23 - birads I.  Colonoscopy 12/02/19 - moderate diverticulosis otherwise normal.  Bone density 12/03/19 - normal.

## 2024-02-14 NOTE — Assessment & Plan Note (Signed)
 Resolved as outlined.

## 2024-02-14 NOTE — Assessment & Plan Note (Signed)
 Continue crestor .  Low cholesterol diet and exercise.  Discussed recent labs. No changes in medication. Follow lipid panel  Lab Results  Component Value Date   CHOL 153 02/11/2024   HDL 53.60 02/11/2024   LDLCALC 80 02/11/2024   LDLDIRECT 93.0 06/26/2022   TRIG 95.0 02/11/2024   CHOLHDL 3 02/11/2024

## 2024-02-14 NOTE — Assessment & Plan Note (Signed)
Continue daily aspirin 

## 2024-02-25 DIAGNOSIS — H2513 Age-related nuclear cataract, bilateral: Secondary | ICD-10-CM | POA: Diagnosis not present

## 2024-02-25 DIAGNOSIS — H401123 Primary open-angle glaucoma, left eye, severe stage: Secondary | ICD-10-CM | POA: Diagnosis not present

## 2024-02-25 DIAGNOSIS — H401111 Primary open-angle glaucoma, right eye, mild stage: Secondary | ICD-10-CM | POA: Diagnosis not present

## 2024-02-25 DIAGNOSIS — H353131 Nonexudative age-related macular degeneration, bilateral, early dry stage: Secondary | ICD-10-CM | POA: Diagnosis not present

## 2024-02-27 ENCOUNTER — Other Ambulatory Visit: Payer: Self-pay | Admitting: Internal Medicine

## 2024-02-28 ENCOUNTER — Encounter: Payer: Self-pay | Admitting: Podiatry

## 2024-02-28 ENCOUNTER — Ambulatory Visit: Admitting: Podiatry

## 2024-02-28 DIAGNOSIS — M79674 Pain in right toe(s): Secondary | ICD-10-CM

## 2024-02-28 DIAGNOSIS — B351 Tinea unguium: Secondary | ICD-10-CM

## 2024-02-28 DIAGNOSIS — E119 Type 2 diabetes mellitus without complications: Secondary | ICD-10-CM

## 2024-03-03 NOTE — Progress Notes (Unsigned)
  Subjective:  [name], female    DOB: 28-Feb-1951,  MRN: [id number]  Victoria Harris presents to clinic today for preventative diabetic foot care for painful thick toenails that are difficult to trim. Pain interferes with ambulation. Aggravating factors include wearing enclosed shoe gear. Pain is relieved with periodic professional debridement. Patient had her great toenail removed from her right foot. She was followed by Dr. Tobie and area has healed now. Chief Complaint  Patient presents with   Toe Pain    Diabeteci foot care. Dr. Glendia is her PCP. 7.0 A1c   New problem(s): None.   PCP is Glendia Shad, MD.  Allergies  Allergen Reactions   Glucophage  Xr [Metformin ]     diarrhea   Jardiance  [Empagliflozin ]     Seen 09/07/21 on this day Stopped jardiance  due to pt easily dehydrates at baseline and felt like this was making this worse and causing to feel like she was going to pass out or pass out    Tylenol  [Acetaminophen ] Hives    Review of Systems: Negative except as noted in the HPI.  Objective:  There were no vitals filed for this visit. Victoria Harris is a pleasant 73 y.o. female WD, WN in NAD. AAO x 3.  Vascular Examination: Capillary refill time immediate b/l. Palpable pedal pulses. Pedal hair present b/l. No pain with calf compression b/l. Skin temperature gradient WNL b/l. No cyanosis or clubbing b/l. No ischemia or gangrene noted b/l. No varicosities noted.  Neurological Examination: Sensation grossly intact b/l with 10 gram monofilament. Vibratory sensation intact b/l.   Dermatological Examination: Pedal skin with normal turgor, texture and tone b/l.  No open wounds. No interdigital macerations.   Toenails 2-5 b/l thick, discolored, elongated with subungual debris and pain on dorsal palpation.   Anonychia noted bilateral great toes. Nailbed(s) epithelialized.  Evidence of total matrixectomy bilateral great  toes.  Musculoskeletal Examination: Muscle strength 5/5 to all lower extremity muscle groups bilaterally. No pain, crepitus or joint limitation noted with ROM bilateral LE. No gross bony deformities bilaterally.  Radiographs: None Assessment/Plan: 1. Pain due to onychomycosis of toenail of right foot   2. Diabetes mellitus without complication [facility])   Patient was evaluated and treated. All patient's and/or POA's questions/concerns addressed on today's visit. Toenails b/l 2-5 debrided in length and girth without incident. Continue foot and shoe inspections daily. Monitor blood glucose per PCP/Endocrinologist's recommendations. Continue soft, supportive shoe gear daily. Report any pedal injuries to medical professional. Call office if there are any questions/concerns.  Return in about 3 months (around 05/30/2024).  Delon LITTIE Merlin, DPM      [place] LOCATION: [address] [place] [address]                   Office [phone]   [facility] LOCATION: [address] [place], [place] [address] Office [phone]

## 2024-03-03 NOTE — Progress Notes (Signed)
 Victoria Harris                                          MRN: [id number]   03/03/2024   The [facility] Quality Team Specialist reviewed this patient medical record for the purposes of chart review for care gap closure. The following were reviewed: abstraction for care gap closure-kidney health evaluation for diabetes:eGFR  and uACR.    [facility]BCI Quality Team

## 2024-06-04 ENCOUNTER — Ambulatory Visit: Admitting: Podiatry

## 2024-06-04 ENCOUNTER — Encounter: Payer: Self-pay | Admitting: Podiatry

## 2024-06-04 DIAGNOSIS — E119 Type 2 diabetes mellitus without complications: Secondary | ICD-10-CM | POA: Diagnosis not present

## 2024-06-04 DIAGNOSIS — M79674 Pain in right toe(s): Secondary | ICD-10-CM

## 2024-06-04 DIAGNOSIS — B351 Tinea unguium: Secondary | ICD-10-CM

## 2024-06-08 ENCOUNTER — Encounter: Payer: Self-pay | Admitting: *Deleted

## 2024-06-11 NOTE — Progress Notes (Signed)
"  °  Subjective:  [name], female    DOB: 1950/10/05,  MRN: [id number]  Victoria Harris presents to clinic today for preventative diabetic foot care for painful, discolored, thick toenails which interfere with daily activities  Chief Complaint  Patient presents with   Nail Problem    Thick painful toenails, 3 month follow up    Diabetes    A1C 7.0    New problem(s): None.   PCP is Glendia Shad, MD. ARNETTA 10/11/23.  Allergies[1]  Review of Systems: Negative except as noted in the HPI.  Objective: No changes noted in today's physical examination. There were no vitals filed for this visit. Victoria Harris is a pleasant 74 y.o. female WD, WN in NAD. AAO x 3.  Vascular Examination: Capillary refill time immediate b/l. Palpable pedal pulses. Pedal hair present b/l. No pain with calf compression b/l. Skin temperature gradient WNL b/l. No cyanosis or clubbing b/l. No ischemia or gangrene noted b/l. No varicosities noted.  Neurological Examination: Sensation grossly intact b/l with 10 gram monofilament. Vibratory sensation intact b/l.   Dermatological Examination: Pedal skin with normal turgor, texture and tone b/l.  No open wounds. No interdigital macerations.   Toenails 2-5 b/l thick, discolored, elongated with subungual debris and pain on dorsal palpation.   Anonychia noted bilateral great toes. Nailbed(s) epithelialized.  Evidence of total matrixectomy bilateral great toes.  Musculoskeletal Examination: Muscle strength 5/5 to all lower extremity muscle groups bilaterally. No pain, crepitus or joint limitation noted with ROM bilateral LE. No gross bony deformities bilaterally.  Radiographs: None  Assessment/Plan: 1. Pain due to onychomycosis of toenail of right foot   2. Diabetes mellitus without complication (HCC)    -Consent given for treatment as described below: -Examined patient. -Continue foot and shoe inspections daily. Monitor  blood glucose per PCP/Endocrinologist's recommendations. -Patient to continue soft, supportive shoe gear daily. -Mycotic toenails 2-5 bilaterally were debrided in length and girth with sterile nail nippers and dremel without iatrogenic bleeding. -Patient/POA to call should there be question/concern in the interim.   Return in about 3 months (around 09/02/2024).  Delon LITTIE Merlin, DPM      [place] LOCATION: [address] [place] [address]                   Office [phone]   [place] LOCATION: [address] [place], [place] [address] Office [phone]      [1]  Allergies Allergen Reactions   Glucophage  Xr [Metformin ]     diarrhea   Jardiance  [Empagliflozin ]     Seen 09/07/21 on this day Stopped jardiance  due to pt easily dehydrates at baseline and felt like this was making this worse and causing to feel like she was going to pass out or pass out    Tylenol  [Acetaminophen ] Hives   "

## 2024-06-16 ENCOUNTER — Other Ambulatory Visit

## 2024-06-16 DIAGNOSIS — E7800 Pure hypercholesterolemia, unspecified: Secondary | ICD-10-CM | POA: Diagnosis not present

## 2024-06-16 DIAGNOSIS — E1159 Type 2 diabetes mellitus with other circulatory complications: Secondary | ICD-10-CM

## 2024-06-16 LAB — BASIC METABOLIC PANEL WITH GFR
BUN: 18 mg/dL (ref 6–23)
CO2: 29 meq/L (ref 19–32)
Calcium: 9.3 mg/dL (ref 8.4–10.5)
Chloride: 106 meq/L (ref 96–112)
Creatinine, Ser: 0.6 mg/dL (ref 0.40–1.20)
GFR: 88.32 mL/min
Glucose, Bld: 176 mg/dL — ABNORMAL HIGH (ref 70–99)
Potassium: 4.1 meq/L (ref 3.5–5.1)
Sodium: 139 meq/L (ref 135–145)

## 2024-06-16 LAB — LIPID PANEL
Cholesterol: 163 mg/dL (ref 28–200)
HDL: 53.3 mg/dL
LDL Cholesterol: 85 mg/dL (ref 10–99)
NonHDL: 109.4
Total CHOL/HDL Ratio: 3
Triglycerides: 124 mg/dL (ref 10.0–149.0)
VLDL: 24.8 mg/dL (ref 0.0–40.0)

## 2024-06-16 LAB — HEPATIC FUNCTION PANEL
ALT: 22 U/L (ref 3–35)
AST: 24 U/L (ref 5–37)
Albumin: 4.3 g/dL (ref 3.5–5.2)
Alkaline Phosphatase: 61 U/L (ref 39–117)
Bilirubin, Direct: 0.1 mg/dL (ref 0.1–0.3)
Total Bilirubin: 0.8 mg/dL (ref 0.2–1.2)
Total Protein: 6.4 g/dL (ref 6.0–8.3)

## 2024-06-16 LAB — HEMOGLOBIN A1C: Hgb A1c MFr Bld: 7.5 % — ABNORMAL HIGH (ref 4.6–6.5)

## 2024-06-17 ENCOUNTER — Ambulatory Visit: Payer: Self-pay | Admitting: Internal Medicine

## 2024-06-17 ENCOUNTER — Ambulatory Visit: Payer: PPO | Admitting: *Deleted

## 2024-06-17 VITALS — BP 124/78 | Ht 65.0 in | Wt 179.0 lb

## 2024-06-17 DIAGNOSIS — Z0000 Encounter for general adult medical examination without abnormal findings: Secondary | ICD-10-CM | POA: Diagnosis not present

## 2024-06-17 NOTE — Patient Instructions (Signed)
 Victoria Harris,  Thank you for taking the time for your Medicare Wellness Visit. I appreciate your continued commitment to your health goals. Please review the care plan we discussed, and feel free to reach out if I can assist you further.  Please note that Annual Wellness Visits do not include a physical exam. Some assessments may be limited, especially if the visit was conducted virtually. If needed, we may recommend an in-person follow-up with your provider.  Ongoing Care Seeing your primary care provider every 3 to 6 months helps us  monitor your health and provide consistent, personalized care.  Consider having a Dexa scan.   Referrals If a referral was made during today's visit and you haven't received any updates within two weeks, please contact the referred provider directly to check on the status.  Recommended Screenings:  Health Maintenance  Topic Date Due   Flu Shot  08/25/2024*   Kidney health urinalysis for diabetes  08/10/2024   Eye exam for diabetics  08/15/2024   Colon Cancer Screening  12/01/2024   Hemoglobin A1C  12/14/2024   Breast Cancer Screening  12/24/2024   Complete foot exam   02/13/2025   Yearly kidney function blood test for diabetes  06/16/2025   Medicare Annual Wellness Visit  06/17/2025   Pneumococcal Vaccine for age over 19  Completed   Osteoporosis screening with Bone Density Scan  Completed   Hepatitis C Screening  Completed   Zoster (Shingles) Vaccine  Completed   Meningitis B Vaccine  Aged Out   DTaP/Tdap/Td vaccine  Discontinued   COVID-19 Vaccine  Discontinued  *Topic was postponed. The date shown is not the original due date.       06/17/2024   11:51 AM  Advanced Directives  Does Patient Have a Medical Advance Directive? Yes  Type of Estate Agent of [place];Living will  Does patient want to make changes to medical advance directive? No - Patient declined  Copy of Healthcare Power of Attorney in Chart? No - copy  requested    Vision: Annual vision screenings are recommended for early detection of glaucoma, cataracts, and diabetic retinopathy. These exams can also reveal signs of chronic conditions such as diabetes and high blood pressure.  Dental: Annual dental screenings help detect early signs of oral cancer, gum disease, and other conditions linked to overall health, including heart disease and diabetes.  Please see the attached documents for additional preventive care recommendations.

## 2024-06-17 NOTE — Progress Notes (Signed)
 "  Chief Complaint  Patient presents with   Medicare Wellness     Subjective:   Victoria Harris is a 74 y.o. female who presents for a Medicare Annual Wellness Visit.  Visit info / Clinical Intake: Medicare Wellness Visit Type:: Subsequent Annual Wellness Visit Persons participating in visit and providing information:: patient Medicare Wellness Visit Mode:: In-person (required for WTM) Interpreter Needed?: No Pre-visit prep was completed: yes AWV questionnaire completed by patient prior to visit?: no Living arrangements:: lives with spouse/significant other Patient's Overall Health Status Rating: good Typical amount of pain: none Does pain affect daily life?: no Are you currently prescribed opioids?: no  Dietary Habits and Nutritional Risks How many meals a day?: 3 Eats fruit and vegetables daily?: (!) no Most meals are obtained by: preparing own meals In the last 2 weeks, have you had any of the following?: none Diabetic:: (!) yes Any non-healing wounds?: no How often do you check your BS?: continuous glucose monitor Would you like to be referred to a Nutritionist or for Diabetic Management? : no  Functional Status Activities of Daily Living (to include ambulation/medication): Independent Ambulation: Independent Medication Administration: Independent Home Management (perform basic housework or laundry): Independent Manage your own finances?: yes Primary transportation is: driving Concerns about vision?: (!) yes (glaucoma) Concerns about hearing?: no  Fall Screening Falls in the past year?: 0 Number of falls in past year: 0 Was there an injury with Fall?: 0 Fall Risk Category Calculator: 0 Patient Fall Risk Level: Low Fall Risk  Fall Risk Patient at Risk for Falls Due to: No Fall Risks Fall risk Follow up: Falls evaluation completed; Falls prevention discussed  Home and Transportation Safety: All rugs have non-skid backing?: (!) no (discussed safety) All  stairs or steps have railings?: (!) no (to the garage) Grab bars in the bathtub or shower?: (!) no Have non-skid surface in bathtub or shower?: (!) no Good home lighting?: yes Regular seat belt use?: yes Hospital stays in the last year:: no  Cognitive Assessment Difficulty concentrating, remembering, or making decisions? : no Will 6CIT or Mini Cog be Completed: yes What year is it?: 0 points What month is it?: 0 points Give patient an address phrase to remember (5 components): [address] [place] About what time is it?: 0 points Count backwards from 20 to 1: 0 points Say the months of the year in reverse: 0 points Repeat the address phrase from earlier: 0 points 6 CIT Score: 0 points  Advance Directives (For Healthcare) Does Patient Have a Medical Advance Directive?: Yes Does patient want to make changes to medical advance directive?: No - Patient declined Type of Advance Directive: Healthcare Power of [place]; Living will Copy of Healthcare Power of Attorney in Chart?: No - copy requested Copy of Living Will in Chart?: No - copy requested  Reviewed/Updated  Reviewed/Updated: Reviewed All (Medical, Surgical, Family, Medications, Allergies, Care Teams, Patient Goals)    Allergies (verified) Glucophage  xr [metformin ], Jardiance  [empagliflozin ], and Tylenol  [acetaminophen ]   Current Medications (verified) Outpatient Encounter Medications as of 06/17/2024  Medication Sig   aspirin 81 MG tablet Take 81 mg by mouth daily.   Cholecalciferol 25 MCG (1000 UT) tablet Take 1,000 Units by mouth daily.  (Patient taking differently: Take 2,000 Units by mouth daily.)   Continuous Glucose Sensor (FREESTYLE LIBRE 3 PLUS SENSOR) MISC CHANGE SENSOR EVERY 15 DAYS.   dorzolamide -timolol  (COSOPT ) 22.3-6.8 MG/ML ophthalmic solution Place 1 drop into both eyes 2 (two) times daily.  fluconazole (DIFLUCAN) 200 MG tablet Take 200 mg by mouth once a week.   glipiZIDE  (GLUCOTROL  XL) 5 MG 24 hr  tablet Take 1 tablet (5 mg total) by mouth daily with breakfast.   glucose blood (ONETOUCH VERIO) test strip USE AS INSTRUCTED TO CHECK BLOOD SUGARS DAILY. DX E11.9   hydrocortisone 2.5 % cream Apply 1 Application topically 2 (two) times daily as needed.   latanoprost  (XALATAN ) 0.005 % ophthalmic solution 1 drop at bedtime.   Multiple Vitamins-Minerals (MULTIVITAMIN ADULT PO) Take by mouth daily.   Probiotic Product (PROBIOTIC DAILY) CAPS Take 1 capsule by mouth daily.   rosuvastatin  (CRESTOR ) 5 MG tablet Take 1 tablet (5 mg total) by mouth daily. (Patient taking differently: Take 5 mg by mouth daily. Takes 3 times a week)   No facility-administered encounter medications on file as of 06/17/2024.    History: Past Medical History:  Diagnosis Date   Colon polyps    hyperplastic   Diabetes mellitus without complication (HCC)    Diverticulosis    Hx of adenomatous colonic polyps    tubular adenoma   Hypercholesterolemia    Serrated adenoma of colon    Stroke (cerebrum) (HCC)    Thrombocytopenia    Uterus descensus    Past Surgical History:  Procedure Laterality Date   ABDOMINAL HYSTERECTOMY  2009   BACK SURGERY  05/2022   CHOLECYSTECTOMY  2007   COLONOSCOPY N/A 04/05/2015   Procedure: COLONOSCOPY;  Surgeon: Gladis RAYMOND Mariner, MD;  Location: [facility] ENDOSCOPY;  Service: Endoscopy;  Laterality: N/A;   COLONOSCOPY WITH PROPOFOL  N/A 10/09/2016   Procedure: COLONOSCOPY WITH PROPOFOL ;  Surgeon: Mariner Gladis RAYMOND, MD;  Location: [facility] ENDOSCOPY;  Service: Endoscopy;  Laterality: N/A;   COLONOSCOPY WITH PROPOFOL  N/A 12/02/2019   Procedure: COLONOSCOPY WITH PROPOFOL ;  Surgeon: Toledo, Ladell POUR, MD;  Location: [facility] ENDOSCOPY;  Service: Gastroenterology;  Laterality: N/A;   DILATION AND CURETTAGE OF UTERUS  2001 and 2006   FOOT SURGERY  2007   TONSILLECTOMY  1960   TUBAL LIGATION     vocal cord cyst removed     Family History  Problem Relation Age of Onset   Colon cancer Mother     Diabetes Mother    Kidney disease Mother    Heart disease Mother    Prostate cancer Father    Diabetes Father    Glaucoma Father    Uterine cancer Maternal Aunt    Breast cancer Paternal Aunt        x2   Stroke Paternal Grandmother    Stroke Paternal Grandfather    Diabetes Brother    Diabetes Brother    Diabetes Other    Social History   Occupational History   Not on file  Tobacco Use   Smoking status: Never   Smokeless tobacco: Never  Vaping Use   Vaping status: Never Used  Substance and Sexual Activity   Alcohol use: No    Alcohol/week: 0.0 standard drinks of alcohol   Drug use: No   Sexual activity: Not Currently   Tobacco Counseling Counseling given: Not Answered  SDOH Screenings   Food Insecurity: No Food Insecurity (06/17/2024)  Housing: Low Risk (06/17/2024)  Transportation Needs: No Transportation Needs (06/17/2024)  Utilities: Not At Risk (06/17/2024)  Alcohol Screen: Low Risk (06/17/2024)  Depression (PHQ2-9): Low Risk (06/17/2024)  Financial Resource Strain: Low Risk (06/17/2024)  Physical Activity: Inactive (06/17/2024)  Social Connections: Socially Integrated (06/17/2024)  Stress: No Stress Concern Present (06/17/2024)  Tobacco Use: Low  Risk (06/17/2024)  Health Literacy: Adequate Health Literacy (06/17/2024)   See flowsheets for full screening details  Depression Screen PHQ 2 & 9 Depression Scale- Over the past 2 weeks, how often have you been bothered by any of the following problems? Little interest or pleasure in doing things: 0 Feeling down, depressed, or hopeless (PHQ Adolescent also includes...irritable): 0 PHQ-2 Total Score: 0 Trouble falling or staying asleep, or sleeping too much: 0 Feeling tired or having little energy: 0 Poor appetite or overeating (PHQ Adolescent also includes...weight loss): 0 Feeling bad about yourself - or that you are a failure or have let yourself or your family down: 0 Trouble concentrating on things, such as reading the  newspaper or watching television (PHQ Adolescent also includes...like school work): 0 Moving or speaking so slowly that other people could have noticed. Or the opposite - being so fidgety or restless that you have been moving around a lot more than usual: 0 Thoughts that you would be better off dead, or of hurting yourself in some way: 0 PHQ-9 Total Score: 0 If you checked off any problems, how difficult have these problems made it for you to do your work, take care of things at home, or get along with other people?: Not difficult at all     Goals Addressed             This Visit's Progress    Patient Stated       Wants to work on getting her diabetes under control             Objective:    Today's Vitals   06/17/24 1141  BP: 124/78  Weight: 179 lb (81.2 kg)  Height: 5' 5 (1.651 m)   Body mass index is 29.79 kg/m.  Hearing/Vision screen Hearing Screening - Comments:: No issues Vision Screening - Comments:: Glasses, [place] [facility], up to date Immunizations and Health Maintenance Health Maintenance  Topic Date Due   Influenza Vaccine  08/25/2024 (Originally 12/27/2023)   Diabetic kidney evaluation - Urine ACR  08/10/2024   OPHTHALMOLOGY EXAM  08/15/2024   Colonoscopy  12/01/2024   HEMOGLOBIN A1C  12/14/2024   Mammogram  12/24/2024   FOOT EXAM  02/13/2025   Diabetic kidney evaluation - eGFR measurement  06/16/2025   Medicare Annual Wellness (AWV)  06/17/2025   Pneumococcal Vaccine: 50+ Years  Completed   Bone Density Scan  Completed   Hepatitis C Screening  Completed   Zoster Vaccines- Shingrix  Completed   Meningococcal B Vaccine  Aged Out   DTaP/Tdap/Td  Discontinued   COVID-19 Vaccine  Discontinued        Assessment/Plan:  This is a routine wellness examination for [place].  Patient Care Team: Glendia Shad, MD as PCP - General (Internal Medicine) Gaynel Delon CROME, DPM as Consulting Physician (Podiatry) Evern Allyson Hacker, FNP  (Neurology)  I have personally reviewed and noted the following in the patients chart:   Medical and social history Use of alcohol, tobacco or illicit drugs  Current medications and supplements including opioid prescriptions. Functional ability and status Nutritional status Physical activity Advanced directives List of other physicians Hospitalizations, surgeries, and ER visits in previous 12 months Vitals Screenings to include cognitive, depression, and falls Referrals and appointments  No orders of the defined types were placed in this encounter.  In addition, I have reviewed and discussed with patient certain preventive protocols, quality metrics, and best practice recommendations. A written personalized care plan for preventive services as well as  general preventive health recommendations were provided to patient.   Angeline Fredericks, LPN   8/78/7973   Return in 1 year (on 06/17/2025).  After Visit Summary: (In Person-Printed) AVS printed and given to the patient  Nurse Notes: Patient declines flu vaccine today. Patient wants to discuss Dexa with PCP at upcoming office visit. "

## 2024-06-19 ENCOUNTER — Encounter: Payer: Self-pay | Admitting: Internal Medicine

## 2024-06-19 ENCOUNTER — Ambulatory Visit: Admitting: Internal Medicine

## 2024-06-19 VITALS — BP 120/74 | HR 59 | Temp 98.1°F | Ht 65.0 in | Wt 178.0 lb

## 2024-06-19 DIAGNOSIS — Z7984 Long term (current) use of oral hypoglycemic drugs: Secondary | ICD-10-CM | POA: Diagnosis not present

## 2024-06-19 DIAGNOSIS — R519 Headache, unspecified: Secondary | ICD-10-CM

## 2024-06-19 DIAGNOSIS — E1159 Type 2 diabetes mellitus with other circulatory complications: Secondary | ICD-10-CM | POA: Diagnosis not present

## 2024-06-19 DIAGNOSIS — Z8673 Personal history of transient ischemic attack (TIA), and cerebral infarction without residual deficits: Secondary | ICD-10-CM | POA: Diagnosis not present

## 2024-06-19 DIAGNOSIS — E7800 Pure hypercholesterolemia, unspecified: Secondary | ICD-10-CM

## 2024-06-19 DIAGNOSIS — Z860100 Personal history of colon polyps, unspecified: Secondary | ICD-10-CM | POA: Diagnosis not present

## 2024-06-19 DIAGNOSIS — F439 Reaction to severe stress, unspecified: Secondary | ICD-10-CM

## 2024-06-19 MED ORDER — GLIPIZIDE ER 5 MG PO TB24: 5.0000 mg | ORAL_TABLET | Freq: Every day | ORAL | 1 refills | Status: AC

## 2024-06-19 NOTE — Progress Notes (Signed)
 "  Subjective:    [name], female    DOB: [birth year], 74 y.o.   MRN: [id number]  Patient here for  Chief Complaint  Patient presents with   Medical Management of Chronic Issues    HPI Here for a scheduled follow up - follow up regarding diabetes, hypertension and hypercholesterolemia. Bowels better off metformin . Did not tolerate jardiance  or rybelsus . Continues on glipzide. Has been watching her diet. Reviewed CGM - 06/06/24 - 06/19/24 - 97% time active - target range 77%, high 22%, very high 1%. Overall sugars appear to be improving. Discussed recent labs.  A1c increased from last check - 7.5. stays active. No chest pain or sob reported. No abdominal pain or bowel change reported.    Past Medical History:  Diagnosis Date   Colon polyps    hyperplastic   Diabetes mellitus without complication (HCC)    Diverticulosis    Hx of adenomatous colonic polyps    tubular adenoma   Hypercholesterolemia    Serrated adenoma of colon    Stroke (cerebrum) (HCC)    Thrombocytopenia    Uterus descensus    Past Surgical History:  Procedure Laterality Date   ABDOMINAL HYSTERECTOMY  2009   BACK SURGERY  05/2022   CHOLECYSTECTOMY  2007   COLONOSCOPY N/A 04/05/2015   Procedure: COLONOSCOPY;  Surgeon: Gladis RAYMOND Mariner, MD;  Location: [facility] ENDOSCOPY;  Service: Endoscopy;  Laterality: N/A;   COLONOSCOPY WITH PROPOFOL  N/A 10/09/2016   Procedure: COLONOSCOPY WITH PROPOFOL ;  Surgeon: Mariner Gladis RAYMOND, MD;  Location: [facility] ENDOSCOPY;  Service: Endoscopy;  Laterality: N/A;   COLONOSCOPY WITH PROPOFOL  N/A 12/02/2019   Procedure: COLONOSCOPY WITH PROPOFOL ;  Surgeon: Toledo, Ladell POUR, MD;  Location: [facility] ENDOSCOPY;  Service: Gastroenterology;  Laterality: N/A;   DILATION AND CURETTAGE OF UTERUS  2001 and 2006   FOOT SURGERY  2007   TONSILLECTOMY  1960   TUBAL LIGATION     vocal cord cyst removed     Family History  Problem Relation Age of Onset   Colon cancer Mother     Diabetes Mother    Kidney disease Mother    Heart disease Mother    Prostate cancer Father    Diabetes Father    Glaucoma Father    Uterine cancer Maternal Aunt    Breast cancer Paternal Aunt        x2   Stroke Paternal Grandmother    Stroke Paternal Grandfather    Diabetes Brother    Diabetes Brother    Diabetes Other    Social History   Socioeconomic History   Marital status: Married    Spouse name: Not on file   Number of children: Not on file   Years of education: Not on file   Highest education level: Not on file  Occupational History   Not on file  Tobacco Use   Smoking status: Never   Smokeless tobacco: Never  Vaping Use   Vaping status: Never Used  Substance and Sexual Activity   Alcohol use: No    Alcohol/week: 0.0 standard drinks of alcohol   Drug use: No   Sexual activity: Not Currently  Other Topics Concern   Not on file  Social History Narrative   Married   Social Drivers of Health   Tobacco Use: Low Risk (06/22/2024)   Patient History    Smoking Tobacco Use: Never    Smokeless Tobacco Use: Never    Passive Exposure: Not on file  Financial  Resource Strain: Low Risk (06/17/2024)   Overall Financial Resource Strain (CARDIA)    Difficulty of Paying Living Expenses: Not hard at all  Food Insecurity: No Food Insecurity (06/17/2024)   Epic    Worried About Programme Researcher, Broadcasting/film/video in the Last Year: Never true    Ran Out of Food in the Last Year: Never true  Transportation Needs: No Transportation Needs (06/17/2024)   Epic    Lack of Transportation (Medical): No    Lack of Transportation (Non-Medical): No  Physical Activity: Inactive (06/17/2024)   Exercise Vital Sign    Days of Exercise per Week: 0 days    Minutes of Exercise per Session: 0 min  Stress: No Stress Concern Present (06/17/2024)   [employer] of Occupational Health - Occupational Stress Questionnaire    Feeling of Stress: Not at all  Social Connections: Socially Integrated (06/17/2024)    Social Connection and Isolation Panel    Frequency of Communication with Friends and Family: More than three times a week    Frequency of Social Gatherings with Friends and Family: More than three times a week    Attends Religious Services: More than 4 times per year    Active Member of Clubs or Organizations: Yes    Attends Banker Meetings: More than 4 times per year    Marital Status: Married  Depression (PHQ2-9): Low Risk (06/17/2024)   Depression (PHQ2-9)    PHQ-2 Score: 0  Alcohol Screen: Low Risk (06/17/2024)   Alcohol Screen    Last Alcohol Screening Score (AUDIT): 0  Housing: Low Risk (06/17/2024)   Epic    Unable to Pay for Housing in the Last Year: No    Number of Times Moved in the Last Year: 0    Homeless in the Last Year: No  Utilities: Not At Risk (06/17/2024)   Epic    Threatened with loss of utilities: No  Health Literacy: Adequate Health Literacy (06/17/2024)   B1300 Health Literacy    Frequency of need for help with medical instructions: Never     Review of Systems  Constitutional:  Negative for appetite change and unexpected weight change.  HENT:  Negative for congestion and sinus pressure.   Respiratory:  Negative for cough, chest tightness and shortness of breath.   Cardiovascular:  Negative for chest pain, palpitations and leg swelling.  Gastrointestinal:  Negative for abdominal pain, diarrhea, nausea and vomiting.  Genitourinary:  Negative for difficulty urinating and dysuria.  Musculoskeletal:  Negative for joint swelling and myalgias.  Skin:  Negative for color change and rash.  Neurological:  Negative for dizziness and headaches.  Psychiatric/Behavioral:  Negative for agitation and dysphoric mood.        Objective:     BP 120/74   Pulse (!) 59   Temp 98.1 F (36.7 C) (Oral)   Ht 5' 5 (1.651 m)   Wt 178 lb (80.7 kg)   SpO2 98%   BMI 29.62 kg/m  Wt Readings from Last 3 Encounters:  06/19/24 178 lb (80.7 kg)  06/17/24 179 lb  (81.2 kg)  02/14/24 177 lb 12.8 oz (80.6 kg)    Physical Exam Vitals reviewed.  Constitutional:      General: She is not in acute distress.    Appearance: Normal appearance.  HENT:     Head: Normocephalic and atraumatic.     Right Ear: External ear normal.     Left Ear: External ear normal.     Mouth/Throat:  Pharynx: No oropharyngeal exudate or posterior oropharyngeal erythema.  Eyes:     General: No scleral icterus.       Right eye: No discharge.        Left eye: No discharge.     Conjunctiva/sclera: Conjunctivae normal.  Neck:     Thyroid : No thyromegaly.  Cardiovascular:     Rate and Rhythm: Normal rate and regular rhythm.  Pulmonary:     Effort: No respiratory distress.     Breath sounds: Normal breath sounds. No wheezing.  Abdominal:     General: Bowel sounds are normal.     Palpations: Abdomen is soft.     Tenderness: There is no abdominal tenderness.  Musculoskeletal:        General: No swelling or tenderness.     Cervical back: Neck supple. No tenderness.  Lymphadenopathy:     Cervical: No cervical adenopathy.  Skin:    Findings: No erythema or rash.  Neurological:     Mental Status: She is alert.  Psychiatric:        Mood and Affect: Mood normal.        Behavior: Behavior normal.         Outpatient Encounter Medications as of 06/19/2024  Medication Sig   aspirin 81 MG tablet Take 81 mg by mouth daily.   Cholecalciferol 25 MCG (1000 UT) tablet Take 1,000 Units by mouth daily.  (Patient taking differently: Take 2,000 Units by mouth daily.)   Continuous Glucose Sensor (FREESTYLE LIBRE 3 PLUS SENSOR) MISC CHANGE SENSOR EVERY 15 DAYS.   dorzolamide -timolol  (COSOPT ) 22.3-6.8 MG/ML ophthalmic solution Place 1 drop into both eyes 2 (two) times daily.   fluconazole (DIFLUCAN) 200 MG tablet Take 200 mg by mouth once a week.   glucose blood (ONETOUCH VERIO) test strip USE AS INSTRUCTED TO CHECK BLOOD SUGARS DAILY. DX E11.9   hydrocortisone 2.5 % cream Apply 1  Application topically 2 (two) times daily as needed.   latanoprost  (XALATAN ) 0.005 % ophthalmic solution 1 drop at bedtime.   Multiple Vitamins-Minerals (MULTIVITAMIN ADULT PO) Take by mouth daily.   Probiotic Product (PROBIOTIC DAILY) CAPS Take 1 capsule by mouth daily.   rosuvastatin  (CRESTOR ) 5 MG tablet Take 1 tablet (5 mg total) by mouth daily. (Patient taking differently: Take 5 mg by mouth daily. Takes 3 times a week (MONDAY, WEDNESDAY, FRIDAY))   glipiZIDE  (GLUCOTROL  XL) 5 MG 24 hr tablet Take 1 tablet (5 mg total) by mouth daily with breakfast.   [DISCONTINUED] glipiZIDE  (GLUCOTROL  XL) 5 MG 24 hr tablet Take 1 tablet (5 mg total) by mouth daily with breakfast.   No facility-administered encounter medications on file as of 06/19/2024.     Lab Results  Component Value Date   WBC 6.3 09/26/2023   HGB 14.8 09/26/2023   HCT 43.9 09/26/2023   PLT 172.0 09/26/2023   GLUCOSE 176 (H) 06/16/2024   CHOL 163 06/16/2024   TRIG 124.0 06/16/2024   HDL 53.30 06/16/2024   LDLDIRECT 93.0 06/26/2022   LDLCALC 85 06/16/2024   ALT 22 06/16/2024   AST 24 06/16/2024   NA 139 06/16/2024   K 4.1 06/16/2024   CL 106 06/16/2024   CREATININE 0.60 06/16/2024   BUN 18 06/16/2024   CO2 29 06/16/2024   TSH 1.67 02/11/2024   HGBA1C 7.5 (H) 06/16/2024   MICROALBUR <0.7 02/11/2024    MM 3D SCREENING MAMMOGRAM BILATERAL BREAST Result Date: 12/27/2023 CLINICAL DATA:  Screening. EXAM: DIGITAL SCREENING BILATERAL MAMMOGRAM WITH TOMOSYNTHESIS AND CAD  TECHNIQUE: Bilateral screening digital craniocaudal and mediolateral oblique mammograms were obtained. Bilateral screening digital breast tomosynthesis was performed. The images were evaluated with computer-aided detection. COMPARISON:  Previous exam(s). ACR Breast Density Category b: There are scattered areas of fibroglandular density. FINDINGS: There are no findings suspicious for malignancy. IMPRESSION: No mammographic evidence of malignancy. A result letter of  this screening mammogram will be mailed directly to the patient. RECOMMENDATION: Screening mammogram in one year. (Code:SM-B-01Y) BI-RADS CATEGORY  1: Negative. Electronically Signed   By: Dina  Arceo M.D.   On: 12/27/2023 14:28       Assessment & Plan:  History of colonic polyps Assessment & Plan: Colonoscopy 11/2019 - diverticulosis.  Recommended f/u colonoscopy in 5 years.    Hypercholesteremia Assessment & Plan: Continue crestor .  Low cholesterol diet and exercise.  Follow lipid panel.  Lab Results  Component Value Date   CHOL 163 06/16/2024   HDL 53.30 06/16/2024   LDLCALC 85 06/16/2024   LDLDIRECT 93.0 06/26/2022   TRIG 124.0 06/16/2024   CHOLHDL 3 06/16/2024     Orders: -     Lipid panel; Future -     Hepatic function panel; Future -     Basic metabolic panel with GFR; Future  Type 2 diabetes mellitus with vascular disease (HCC) Assessment & Plan: Bowels better off metformin .  Did not tolerate jardiance  or rybelsus .  Have discussed monitoring carb intake.  She has been watching her diet. Reviewed sugars - CGM - 06/06/24 - 06/19/24- target range 77% high 22%with very high 1%. (Time active 97%). A1c increased - 7.5% on recent check. Continues on glipizide . Hold on making changes. Follow sugars. Continue diet and exercise. Follow met b and A1c. Appears to be improving - recent checks.   Orders: -     Hemoglobin A1c; Future  Acute nonintractable headache, unspecified headache type -     CBC with Differential/Platelet; Future  History of CVA (cerebrovascular accident) Assessment & Plan: Continue daily aspirin.    Stress Assessment & Plan: Overall appears to be doing well. Follow.    Other orders -     glipiZIDE  ER; Take 1 tablet (5 mg total) by mouth daily with breakfast.  Dispense: 90 tablet; Refill: 1     Allena Hamilton, MD "

## 2024-06-22 ENCOUNTER — Encounter: Payer: Self-pay | Admitting: Internal Medicine

## 2024-06-22 NOTE — Assessment & Plan Note (Signed)
Colonoscopy 11/2019 - diverticulosis.  Recommended f/u colonoscopy in 5 years.

## 2024-06-22 NOTE — Assessment & Plan Note (Signed)
 Continue crestor .  Low cholesterol diet and exercise.  Follow lipid panel.  Lab Results  Component Value Date   CHOL 163 06/16/2024   HDL 53.30 06/16/2024   LDLCALC 85 06/16/2024   LDLDIRECT 93.0 06/26/2022   TRIG 124.0 06/16/2024   CHOLHDL 3 06/16/2024

## 2024-06-22 NOTE — Assessment & Plan Note (Signed)
 Overall appears to be doing well.  Follow.

## 2024-06-22 NOTE — Assessment & Plan Note (Signed)
 Bowels better off metformin .  Did not tolerate jardiance  or rybelsus .  Have discussed monitoring carb intake.  She has been watching her diet. Reviewed sugars - CGM - 06/06/24 - 06/19/24- target range 77% high 22%with very high 1%. (Time active 97%). A1c increased - 7.5% on recent check. Continues on glipizide . Hold on making changes. Follow sugars. Continue diet and exercise. Follow met b and A1c. Appears to be improving - recent checks.

## 2024-06-22 NOTE — Assessment & Plan Note (Signed)
Continue daily aspirin 

## 2024-09-04 ENCOUNTER — Ambulatory Visit: Admitting: Podiatry

## 2024-09-23 ENCOUNTER — Other Ambulatory Visit

## 2024-09-25 ENCOUNTER — Ambulatory Visit: Admitting: Internal Medicine

## 2025-06-23 ENCOUNTER — Ambulatory Visit
